# Patient Record
Sex: Female | Born: 1935
Health system: Southern US, Community
[De-identification: ages and names within clinical notes are randomized; demographics above are authoritative.]

## PROBLEM LIST (undated history)

## (undated) DIAGNOSIS — F419 Anxiety disorder, unspecified: Secondary | ICD-10-CM

## (undated) DIAGNOSIS — M810 Age-related osteoporosis without current pathological fracture: Secondary | ICD-10-CM

## (undated) DIAGNOSIS — K589 Irritable bowel syndrome without diarrhea: Secondary | ICD-10-CM

## (undated) DIAGNOSIS — J9610 Chronic respiratory failure, unspecified whether with hypoxia or hypercapnia: Secondary | ICD-10-CM

## (undated) DIAGNOSIS — I2510 Atherosclerotic heart disease of native coronary artery without angina pectoris: Secondary | ICD-10-CM

## (undated) DIAGNOSIS — I5032 Chronic diastolic (congestive) heart failure: Secondary | ICD-10-CM

## (undated) DIAGNOSIS — I219 Acute myocardial infarction, unspecified: Secondary | ICD-10-CM

## (undated) DIAGNOSIS — Z923 Personal history of irradiation: Secondary | ICD-10-CM

## (undated) DIAGNOSIS — J439 Emphysema, unspecified: Secondary | ICD-10-CM

## (undated) DIAGNOSIS — I480 Paroxysmal atrial fibrillation: Secondary | ICD-10-CM

## (undated) DIAGNOSIS — I10 Essential (primary) hypertension: Secondary | ICD-10-CM

## (undated) DIAGNOSIS — M5430 Sciatica, unspecified side: Secondary | ICD-10-CM

## (undated) DIAGNOSIS — Z8619 Personal history of other infectious and parasitic diseases: Secondary | ICD-10-CM

## (undated) DIAGNOSIS — M549 Dorsalgia, unspecified: Secondary | ICD-10-CM

## (undated) DIAGNOSIS — Z9889 Other specified postprocedural states: Secondary | ICD-10-CM

## (undated) DIAGNOSIS — M1990 Unspecified osteoarthritis, unspecified site: Secondary | ICD-10-CM

## (undated) DIAGNOSIS — Z87898 Personal history of other specified conditions: Secondary | ICD-10-CM

## (undated) DIAGNOSIS — K648 Other hemorrhoids: Secondary | ICD-10-CM

## (undated) DIAGNOSIS — I951 Orthostatic hypotension: Secondary | ICD-10-CM

## (undated) DIAGNOSIS — H3530 Unspecified macular degeneration: Secondary | ICD-10-CM

## (undated) DIAGNOSIS — Z860100 Personal history of colon polyps, unspecified: Secondary | ICD-10-CM

## (undated) DIAGNOSIS — J42 Unspecified chronic bronchitis: Secondary | ICD-10-CM

## (undated) DIAGNOSIS — G8929 Other chronic pain: Secondary | ICD-10-CM

## (undated) DIAGNOSIS — R112 Nausea with vomiting, unspecified: Secondary | ICD-10-CM

## (undated) DIAGNOSIS — I4510 Unspecified right bundle-branch block: Secondary | ICD-10-CM

## (undated) DIAGNOSIS — I671 Cerebral aneurysm, nonruptured: Secondary | ICD-10-CM

## (undated) DIAGNOSIS — K219 Gastro-esophageal reflux disease without esophagitis: Secondary | ICD-10-CM

## (undated) DIAGNOSIS — E785 Hyperlipidemia, unspecified: Secondary | ICD-10-CM

## (undated) DIAGNOSIS — E039 Hypothyroidism, unspecified: Secondary | ICD-10-CM

## (undated) DIAGNOSIS — Z9981 Dependence on supplemental oxygen: Secondary | ICD-10-CM

## (undated) DIAGNOSIS — C801 Malignant (primary) neoplasm, unspecified: Secondary | ICD-10-CM

## (undated) DIAGNOSIS — J189 Pneumonia, unspecified organism: Secondary | ICD-10-CM

## (undated) HISTORY — DX: Gastro-esophageal reflux disease without esophagitis: K21.9

## (undated) HISTORY — DX: Irritable bowel syndrome, unspecified: K58.9

## (undated) HISTORY — DX: Emphysema, unspecified: J43.9

## (undated) HISTORY — DX: Acute myocardial infarction, unspecified: I21.9

## (undated) HISTORY — PX: JOINT REPLACEMENT: SHX530

## (undated) HISTORY — PX: CATARACT EXTRACTION W/ INTRAOCULAR LENS  IMPLANT, BILATERAL: SHX1307

## (undated) HISTORY — DX: Hypothyroidism, unspecified: E03.9

## (undated) HISTORY — DX: Paroxysmal atrial fibrillation: I48.0

## (undated) HISTORY — PX: ESOPHAGOGASTRODUODENOSCOPY: SHX1529

## (undated) HISTORY — PX: CARDIAC CATHETERIZATION: SHX172

## (undated) HISTORY — PX: TONSILLECTOMY AND ADENOIDECTOMY: SUR1326

## (undated) HISTORY — DX: Anxiety disorder, unspecified: F41.9

## (undated) HISTORY — PX: OTHER SURGICAL HISTORY: SHX169

## (undated) HISTORY — PX: LARYNX SURGERY: SHX692

## (undated) HISTORY — DX: Chronic respiratory failure, unspecified whether with hypoxia or hypercapnia: J96.10

## (undated) HISTORY — DX: Atherosclerotic heart disease of native coronary artery without angina pectoris: I25.10

## (undated) HISTORY — DX: Orthostatic hypotension: I95.1

## (undated) HISTORY — DX: Other hemorrhoids: K64.8

## (undated) HISTORY — DX: Unspecified right bundle-branch block: I45.10

---

## 1971-02-06 HISTORY — PX: TUBAL LIGATION: SHX77

## 1984-02-06 HISTORY — PX: TOTAL ABDOMINAL HYSTERECTOMY: SHX209

## 1993-02-05 HISTORY — PX: APPENDECTOMY: SHX54

## 2000-07-05 ENCOUNTER — Inpatient Hospital Stay (HOSPITAL_COMMUNITY): Admission: AD | Admit: 2000-07-05 | Discharge: 2000-07-08 | Payer: Self-pay | Admitting: *Deleted

## 2000-07-08 ENCOUNTER — Encounter: Payer: Self-pay | Admitting: *Deleted

## 2000-08-19 ENCOUNTER — Ambulatory Visit (HOSPITAL_COMMUNITY): Admission: RE | Admit: 2000-08-19 | Discharge: 2000-08-19 | Payer: Self-pay | Admitting: Gastroenterology

## 2000-09-06 ENCOUNTER — Encounter: Payer: Self-pay | Admitting: Gastroenterology

## 2000-09-06 ENCOUNTER — Encounter: Admission: RE | Admit: 2000-09-06 | Discharge: 2000-09-06 | Payer: Self-pay | Admitting: Gastroenterology

## 2002-01-07 ENCOUNTER — Encounter: Admission: RE | Admit: 2002-01-07 | Discharge: 2002-01-07 | Payer: Self-pay | Admitting: Gastroenterology

## 2002-01-07 ENCOUNTER — Encounter: Payer: Self-pay | Admitting: Gastroenterology

## 2002-04-12 ENCOUNTER — Ambulatory Visit (HOSPITAL_COMMUNITY): Admission: RE | Admit: 2002-04-12 | Discharge: 2002-04-12 | Payer: Self-pay | Admitting: Family Medicine

## 2002-04-12 ENCOUNTER — Encounter: Payer: Self-pay | Admitting: Family Medicine

## 2002-05-14 ENCOUNTER — Ambulatory Visit (HOSPITAL_COMMUNITY): Admission: RE | Admit: 2002-05-14 | Discharge: 2002-05-14 | Payer: Self-pay | Admitting: *Deleted

## 2003-04-19 ENCOUNTER — Encounter: Admission: RE | Admit: 2003-04-19 | Discharge: 2003-04-19 | Payer: Self-pay | Admitting: Family Medicine

## 2003-05-01 ENCOUNTER — Emergency Department (HOSPITAL_COMMUNITY): Admission: EM | Admit: 2003-05-01 | Discharge: 2003-05-02 | Payer: Self-pay | Admitting: Emergency Medicine

## 2003-05-19 ENCOUNTER — Ambulatory Visit (HOSPITAL_COMMUNITY): Admission: RE | Admit: 2003-05-19 | Discharge: 2003-05-20 | Payer: Self-pay

## 2003-05-19 ENCOUNTER — Encounter (INDEPENDENT_AMBULATORY_CARE_PROVIDER_SITE_OTHER): Payer: Self-pay | Admitting: Specialist

## 2003-11-23 ENCOUNTER — Emergency Department (HOSPITAL_COMMUNITY): Admission: EM | Admit: 2003-11-23 | Discharge: 2003-11-23 | Payer: Self-pay | Admitting: Emergency Medicine

## 2004-02-02 ENCOUNTER — Encounter: Admission: RE | Admit: 2004-02-02 | Discharge: 2004-02-02 | Payer: Self-pay | Admitting: Specialist

## 2004-04-27 ENCOUNTER — Inpatient Hospital Stay (HOSPITAL_COMMUNITY): Admission: EM | Admit: 2004-04-27 | Discharge: 2004-05-01 | Payer: Self-pay | Admitting: Emergency Medicine

## 2004-08-04 ENCOUNTER — Emergency Department (HOSPITAL_COMMUNITY): Admission: EM | Admit: 2004-08-04 | Discharge: 2004-08-04 | Payer: Self-pay | Admitting: Emergency Medicine

## 2004-08-07 ENCOUNTER — Encounter (HOSPITAL_COMMUNITY): Admission: RE | Admit: 2004-08-07 | Discharge: 2004-09-07 | Payer: Self-pay | Admitting: Family Medicine

## 2004-09-15 ENCOUNTER — Ambulatory Visit: Payer: Self-pay | Admitting: Internal Medicine

## 2004-09-18 ENCOUNTER — Encounter: Admission: RE | Admit: 2004-09-18 | Discharge: 2004-09-18 | Payer: Self-pay | Admitting: Specialist

## 2005-05-31 ENCOUNTER — Encounter: Admission: RE | Admit: 2005-05-31 | Discharge: 2005-05-31 | Payer: Self-pay | Admitting: Specialist

## 2005-10-10 ENCOUNTER — Encounter: Admission: RE | Admit: 2005-10-10 | Discharge: 2005-10-10 | Payer: Self-pay | Admitting: Specialist

## 2005-12-10 ENCOUNTER — Emergency Department (HOSPITAL_COMMUNITY): Admission: EM | Admit: 2005-12-10 | Discharge: 2005-12-10 | Payer: Self-pay | Admitting: Emergency Medicine

## 2006-01-31 ENCOUNTER — Ambulatory Visit: Payer: Self-pay | Admitting: Internal Medicine

## 2006-02-05 HISTORY — PX: CHOLECYSTECTOMY: SHX55

## 2006-02-10 ENCOUNTER — Emergency Department (HOSPITAL_COMMUNITY): Admission: EM | Admit: 2006-02-10 | Discharge: 2006-02-10 | Payer: Self-pay | Admitting: Family Medicine

## 2006-02-28 ENCOUNTER — Ambulatory Visit: Payer: Self-pay | Admitting: Internal Medicine

## 2006-03-26 ENCOUNTER — Encounter: Admission: RE | Admit: 2006-03-26 | Discharge: 2006-03-26 | Payer: Self-pay | Admitting: Specialist

## 2006-08-01 ENCOUNTER — Encounter: Admission: RE | Admit: 2006-08-01 | Discharge: 2006-08-01 | Payer: Self-pay | Admitting: Specialist

## 2006-11-15 ENCOUNTER — Emergency Department (HOSPITAL_COMMUNITY): Admission: EM | Admit: 2006-11-15 | Discharge: 2006-11-15 | Payer: Self-pay | Admitting: Emergency Medicine

## 2006-12-04 ENCOUNTER — Encounter: Admission: RE | Admit: 2006-12-04 | Discharge: 2006-12-04 | Payer: Self-pay | Admitting: Specialist

## 2007-02-06 DIAGNOSIS — I219 Acute myocardial infarction, unspecified: Secondary | ICD-10-CM

## 2007-02-06 HISTORY — DX: Acute myocardial infarction, unspecified: I21.9

## 2007-04-22 ENCOUNTER — Encounter: Admission: RE | Admit: 2007-04-22 | Discharge: 2007-04-22 | Payer: Self-pay | Admitting: Specialist

## 2007-07-15 ENCOUNTER — Observation Stay (HOSPITAL_COMMUNITY): Admission: EM | Admit: 2007-07-15 | Discharge: 2007-07-16 | Payer: Self-pay | Admitting: Emergency Medicine

## 2007-07-17 ENCOUNTER — Encounter: Admission: RE | Admit: 2007-07-17 | Discharge: 2007-07-17 | Payer: Self-pay | Admitting: Interventional Cardiology

## 2007-09-02 ENCOUNTER — Encounter: Admission: RE | Admit: 2007-09-02 | Discharge: 2007-09-02 | Payer: Self-pay | Admitting: Specialist

## 2008-02-26 ENCOUNTER — Ambulatory Visit: Payer: Self-pay | Admitting: Internal Medicine

## 2008-03-11 ENCOUNTER — Encounter: Payer: Self-pay | Admitting: Internal Medicine

## 2008-03-11 ENCOUNTER — Ambulatory Visit: Payer: Self-pay | Admitting: Internal Medicine

## 2008-03-12 ENCOUNTER — Encounter: Payer: Self-pay | Admitting: Internal Medicine

## 2008-07-02 ENCOUNTER — Encounter: Admission: RE | Admit: 2008-07-02 | Discharge: 2008-07-02 | Payer: Self-pay | Admitting: Specialist

## 2008-08-27 ENCOUNTER — Encounter: Admission: RE | Admit: 2008-08-27 | Discharge: 2008-08-27 | Payer: Self-pay | Admitting: Orthopedic Surgery

## 2008-08-27 ENCOUNTER — Emergency Department (HOSPITAL_COMMUNITY): Admission: EM | Admit: 2008-08-27 | Discharge: 2008-08-28 | Payer: Self-pay | Admitting: Emergency Medicine

## 2008-11-01 ENCOUNTER — Inpatient Hospital Stay (HOSPITAL_COMMUNITY): Admission: RE | Admit: 2008-11-01 | Discharge: 2008-11-04 | Payer: Self-pay | Admitting: Orthopedic Surgery

## 2008-11-01 HISTORY — PX: TOTAL HIP ARTHROPLASTY: SHX124

## 2008-11-06 ENCOUNTER — Inpatient Hospital Stay (HOSPITAL_COMMUNITY): Admission: EM | Admit: 2008-11-06 | Discharge: 2008-11-07 | Payer: Self-pay | Admitting: Emergency Medicine

## 2008-11-06 ENCOUNTER — Encounter (INDEPENDENT_AMBULATORY_CARE_PROVIDER_SITE_OTHER): Payer: Self-pay | Admitting: Emergency Medicine

## 2008-11-06 ENCOUNTER — Ambulatory Visit: Payer: Self-pay | Admitting: Vascular Surgery

## 2009-03-08 ENCOUNTER — Ambulatory Visit (HOSPITAL_COMMUNITY): Admission: RE | Admit: 2009-03-08 | Discharge: 2009-03-08 | Payer: Self-pay | Admitting: Internal Medicine

## 2009-06-24 ENCOUNTER — Telehealth: Payer: Self-pay | Admitting: Internal Medicine

## 2009-06-27 ENCOUNTER — Ambulatory Visit: Payer: Self-pay | Admitting: Gastroenterology

## 2009-06-27 DIAGNOSIS — K219 Gastro-esophageal reflux disease without esophagitis: Secondary | ICD-10-CM | POA: Insufficient documentation

## 2009-06-27 DIAGNOSIS — R110 Nausea: Secondary | ICD-10-CM

## 2009-06-27 DIAGNOSIS — R1319 Other dysphagia: Secondary | ICD-10-CM

## 2009-06-27 DIAGNOSIS — Z860100 Personal history of colon polyps, unspecified: Secondary | ICD-10-CM | POA: Insufficient documentation

## 2009-06-27 DIAGNOSIS — R109 Unspecified abdominal pain: Secondary | ICD-10-CM

## 2009-06-27 LAB — CONVERTED CEMR LAB
ALT: 17 units/L (ref 0–35)
Albumin: 3.9 g/dL (ref 3.5–5.2)
BUN: 12 mg/dL (ref 6–23)
Calcium: 9.7 mg/dL (ref 8.4–10.5)
Chloride: 99 meq/L (ref 96–112)
Eosinophils Absolute: 0.1 10*3/uL (ref 0.0–0.7)
Glucose, Bld: 101 mg/dL — ABNORMAL HIGH (ref 70–99)
HCT: 44.2 % (ref 36.0–46.0)
Hemoglobin: 15.2 g/dL — ABNORMAL HIGH (ref 12.0–15.0)
Lymphocytes Relative: 35 % (ref 12.0–46.0)
Lymphs Abs: 2.8 10*3/uL (ref 0.7–4.0)
MCHC: 34.3 g/dL (ref 30.0–36.0)
Neutrophils Relative %: 54.9 % (ref 43.0–77.0)
Platelets: 343 10*3/uL (ref 150.0–400.0)
Potassium: 3.6 meq/L (ref 3.5–5.1)
RDW: 14.9 % — ABNORMAL HIGH (ref 11.5–14.6)
WBC: 8 10*3/uL (ref 4.5–10.5)

## 2009-06-29 ENCOUNTER — Inpatient Hospital Stay (HOSPITAL_COMMUNITY): Admission: EM | Admit: 2009-06-29 | Discharge: 2009-07-01 | Payer: Self-pay | Admitting: Emergency Medicine

## 2009-06-29 ENCOUNTER — Encounter: Payer: Self-pay | Admitting: Nurse Practitioner

## 2009-06-29 ENCOUNTER — Telehealth: Payer: Self-pay | Admitting: Nurse Practitioner

## 2009-06-30 ENCOUNTER — Encounter: Payer: Self-pay | Admitting: Cardiology

## 2009-07-20 ENCOUNTER — Telehealth: Payer: Self-pay | Admitting: Nurse Practitioner

## 2009-07-25 ENCOUNTER — Encounter: Admission: RE | Admit: 2009-07-25 | Discharge: 2009-07-25 | Payer: Self-pay | Admitting: Neurology

## 2009-08-30 ENCOUNTER — Ambulatory Visit: Payer: Self-pay | Admitting: Cardiothoracic Surgery

## 2009-09-19 ENCOUNTER — Encounter: Payer: Self-pay | Admitting: Cardiothoracic Surgery

## 2009-09-21 ENCOUNTER — Inpatient Hospital Stay (HOSPITAL_COMMUNITY)
Admission: RE | Admit: 2009-09-21 | Discharge: 2009-09-28 | Payer: Self-pay | Source: Home / Self Care | Admitting: Cardiothoracic Surgery

## 2009-09-21 ENCOUNTER — Ambulatory Visit: Payer: Self-pay | Admitting: Cardiothoracic Surgery

## 2009-09-21 HISTORY — PX: CORONARY ARTERY BYPASS GRAFT: SHX141

## 2009-09-22 ENCOUNTER — Ambulatory Visit: Payer: Self-pay | Admitting: Cardiology

## 2009-10-17 ENCOUNTER — Encounter: Admission: RE | Admit: 2009-10-17 | Discharge: 2009-10-17 | Payer: Self-pay | Admitting: Cardiothoracic Surgery

## 2009-10-17 ENCOUNTER — Ambulatory Visit: Payer: Self-pay | Admitting: Cardiothoracic Surgery

## 2009-10-24 ENCOUNTER — Ambulatory Visit: Payer: Self-pay | Admitting: Cardiology

## 2009-11-06 ENCOUNTER — Encounter (HOSPITAL_COMMUNITY)
Admission: RE | Admit: 2009-11-06 | Discharge: 2010-02-04 | Payer: Self-pay | Source: Home / Self Care | Attending: Cardiology | Admitting: Cardiology

## 2009-11-14 ENCOUNTER — Encounter: Payer: Self-pay | Admitting: Critical Care Medicine

## 2009-11-14 ENCOUNTER — Encounter: Admission: RE | Admit: 2009-11-14 | Discharge: 2009-11-14 | Payer: Self-pay | Admitting: Cardiology

## 2009-11-14 ENCOUNTER — Ambulatory Visit: Payer: Self-pay | Admitting: Cardiology

## 2009-12-01 ENCOUNTER — Ambulatory Visit: Payer: Self-pay | Admitting: Cardiovascular Disease

## 2009-12-07 ENCOUNTER — Ambulatory Visit: Payer: Self-pay | Admitting: Critical Care Medicine

## 2009-12-07 DIAGNOSIS — J449 Chronic obstructive pulmonary disease, unspecified: Secondary | ICD-10-CM | POA: Insufficient documentation

## 2009-12-07 DIAGNOSIS — I5032 Chronic diastolic (congestive) heart failure: Secondary | ICD-10-CM | POA: Insufficient documentation

## 2009-12-08 ENCOUNTER — Encounter: Payer: Self-pay | Admitting: Critical Care Medicine

## 2009-12-12 ENCOUNTER — Telehealth: Payer: Self-pay | Admitting: Critical Care Medicine

## 2009-12-12 ENCOUNTER — Telehealth (INDEPENDENT_AMBULATORY_CARE_PROVIDER_SITE_OTHER): Payer: Self-pay | Admitting: *Deleted

## 2009-12-13 ENCOUNTER — Encounter: Payer: Self-pay | Admitting: Critical Care Medicine

## 2009-12-16 ENCOUNTER — Telehealth: Payer: Self-pay | Admitting: Critical Care Medicine

## 2009-12-23 ENCOUNTER — Ambulatory Visit: Payer: Self-pay | Admitting: Cardiology

## 2009-12-26 ENCOUNTER — Encounter: Payer: Self-pay | Admitting: Critical Care Medicine

## 2009-12-30 ENCOUNTER — Encounter: Payer: Self-pay | Admitting: Critical Care Medicine

## 2010-01-05 ENCOUNTER — Encounter: Payer: Self-pay | Admitting: Critical Care Medicine

## 2010-01-12 ENCOUNTER — Ambulatory Visit: Payer: Self-pay | Admitting: Cardiology

## 2010-01-18 ENCOUNTER — Ambulatory Visit: Payer: Self-pay | Admitting: Critical Care Medicine

## 2010-01-23 ENCOUNTER — Telehealth (INDEPENDENT_AMBULATORY_CARE_PROVIDER_SITE_OTHER): Payer: Self-pay | Admitting: *Deleted

## 2010-02-05 ENCOUNTER — Encounter (HOSPITAL_COMMUNITY)
Admission: RE | Admit: 2010-02-05 | Discharge: 2010-03-07 | Payer: Self-pay | Source: Home / Self Care | Attending: Cardiology | Admitting: Cardiology

## 2010-02-26 ENCOUNTER — Encounter: Payer: Self-pay | Admitting: Gastroenterology

## 2010-03-08 ENCOUNTER — Encounter (HOSPITAL_COMMUNITY): Admission: RE | Admit: 2010-03-08 | Payer: Medicare Other | Source: Ambulatory Visit

## 2010-03-08 DIAGNOSIS — Z951 Presence of aortocoronary bypass graft: Secondary | ICD-10-CM | POA: Insufficient documentation

## 2010-03-08 DIAGNOSIS — F17200 Nicotine dependence, unspecified, uncomplicated: Secondary | ICD-10-CM | POA: Insufficient documentation

## 2010-03-08 DIAGNOSIS — I4891 Unspecified atrial fibrillation: Secondary | ICD-10-CM | POA: Insufficient documentation

## 2010-03-08 DIAGNOSIS — I200 Unstable angina: Secondary | ICD-10-CM | POA: Insufficient documentation

## 2010-03-08 DIAGNOSIS — Z96649 Presence of unspecified artificial hip joint: Secondary | ICD-10-CM | POA: Insufficient documentation

## 2010-03-08 DIAGNOSIS — Z7982 Long term (current) use of aspirin: Secondary | ICD-10-CM | POA: Insufficient documentation

## 2010-03-08 DIAGNOSIS — I2510 Atherosclerotic heart disease of native coronary artery without angina pectoris: Secondary | ICD-10-CM | POA: Insufficient documentation

## 2010-03-08 DIAGNOSIS — J438 Other emphysema: Secondary | ICD-10-CM | POA: Insufficient documentation

## 2010-03-08 DIAGNOSIS — Z7902 Long term (current) use of antithrombotics/antiplatelets: Secondary | ICD-10-CM | POA: Insufficient documentation

## 2010-03-08 DIAGNOSIS — K219 Gastro-esophageal reflux disease without esophagitis: Secondary | ICD-10-CM | POA: Insufficient documentation

## 2010-03-08 DIAGNOSIS — Z880 Allergy status to penicillin: Secondary | ICD-10-CM | POA: Insufficient documentation

## 2010-03-08 DIAGNOSIS — Z5189 Encounter for other specified aftercare: Secondary | ICD-10-CM | POA: Insufficient documentation

## 2010-03-08 DIAGNOSIS — I671 Cerebral aneurysm, nonruptured: Secondary | ICD-10-CM | POA: Insufficient documentation

## 2010-03-08 DIAGNOSIS — Z882 Allergy status to sulfonamides status: Secondary | ICD-10-CM | POA: Insufficient documentation

## 2010-03-08 DIAGNOSIS — I10 Essential (primary) hypertension: Secondary | ICD-10-CM | POA: Insufficient documentation

## 2010-03-09 NOTE — Progress Notes (Signed)
Summary: ONO low   Phone Note Outgoing Call   Summary of Call: call pt and tell her ONO on RA is abnormal   RA sat 77%   she needs overnite oxygen and with exertion. order sent to [facility] phone note initiated by Dr. Shan Levans on 12/12/09 at 12:00pm.  Follow-up for Phone Call        Pt returned call. Duplicate phone message was taken.  I called pt back and informed her of above per PW.  She was also informed to wear 2L o2 at night and 3L with exertion per orders sent to [facility].  Pt verbalized understanding and is aware order sent to [facility] for this. Follow-up by: Gweneth Dimitri RN,  December 12, 2009 2:43 PM

## 2010-03-09 NOTE — Progress Notes (Signed)
Summary: returning call  Phone Note Call from Patient Call back at Home Phone [phone]   Caller: Patient Call For: wright Summary of Call: Returning phone call. Initial call taken by: Darletta Moll,  December 12, 2009 1:53 PM  Follow-up for Phone Call        Pt was informed of ONO results and recs --- pls see phone note dated from 11.7.11 for additional information. Follow-up by: Gweneth Dimitri RN,  December 12, 2009 2:41 PM

## 2010-03-09 NOTE — Assessment & Plan Note (Signed)
Summary: burning in esophagus/stomach--ch.   History of Present Illness Visit Type: Initial Visit Primary GI MD: Yancey Flemings MD Primary Provider: Buren Kos, MD Requesting Provider: Buren Kos, MD Chief Complaint: Stomach burning History of Present Illness:   Patient is a 76 year old female followed by Dr. Marina Goodell for history of gastritis, colon polyps and constipation. She was last seen Feb. 2010. BMs have actually been doing better since starting Dexilant. Patient took Nexium once daily for three years for history of GERD. Saw PCP recently for several week history of generalized upper abdominal burning and nausea. At that time patient was switched from Nexium, which she took for years, to twice daily Dexilant. Her acid reflux has since improved and she thinks her nausea and upper abdominal burning have improved as well. No medication changes except for PPI.  Doesn't take NSAIDs. No black stools.  Complains of recent swallowing problems. Occasionally gets choked on meat. Recently had to extract some cole slaw. Pills seem to be getting stuck in her esophagus as well.    GI Review of Systems    Reports acid reflux, chest pain, heartburn, and  nausea.      Denies abdominal pain, belching, bloating, dysphagia with liquids, dysphagia with solids, loss of appetite, vomiting, vomiting blood, weight loss, and  weight gain.      Reports diverticulosis and  irritable bowel syndrome.     Denies anal fissure, black tarry stools, change in bowel habit, constipation, diarrhea, fecal incontinence, heme positive stool, hemorrhoids, jaundice, light color stool, liver problems, rectal bleeding, and  rectal pain. Preventive Screening-Counseling & Management  Alcohol-Tobacco     Smoking Status: current      Drug Use:  no.     Current Medications (verified): 1)  Hydrochlorothiazide 25 Mg Tabs (Hydrochlorothiazide) .[name].. 1 By Mouth Once Daily 2)  Dexilant 60 Mg Cpdr (Dexlansoprazole) .[name].. 1 By Mouth Two  Times A Day 3)  Fish Oil  Oil (Fish Oil) .[name].. 1 By Mouth Two Times A Day 4)  Calcium Citrate +  Tabs (Multiple Minerals-Vitamins) .[name].. 1 By Mouth Once Daily  Allergies (verified): 1)  ! Sulfa 2)  ! Penicillin 3)  ! Pyridium 4)  ! Vicodin 5)  ! 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  5 ciggs per day Alcohol Use - no Daily Caffeine Use  dr pepper  2 cups coffee per day Illicit Drug Use - no Smoking Status:  current Drug Use:  no  Review of Systems       The patient complains of allergy/sinus, anxiety-new, arthritis/joint pain, cough, fatigue, heart murmur, shortness of breath, sore throat, swelling of feet/legs, and urination changes/pain.  The patient denies anemia, back pain, blood in urine, breast changes/lumps, change in vision, confusion, coughing up blood, depression-new, fever, headaches-new, hearing problems, heart rhythm changes, itching, menstrual pain, muscle pains/cramps, night sweats, nosebleeds, pregnancy symptoms, skin rash, sleeping problems, swollen lymph glands, thirst - excessive , urination - excessive , urine leakage, vision changes, and voice change.    Vital Signs:  Patient profile:   76 year old female Height:  66 inches Weight:      154 pounds BMI:     24.95 BSA:     1.79 Pulse rate:   84 / minute Pulse rhythm:   regular BP sitting:   126 / 72  (left arm)  Vitals Entered By: Merri Ray CMA Duncan Dull) (Jun 27, 2009 10:36 AM)  Physical Exam  General:   Well developed, well nourished, no acute distress. Head:  Normocephalic and atraumatic. Eyes:  Conjunctiva pink, no icterus.  Mouth:  No oral lesions. Tongue moist.  Neck:  no obvious masses  Lungs:  Clear throughout to auscultation. Heart:  Regular rate and rhythm; no murmurs, rubs,  or bruits. Abdomen:  Abdomen soft, nondistended. Mild epigastric tenderness.  No obvious masses or hepatomegaly.Normal bowel sounds.  Msk:  Symmetrical with no gross deformities. Normal posture. Neurologic:  Alert and  oriented x4;  grossly normal neurologically. Skin:  Intact without significant lesions or rashes. Cervical Nodes:  No significant cervical adenopathy. Psych:  Alert and cooperative. Normal mood and affect.  Impression & Recommendations:  Problem # 1:  ABDOMINAL PAIN-EPIGASTRIC (ICD-789.06) Assessment Deteriorated Several week history of upper abdominal burning and nausea.  Improvement after switching from daily Nexium to twice daily Dexilant but out of the samples PCP gave her. Not sure why suddenly developed these symptoms few weeks ago. Doubt PUD. Symptoms do not sound typical for biliary disease and she has history of a cholecystectomy.  Will give patient prescription for Dexilant since it does seem to help. Will also give Zofran to use if needed. Obtain some basic labs today. If symptoms do not improve patient may need further workup (e.g. gastric emptying scan, EGD, etc...). It should be noted however that EGD (for epigastric pain and melena) in 2008 was negative except for mild acute gastritis.   Orders: TLB-CBC Platelet - w/Differential (85025-CBCD) TLB-CMP (Comprehensive Metabolic Pnl) (80053-COMP)  Problem # 2:  PERSONAL HX COLONIC POLYPS (ICD-V12.72) Tubular adenoma 2007  Problem # 3:  GERD (ICD-530.81) Recently switched to twice daily Dexilant. Some improvement in GERD symptoms.   Problem # 4:  NAUSEA (ICD-787.02) See #1.  Orders: TLB-CBC Platelet - w/Differential  (85025-CBCD) TLB-CMP (Comprehensive Metabolic Pnl) (80053-COMP)  Problem # 5:  DYSPHAGIA (ICD-787.29) Intermittent solid food dysphagia. May be secondary to esophagitis, dysmotility. Once GERD symptoms optimally controlled will reassess swallowing problems and proceed with EGD with dilation if indicated.  Other Orders: Ultrasound Abdomen (UAS)  Patient Instructions: 1)  Your physician has requested that you have the following labwork done today: Go to lab basement level. 2)  We sent a perscription for Zofran for nausea and Dexilant, samples given. 3)  Copy sent to: Buren Kos, MD 4)  The medication list was reviewed and reconciled.  All changed / newly prescribed medications were explained.  A complete medication list was provided to the patient / caregiver.  Prescriptions: DEXILANT 60 MG CPDR (DEXLANSOPRAZOLE) Take 1 tab twice daily, 30 min prior to breakfast and dinner  #60 x 2   Entered by:   Lowry Ram NCMA   Authorized by:   Willette Cluster NP   Signed by:   Lowry Ram NCMA on 06/27/2009   Method used:   Electronically to        [facility] [phone]* (retail)       [address]       [place], [place]  [address]       Ph: [phone]  Fax: [phone]   RxID:   [id number] ZOFRAN 4 MG TABS (ONDANSETRON HCL) Take 1 tab every 6 hours as needed for nausea  #40 x 0   Entered by:   Lowry Ram NCMA   Authorized by:   Willette Cluster NP   Signed by:   Lowry Ram NCMA on 06/27/2009   Method used:   Electronically to        [facility]. [phone]* (retail)       [address]       [place], [place]  [address]       Ph: [phone]       Fax: [phone]   RxID:   [id number]

## 2010-03-09 NOTE — Medication Information (Signed)
Summary: Orders / [facility] [place]  Orders / [facility]   Imported By: Lennie Odor 01/02/2010 12:28:34  _____________________________________________________________________  External Attachment:    Type:   Image     Comment:   External Document

## 2010-03-09 NOTE — Miscellaneous (Signed)
Summary: ONO on RA  Clinical Lists Changes  Observations: Added new observation of SLEEP STUDY: Low Oxygen Sat: 77% Oximetry overnight on          RA        =   11 min < 88%  (12/08/2009 9:01)      Sleep Study  Procedure date:  12/08/2009  Findings:      Low Oxygen Sat: 77% Oximetry overnight on          RA        =   11 min < 88%

## 2010-03-09 NOTE — Assessment & Plan Note (Signed)
Summary: Pulmonary Consultation   Visit Type:  Initial Consult Copy to:  Neva Seat Primary Provider/Referring Provider:  Buren Kos, MD  CC:  Pulmonary consult... and COPD initial evaluation.  History of Present Illness: Pulmonary Consultation       This is a 76 year old woman who presents for COPD initial evaluation.  The patient complains of history of diagnosed COPD, shortness of breath, wheezing, mucous production, and exercise induced symptoms, but denies chest tightness, chest pain worse with breathing and coughing, cough, nocturnal awakening, and congestion.  Prior evaluation and testing has included Cxr and Complete PFT.  The dyspnea is described as at rest, with ADL's, with walking within room, and during the day.  Associated disease(s) include(s) coronary artery disease, indigestion, and hand/feet swelling.  Oxygen evaluation is described as not on supplemental O2.  Prior effective treatment has included short acting beta agonists.    this pt has been dyspneic  since 09/2009.  The pt then had a  cabg and now is worse.  There is no real cough. The pt has been Dx with copd since 2010.  Only rx up to now has been as needed SABA.  Pt denies any significant sore throat, nasal congestion or excess secretions, fever, chills, sweats, unintended weight loss, pleurtic or exertional chest pain, orthopnea PND, or leg swelling Pt denies any increase in rescue therapy over baseline, denies waking up needing it or having any early am or nocturnal exacerbations of coughing/wheezing/or dyspnea. Pt also denies any obvious fluctuation in symptoms wiht weather or environmental change or other alleviating or aggravating factors   Preventive Screening-Counseling & Management  Alcohol-Tobacco     Year Started: 1961     Year Quit: 2011     Pack years: 20  Current Medications (verified): 1)  Fish Oil  Oil (Fish Oil) .[name].. 1 By Mouth Two Times A Day 2)  Calcium Citrate +  Tabs (Multiple  Minerals-Vitamins) .[name].. 1 By Mouth Once Daily 3)  Levothyroxine Sodium 25 Mcg Tabs (Levothyroxine Sodium) .[name].. 1 By Mouth Daily 4)  Furosemide 40 Mg Tabs (Furosemide) .[name].. 1 By Mouth Daily 5)  Klor-Con M20 20 Meq Cr-Tabs (Potassium Chloride Crys Cr) .[name].. 1 By Mouth Daily 6)  Aspirin 81 Mg Tabs (Aspirin) .[name].. 1 By Mouth Daily 7)  Nexium 40 Mg Cpdr (Esomeprazole Magnesium) .[name].. 1 By Mouth Daily 8)  Metoprolol Tartrate 25 Mg Tabs (Metoprolol Tartrate) .[name].. 1 By Mouth Two Times A Day 9)  Dulcolax Stool Softener 100 Mg Caps (Docusate Sodium) .... As Needed  Allergies (verified): 1)  ! Sulfa 2)  ! Penicillin 3)  ! Pyridium 4)  ! Vicodin 5)  ! 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 Family History of Breast Cancer: sister  brain tumor Family History of Colon Polyps:brother No FH of Colon Cancer: Family History MI/Heart Attack---father  Social History: Occupation: retired Engineer, civil (consulting) Alcohol Use - no Daily Caffeine Use  dr pepper  2 cups coffee per day Illicit Drug Use - no Pack years:  20  Review of Systems       The patient complains of shortness of breath with activity, shortness of breath at rest, productive cough, difficulty  swallowing, nasal congestion/difficulty breathing through nose, and hand/feet swelling.  The patient denies non-productive cough, coughing up blood, chest pain, irregular heartbeats, acid heartburn, indigestion, loss of appetite, weight change, abdominal pain, sore throat, tooth/dental problems, headaches, sneezing, itching, ear ache, anxiety, depression, joint  stiffness or pain, rash, change in color of mucus, and fever.        See HPI for Pulmonary, Cardiac, General, and ENT review of systems.  Vital Signs:  Patient profile:   76 year old female Height:      66 inches (167.64 cm) Weight:      153 pounds (69.55 kg) BMI:     24.78 O2 Sat:      98 % on Room air Temp:     97.6 degrees F (36.44 degrees C) oral Pulse rate:   89 / minute BP sitting:   104 / 68  (left arm) Cuff size:   regular  Vitals Entered By: Michel Bickers CMA (December 07, 2009 11:10 AM)  O2 Sat at Rest %:  98 O2 Flow:  Room air  Serial Vital Signs/Assessments:  Comments: 12:21 PM Ambulatory Pulse Oximetry  Resting; HR__83___    02 Sat_97% on room air____  Lap1 (185 feet)   HR__101___   02 Sat__90% on room air at 1/2 lap; patient had to sit and rest and c/o sob and dizziness; pt's sats were at 87% after 1 full lap and before oxygen could be started her sats were back to 96%.___ Lap2 (185 feet)   HR_____   02 Sat_____    Lap3 (185 feet)   HR_____   02 Sat_____  ___Test Completed without Difficulty ___Test Stopped due to:  By: Michel Bickers CMA   CC: Pulmonary consult..., COPD initial evaluation Comments Medications reviewed with patient Michel Bickers CMA  December 07, 2009 11:10 AM   Physical Exam  Additional Exam:  Gen: Pleasant, thin , WF in no distress,  normal affect ENT: No lesions,  mouth clear,  oropharynx clear, no postnasal drip Neck: No JVD, no TMG, no carotid bruits Lungs: No use of accessory muscles, no dullness to percussion,distant BS, poor airflow Cardiovascular: RRR, heart sounds normal, no murmur or gallops, no peripheral edema Abdomen: soft and NT, no HSM,  BS normal Musculoskeletal: No deformities, no cyanosis or clubbing Neuro: alert, non focal Skin: Warm, no lesions or rashes    Impression & Recommendations:  Problem # 1:  COPD (ICD-496) Assessment Deteriorated COPD with primary emphysematous component and low DLCO but well  preserved FeV1, pt with exertional desaturation and likely nocturnal desaturation plan Start Spiriva daily check ONO on RA as needed SABA may yet need oxygen at night and with exertion  Medications Added to Medication List This Visit: 1)  Levothyroxine Sodium 25 Mcg Tabs (Levothyroxine sodium) .[name].. 1 by mouth daily 2)  Furosemide 40 Mg Tabs (Furosemide) .[name].. 1 by mouth daily 3)  Klor-con M20 20 Meq Cr-tabs (Potassium chloride crys cr) .[name].. 1 by mouth daily 4)  Aspirin 81 Mg Tabs (Aspirin) .[name].. 1 by mouth daily 5)  Nexium 40 Mg Cpdr (Esomeprazole magnesium) .[name].. 1 by mouth daily 6)  Metoprolol Tartrate 25 Mg Tabs (Metoprolol tartrate) .[name].. 1 by mouth two times a day 7)  Dulcolax Stool Softener 100 Mg Caps (Docusate sodium) .... As needed 8)  Proair Hfa 108 (90 Base) Mcg/act Aers (Albuterol sulfate) .[name].. 1-2 puffs every 4-6 hours as needed 9)  Spiriva Handihaler 18 Mcg Caps (Tiotropium bromide monohydrate) .... Two puffs in handihaler  daily  Complete Medication List: 1)  Fish Oil Oil (Fish oil) .[name].. 1 by mouth two times a day 2)  Calcium Citrate + Tabs (Multiple minerals-vitamins) .[name].. 1 by mouth once daily 3)  Levothyroxine Sodium 25 Mcg Tabs (Levothyroxine sodium) .[name].. 1 by mouth daily 4)  Furosemide 40 Mg Tabs (Furosemide) .[name].. 1 by mouth daily 5)  Klor-con M20 20 Meq Cr-tabs (Potassium chloride crys cr) .[name].. 1 by mouth daily 6)  Aspirin 81 Mg Tabs (Aspirin) .[name].. 1 by mouth daily 7)  Nexium 40 Mg Cpdr (Esomeprazole magnesium) .[name].. 1 by mouth daily 8)  Metoprolol Tartrate 25 Mg Tabs (Metoprolol tartrate) .[name].. 1 by mouth two times a day 9)  Dulcolax Stool Softener 100 Mg Caps (Docusate sodium) .... As needed 10)  Proair Hfa 108 (90 Base) Mcg/act Aers (Albuterol sulfate) .[name].. 1-2 puffs every 4-6 hours as needed 11)  Spiriva Handihaler 18 Mcg Caps (Tiotropium bromide monohydrate) .... Two puffs in handihaler daily  Other Orders: Pulse Oximetry, Ambulatory ([address]) New Patient Level V  ([address]) DME Referral (DME)  Patient Instructions: 1)  Spiriva two puffs , one capsule daily 2)  Proair as needed 3)  An overnight sleep oximetry will be obtained. 4)  Return 6 weeks for recheck Prescriptions: PROAIR HFA 108 (90 BASE) MCG/ACT  AERS (ALBUTEROL SULFATE) 1-2 puffs every 4-6 hours as needed  #1 x 6   Entered and Authorized by:   Storm Frisk MD   Signed by:   Storm Frisk MD on 12/07/2009   Method used:   Electronically to        [facility]. [phone]* (retail)       [address]       [place], [place]  [address]       Ph: [phone]       Fax: [phone]   RxID:   [id number] SPIRIVA HANDIHALER 18 MCG  CAPS (TIOTROPIUM BROMIDE MONOHYDRATE) Two puffs in handihaler daily  #30 x 6   Entered and Authorized by:   Storm Frisk MD   Signed by:   Storm Frisk MD on 12/07/2009   Method used:   Electronically to        [facility]. [phone]* (retail)       [address]       [place], [place]  [address]       Ph: [phone]       Fax: [phone]   RxID:   [id number]   Appended Document: Pulmonary Consultation fax Eric Form

## 2010-03-09 NOTE — Progress Notes (Signed)
Summary: Triage  Phone Note Call from Patient Call back at Home Phone [phone]   Caller: Patient Call For: Dr. Marina Goodell Reason for Call: Talk to Nurse Summary of Call: pt. has an appt on 08-10-09 and requesting a sooner appt. She is taking Dexilant. She is having burning in esophagus and stomach Initial call taken by: Karna Christmas,  Jun 24, 2009 9:20 AM  Follow-up for Phone Call        Appt. changed to Monday morning with NP Follow-up by: Teryl Lucy RN,  Jun 24, 2009 10:25 AM

## 2010-03-09 NOTE — Progress Notes (Signed)
Summary: ? sprivia instructions  Phone Note From Pharmacy   Caller: [facility] [address] [phone] Call For: wright  Summary of Call: [facility] questioning sprivia directions.  Rx said 2 puffs a day, [facility] saying instructions never come like that.  Should it be 1 or 2 puffs a day? Initial call taken by: Lehman Prom,  January 23, 2010 1:07 PM  Follow-up for Phone Call        called and spoke with pharmacist and clarified directions of spiriva.  Aundra Millet Reynolds LPN  January 23, 2010 1:14 PM     New/Updated Medications: SPIRIVA HANDIHALER 18 MCG  CAPS (TIOTROPIUM BROMIDE MONOHYDRATE) Inhale contents of 1 capsule once a day

## 2010-03-09 NOTE — Miscellaneous (Signed)
Summary: [facility]   [facility]   Imported By: Lennie Odor 12/20/2009 12:03:12  _____________________________________________________________________  External Attachment:    Type:   Image     Comment:   External Document

## 2010-03-09 NOTE — Miscellaneous (Signed)
Summary: Dexilant 60 mg  Clinical Lists Changes  Medications: Changed medication from DEXILANT 60 MG CPDR (DEXLANSOPRAZOLE) Take 1 tab twice daily, 30 min prior to breakfast and dinner to DEXILANT 60 MG CPDR (DEXLANSOPRAZOLE) Take 1 tab once daily, 30 min prior to breakfast - Signed Rx of DEXILANT 60 MG CPDR (DEXLANSOPRAZOLE) Take 1 tab once daily, 30 min prior to breakfast;  #30 x 2;  Signed;  Entered by: Lowry Ram NCMA;  Authorized by: Willette Cluster NP;  Method used: Electronically to [facility]. [phone]*, [address], [place], [place], [place]  [address], Ph: [phone], Fax: [phone]    Prescriptions: DEXILANT 60 MG CPDR (DEXLANSOPRAZOLE) Take 1 tab once daily, 30 min prior to breakfast  #30 x 2   Entered by:   Lowry Ram NCMA   Authorized by:   Willette Cluster NP   Signed by:   Lowry Ram NCMA on 06/29/2009   Method used:   Electronically to        [facility] [phone]* (retail)       [address]       [place], [place]  [address]       Ph: [phone]       Fax: [phone]   RxID:   [id number]

## 2010-03-09 NOTE — Miscellaneous (Signed)
Summary: PFTs  Clinical Lists Changes  Observations: Added new observation of PFT COMMENTS: Minimal peripheral airflow obstruction, moderate reduction in DLCO consistent with emphysema, mild restriction in lung volumes, no response to bronchodilators (12/08/2009 9:55) Added new observation of DLCO/VA%EXP: 80 % (12/08/2009 9:55) Added new observation of DLCO/VA: 4.05 % (12/08/2009 9:55) Added new observation of DLCO % EXPEC: 50 % (12/08/2009 9:55) Added new observation of DLCO: 13.74 % (12/08/2009 9:55) Added new observation of RV % EXPECT: 82 % (12/08/2009 9:55) Added new observation of RV: 1.91 L (12/08/2009 9:55) Added new observation of TLC % EXPECT: 77 % (12/08/2009 9:55) Added new observation of TLC: 4.14 L (12/08/2009 9:55) Added new observation of FEF2575%EXPS: 68 % (12/08/2009 9:55) Added new observation of PSTFEF25/75P: 2.35  (12/08/2009 9:55) Added new observation of PSTFEF25/75%: 1.60 L/min (12/08/2009 9:55) Added new observation of FEV1FVCPRDPS: 76 % (12/08/2009 9:55) Added new observation of PSTFEV1/FVC: 79 % (12/08/2009 9:55) Added new observation of POSTFEV1%PRD: 83 % (12/08/2009 9:55) Added new observation of FEV1PRDPST: 1.77 L (12/08/2009 9:55) Added new observation of POST FEV1: 1.78 L/min (12/08/2009 9:55) Added new observation of POST FVC%EXP: 74 % (12/08/2009 9:55) Added new observation of FVCPRDPST: 3.01 L/min (12/08/2009 9:55) Added new observation of POST FVC: 2.25 L (12/08/2009 9:55) Added new observation of FEF % EXPEC: 64 % (12/08/2009 9:55) Added new observation of FEF25-75%PRE: 2.35 L/min (12/08/2009 9:55) Added new observation of FEF 25-75%: 1.51 L/min (12/08/2009 9:55) Added new observation of FEV1/FVC PRE: 76 % (12/08/2009 9:55) Added new observation of FEV1/FVC: 77 % (12/08/2009 9:55) Added new observation of FEV1 % EXP: 82 % (12/08/2009 9:55) Added new observation of FEV1 PREDICT: 2.14 L (12/08/2009 9:55) Added new observation of FEV1: 1.77 L  (12/08/2009 9:55) Added new observation of FVC % EXPECT: 76 % (12/08/2009 9:55) Added new observation of FVC PREDICT: 3.01 L (12/08/2009 9:55) Added new observation of FVC: 2.30 L (12/08/2009 9:55) Added new observation of PFT DATE: 03/08/2009  (12/08/2009 9:55)      Pulmonary Function Test Date: 03/08/2009 Height (in.): 66 Gender: Female  Pre-Spirometry FVC    Value: 2.30 L/min   Pred: 3.01 L/min     % Pred: 76 % FEV1    Value: 1.77 L     Pred: 2.14 L     % Pred: 82 % FEV1/FVC  Value: 77 %     Pred: 76 %    FEF 25-75  Value: 1.51 L/min   Pred: 2.35 L/min     % Pred: 64 %  Post-Spirometry FVC    Value: 2.25 L/min   Pred: 3.01 L/min     % Pred: 74 % FEV1    Value: 1.78 L     Pred: 1.77 L     % Pred: 83 % FEV1/FVC  Value: 79 %     Pred: 76 %    FEF 25-75  Value: 1.60 L/min   Pred: 2.35 L/min     % Pred: 68 %  Lung Volumes TLC    Value: 4.14 L   % Pred: 77 % RV    Value: 1.91 L   % Pred: 82 % DLCO    Value: 13.74 %   % Pred: 50 % DLCO/VA  Value: 4.05 %   % Pred: 80 %  Comments: Minimal peripheral airflow obstruction, moderate reduction in DLCO consistent with emphysema, mild restriction in lung volumes, no response to bronchodilators

## 2010-03-09 NOTE — Miscellaneous (Signed)
Summary: Echocardiogram  Clinical Lists Changes  Observations: Added new observation of ECHOINTERP: LVEF 55-60% RV systolic function normal Systolic RV pressure normal (06/30/2009 10:00)      Echocardiogram  Procedure date:  06/30/2009  Findings:      LVEF 55-60% RV systolic function normal Systolic RV pressure normal Peak PAP 

## 2010-03-09 NOTE — Progress Notes (Signed)
Summary: Cx ultrasound and update  Phone Note Outgoing Call Call back at [facility] Phone [phone]   Call placed by: Harlow Mares CMA Duncan Dull),  July 20, 2009 3:41 PM Call placed to: Patient Summary of Call: called pt and she states that she spoke with Gunnar Fusi and was advised since she was feeling better there was no need to have the ultrasound. There was no phone note of the conversation so I will forward this message to the [place]. Patient also wanted Gunnar Fusi to know that the day after she last spoke to [place] her had to go to the hospital with chest pain and she had a heart attack and is doing fine now.  Initial call taken by: Harlow Mares CMA Duncan Dull),  July 20, 2009 3:45 PM  Follow-up for Phone Call        Follow up call to patient. She is doing well from GERD standpoint on Dexilant. She has occasional angina relieved by Nitroglycerin. Patient will call for any recurrent GERD symptoms. Dexilant was approved by insurance, hopefully they will continue to pay. Follow-up by: Willette Cluster NP,  July 28, 2009 12:20 PM

## 2010-03-09 NOTE — Letter (Signed)
Summary: [facility]   Imported By: Sherian Rein 12/12/2009 13:36:11  _____________________________________________________________________  External Attachment:    Type:   Image     Comment:   External Document

## 2010-03-09 NOTE — Assessment & Plan Note (Signed)
Summary: Pulmonary OV   Copy to:  Neva Seat Primary Oza Oberle/Referring Vira Chaplin:  Buren Kos, MD  CC:  6 wk followup.  Pt states that her breathing has improved some since last seen.  She stopped the spiriva due to hoarsness.  No new complaints today.[name]  History of Present Illness: Pulmonary Consultation       This is a 76 year old woman who presents for COPD initial evaluation.  The patient complains of history of diagnosed COPD, shortness of breath, wheezing, mucous production, and exercise induced symptoms, but denies chest tightness, chest pain worse with breathing and coughing, cough, nocturnal awakening, and congestion.  Prior evaluation and testing has included Cxr and Complete PFT.  The dyspnea is described as at rest, with ADL's, with walking within room, and during the day.  Associated disease(s) include(s) coronary artery disease, indigestion, and hand/feet swelling.  Oxygen evaluation is described as not on supplemental O2.  Prior effective treatment has included short acting beta agonists.    this pt has been dyspneic  since 09/2009.  The pt then had a  cabg and now is worse.  There is no real cough. The pt has been Dx with copd since 2010.  Only rx up to now has been as needed SABA.  Pt denies any significant sore throat, nasal congestion or excess secretions, fever, chills, sweats, unintended weight loss, pleurtic or exertional chest pain, orthopnea PND, or leg swelling Pt denies any increase in rescue therapy over baseline, denies waking up needing it or having any early am or nocturnal exacerbations of coughing/wheezing/or dyspnea. Pt also denies any obvious fluctuation in symptoms wiht weather or environmental change or other alleviating or aggravating factors   January 18, 2010 1:52 PM The pt is now  on spiriva daily and oxygen therapy. The pt lives in a townhouse, now can walk without the oxygen. Pt likes the spiriva as far as lungs are concerned. Pt got hoarse on  spiriva but is taking too fast a breath on the inhaler    Preventive Screening-Counseling & Management  Alcohol-Tobacco     Smoking Status: current     Year Started: 1961     Year Quit: 2011     Pack years: 20  Current Medications (verified): 1)  Fish Oil 1000 Mg Caps (Omega-3 Fatty Acids) .[name].. 1 Three Times A Day 2)  Calcium Citrate +  Tabs (Multiple Minerals-Vitamins) .[name].. 1 By Mouth Once Daily 3)  Levothyroxine Sodium 25 Mcg Tabs (Levothyroxine Sodium) .[name].. 1 By Mouth Daily 4)  Furosemide 40 Mg Tabs (Furosemide) .[name].. 1 Two Times A Day 5)  Klor-Con M20 20 Meq Cr-Tabs (Potassium Chloride Crys Cr) .[name].. 1 By Mouth Daily 6)  Aspirin 81 Mg Tabs (Aspirin) .[name].. 1 By Mouth Daily 7)  Nexium 40 Mg Cpdr (Esomeprazole Magnesium) .[name].. 1 By Mouth Daily 8)  Metoprolol Tartrate 25 Mg Tabs (Metoprolol Tartrate) .[name].. 1 By Mouth Two Times A Day 9)  Dulcolax Stool Softener 100 Mg Caps (Docusate Sodium) .... As Needed 10)  Proair Hfa 108 (90 Base) Mcg/act  Aers (Albuterol Sulfate) .[name].. 1-2 Puffs Every 4-6 Hours As Needed 11)  Spiriva Handihaler 18 Mcg  Caps (Tiotropium Bromide Monohydrate) .... Two Puffs in Handihaler Daily 12)  Oxygen .... 2 Liters At Bedtime and 3 Liters With Exertion During The Day As Needed  Allergies (verified): 1)  ! Sulfa 2)  ! Penicillin 3)  ! Pyridium 4)  ! Morphine 5)  ! Prednisone 6)  ! * Pneumovax  Past History:  Past medical, surgical, family and social histories (including risk factors) reviewed, and no changes noted (except as noted below).  Past Medical History: Reviewed history from 12/07/2009 and no changes required. Current Problems:  CHF (ICD-428.0) DYSPHAGIA (ICD-787.29) GERD (ICD-530.81) PERSONAL HX COLONIC POLYPS (ICD-V12.72) Hx of atrial fibrilliation  Past Surgical History: Reviewed history from 12/07/2009 and no changes required. Appendectomy 1955 Cholecystectomy 2008 Hysterectomy  1986 Hip replacement (right) 11/01/2008  nodulant tumor  larynx  1963 Heart bypass  09/21/2009  Family History: Reviewed history from 12/07/2009 and no changes required. Family History of Breast Cancer: sister  brain tumor Family History of Colon Polyps:brother No FH of Colon Cancer: Family History MI/Heart Attack---father  Social History: Reviewed history from 12/07/2009 and no changes required. Occupation: retired Engineer, civil (consulting) Alcohol Use - no Daily Caffeine Use  dr pepper  2 cups coffee per day Illicit Drug Use - no  Review of Systems       The patient complains of shortness of breath with activity.  The patient denies shortness of breath at rest, productive cough, non-productive cough, coughing up blood, chest pain, irregular heartbeats, acid heartburn, indigestion, loss of appetite, weight change, abdominal pain, difficulty swallowing, sore throat, tooth/dental problems, headaches, nasal congestion/difficulty breathing through nose, sneezing, itching, ear ache, anxiety, depression, hand/feet swelling, joint stiffness or pain, rash, change in color of mucus, and fever.    Vital Signs:  Patient profile:   76 year old female Weight:      156 pounds O2 Sat:      91 % on Room air Temp:     97.4 degrees F oral Pulse rate:   89 / minute BP sitting:   120 / 82  (left arm)  Vitals Entered By: Vernie Murders (January 18, 2010 1:31 PM)  O2 Flow:  Room air  Physical Exam  Additional Exam:  Gen: Pleasant, thin , WF in no distress,  normal affect ENT: No lesions,  mouth clear,  oropharynx clear, no postnasal drip Neck: No JVD, no TMG, no carotid bruits Lungs: No use of accessory muscles, no dullness to percussion,distant BS, poor airflow Cardiovascular: RRR, heart sounds normal, no murmur or gallops, no peripheral edema Abdomen: soft and NT, no HSM,  BS normal Musculoskeletal: No deformities, no cyanosis or clubbing Neuro: alert, non focal Skin: Warm, no lesions or rashes    Impression & Recommendations:  Problem # 1:  COPD  (ICD-496) Assessment Unchanged  COPD with primary emphysematous component and low DLCO but well preserved FeV1, pt with exertional desaturation and likely nocturnal desaturation plan cont oxygen and spiriva slow down intake on spiriva   Medications Added to Medication List This Visit: 1)  Fish Oil 1000 Mg Caps (Omega-3 fatty acids) .[name].. 1 three times a day 2)  Furosemide 40 Mg Tabs (Furosemide) .[name].. 1 two times a day 3)  Oxygen  .... 2 liters at bedtime and 3 liters with exertion during the day as needed  Complete Medication List: 1)  Fish Oil 1000 Mg Caps (Omega-3 fatty acids) .[name].. 1 three times a day 2)  Calcium Citrate + Tabs (Multiple minerals-vitamins) .[name].. 1 by mouth once daily 3)  Levothyroxine Sodium 25 Mcg Tabs (Levothyroxine sodium) .[name].. 1 by mouth daily 4)  Furosemide 40 Mg Tabs (Furosemide) .[name].. 1 two times a day 5)  Klor-con M20 20 Meq Cr-tabs (Potassium chloride crys cr) .[name].. 1 by mouth daily 6)  Aspirin 81 Mg Tabs (Aspirin) .[name].. 1 by mouth daily 7)  Nexium 40 Mg  Cpdr (Esomeprazole magnesium) .[name].. 1 by mouth daily 8)  Metoprolol Tartrate 25 Mg Tabs (Metoprolol tartrate) .[name].. 1 by mouth two times a day 9)  Dulcolax Stool Softener 100 Mg Caps (Docusate sodium) .... As needed 10)  Proair Hfa 108 (90 Base) Mcg/act Aers (Albuterol sulfate) .[name].. 1-2 puffs every 4-6 hours as needed 11)  Spiriva Handihaler 18 Mcg Caps (Tiotropium bromide monohydrate) .... Two puffs in handihaler daily 12)  Oxygen  .... 2 liters at bedtime and 3 liters with exertion during the day as needed  Other Orders: Est. Patient Level III ([address])  Patient Instructions: 1)  Use spiriva daily, remember to use the device with a slow deep breath 2)  Stay on oxygen as you are currently using 3)  Return 3 months  Appended Document: Pulmonary OV doug shaw fax

## 2010-03-09 NOTE — Progress Notes (Signed)
Summary: O2 questions  Phone Note Call from Patient Call back at [facility] Phone [phone]   Caller: Patient Call For: Renee [name] Summary of Call: Has questions about her O2. Initial call taken by: Darletta Moll,  December 16, 2009 4:09 PM  Follow-up for Phone Call        called and spoke with pt and she just wanted to review the correct way to use the oxygen---i explained to her per PW orders for DME---2 liters at night and 3 liters with exertion.  pt voiced her understanding of this [address] [place]  December 16, 2009 4:19 PM

## 2010-03-09 NOTE — Progress Notes (Signed)
Summary: Dexilant  Phone Note Outgoing Call   Call placed by: Joselyn Glassman,  Jun 29, 2009 10:27 AM Call placed to: Insurer Summary of Call: I spoke to [employer].  Yesterday 06-28-09 I tried to do PA for twice daily dosage of Dexilant 60 Mg.  They approved it but for 30 MG.  I  called today and wanted to redo the PA for once daily, 60 MG.  The rep I spoke to said I really didn't have to do a PA on it.  Her plan would cover 60 MG  for 90 capsules in a 150 day period of time.  I will let pt know she can get the Dexilant for now.   Initial call taken by: Joselyn Glassman,  Jun 29, 2009 10:29 AM

## 2010-03-10 ENCOUNTER — Encounter (HOSPITAL_COMMUNITY): Payer: Medicare Other

## 2010-03-10 NOTE — Letter (Signed)
Summary: CMN for Oxygen / [facility]  CMN for Oxygen / [facility]   Imported By: Lennie Odor 01/05/2010 15:32:57  _____________________________________________________________________  External Attachment:    Type:   Image     Comment:   External Document

## 2010-03-10 NOTE — Letter (Signed)
Summary: [facility]N for Oxygen / [facility]  CMN for Oxygen / [facility]   Imported By: Lennie Odor 01/11/2010 10:42:32  _____________________________________________________________________  External Attachment:    Type:   Image     Comment:   External Document

## 2010-03-13 ENCOUNTER — Encounter (HOSPITAL_COMMUNITY): Payer: Medicare Other

## 2010-03-13 ENCOUNTER — Encounter: Payer: Self-pay | Admitting: Critical Care Medicine

## 2010-03-13 ENCOUNTER — Ambulatory Visit (INDEPENDENT_AMBULATORY_CARE_PROVIDER_SITE_OTHER): Payer: Medicare Other | Admitting: Critical Care Medicine

## 2010-03-13 DIAGNOSIS — J018 Other acute sinusitis: Secondary | ICD-10-CM

## 2010-03-13 DIAGNOSIS — J449 Chronic obstructive pulmonary disease, unspecified: Secondary | ICD-10-CM

## 2010-03-15 ENCOUNTER — Encounter (HOSPITAL_COMMUNITY): Payer: Medicare Other

## 2010-03-17 ENCOUNTER — Encounter (HOSPITAL_COMMUNITY): Payer: Medicare Other

## 2010-03-20 ENCOUNTER — Encounter (HOSPITAL_COMMUNITY): Payer: Medicare Other

## 2010-03-22 ENCOUNTER — Encounter (HOSPITAL_COMMUNITY): Payer: Medicare Other

## 2010-03-23 NOTE — Assessment & Plan Note (Signed)
Summary: Pulmonary OV   Visit Type:  Follow-up Copy to:  Neva Seat Primary Provider/Referring Provider:  Buren Kos, MD  CC:  COPD.  pt c/o incr SOB due to anxiety...pt hit the left side of her head on 03/06/2010 and c/o headaches.  History of Present Illness: Pulmonary OV       This is a 76 year old woman who presents for COPD    this pt has been dyspneic  since 09/2009.  The pt then had a  cabg and now is worse.  There is no real cough. The pt has been Dx with copd since 2010.  Only rx up to now has been as needed SABA.  Pt denies any significant sore throat, nasal congestion or excess secretions, fever, chills, sweats, unintended weight loss, pleurtic or exertional chest pain, orthopnea PND, or leg swelling Pt denies any increase in rescue therapy over baseline, denies waking up needing it or having any early am or nocturnal exacerbations of coughing/wheezing/or dyspnea. Pt also denies any obvious fluctuation in symptoms wiht weather or environmental change or other alleviating or aggravating factors   January 18, 2010 1:52 PM The pt is now  on spiriva daily and oxygen therapy. The pt lives in a townhouse, now can walk without the oxygen. Pt likes the spiriva as far as lungs are concerned. Pt got hoarse on spiriva but is taking too fast a breath on the inhaler  March 13, 2010 11:48 AM Pt is improved.  Pt notes some soreness in the throat.  There is no cough. THere is no excess mucus.  The spiriva causes hoarseness. Pt denies any significant sore throat, nasal congestion or excess secretions, fever, chills, sweats, unintended weight loss, pleurtic or exertional chest pain, orthopnea PND, or leg swelling Pt denies any increase in rescue therapy over baseline, denies waking up needing it or having any early am or nocturnal exacerbations of coughing/wheezing/or dyspnea.   Preventive Screening-Counseling & Management  Alcohol-Tobacco     Smoking Status: quit     Packs/Day:  0.75     Year Started: 1960     Year Quit: August 2011  Current Medications (verified): 1)  Fish Oil 1000 Mg Caps (Omega-3 Fatty Acids) .[name].. 1 Three Times A Day 2)  Calcium Citrate +  Tabs (Multiple Minerals-Vitamins) .[name].. 1 By Mouth Once Daily 3)  Levothyroxine Sodium 25 Mcg Tabs (Levothyroxine Sodium) .[name].. 1 By Mouth Daily 4)  Furosemide 40 Mg Tabs (Furosemide) .[name].. 1 Two Times A Day 5)  Klor-Con M20 20 Meq Cr-Tabs (Potassium Chloride Crys Cr) .[name].. 1 By Mouth Daily 6)  Aspirin 81 Mg Tabs (Aspirin) .[name].. 1 By Mouth Daily 7)  Nexium 40 Mg Cpdr (Esomeprazole Magnesium) .[name].. 1 By Mouth Daily 8)  Metoprolol Tartrate 25 Mg Tabs (Metoprolol Tartrate) .[name].. 1 By Mouth Two Times A Day 9)  Dulcolax Stool Softener 100 Mg Caps (Docusate Sodium) .... As Needed 10)  Proair Hfa 108 (90 Base) Mcg/act  Aers (Albuterol Sulfate) .[name].. 1-2 Puffs Every 4-6 Hours As Needed 11)  Spiriva Handihaler 18 Mcg  Caps (Tiotropium Bromide Monohydrate) .... Inhale Contents of 1 Capsule Once A Day 12)  Oxygen .... 2 Liters At Bedtime and 3 Liters With Exertion During The Day As Needed  Allergies (verified): 1)  ! Sulfa 2)  ! Penicillin 3)  ! Pyridium 4)  ! Morphine 5)  ! Prednisone 6)  ! * Pneumovax  Past History:  Past medical, surgical, family and social histories (including risk factors)  reviewed, and no changes noted (except as noted below).  Past Medical History: Reviewed history from 12/07/2009 and no changes required. Current Problems:  CHF (ICD-428.0) DYSPHAGIA (ICD-787.29) GERD (ICD-530.81) PERSONAL HX COLONIC POLYPS (ICD-V12.72) Hx of atrial fibrilliation  Past Surgical History: Reviewed history from 12/07/2009 and no changes required. Appendectomy 1955 Cholecystectomy 2008 Hysterectomy  1986 Hip replacement (right) 11/01/2008  nodulant tumor larynx  1963 Heart bypass  09/21/2009  Clinical Reports Reviewed:  PFT's:  12/08/2009: DLCO %Predicted:  50 FEF 25/75 %Predicted:  64 FEV1  %Predicted:  82 FVC %Predicted:  76 Post Spirometry FEF 25/75 %Predicted:  68 Post Spirometry FEV1 %Predicted:  83 Post Spirometry FVC %Predicted:  74 RV %Predicted:  82 TLC %Predicted:  77   Family History: Reviewed history from 12/07/2009 and no changes required. Family History of Breast Cancer: sister  brain tumor Family History of Colon Polyps:brother No FH of Colon Cancer: Family History MI/Heart Attack---father  Social History: Reviewed history from 12/07/2009 and no changes required. Occupation: retired Engineer, civil (consulting) Alcohol Use - no Daily Caffeine Use  dr pepper  2 cups coffee per day Illicit Drug Use - no Smoking Status:  quit Packs/Day:  0.75  Review of Systems       The patient complains of shortness of breath with activity.  The patient denies shortness of breath at rest, productive cough, non-productive cough, coughing up blood, chest pain, irregular heartbeats, acid heartburn, indigestion, loss of appetite, weight change, abdominal pain, difficulty swallowing, sore throat, tooth/dental problems, headaches, nasal congestion/difficulty breathing through nose, sneezing, itching, ear ache, anxiety, depression, hand/feet swelling, joint stiffness or pain, rash, change in color of mucus, and fever.    Vital Signs:  Patient profile:   76 year old female Height:      66 inches (167.64 cm) Weight:      157.13 pounds (71.42 kg) BMI:     25.45 O2 Sat:      98 % on Room air Temp:     97.9 degrees F (36.61 degrees C) oral Pulse rate:   68 / minute BP sitting:   106 / 64  (left arm) Cuff size:   regular  Vitals Entered By: Michel Bickers CMA (March 13, 2010 11:24 AM)  O2 Sat at Rest %:  98 O2 Flow:  Room air CC: COPD.  pt c/o incr SOB due to anxiety...pt hit the left side of her head on 03/06/2010 and c/o headaches Comments Medications reviewed with patient Michel Bickers CMA  March 13, 2010 11:33 AM   Physical Exam  Additional Exam:  Gen: Pleasant, thin , WF in no  distress,  normal affect ENT: No lesions,  mouth clear,  oropharynx clear, no postnasal drip Neck: No JVD, no TMG, no carotid bruits Lungs: No use of accessory muscles, no dullness to percussion,distant BS, poor airflow Cardiovascular: RRR, heart sounds normal, no murmur or gallops, no peripheral edema Abdomen: soft and NT, no HSM,  BS normal Musculoskeletal: No deformities, no cyanosis or clubbing Neuro: alert, non focal Skin: Warm, no lesions or rashes    Impression & Recommendations:  Problem # 1:  COPD (ICD-496) Assessment Unchanged COPD with primary emphysematous component and low DLCO but well preserved FeV1, pt with exertional desaturation and likely nocturnal desaturation plan cont oxygen and spiriva  Medications Added to Medication List This Visit: 1)  Azithromycin 250 Mg Tabs (Azithromycin) .... Take two by mouth times one dose then one daily until gone 2)  Nasonex 50 Mcg/act Susp (Mometasone furoate) .Marland KitchenMarland KitchenMarland Kitchen  Two puffs each nostril daily take until sample gone  Complete Medication List: 1)  Fish Oil 1000 Mg Caps (Omega-3 fatty acids) .[name].. 1 three times a day 2)  Calcium Citrate + Tabs (Multiple minerals-vitamins) .[name].. 1 by mouth once daily 3)  Levothyroxine Sodium 25 Mcg Tabs (Levothyroxine sodium) .[name].. 1 by mouth daily 4)  Furosemide 40 Mg Tabs (Furosemide) .[name].. 1 two times a day 5)  Klor-con M20 20 Meq Cr-tabs (Potassium chloride crys cr) .[name].. 1 by mouth daily 6)  Aspirin 81 Mg Tabs (Aspirin) .[name].. 1 by mouth daily 7)  Nexium 40 Mg Cpdr (Esomeprazole magnesium) .[name].. 1 by mouth daily 8)  Metoprolol Tartrate 25 Mg Tabs (Metoprolol tartrate) .[name].. 1 by mouth two times a day 9)  Dulcolax Stool Softener 100 Mg Caps (Docusate sodium) .... As needed 10)  Proair Hfa 108 (90 Base) Mcg/act Aers (Albuterol sulfate) .[name].. 1-2 puffs every 4-6 hours as needed 11)  Spiriva Handihaler 18 Mcg Caps (Tiotropium bromide monohydrate) .... Inhale contents of 1 capsule once a day 12)  Oxygen  ....  2 liters at bedtime and 3 liters with exertion during the day as needed 13)  Azithromycin 250 Mg Tabs (Azithromycin) .... Take two by mouth times one dose then one daily until gone 14)  Nasonex 50 Mcg/act Susp (Mometasone furoate) .... Two puffs each nostril daily take until sample gone  Other Orders: Est. Patient Level IV (29562) Surgical Referral (Surgery) Rehabilitation Referral (Rehab)  Patient Instructions: 1)  I will ask Dr Tyrone Sage to see you for your sternal wound pain 2)  Pulmonary rehab will be ordered 3)  zpak will be sent to the pharmacy 4)  Nasonex two sprays each nostril daily until samples gone 5)  No change in medications 6)  Return in     4     months Prescriptions: AZITHROMYCIN 250 MG TABS (AZITHROMYCIN) take two by mouth times one dose then one daily until gone  #7 x 0   Entered and Authorized by:   Storm Frisk MD   Signed by:   Storm Frisk MD on 03/13/2010   Method used:   Electronically to        [facility]. [phone]* (retail)       [address]       [place], [place]  [address]       Ph: [phone]       Fax: [phone]   RxID:   [id number]    Immunization History:  Influenza Immunization History:    Influenza:  historical (10/06/2009)  Pneumovax Immunization History:    Pneumovax:  historical (02/06/2007)   Appended Document: Pulmonary OV PT with mild sinus inflammation>>>rx zpak. Refer back to pulm rehab Pt wiht soreness over ant chest at site of cabg sternotomy>>>refer back to Dr Tyrone Sage   fax Dr Tyrone Sage and Eric Form

## 2010-03-24 ENCOUNTER — Encounter (HOSPITAL_COMMUNITY): Payer: Medicare Other

## 2010-03-27 ENCOUNTER — Encounter (HOSPITAL_COMMUNITY): Payer: Medicare Other

## 2010-03-29 ENCOUNTER — Other Ambulatory Visit: Payer: Self-pay | Admitting: Cardiothoracic Surgery

## 2010-03-29 ENCOUNTER — Encounter (HOSPITAL_COMMUNITY): Payer: Medicare Other

## 2010-03-29 DIAGNOSIS — I2510 Atherosclerotic heart disease of native coronary artery without angina pectoris: Secondary | ICD-10-CM

## 2010-03-30 ENCOUNTER — Ambulatory Visit (INDEPENDENT_AMBULATORY_CARE_PROVIDER_SITE_OTHER): Payer: Medicare Other | Admitting: Cardiothoracic Surgery

## 2010-03-30 ENCOUNTER — Ambulatory Visit
Admission: RE | Admit: 2010-03-30 | Discharge: 2010-03-30 | Disposition: A | Discharge status: Home / Self Care | Payer: Medicare Other | Source: Ambulatory Visit | Attending: Cardiothoracic Surgery | Admitting: Cardiothoracic Surgery

## 2010-03-30 DIAGNOSIS — I2510 Atherosclerotic heart disease of native coronary artery without angina pectoris: Secondary | ICD-10-CM

## 2010-03-30 DIAGNOSIS — R071 Chest pain on breathing: Secondary | ICD-10-CM

## 2010-03-31 ENCOUNTER — Encounter: Payer: Self-pay | Admitting: Critical Care Medicine

## 2010-03-31 ENCOUNTER — Encounter (HOSPITAL_COMMUNITY): Payer: Medicare Other

## 2010-03-31 NOTE — Assessment & Plan Note (Addendum)
OFFICE VISIT  Renee [name], Renee [name] DOB:  08-01-1934                                        March 30, 2010 CHART #:  [id number]  The patient underwent coronary artery bypass grafting off-pump x2 on September 21, 2009.  She has known significant underlying pulmonary disease and was initially treated medically, but because of refractory symptoms underwent coronary artery bypass grafting which she tolerated reasonably well, which was done on September 21, 2009.  Since that time, she has had continued deterioration in her pulmonary function and is followed by Dr. Delford Field.  She is now on oxygen at night and p.r.n.  She comes to the office today because of some complaint of increasing sensitivity of the lower neck just above the sternal notch.  PHYSICAL EXAMINATION:  On exam, her blood pressure is 125/82, pulse is 88, respiratory rate is 16, O2 sats 97%.  On exam, her breath sounds are distant.  She has no active wheezing.  Examination of the sternum reveals the bone to be intact without movement.  She has a very small keloid at the very upper portion of her sternal incision with some wound contracture with area of tent skin moving up the midline of her neck from the incision, but was not previously incision.  She has hyperesthesias in this area.  Follow up chest x-ray done today shows the sternal wires all to be intact and without movement.  IMPRESSION:  The patient with small upper sternal keloid with some contracture of the skin extending up into the neck with increasing discomfort.  At this point, I have discussed with her, a referral to Plastic Surgery to evaluate her situation and potentially re-excision of the scar with other possible maneuvers to prevent further keloid formation.  At this point, the patient's sternum itself is stable and is not the cause of her hyperesthesias.  Sheliah Plane, MD Electronically Signed  EG/MEDQ  D:  03/30/2010  T:   03/30/2010  Job:  [id number]  cc:   Kari Baars, MD Peter M. [place], MD Yaakov Guthrie Shon Hough, M.D.

## 2010-04-03 ENCOUNTER — Encounter (HOSPITAL_COMMUNITY): Payer: Medicare Other

## 2010-04-05 ENCOUNTER — Encounter (HOSPITAL_COMMUNITY): Payer: Medicare Other

## 2010-04-06 ENCOUNTER — Encounter (HOSPITAL_COMMUNITY): Payer: Self-pay

## 2010-04-07 ENCOUNTER — Encounter (HOSPITAL_COMMUNITY): Payer: Self-pay

## 2010-04-10 ENCOUNTER — Encounter (HOSPITAL_COMMUNITY): Payer: Self-pay

## 2010-04-11 ENCOUNTER — Encounter (HOSPITAL_COMMUNITY): Payer: Self-pay

## 2010-04-12 ENCOUNTER — Encounter (HOSPITAL_COMMUNITY): Payer: Self-pay

## 2010-04-13 ENCOUNTER — Encounter (HOSPITAL_COMMUNITY): Payer: Self-pay

## 2010-04-14 ENCOUNTER — Encounter (HOSPITAL_COMMUNITY): Payer: Self-pay

## 2010-04-17 ENCOUNTER — Encounter (HOSPITAL_COMMUNITY): Payer: Self-pay

## 2010-04-18 ENCOUNTER — Encounter (HOSPITAL_COMMUNITY): Payer: Self-pay | Attending: Critical Care Medicine

## 2010-04-18 NOTE — Miscellaneous (Signed)
Summary: Order/Pulmonary/[facility]  Order/Pulmonary/[facility]   Imported By: Lester [place] 04/14/2010 10:45:09  _____________________________________________________________________  External Attachment:    Type:   Image     Comment:   External Document

## 2010-04-19 ENCOUNTER — Encounter (HOSPITAL_COMMUNITY): Payer: Self-pay

## 2010-04-20 ENCOUNTER — Encounter (HOSPITAL_COMMUNITY): Payer: Self-pay

## 2010-04-20 LAB — CBC
HCT: 31.7 % — ABNORMAL LOW (ref 36.0–46.0)
HCT: 33.4 % — ABNORMAL LOW (ref 36.0–46.0)
HCT: 34.3 % — ABNORMAL LOW (ref 36.0–46.0)
HCT: 34.8 % — ABNORMAL LOW (ref 36.0–46.0)
HCT: 35.2 % — ABNORMAL LOW (ref 36.0–46.0)
HCT: 35.4 % — ABNORMAL LOW (ref 36.0–46.0)
HCT: 35.8 % — ABNORMAL LOW (ref 36.0–46.0)
HCT: 44.2 % (ref 36.0–46.0)
Hemoglobin: 10.1 g/dL — ABNORMAL LOW (ref 12.0–15.0)
Hemoglobin: 10.9 g/dL — ABNORMAL LOW (ref 12.0–15.0)
Hemoglobin: 11.1 g/dL — ABNORMAL LOW (ref 12.0–15.0)
Hemoglobin: 11.3 g/dL — ABNORMAL LOW (ref 12.0–15.0)
Hemoglobin: 11.4 g/dL — ABNORMAL LOW (ref 12.0–15.0)
Hemoglobin: 11.6 g/dL — ABNORMAL LOW (ref 12.0–15.0)
Hemoglobin: 11.6 g/dL — ABNORMAL LOW (ref 12.0–15.0)
Hemoglobin: 14.9 g/dL (ref 12.0–15.0)
MCH: 28.9 pg (ref 26.0–34.0)
MCH: 29.3 pg (ref 26.0–34.0)
MCH: 29.3 pg (ref 26.0–34.0)
MCH: 29.4 pg (ref 26.0–34.0)
MCH: 29.4 pg (ref 26.0–34.0)
MCH: 29.7 pg (ref 26.0–34.0)
MCH: 30.2 pg (ref 26.0–34.0)
MCH: 30.8 pg (ref 26.0–34.0)
MCHC: 31.8 g/dL (ref 30.0–36.0)
MCHC: 31.9 g/dL (ref 30.0–36.0)
MCHC: 32.4 g/dL (ref 30.0–36.0)
MCHC: 32.4 g/dL (ref 30.0–36.0)
MCHC: 32.5 g/dL (ref 30.0–36.0)
MCHC: 32.8 g/dL (ref 30.0–36.0)
MCHC: 33.2 g/dL (ref 30.0–36.0)
MCHC: 33.7 g/dL (ref 30.0–36.0)
MCV: 89 fL (ref 78.0–100.0)
MCV: 90.4 fL (ref 78.0–100.0)
MCV: 90.5 fL (ref 78.0–100.0)
MCV: 90.5 fL (ref 78.0–100.0)
MCV: 90.8 fL (ref 78.0–100.0)
MCV: 91.3 fL (ref 78.0–100.0)
MCV: 92.4 fL (ref 78.0–100.0)
MCV: 92.5 fL (ref 78.0–100.0)
Platelets: 178 10*3/uL (ref 150–400)
Platelets: 193 10*3/uL (ref 150–400)
Platelets: 195 10*3/uL (ref 150–400)
Platelets: 217 10*3/uL (ref 150–400)
Platelets: 232 10*3/uL (ref 150–400)
Platelets: 235 10*3/uL (ref 150–400)
Platelets: 239 10*3/uL (ref 150–400)
Platelets: 298 10*3/uL (ref 150–400)
RBC: 3.43 MIL/uL — ABNORMAL LOW (ref 3.87–5.11)
RBC: 3.68 MIL/uL — ABNORMAL LOW (ref 3.87–5.11)
RBC: 3.71 MIL/uL — ABNORMAL LOW (ref 3.87–5.11)
RBC: 3.89 MIL/uL (ref 3.87–5.11)
RBC: 3.91 MIL/uL (ref 3.87–5.11)
RBC: 3.91 MIL/uL (ref 3.87–5.11)
RBC: 3.96 MIL/uL (ref 3.87–5.11)
RBC: 4.84 MIL/uL (ref 3.87–5.11)
RDW: 14 % (ref 11.5–15.5)
RDW: 14.1 % (ref 11.5–15.5)
RDW: 14.2 % (ref 11.5–15.5)
RDW: 14.3 % (ref 11.5–15.5)
RDW: 14.6 % (ref 11.5–15.5)
RDW: 14.7 % (ref 11.5–15.5)
RDW: 15 % (ref 11.5–15.5)
RDW: 15.1 % (ref 11.5–15.5)
WBC: 10.7 10*3/uL — ABNORMAL HIGH (ref 4.0–10.5)
WBC: 10.8 10*3/uL — ABNORMAL HIGH (ref 4.0–10.5)
WBC: 11.6 10*3/uL — ABNORMAL HIGH (ref 4.0–10.5)
WBC: 6.6 10*3/uL (ref 4.0–10.5)
WBC: 7.9 10*3/uL (ref 4.0–10.5)
WBC: 8.6 10*3/uL (ref 4.0–10.5)
WBC: 9.5 10*3/uL (ref 4.0–10.5)
WBC: 9.6 10*3/uL (ref 4.0–10.5)

## 2010-04-20 LAB — GLUCOSE, CAPILLARY
Glucose-Capillary: 100 mg/dL — ABNORMAL HIGH (ref 70–99)
Glucose-Capillary: 102 mg/dL — ABNORMAL HIGH (ref 70–99)
Glucose-Capillary: 104 mg/dL — ABNORMAL HIGH (ref 70–99)
Glucose-Capillary: 104 mg/dL — ABNORMAL HIGH (ref 70–99)
Glucose-Capillary: 110 mg/dL — ABNORMAL HIGH (ref 70–99)
Glucose-Capillary: 113 mg/dL — ABNORMAL HIGH (ref 70–99)
Glucose-Capillary: 114 mg/dL — ABNORMAL HIGH (ref 70–99)
Glucose-Capillary: 114 mg/dL — ABNORMAL HIGH (ref 70–99)
Glucose-Capillary: 114 mg/dL — ABNORMAL HIGH (ref 70–99)
Glucose-Capillary: 115 mg/dL — ABNORMAL HIGH (ref 70–99)
Glucose-Capillary: 117 mg/dL — ABNORMAL HIGH (ref 70–99)
Glucose-Capillary: 118 mg/dL — ABNORMAL HIGH (ref 70–99)
Glucose-Capillary: 122 mg/dL — ABNORMAL HIGH (ref 70–99)
Glucose-Capillary: 124 mg/dL — ABNORMAL HIGH (ref 70–99)
Glucose-Capillary: 124 mg/dL — ABNORMAL HIGH (ref 70–99)
Glucose-Capillary: 133 mg/dL — ABNORMAL HIGH (ref 70–99)
Glucose-Capillary: 133 mg/dL — ABNORMAL HIGH (ref 70–99)
Glucose-Capillary: 136 mg/dL — ABNORMAL HIGH (ref 70–99)
Glucose-Capillary: 138 mg/dL — ABNORMAL HIGH (ref 70–99)
Glucose-Capillary: 140 mg/dL — ABNORMAL HIGH (ref 70–99)
Glucose-Capillary: 141 mg/dL — ABNORMAL HIGH (ref 70–99)
Glucose-Capillary: 143 mg/dL — ABNORMAL HIGH (ref 70–99)
Glucose-Capillary: 73 mg/dL (ref 70–99)
Glucose-Capillary: 82 mg/dL (ref 70–99)
Glucose-Capillary: 82 mg/dL (ref 70–99)
Glucose-Capillary: 82 mg/dL (ref 70–99)
Glucose-Capillary: 82 mg/dL (ref 70–99)
Glucose-Capillary: 82 mg/dL (ref 70–99)
Glucose-Capillary: 82 mg/dL (ref 70–99)
Glucose-Capillary: 82 mg/dL (ref 70–99)
Glucose-Capillary: 82 mg/dL (ref 70–99)
Glucose-Capillary: 82 mg/dL (ref 70–99)
Glucose-Capillary: 82 mg/dL (ref 70–99)
Glucose-Capillary: 82 mg/dL (ref 70–99)
Glucose-Capillary: 82 mg/dL (ref 70–99)
Glucose-Capillary: 82 mg/dL (ref 70–99)
Glucose-Capillary: 82 mg/dL (ref 70–99)
Glucose-Capillary: 86 mg/dL (ref 70–99)
Glucose-Capillary: 95 mg/dL (ref 70–99)
Glucose-Capillary: 95 mg/dL (ref 70–99)
Glucose-Capillary: 97 mg/dL (ref 70–99)
Glucose-Capillary: 99 mg/dL (ref 70–99)

## 2010-04-20 LAB — POCT I-STAT 3, ART BLOOD GAS (G3+)
Acid-Base Excess: 2 mmol/L (ref 0.0–2.0)
Acid-base deficit: 2 mmol/L (ref 0.0–2.0)
Acid-base deficit: 6 mmol/L — ABNORMAL HIGH (ref 0.0–2.0)
Bicarbonate: 19.5 mEq/L — ABNORMAL LOW (ref 20.0–24.0)
Bicarbonate: 22.8 mEq/L (ref 20.0–24.0)
Bicarbonate: 24.9 mEq/L — ABNORMAL HIGH (ref 20.0–24.0)
Bicarbonate: 27.9 mEq/L — ABNORMAL HIGH (ref 20.0–24.0)
O2 Saturation: 100 %
O2 Saturation: 95 %
O2 Saturation: 96 %
O2 Saturation: 98 %
Patient temperature: 32.7
Patient temperature: 34.2
Patient temperature: 99.1
TCO2: 21 mmol/L (ref 0–100)
TCO2: 24 mmol/L (ref 0–100)
TCO2: 26 mmol/L (ref 0–100)
TCO2: 29 mmol/L (ref 0–100)
pCO2 arterial: 31.8 mmHg — ABNORMAL LOW (ref 35.0–45.0)
pCO2 arterial: 34.3 mmHg — ABNORMAL LOW (ref 35.0–45.0)
pCO2 arterial: 42.6 mmHg (ref 35.0–45.0)
pCO2 arterial: 50.4 mmHg — ABNORMAL HIGH (ref 35.0–45.0)
pH, Arterial: 7.349 — ABNORMAL LOW (ref 7.350–7.400)
pH, Arterial: 7.351 (ref 7.350–7.400)
pH, Arterial: 7.377 (ref 7.350–7.400)
pH, Arterial: 7.445 — ABNORMAL HIGH (ref 7.350–7.400)
pO2, Arterial: 417 mmHg — ABNORMAL HIGH (ref 80.0–100.0)
pO2, Arterial: 65 mmHg — ABNORMAL LOW (ref 80.0–100.0)
pO2, Arterial: 81 mmHg (ref 80.0–100.0)
pO2, Arterial: 92 mmHg (ref 80.0–100.0)

## 2010-04-20 LAB — BASIC METABOLIC PANEL
BUN: 7 mg/dL (ref 6–23)
BUN: 7 mg/dL (ref 6–23)
BUN: 8 mg/dL (ref 6–23)
BUN: 8 mg/dL (ref 6–23)
BUN: 8 mg/dL (ref 6–23)
BUN: 9 mg/dL (ref 6–23)
CO2: 25 mEq/L (ref 19–32)
CO2: 25 mEq/L (ref 19–32)
CO2: 25 mEq/L (ref 19–32)
CO2: 28 mEq/L (ref 19–32)
CO2: 29 mEq/L (ref 19–32)
CO2: 30 mEq/L (ref 19–32)
Calcium: 8.3 mg/dL — ABNORMAL LOW (ref 8.4–10.5)
Calcium: 8.4 mg/dL (ref 8.4–10.5)
Calcium: 8.4 mg/dL (ref 8.4–10.5)
Calcium: 8.5 mg/dL (ref 8.4–10.5)
Calcium: 8.5 mg/dL (ref 8.4–10.5)
Chloride: 102 mEq/L (ref 96–112)
Chloride: 103 mEq/L (ref 96–112)
Chloride: 103 mEq/L (ref 96–112)
Chloride: 104 mEq/L (ref 96–112)
Chloride: 107 mEq/L (ref 96–112)
Chloride: 109 mEq/L (ref 96–112)
Creatinine, Ser: 0.74 mg/dL (ref 0.4–1.2)
Creatinine, Ser: 0.76 mg/dL (ref 0.4–1.2)
Creatinine, Ser: 0.76 mg/dL (ref 0.4–1.2)
Creatinine, Ser: 0.92 mg/dL (ref 0.4–1.2)
Creatinine, Ser: 0.92 mg/dL (ref 0.4–1.2)
GFR calc Af Amer: >60 mL/min (ref >60)
GFR calc Af Amer: >60 mL/min (ref >60)
GFR calc Af Amer: >60 mL/min (ref >60)
GFR calc Af Amer: >60 mL/min (ref >60)
GFR calc Af Amer: >60 mL/min (ref >60)
GFR calc non Af Amer: >60 mL/min (ref >60)
GFR calc non Af Amer: >60 mL/min (ref >60)
GFR calc non Af Amer: >60 mL/min (ref >60)
GFR calc non Af Amer: >60 mL/min (ref >60)
GFR calc non Af Amer: >60 mL/min (ref >60)
Glucose, Bld: 100 mg/dL — ABNORMAL HIGH (ref 70–99)
Glucose, Bld: 109 mg/dL — ABNORMAL HIGH (ref 70–99)
Glucose, Bld: 121 mg/dL — ABNORMAL HIGH (ref 70–99)
Glucose, Bld: 129 mg/dL — ABNORMAL HIGH (ref 70–99)
Glucose, Bld: 130 mg/dL — ABNORMAL HIGH (ref 70–99)
Glucose, Bld: 139 mg/dL — ABNORMAL HIGH (ref 70–99)
Potassium: 3.4 mEq/L — ABNORMAL LOW (ref 3.5–5.1)
Potassium: 3.7 mEq/L (ref 3.5–5.1)
Potassium: 3.7 mEq/L (ref 3.5–5.1)
Potassium: 3.9 mEq/L (ref 3.5–5.1)
Potassium: 4 mEq/L (ref 3.5–5.1)
Potassium: 4.5 mEq/L (ref 3.5–5.1)
Sodium: 134 mEq/L — ABNORMAL LOW (ref 135–145)
Sodium: 138 mEq/L (ref 135–145)
Sodium: 138 mEq/L (ref 135–145)
Sodium: 138 mEq/L (ref 135–145)
Sodium: 139 mEq/L (ref 135–145)
Sodium: 139 mEq/L (ref 135–145)

## 2010-04-20 LAB — POCT I-STAT 4, (NA,K, GLUC, HGB,HCT)
Glucose, Bld: 111 mg/dL — ABNORMAL HIGH (ref 70–99)
Glucose, Bld: 85 mg/dL (ref 70–99)
Glucose, Bld: 91 mg/dL (ref 70–99)
Glucose, Bld: 98 mg/dL (ref 70–99)
Glucose, Bld: 99 mg/dL (ref 70–99)
HCT: 31 % — ABNORMAL LOW (ref 36.0–46.0)
HCT: 33 % — ABNORMAL LOW (ref 36.0–46.0)
HCT: 33 % — ABNORMAL LOW (ref 36.0–46.0)
HCT: 34 % — ABNORMAL LOW (ref 36.0–46.0)
HCT: 35 % — ABNORMAL LOW (ref 36.0–46.0)
Hemoglobin: 10.5 g/dL — ABNORMAL LOW (ref 12.0–15.0)
Hemoglobin: 11.2 g/dL — ABNORMAL LOW (ref 12.0–15.0)
Hemoglobin: 11.2 g/dL — ABNORMAL LOW (ref 12.0–15.0)
Hemoglobin: 11.6 g/dL — ABNORMAL LOW (ref 12.0–15.0)
Hemoglobin: 11.9 g/dL — ABNORMAL LOW (ref 12.0–15.0)
Potassium: 2.9 mEq/L — ABNORMAL LOW (ref 3.5–5.1)
Potassium: 3.1 mEq/L — ABNORMAL LOW (ref 3.5–5.1)
Potassium: 3.2 mEq/L — ABNORMAL LOW (ref 3.5–5.1)
Potassium: 3.2 mEq/L — ABNORMAL LOW (ref 3.5–5.1)
Potassium: 3.4 mEq/L — ABNORMAL LOW (ref 3.5–5.1)
Sodium: 141 mEq/L (ref 135–145)
Sodium: 142 mEq/L (ref 135–145)
Sodium: 142 mEq/L (ref 135–145)
Sodium: 144 mEq/L (ref 135–145)
Sodium: 146 mEq/L — ABNORMAL HIGH (ref 135–145)

## 2010-04-20 LAB — POCT I-STAT, CHEM 8
BUN: 7 mg/dL (ref 6–23)
BUN: 8 mg/dL (ref 6–23)
Calcium, Ion: 1.15 mmol/L (ref 1.12–1.32)
Calcium, Ion: 1.29 mmol/L (ref 1.12–1.32)
Chloride: 105 mEq/L (ref 96–112)
Chloride: 109 mEq/L (ref 96–112)
Creatinine, Ser: 0.6 mg/dL (ref 0.4–1.2)
Creatinine, Ser: 0.8 mg/dL (ref 0.4–1.2)
Glucose, Bld: 106 mg/dL — ABNORMAL HIGH (ref 70–99)
Glucose, Bld: 148 mg/dL — ABNORMAL HIGH (ref 70–99)
HCT: 35 % — ABNORMAL LOW (ref 36.0–46.0)
HCT: 36 % (ref 36.0–46.0)
Hemoglobin: 11.9 g/dL — ABNORMAL LOW (ref 12.0–15.0)
Hemoglobin: 12.2 g/dL (ref 12.0–15.0)
Potassium: 3.9 mEq/L (ref 3.5–5.1)
Potassium: 4.3 mEq/L (ref 3.5–5.1)
Sodium: 136 mEq/L (ref 135–145)
Sodium: 141 mEq/L (ref 135–145)
TCO2: 21 mmol/L (ref 0–100)
TCO2: 23 mmol/L (ref 0–100)

## 2010-04-20 LAB — HEMOGLOBIN A1C
Hgb A1c MFr Bld: 5.7 % — ABNORMAL HIGH (ref <5.7)
Mean Plasma Glucose: 117 mg/dL — ABNORMAL HIGH (ref <117)

## 2010-04-20 LAB — COMPREHENSIVE METABOLIC PANEL
ALT: 18 U/L (ref 0–35)
AST: 27 U/L (ref 0–37)
Albumin: 3.7 g/dL (ref 3.5–5.2)
Alkaline Phosphatase: 151 U/L — ABNORMAL HIGH (ref 39–117)
BUN: 11 mg/dL (ref 6–23)
CO2: 29 mEq/L (ref 19–32)
Calcium: 9.8 mg/dL (ref 8.4–10.5)
Chloride: 101 mEq/L (ref 96–112)
Creatinine, Ser: 0.89 mg/dL (ref 0.4–1.2)
GFR calc Af Amer: >60 mL/min (ref >60)
GFR calc non Af Amer: >60 mL/min (ref >60)
Glucose, Bld: 102 mg/dL — ABNORMAL HIGH (ref 70–99)
Potassium: 4.2 mEq/L (ref 3.5–5.1)
Sodium: 140 mEq/L (ref 135–145)
Total Bilirubin: 0.8 mg/dL (ref 0.3–1.2)
Total Protein: 6.9 g/dL (ref 6.0–8.3)

## 2010-04-20 LAB — APTT
aPTT: 34 seconds (ref 24–37)
aPTT: 34 seconds (ref 24–37)

## 2010-04-20 LAB — BLOOD GAS, ARTERIAL
Acid-Base Excess: 2.6 mmol/L — ABNORMAL HIGH (ref 0.0–2.0)
Bicarbonate: 26.7 mEq/L — ABNORMAL HIGH (ref 20.0–24.0)
Drawn by: 206361
FIO2: 0.21 %
O2 Saturation: 97.8 %
Patient temperature: 98.6
TCO2: 27.9 mmol/L (ref 0–100)
pCO2 arterial: 41.3 mmHg (ref 35.0–45.0)
pH, Arterial: 7.425 — ABNORMAL HIGH (ref 7.350–7.400)
pO2, Arterial: 90.4 mmHg (ref 80.0–100.0)

## 2010-04-20 LAB — URINALYSIS, ROUTINE W REFLEX MICROSCOPIC
Bilirubin Urine: NEGATIVE
Glucose, UA: NEGATIVE mg/dL
Hgb urine dipstick: NEGATIVE
Ketones, ur: NEGATIVE mg/dL
Nitrite: NEGATIVE
Protein, ur: NEGATIVE mg/dL
Specific Gravity, Urine: 1.015 (ref 1.005–1.030)
Urobilinogen, UA: 1 mg/dL (ref 0.0–1.0)
pH: 6.5 (ref 5.0–8.0)

## 2010-04-20 LAB — CREATININE, SERUM
Creatinine, Ser: 0.66 mg/dL (ref 0.4–1.2)
Creatinine, Ser: 0.75 mg/dL (ref 0.4–1.2)
GFR calc Af Amer: >60 mL/min (ref >60)
GFR calc Af Amer: >60 mL/min (ref >60)
GFR calc non Af Amer: >60 mL/min (ref >60)
GFR calc non Af Amer: >60 mL/min (ref >60)

## 2010-04-20 LAB — TSH: TSH: 0.756 u[IU]/mL (ref 0.350–4.500)

## 2010-04-20 LAB — PROTIME-INR
INR: 0.93 (ref 0.00–1.49)
INR: 1.22 (ref 0.00–1.49)
Prothrombin Time: 12.7 seconds (ref 11.6–15.2)
Prothrombin Time: 15.6 seconds — ABNORMAL HIGH (ref 11.6–15.2)

## 2010-04-20 LAB — HEPATIC FUNCTION PANEL
ALT: 15 U/L (ref 0–35)
AST: 23 U/L (ref 0–37)
Albumin: 3.2 g/dL — ABNORMAL LOW (ref 3.5–5.2)
Alkaline Phosphatase: 127 U/L — ABNORMAL HIGH (ref 39–117)
Bilirubin, Direct: 0.1 mg/dL (ref 0.0–0.3)
Indirect Bilirubin: 0.7 mg/dL (ref 0.3–0.9)
Total Bilirubin: 0.8 mg/dL (ref 0.3–1.2)
Total Protein: 6.5 g/dL (ref 6.0–8.3)

## 2010-04-20 LAB — ABO/RH: ABO/RH(D): A POS

## 2010-04-20 LAB — MAGNESIUM
Magnesium: 1.9 mg/dL (ref 1.5–2.5)
Magnesium: 2.1 mg/dL (ref 1.5–2.5)
Magnesium: 2.7 mg/dL — ABNORMAL HIGH (ref 1.5–2.5)

## 2010-04-20 LAB — TYPE AND SCREEN
ABO/RH(D): A POS
Antibody Screen: NEGATIVE

## 2010-04-20 LAB — POCT I-STAT GLUCOSE
Glucose, Bld: 114 mg/dL — ABNORMAL HIGH (ref 70–99)
Operator id: 125961

## 2010-04-20 LAB — SURGICAL PCR SCREEN
MRSA, PCR: NEGATIVE
Staphylococcus aureus: POSITIVE — AB

## 2010-04-21 ENCOUNTER — Encounter (HOSPITAL_COMMUNITY): Payer: Self-pay

## 2010-04-24 ENCOUNTER — Encounter (HOSPITAL_COMMUNITY): Payer: Self-pay

## 2010-04-24 LAB — BASIC METABOLIC PANEL
BUN: 8 mg/dL (ref 6–23)
CO2: 28 mEq/L (ref 19–32)
Chloride: 105 mEq/L (ref 96–112)
Creatinine, Ser: 0.83 mg/dL (ref 0.4–1.2)
GFR calc Af Amer: >60 mL/min (ref >60)
GFR calc Af Amer: >60 mL/min (ref >60)
GFR calc non Af Amer: >60 mL/min (ref >60)
Glucose, Bld: 105 mg/dL — ABNORMAL HIGH (ref 70–99)
Potassium: 3.8 mEq/L (ref 3.5–5.1)
Sodium: 136 mEq/L (ref 135–145)

## 2010-04-24 LAB — HEPARIN LEVEL (UNFRACTIONATED): Heparin Unfractionated: <0.1 IU/mL — ABNORMAL LOW (ref 0.30–0.70)

## 2010-04-24 LAB — TSH: TSH: 2.669 u[IU]/mL (ref 0.350–4.500)

## 2010-04-24 LAB — D-DIMER, QUANTITATIVE: D-Dimer, Quant: 0.29 ug/mL-FEU (ref 0.00–0.48)

## 2010-04-24 LAB — CBC
HCT: 34.7 % — ABNORMAL LOW (ref 36.0–46.0)
Hemoglobin: 11.9 g/dL — ABNORMAL LOW (ref 12.0–15.0)
Hemoglobin: 13.6 g/dL (ref 12.0–15.0)
MCHC: 34.2 g/dL (ref 30.0–36.0)
MCV: 89.5 fL (ref 78.0–100.0)
Platelets: 230 10*3/uL (ref 150–400)
RBC: 4.45 MIL/uL (ref 3.87–5.11)
RDW: 14.7 % (ref 11.5–15.5)
WBC: 6.3 10*3/uL (ref 4.0–10.5)

## 2010-04-24 LAB — DIFFERENTIAL
Basophils Relative: 1 % (ref 0–1)
Eosinophils Absolute: 0.1 10*3/uL (ref 0.0–0.7)
Monocytes Absolute: 0.7 10*3/uL (ref 0.1–1.0)
Monocytes Relative: 11 % (ref 3–12)
Neutrophils Relative %: 49 % (ref 43–77)

## 2010-04-24 LAB — TROPONIN I: Troponin I: 0.15 ng/mL — ABNORMAL HIGH (ref 0.00–0.06)

## 2010-04-24 LAB — CK TOTAL AND CKMB (NOT AT ARMC)
CK, MB: 1.6 ng/mL (ref 0.3–4.0)
Total CK: 43 U/L (ref 7–177)

## 2010-04-24 LAB — APTT: aPTT: 31 seconds (ref 24–37)

## 2010-04-25 ENCOUNTER — Encounter (HOSPITAL_COMMUNITY): Payer: Self-pay

## 2010-04-26 ENCOUNTER — Encounter (HOSPITAL_COMMUNITY): Payer: Self-pay

## 2010-04-27 ENCOUNTER — Encounter (HOSPITAL_COMMUNITY): Payer: Self-pay

## 2010-04-28 ENCOUNTER — Encounter (HOSPITAL_COMMUNITY): Payer: Self-pay

## 2010-05-01 ENCOUNTER — Encounter (HOSPITAL_COMMUNITY): Payer: Self-pay

## 2010-05-02 ENCOUNTER — Encounter (HOSPITAL_COMMUNITY): Payer: Self-pay

## 2010-05-03 ENCOUNTER — Encounter (HOSPITAL_COMMUNITY): Payer: Self-pay

## 2010-05-04 ENCOUNTER — Encounter (HOSPITAL_COMMUNITY): Payer: Self-pay

## 2010-05-05 ENCOUNTER — Encounter (HOSPITAL_COMMUNITY): Payer: Self-pay

## 2010-05-08 ENCOUNTER — Encounter (HOSPITAL_COMMUNITY): Payer: Self-pay

## 2010-05-09 ENCOUNTER — Encounter (HOSPITAL_COMMUNITY): Payer: Self-pay | Attending: Critical Care Medicine

## 2010-05-09 DIAGNOSIS — J4489 Other specified chronic obstructive pulmonary disease: Secondary | ICD-10-CM | POA: Insufficient documentation

## 2010-05-09 DIAGNOSIS — I671 Cerebral aneurysm, nonruptured: Secondary | ICD-10-CM | POA: Insufficient documentation

## 2010-05-09 DIAGNOSIS — Z951 Presence of aortocoronary bypass graft: Secondary | ICD-10-CM | POA: Insufficient documentation

## 2010-05-09 DIAGNOSIS — I10 Essential (primary) hypertension: Secondary | ICD-10-CM | POA: Insufficient documentation

## 2010-05-09 DIAGNOSIS — K219 Gastro-esophageal reflux disease without esophagitis: Secondary | ICD-10-CM | POA: Insufficient documentation

## 2010-05-09 DIAGNOSIS — I200 Unstable angina: Secondary | ICD-10-CM | POA: Insufficient documentation

## 2010-05-09 DIAGNOSIS — Z882 Allergy status to sulfonamides status: Secondary | ICD-10-CM | POA: Insufficient documentation

## 2010-05-09 DIAGNOSIS — I4891 Unspecified atrial fibrillation: Secondary | ICD-10-CM | POA: Insufficient documentation

## 2010-05-09 DIAGNOSIS — Z5189 Encounter for other specified aftercare: Secondary | ICD-10-CM | POA: Insufficient documentation

## 2010-05-09 DIAGNOSIS — R0602 Shortness of breath: Secondary | ICD-10-CM | POA: Insufficient documentation

## 2010-05-09 DIAGNOSIS — Z96649 Presence of unspecified artificial hip joint: Secondary | ICD-10-CM | POA: Insufficient documentation

## 2010-05-09 DIAGNOSIS — Z87891 Personal history of nicotine dependence: Secondary | ICD-10-CM | POA: Insufficient documentation

## 2010-05-09 DIAGNOSIS — I509 Heart failure, unspecified: Secondary | ICD-10-CM | POA: Insufficient documentation

## 2010-05-09 DIAGNOSIS — I2510 Atherosclerotic heart disease of native coronary artery without angina pectoris: Secondary | ICD-10-CM | POA: Insufficient documentation

## 2010-05-09 DIAGNOSIS — Z7982 Long term (current) use of aspirin: Secondary | ICD-10-CM | POA: Insufficient documentation

## 2010-05-09 DIAGNOSIS — Z880 Allergy status to penicillin: Secondary | ICD-10-CM | POA: Insufficient documentation

## 2010-05-09 DIAGNOSIS — Z7902 Long term (current) use of antithrombotics/antiplatelets: Secondary | ICD-10-CM | POA: Insufficient documentation

## 2010-05-09 DIAGNOSIS — J449 Chronic obstructive pulmonary disease, unspecified: Secondary | ICD-10-CM | POA: Insufficient documentation

## 2010-05-10 ENCOUNTER — Encounter (HOSPITAL_COMMUNITY): Payer: Self-pay

## 2010-05-11 ENCOUNTER — Encounter (HOSPITAL_COMMUNITY): Payer: Self-pay

## 2010-05-11 LAB — CBC
Hemoglobin: 10.9 g/dL — ABNORMAL LOW (ref 12.0–15.0)
MCHC: 34 g/dL (ref 30.0–36.0)
RBC: 3.31 MIL/uL — ABNORMAL LOW (ref 3.87–5.11)

## 2010-05-11 LAB — POCT CARDIAC MARKERS
CKMB, poc: 1.2 ng/mL (ref 1.0–8.0)
CKMB, poc: <1 ng/mL — ABNORMAL LOW (ref 1.0–8.0)
Myoglobin, poc: 187 ng/mL (ref 12–200)
Troponin i, poc: <0.05 ng/mL (ref 0.00–0.09)

## 2010-05-11 LAB — DIFFERENTIAL
Basophils Relative: 1 % (ref 0–1)
Monocytes Absolute: 0.7 10*3/uL (ref 0.1–1.0)
Monocytes Relative: 14 % — ABNORMAL HIGH (ref 3–12)
Neutro Abs: 2.2 10*3/uL (ref 1.7–7.7)

## 2010-05-11 LAB — APTT: aPTT: 32 seconds (ref 24–37)

## 2010-05-11 LAB — URINALYSIS, ROUTINE W REFLEX MICROSCOPIC
Bilirubin Urine: NEGATIVE
Hgb urine dipstick: NEGATIVE
Protein, ur: NEGATIVE mg/dL
Urobilinogen, UA: 0.2 mg/dL (ref 0.0–1.0)

## 2010-05-11 LAB — URINE CULTURE

## 2010-05-11 LAB — GLUCOSE, CAPILLARY: Glucose-Capillary: 97 mg/dL (ref 70–99)

## 2010-05-11 LAB — POCT I-STAT, CHEM 8
BUN: 7 mg/dL (ref 6–23)
Calcium, Ion: 1.07 mmol/L — ABNORMAL LOW (ref 1.12–1.32)
Chloride: 101 mEq/L (ref 96–112)
Potassium: 3.6 mEq/L (ref 3.5–5.1)
Sodium: 138 mEq/L (ref 135–145)

## 2010-05-12 ENCOUNTER — Encounter (HOSPITAL_COMMUNITY): Payer: Self-pay

## 2010-05-12 LAB — URINALYSIS, ROUTINE W REFLEX MICROSCOPIC
Bilirubin Urine: NEGATIVE
Nitrite: NEGATIVE
Specific Gravity, Urine: 1.015 (ref 1.005–1.030)
pH: 7 (ref 5.0–8.0)

## 2010-05-12 LAB — CBC
HCT: 42.4 % (ref 36.0–46.0)
Hemoglobin: 14.4 g/dL (ref 12.0–15.0)
Hemoglobin: 9.9 g/dL — ABNORMAL LOW (ref 12.0–15.0)
MCHC: 34.1 g/dL (ref 30.0–36.0)
MCHC: 34.3 g/dL (ref 30.0–36.0)
MCV: 96.4 fL (ref 78.0–100.0)
Platelets: 178 10*3/uL (ref 150–400)
RBC: 3.01 MIL/uL — ABNORMAL LOW (ref 3.87–5.11)
RBC: 3.22 MIL/uL — ABNORMAL LOW (ref 3.87–5.11)
RBC: 4.42 MIL/uL (ref 3.87–5.11)
WBC: 6.4 10*3/uL (ref 4.0–10.5)
WBC: 8 10*3/uL (ref 4.0–10.5)
WBC: 8 10*3/uL (ref 4.0–10.5)

## 2010-05-12 LAB — BASIC METABOLIC PANEL
BUN: 6 mg/dL (ref 6–23)
CO2: 30 mEq/L (ref 19–32)
Calcium: 8.2 mg/dL — ABNORMAL LOW (ref 8.4–10.5)
Calcium: 8.3 mg/dL — ABNORMAL LOW (ref 8.4–10.5)
Calcium: 9.5 mg/dL (ref 8.4–10.5)
Creatinine, Ser: 0.74 mg/dL (ref 0.4–1.2)
Creatinine, Ser: 0.88 mg/dL (ref 0.4–1.2)
GFR calc Af Amer: >60 mL/min (ref >60)
GFR calc Af Amer: >60 mL/min (ref >60)
GFR calc Af Amer: >60 mL/min (ref >60)
GFR calc non Af Amer: >60 mL/min (ref >60)
Glucose, Bld: 111 mg/dL — ABNORMAL HIGH (ref 70–99)
Potassium: 3.7 mEq/L (ref 3.5–5.1)
Sodium: 136 mEq/L (ref 135–145)
Sodium: 137 mEq/L (ref 135–145)

## 2010-05-12 LAB — DIFFERENTIAL
Eosinophils Relative: 1 % (ref 0–5)
Lymphocytes Relative: 41 % (ref 12–46)
Lymphs Abs: 3.3 10*3/uL (ref 0.7–4.0)
Monocytes Absolute: 0.5 10*3/uL (ref 0.1–1.0)
Monocytes Relative: 7 % (ref 3–12)
Neutro Abs: 4 10*3/uL (ref 1.7–7.7)

## 2010-05-12 LAB — TYPE AND SCREEN
ABO/RH(D): A POS
Antibody Screen: NEGATIVE

## 2010-05-12 LAB — ABO/RH: ABO/RH(D): A POS

## 2010-05-12 LAB — APTT: aPTT: 30 seconds (ref 24–37)

## 2010-05-15 ENCOUNTER — Encounter (HOSPITAL_COMMUNITY): Payer: Self-pay

## 2010-05-16 ENCOUNTER — Encounter (HOSPITAL_COMMUNITY): Payer: Self-pay

## 2010-05-17 ENCOUNTER — Encounter (HOSPITAL_COMMUNITY): Payer: Self-pay

## 2010-05-18 ENCOUNTER — Encounter (HOSPITAL_COMMUNITY): Payer: Self-pay

## 2010-05-19 ENCOUNTER — Encounter (HOSPITAL_COMMUNITY): Payer: Self-pay

## 2010-05-22 ENCOUNTER — Encounter (HOSPITAL_COMMUNITY): Payer: Self-pay

## 2010-05-23 ENCOUNTER — Encounter (HOSPITAL_COMMUNITY): Payer: Self-pay

## 2010-05-24 ENCOUNTER — Encounter (HOSPITAL_COMMUNITY): Payer: Self-pay

## 2010-05-25 ENCOUNTER — Encounter (HOSPITAL_COMMUNITY): Payer: Self-pay

## 2010-05-25 ENCOUNTER — Encounter: Payer: Self-pay | Admitting: Cardiology

## 2010-05-25 DIAGNOSIS — R5383 Other fatigue: Secondary | ICD-10-CM | POA: Insufficient documentation

## 2010-05-25 DIAGNOSIS — R238 Other skin changes: Secondary | ICD-10-CM | POA: Insufficient documentation

## 2010-05-25 DIAGNOSIS — R309 Painful micturition, unspecified: Secondary | ICD-10-CM | POA: Insufficient documentation

## 2010-05-25 DIAGNOSIS — I4510 Unspecified right bundle-branch block: Secondary | ICD-10-CM | POA: Insufficient documentation

## 2010-05-25 DIAGNOSIS — I2510 Atherosclerotic heart disease of native coronary artery without angina pectoris: Secondary | ICD-10-CM | POA: Insufficient documentation

## 2010-05-25 DIAGNOSIS — J439 Emphysema, unspecified: Secondary | ICD-10-CM | POA: Insufficient documentation

## 2010-05-25 DIAGNOSIS — F419 Anxiety disorder, unspecified: Secondary | ICD-10-CM | POA: Insufficient documentation

## 2010-05-25 DIAGNOSIS — M2550 Pain in unspecified joint: Secondary | ICD-10-CM | POA: Insufficient documentation

## 2010-05-25 DIAGNOSIS — R0602 Shortness of breath: Secondary | ICD-10-CM | POA: Insufficient documentation

## 2010-05-25 DIAGNOSIS — K589 Irritable bowel syndrome without diarrhea: Secondary | ICD-10-CM | POA: Insufficient documentation

## 2010-05-25 DIAGNOSIS — I4891 Unspecified atrial fibrillation: Secondary | ICD-10-CM | POA: Insufficient documentation

## 2010-05-26 ENCOUNTER — Encounter (HOSPITAL_COMMUNITY): Payer: Self-pay

## 2010-05-29 ENCOUNTER — Ambulatory Visit (INDEPENDENT_AMBULATORY_CARE_PROVIDER_SITE_OTHER): Payer: Medicare Other | Admitting: Cardiology

## 2010-05-29 ENCOUNTER — Encounter (HOSPITAL_COMMUNITY): Payer: Self-pay

## 2010-05-29 ENCOUNTER — Encounter: Payer: Self-pay | Admitting: Cardiology

## 2010-05-29 VITALS — BP 110/78 | HR 80 | Wt 158.0 lb

## 2010-05-29 DIAGNOSIS — B373 Candidiasis of vulva and vagina: Secondary | ICD-10-CM

## 2010-05-29 DIAGNOSIS — J449 Chronic obstructive pulmonary disease, unspecified: Secondary | ICD-10-CM

## 2010-05-29 DIAGNOSIS — J4489 Other specified chronic obstructive pulmonary disease: Secondary | ICD-10-CM

## 2010-05-29 DIAGNOSIS — B3731 Acute candidiasis of vulva and vagina: Secondary | ICD-10-CM

## 2010-05-29 DIAGNOSIS — I2510 Atherosclerotic heart disease of native coronary artery without angina pectoris: Secondary | ICD-10-CM

## 2010-05-29 DIAGNOSIS — I4891 Unspecified atrial fibrillation: Secondary | ICD-10-CM

## 2010-05-29 MED ORDER — FLUCONAZOLE 100 MG PO TABS: 100.0000 mg | ORAL_TABLET | Freq: Every day | ORAL | Status: AC

## 2010-05-29 NOTE — Assessment & Plan Note (Signed)
She has still some atypical chest symptoms which sound more musculoskeletal. She is having no significant angina at this time. We'll continue with her current therapy.

## 2010-05-29 NOTE — Progress Notes (Signed)
Renee [name] Date of Birth: Oct 30, 1934   History of Present Illness: Renee [name] is seen for followup today. She has multiple complaints. She has not had any significant anginal symptoms. She was seen by Dr. Tyrone Sage concerning her incision and was referred to Dr. Shon Hough for consideration. An injection was performed but she has noticed no improvement. She still complains of some mild chest discomfort at rest along her sternum that radiates through to her back. She continues to have some shortness of breath but feels that this is doing better. Infrequently she has some sensation of fluttering in her chest this occurs less than once a month.  Current Outpatient Prescriptions on File Prior to Visit  Medication Sig Dispense Refill  . aspirin 81 MG tablet Take 81 mg by mouth daily.        . Bisacodyl (DULCOLAX PO) Take by mouth as needed.        . calcium citrate-vitamin D (CITRACAL+D) 315-200 MG-UNIT per tablet Take 1 tablet by mouth daily.        . diazepam (VALIUM) 5 MG tablet Take 5 mg by mouth every 6 (six) hours as needed.        [name] esomeprazole (NEXIUM) 40 MG capsule Take 40 mg by mouth daily before breakfast.        . furosemide (LASIX) 40 MG tablet Take 40 mg by mouth 2 (two) times daily.       [name] levothyroxine (SYNTHROID, LEVOTHROID) 50 MCG tablet Take 50 mcg by mouth daily.        . metoprolol tartrate (LOPRESSOR) 25 MG tablet Take 1 tablet by mouth Twice daily.      . Omega-3 Fatty Acids (FISH OIL) 1000 MG CAPS Take by mouth 3 (three) times daily.        . potassium chloride SA (K-DUR,KLOR-CON) 20 MEQ tablet Take 20 mEq by mouth daily.        . Tiotropium Bromide Monohydrate (SPIRIVA HANDIHALER IN) Inhale into the lungs daily.        [name] DISCONTD: metoprolol succinate (TOPROL-XL) 25 MG 24 hr tablet Take 25 mg by mouth 2 (two) times daily.          Allergies  Allergen Reactions  . Hydrocodone-Acetaminophen     REACTION: rash  . Morphine     REACTION: double vision  . Penicillins       REACTION: swelling/rash  . Phenazopyridine Hcl     REACTION: face red  . Pneumococcal Vaccine     REACTION: rash and cough  . Prednisone     REACTION: rash  . Sulfonamide Derivatives     REACTION: rash    Past Medical History  Diagnosis Date  . GERD (gastroesophageal reflux disease)   . IBS (irritable bowel syndrome)   . Bundle branch block, right   . Coronary artery disease   . Emphysema/COPD   . Fatigue   . SOB (shortness of breath)   . Bruises easily   . Joint pain   . Weakness   . Anxiety   . Pain on voiding   . AF (atrial fibrillation)   . Fracture of toe of left foot   . HTN (hypertension)     Past Surgical History  Procedure Date  . Coronary artery bypass graft   . Total hip arthroplasty   . Appendectomy   . Cholecystectomy     LARYNX  . Tonsillectomy and adenoidectomy   . Total abdominal hysterectomy     History  Smoking  status  . Former Smoker  . Quit date: 02/06/1979  Smokeless tobacco  . Not on file    History  Alcohol Use No    Family History  Problem Relation Age of Onset  . Heart disease Mother   . Transient ischemic attack Mother   . Heart attack Father   . Hypertension Father   . Diabetes Father     Review of Systems: The review of systems is positive for recent sinus infection with congestion. She has been treated with doxycycline and a nasal spray. There is a thickened cord along the upper sternal incision that extends up into her neck and this creates a pulling sensation and discomfort for her.  All other systems were reviewed and are negative.  Physical Exam: BP 110/78  Pulse 80  Wt 158 lb (71.668 kg)  LABORATORY DATA: She is an elderly white female in no acute distress. Her HEENT exam is unremarkable. She has no JVD or bruits. Her sternal wound has healed nicely with some scar tissue formation. There is a band of tissue from the upper portion of her sternal incision into her neck that is firm. Her lungs are clear. Cardiac  exam reveals a regular rate and rhythm without gallop, murmur, or click. Abdomen is soft and nontender. She has no edema. Her pedal pulses are one plus. She is alert and oriented x3 and cranial nerves II through XII are intact.  Assessment / Plan:

## 2010-05-29 NOTE — Patient Instructions (Signed)
Continue current medications.  We will see you for follow up office visit in 6 months.  Continue Rehab.

## 2010-05-29 NOTE — Assessment & Plan Note (Signed)
Continue therapy as directed by Dr. Delford Field.

## 2010-05-29 NOTE — Assessment & Plan Note (Signed)
No clear evidence of recurrence. She has very infrequent symptoms of palpitations. We'll continue to monitor closely.

## 2010-05-30 ENCOUNTER — Encounter (HOSPITAL_COMMUNITY): Payer: Self-pay

## 2010-06-01 ENCOUNTER — Encounter (HOSPITAL_COMMUNITY): Payer: Self-pay

## 2010-06-06 ENCOUNTER — Encounter (HOSPITAL_COMMUNITY): Payer: Self-pay | Attending: Critical Care Medicine

## 2010-06-06 DIAGNOSIS — R0602 Shortness of breath: Secondary | ICD-10-CM | POA: Insufficient documentation

## 2010-06-06 DIAGNOSIS — I2510 Atherosclerotic heart disease of native coronary artery without angina pectoris: Secondary | ICD-10-CM | POA: Insufficient documentation

## 2010-06-06 DIAGNOSIS — Z951 Presence of aortocoronary bypass graft: Secondary | ICD-10-CM | POA: Insufficient documentation

## 2010-06-06 DIAGNOSIS — Z87891 Personal history of nicotine dependence: Secondary | ICD-10-CM | POA: Insufficient documentation

## 2010-06-06 DIAGNOSIS — I509 Heart failure, unspecified: Secondary | ICD-10-CM | POA: Insufficient documentation

## 2010-06-06 DIAGNOSIS — J449 Chronic obstructive pulmonary disease, unspecified: Secondary | ICD-10-CM | POA: Insufficient documentation

## 2010-06-06 DIAGNOSIS — Z5189 Encounter for other specified aftercare: Secondary | ICD-10-CM | POA: Insufficient documentation

## 2010-06-06 DIAGNOSIS — Z7982 Long term (current) use of aspirin: Secondary | ICD-10-CM | POA: Insufficient documentation

## 2010-06-06 DIAGNOSIS — J4489 Other specified chronic obstructive pulmonary disease: Secondary | ICD-10-CM | POA: Insufficient documentation

## 2010-06-06 DIAGNOSIS — Z880 Allergy status to penicillin: Secondary | ICD-10-CM | POA: Insufficient documentation

## 2010-06-06 DIAGNOSIS — I4891 Unspecified atrial fibrillation: Secondary | ICD-10-CM | POA: Insufficient documentation

## 2010-06-06 DIAGNOSIS — I200 Unstable angina: Secondary | ICD-10-CM | POA: Insufficient documentation

## 2010-06-06 DIAGNOSIS — K219 Gastro-esophageal reflux disease without esophagitis: Secondary | ICD-10-CM | POA: Insufficient documentation

## 2010-06-06 DIAGNOSIS — Z882 Allergy status to sulfonamides status: Secondary | ICD-10-CM | POA: Insufficient documentation

## 2010-06-06 DIAGNOSIS — I671 Cerebral aneurysm, nonruptured: Secondary | ICD-10-CM | POA: Insufficient documentation

## 2010-06-06 DIAGNOSIS — Z96649 Presence of unspecified artificial hip joint: Secondary | ICD-10-CM | POA: Insufficient documentation

## 2010-06-06 DIAGNOSIS — Z7902 Long term (current) use of antithrombotics/antiplatelets: Secondary | ICD-10-CM | POA: Insufficient documentation

## 2010-06-06 DIAGNOSIS — I10 Essential (primary) hypertension: Secondary | ICD-10-CM | POA: Insufficient documentation

## 2010-06-08 ENCOUNTER — Encounter (HOSPITAL_COMMUNITY): Payer: Self-pay

## 2010-06-13 ENCOUNTER — Encounter (HOSPITAL_COMMUNITY): Payer: Self-pay

## 2010-06-15 ENCOUNTER — Encounter (HOSPITAL_COMMUNITY): Payer: Self-pay

## 2010-06-20 ENCOUNTER — Encounter (HOSPITAL_COMMUNITY): Payer: Self-pay

## 2010-06-20 NOTE — H&P (Signed)
Renee [name], Renee [name]              ACCOUNT NO.:  [id number]   MEDICAL RECORD NO.:  [id number]          PATIENT TYPE:  OBV   LOCATION:  3302                         FACILITY:  [facility]   PHYSICIAN:  Corky Crafts, MDDATE OF BIRTH:  25-Apr-1934   DATE OF ADMISSION:  07/15/2007  DATE OF DISCHARGE:                              HISTORY & PHYSICAL   CHIEF COMPLAINT:  Chest pain.   HISTORY OF PRESENT ILLNESS:  Renee [name] is a 76 year old female with  known history of coronary artery disease.  Years ago, she had abnormal  chest Cardiolite and has subsequent catheterization with normal  coronaries in 2006.   Today, she complained of sharp stabbing chest pain around 10 a.m.  The  pain was quick, but the dull pain is lasting throughout the day.  She  went to see her primary care physician, Dr. Martha Clan, presented to  the emergency room for further evaluation.  She denies any palpitations,  PND, orthopnea, dizziness, or syncope.  She did not recently have a long  car trip or airplane ride.  She denies any leg pain.  She has had jaw  pain for a week, which she states she has TMJ.  Sometimes, her chest  pain is worse with deep breathing.   ALLERGIES:  ASPIRIN, CRESTOR, FOSAMAX, LISINOPRIL, NSAIDS, PENICILLIN,  PREDNISONE for edema, and SULFA.   MEDICATIONS:  1. Hydrochlorothiazide 25 mg a day.  2. Nexium 40 mg a day.  3. Valium 5 mg p.o. daily p.r.n.  4. Calcium daily.  5. Vitamin D daily.  6. Citracal.  7. Fish oil.  8. Premarin, vaginal cream.   FAMILY HISTORY:  Mother died at age 80 with myocardial infarction.  Father died at 77 with myocardial infarction.   SOCIAL HISTORY:  Smokes a half a pack a day.  No alcohol or illicit drug  use.   PAST MEDICAL HISTORY:  1. GERD.  2. Hypertension.  3. Hyperlipidemia.  4. TMJ.  5. Status post cholecystectomy.  6. Status post total hysterectomy.   PHYSICAL EXAMINATION:  VITAL SIGNS:  Temperature 97, blood pressure  121/68, pulse  70, and respirations 20.  HEENT:  Grossly normal.  No carotid or subclavian bruits.  No JVD or  thyromegaly.  Sclerae clear.  Conjunctivae normal.  Nares without  drainage.  CHEST:  Clear to auscultation bilaterally.  No wheezes or rhonchi.  HEART:  Regular rate and rhythm.  No gross murmur, no rub.  ABDOMEN:  Soft, nontender, and nondistended.  No masses.  No bruits.  EXTREMITIES:  No peripheral edema.  No calf tenderness.  NEURO:  Alert and oriented x3 with normal mood and affect.  SKIN:  Warm and dry.   EKG shows normal sinus rhythm with a right bundle branch block, rate at  67.  Chest x-ray, some segmental changes of the left infrahilar region,  linear scarring at the left base.  The last D-dimer 0.33, BUN 15,  creatinine 1.0, potassium 3.1, repleted.  Hemoglobin 13.6, hematocrit of  40.  Point-of-care markers negative x1.   ASSESSMENT AND PLAN:  1. Atypical chest pain.  2.  Normal coronaries in 2006.  3. Hypertension.  4. Hyperlipidemia.  5. Hypokalemia, repleted.   The patient was admitted the overnight for observation.  We will follow  up cardiac isoenzymes.  If the enzymes are negative tomorrow, we will  plan for a adenosine Cardiolite in the office around 12 noon.  The  patient was seen and examined by Dr. Corky Crafts.      Guy Franco, P.A.      Corky Crafts, MD  Electronically Signed    LB/MEDQ  D:  07/16/2007  T:  07/17/2007  Job:  [id number]   cc:   Martha Clan, M.D.

## 2010-06-20 NOTE — Assessment & Plan Note (Signed)
OFFICE VISIT   CHARISSE, WENDELL  DOB:  05/10/1934                                        October 17, 2009  CHART #:  [id number]   HISTORY:  The patient is a 76 year old female status post coronary  artery bypass grafting x2, off-pump on September 21, 2009, for coronary  occlusive disease, which was medically refractory angina.  She is seen  on today's date in routine followup.   Note:  The patient, following her procedure did spend time in a skilled  nursing facility and in fact is being discharged from that facility on  today's date.   She has not been seen in cardiology followup as of yet.  Her overall  status is showing gradual and steady improvement.  She has had physical  and occupational therapy at the facility.  She was on oxygen at times at  the facility, but currently is off.  They did not make any arrangements  for home oxygen.  She is ambulating with a walker, but steadily  improving and requiring it less and less.  She does have a recently  diagnosed urinary tract infection, but denies fevers, chills,  diaphoresis, or other constitutional symptoms.  She will complete this  course at home.   DIAGNOSTIC TESTS:  Chest x-ray was obtained on today's date.  It reveals  some minor bibasilar atelectasis.  There are changes of COPD.  There are  no other acute findings.   PHYSICAL EXAMINATION:  Vital Signs:  Blood pressure 147/81, pulse 66,  respirations 18, and oxygen saturation is 98% on room air.  General  Appearance:  Well-developed adult female, in no acute distress.  Pulmonary:  Clear breath sounds bilaterally.  Cardiac:  Regular rate and  rhythm.  Normal S1 and S2.  Extremities:  No edema.  Incisions are well  healed without evidence of infection.   ASSESSMENT:  The patient is making continued improvements and recovering  well.  She does not feel personally that she is ready to drive at this  time, but I did instruct her as to our typical  protocol and starting  driving when she feels ready.  She also wishes to continue to use the  walker, which is fine.  This primarily at this point is for her comfort  level, but there may be some improved safety issues as well as she has  also had previous hip surgery.  I have encouraged her to begin the  cardiac rehabilitation program.  She will follow up with Dr. [place]  early next week.  She will continue her ongoing medical management by  him as well as her primary physician.  We will see her again on a p.r.n.  basis for any surgically-related issues or at request.   Rowe Clack, P.A.-C.   Sherryll Burger  D:  10/17/2009  T:  10/18/2009  Job:  [id number]   cc:   Peter M. [place], M.D.  Kari Baars, M.D.

## 2010-06-20 NOTE — Discharge Summary (Signed)
[name], [name]              ACCOUNT NO.:  [id number]   MEDICAL RECORD NO.:  [id number]          PATIENT TYPE:  OBV   LOCATION:  3302                         FACILITY:  [facility]   PHYSICIAN:  Guy Franco, P.A.       DATE OF BIRTH:  Oct 03, 1934   DATE OF ADMISSION:  07/15/2007  DATE OF DISCHARGE:  07/16/2007                               DISCHARGE SUMMARY   DISCHARGE DIAGNOSES:  1. Atypical chest pain.  2. Normal coronary arteries, 2006.  3. Hypertension.  4. Hyperlipidemia.  5. Hypokalemia repleted.   HOSPITAL COURSE:  Renee [name] is a 76 year old female who was admitted  yesterday with what she describes as a sharp stabbing chest pain around  10 a.m. with a bad dull pain over the next 6 hours' period.  She was  kept in the hospital overnight.  Cardiac enzymes were negative.  EKG is  nonacute, right bundle-branch block.   However, she had no chest pain and plans are for her to have a Adenosine  Cardiolite in the office today at noon.   She is to continue her medications which included hydrochlorothiazide 25  mg a day, Nexium 40 mg a day, calcium daily, Valium 5 mg daily as  needed, Premarin vaginal cream as directed, fish oil daily, and vitamin  D daily.   She is to be on a low-sodium, heart-healthy diet.   Increase activity slowly and can go to the office today at noon for  stress test and she is to not take anything before the test.      Guy Franco, P.A.     LB/MEDQ  D:  07/16/2007  T:  07/17/2007  Job:  [id number]   cc:   Chrissie Noa D. Clelia Croft, M.D.

## 2010-06-22 ENCOUNTER — Encounter (HOSPITAL_COMMUNITY): Admission: RE | Admit: 2010-06-22 | Payer: Self-pay | Source: Ambulatory Visit

## 2010-06-23 NOTE — Procedures (Signed)
[place]  Patient:    Renee [name], Renee [name]                       MRN: [id number] Proc. Date: 08/19/00 Attending:  Verlin Grills, M.D. CC:         Meade Maw, M.D.   Procedure Report  DATE OF BIRTH:  1934/05/21  REFERRING PHYSICIAN:  Meade Maw, M.D.  PROCEDURE PERFORMED:  Colonoscopy.  ENDOSCOPIST:  Verlin Grills, M.D.  INDICATIONS FOR PROCEDURE:  The patient is a 76 year old female with unexplained chest pain.  She actually felt worse while on proton pump inhibitor medication.  She is now off antireflux medication and feels better. She is referred to rule out peptic ulcer disease.  PREMEDICATION:  Demerol 50 mg, Versed 10 mg.  INSTRUMENT USED:  Olympus gastroscope.  DESCRIPTION OF PROCEDURE:  After obtaining informed consent, [name] was placed in the left lateral decubitus position.  I administered intravenous Demerol and intravenous Versed to achieve conscious sedation for the procedure.  The patients blood pressure, oxygen saturation and cardiac rhythm were monitored throughout the procedure and documented in the medical record. The Olympus gastroscope was passed through the posterior hypopharynx into the proximal esophagus without difficulty.  The hypopharynx, larynx and vocal cords appeared normal.  Esophagogastroduodenoscopy:  The proximal, mid and lower segments of the esophagus appeared normal.  Endoscopically, there is no evidence for the presence of Barretts esophagus, esophageal mucosal scarring, erosive esophagitis or esophageal ulceration.  Gastroscopy:  Retroflex view of the gastric cardia and fundus was normal.  The gastric body appeared normal.  There are scattered mucosal hemorrhages in the distal gastric antrum.  A biopsy was taken from the distal gastric antrum for CLO test to rule out Helicobacter pylori antral gastritis.  The pylorus appeared normal.  Duodenoscopy:  The duodenal bulb  and descending duodenum appeared normal.  ASSESSMENT:  Except for the presence of scattered mucosal hemorrhages in the distal gastric antrum, esophagogastroduodenoscopy was completely normal. There was no endoscopic evidence for the presence of peptic ulcer disease, Barretts esophagus, erosive esophagitis or esophageal mucosal scarring. DD:  08/19/00 TD:  08/19/00 Job: [id number]

## 2010-06-23 NOTE — Cardiovascular Report (Signed)
NAMEANGELYNN, LEMUS              ACCOUNT NO.:  [id number]   MEDICAL RECORD NO.:  [id number]          PATIENT TYPE:  INP   LOCATION:  3737                         FACILITY:  [facility]   PHYSICIAN:  Meade Maw, M.D.    DATE OF BIRTH:  [birth year]   DATE OF PROCEDURE:  DATE OF DISCHARGE:                              CARDIAC CATHETERIZATION   INDICATIONS FOR PROCEDURE:  Chest pain, abnormal stress Cardiolite.   PROCEDURE:  After obtaining written informed consent, the patient was  brought to the cardiac catheterization in the postabsorptive state.  Preoperative sedation was achieved using Versed 2 mg IV and Fentanyl 15 mcg  IV.  The right groin was prepped and draped in the usual sterile fashion.  Local anesthesia was achieved using 1% Xylocaine.  A 6 French hemostasis  sheath was placed into the right femoral artery using the modified Seldinger  technique.  Selective coronary angiography was performed using a JL4 and a  nontorque right.  Multiple views were obtained.  All catheter exchanges were  made over a guidewire.  The hemostasis sheath was placed following each  catheter exchange.  There was no immediate complication.  The patient was  transferred to the holding area.  The hemostasis sheath was removed.  Hemostasis was achieved using a FemoStop device.   FINDINGS:  Aortic pressure was 112/59.  LV pressure 126/9.  EDP 14.  There  was no gradient noted on pullback.  Single plane ventriculogram revealed  normal wall motion, ejection fraction 65 to 70%.   Coronary angiography:  The left main coronary artery bifurcates into the  left anterior descending and circumflex vessel.  There is no disease noted  in the left main coronary artery.   Left anterior descending:  The left anterior descending gives rise to a  trivial D1, a moderate bifurcating D2, goes on to end as an apical branch.  There is no disease noted in the left anterior descending or its branches.   Circumflex vessel:   The circumflex vessel is a moderate size vessel giving  rise to a large OM I, a large OM II and then ending as a trivial AV groove  vessel.  There is no disease noted in the circumflex or its branches.   Right coronary artery:  Right coronary artery is a large dominant artery  giving rise to trivial RV marginals, PDA and a PL branch.  There is luminal  irregularities of up to 20 to 30% in the right coronary artery.   FINAL IMPRESSION:  1.  Normal single plane ventriculogram, ejection fraction 65 to 70%.  2.  Normal coronary angiography.  3.  Noncardiac chest pain.      HP/MEDQ  D:  05/01/2004  T:  05/01/2004  Job:  [id number]   cc:   Carren Rang, M.D.   Sigmund Hazel, M.D.  [name]  [address] [place], [place] [address]  Fax: [phone]

## 2010-06-23 NOTE — Consult Note (Signed)
[name], [name]              ACCOUNT NO.:  [id number]   MEDICAL RECORD NO.:  [id number]          PATIENT TYPE:  EMS   LOCATION:  MAJO                         FACILITY:  [facility]   PHYSICIAN:  Marlan Palau, M.D.  DATE OF BIRTH:  [birth year]   DATE OF CONSULTATION:  11/23/2003  DATE OF DISCHARGE:                                   CONSULTATION   IDENTIFYING INFORMATION:  The patient seen in the emergency room.   HISTORY OF PRESENT ILLNESS:  Renee [name] is a 76 year old right-handed  white female born October 02, 1935 with a history of headaches.  The patient  claims that her usual headache frequency is about once a month or so  commonly having what she calls sinus-type headaches.  This patient, however,  went in for cataract surgery on the 22nd of August 2005 for the left eye and  claims that since that time she has had persistent daily headaches.  The  patient had the right cataract surgery done on the 12th of September 2005.  The patient notes that the headaches are predominately left frontal temporal  in nature.  The patient notes that the headaches are not pounding but more  persistent.  The patient has a lot of scalp tenderness and can touch the  head on either side and has pain radiating to the occipital region.  The  patient does have some neck stiffness, neck pains, had electric shock  sensations that go up into her head.  The patient denies any jaw  claudication problems.  The patient has had elevated sed rates in the 36  range and was set up for a temporal artery biopsy which was done on the left  side and was unremarkable.  The patient has also had an MRI of the brain  that was unremarkable but an MRI angiogram questioned a left A1 segment  aneurysm.  The patient has had referral to [facility] but it was thought  that surgery was not indicated.  The patient was given a trial on Decadron  but seemed to get some improvement with her headache.  The patient now off  of  the steroids but her headaches have gotten worse today.  The patient  denies any significant nausea or vomiting with the headache, has not had any  numbness or weakness on the face, arms, or legs with the exception that she  does have some foot numbness that predates the headache on the left side.  The patient denies any blackout episodes, syncope, seizures.  The patient  has had some halo effect in the eyes following cataract surgery but denies  any other visual field changes.  The patient comes to the emergency room  tonight because she has noted severe headache today.  Neurology is called  for further evaluation.  The patient has been set up to be seen by Dr.  Vickey Huger but has not yet been evaluated.   PAST MEDICAL HISTORY:  1.  History of severe headaches as above.  2.  Bilateral cataract surgery.  3.  Anxiety, depression.  4.  Irritable bowel syndrome.  5.  Status post gallbladder resection.  6.  Degenerative arthritis.  7.  Osteoporosis.  8.  Gastroesophageal reflux disease.  9.  History of urinary tract infections in the past.  10. Mitral valve prolapse.  11. Tonsillectomy.  12. Appendectomy.  13. Hysterectomy.  14. Laryngeal nodule resection.   The patient is on Protonix 40 mg a day and hydrochlorothiazide 25 mg a day.  Is on Percocet if needed for pain.   The patient has ALLERGIES to PENICILLIN, SULFA DRUGS, ASPIRIN, PREDNISONE,  PYRIDIUM.   The patient smokes 1/2 pack of cigarettes a day, does not drink alcohol.   SOCIAL HISTORY:  The patient is married, lives in the [place] area, has  one child who is alive and well.  The patient is followed by Dr. Gweneth Dimitri.   FAMILY MEDICAL HISTORY:  Notable that mother died with MI, had a history of  migraines as well.  Father died from an MI.  The patient's brother that is  alive has leukoplakia and one sister that died with cancer.   REVIEW OF SYSTEMS:  Is notable for some occasional chills, headache as  above.   Does note some sinus drainage.  Does note some neck stiffness.  Denies shortness of breath, chest pain, abdominal pain.  Denies any problems  controlling bowels or bladder.  Has not had any blackouts or seizures.   PHYSICAL EXAMINATION:  VITAL SIGNS:  Blood pressure is 136/92, heart rate  98, respiratory rate 18, temperature afebrile.  GENERAL:  This patient is a thin white female who is alert and cooperative  at the time of examination.  HEENT:  Head is atraumatic.  Eyes:  Pupils are equal, round, react to light.  Disks are flat bilaterally.  NECK:  Supple, no carotid bruits noted.  RESPIRATORY:  Clear.  CARDIOVASCULAR:  Reveals a regular rate and rhythm.  No obvious murmurs or  rubs noted.  EXTREMITIES:  Without significant edema.  NEUROLOGICAL:  Cranial nerves as above.  Facial symmetry is present.  The  patient has good sensation to face to pinprick and soft touch bilaterally.  Has good strength to facial muscles and muscles of the head turning shoulder  shrug bilaterally.  Speech is well enunciated, not aphasic.  Extraocular  movements again are full.  Visual fields are full.  Motor testing reveals  5/5 strength in all fours.  Good symmetric motor tone is noted throughout.  Sensory testing is intact to pinprick, soft touch, vibratory sensation  throughout.  The patient has good finger-nose-finger, toe-to-finger  bilaterally.  Deep tendon reflexes depressed but symmetric throughout.  Toes  are neutral bilaterally.  No pronator drift is seen.  No crepitus is noted  in the temporomandibular joints.  Again range of movement in the neck is  restricted by about 10 to 15 degrees of full lateral rotation bilaterally.  The patient does report tenderness with light touch on the head on either  side.   IMPRESSION:  1.  History of chronic daily headaches.  2.  Recent cataract surgery.  The patient has had a fairly good neurological workup at this point.  No  evidence of intracranial  pathology is noted.  The patient has some question  of a very small 2 mm A1 segment aneurysm but this is at the technical limits  for the MRI angiogram to pick up.  Certainly aneurysm is likely not the  cause of this current symptom complex.  We will initiate treatments for the  headaches at  this point presumably some form of a migraine variant.  No  evidence of sinus problems have been noted.  The patient has been treated  with antibiotics previously.   PLAN:  1.  Give a trial on Tegretol taking 100 mg tablets at 1/2 tablet twice a day      for 5 days then go to 1 full tablet twice a day.  2.  Continue to use Percocet if needed for pain.  3.  Consider additional of a nonsteroidal anti-inflammatory medication if      she can tolerate this.  The patient will follow up with the [facility] over the next several weeks.       CKW/MEDQ  D:  11/23/2003  T:  11/23/2003  Job:  [id number]   cc:   Pam Drown, M.D.  [address]  [place]  [place] [address]  Fax: [phone]

## 2010-06-23 NOTE — Assessment & Plan Note (Signed)
Renee [name]                         GASTROENTEROLOGY OFFICE NOTE   NAME:Renee [name], Renee [name]                     MRN:          [id number]  DATE:01/31/2006                            DOB:          [birth year]    REASON FOR CONSULTATION:  Abdominal discomfort and dark stools.   HISTORY OF PRESENT ILLNESS:  This is a 76 year old white female, retired  Engineer, civil (consulting), who is referred through the courtesy of Dr. Clelia Croft regarding  abdominal discomfort and dark stools.  The patient recently established  with Dr. Clelia Croft as a new patient.   She does have established GI history with Dr. Danise Edge.  She saw  Dr. Laural Benes on a number of occasions several years ago. Apparently she  has been diagnosed with irritable bowel and reflux.   Her current history is that of dark stools occurring on December 7.  Subsequently some problems with mid abdominal burning discomfort.  She  obtained and took her husband's Nexium, which she states has made her  feel better.  She denies a history of ulcer disease.  She denies using  aspirin or nonsteroidal anti-inflammatory drugs.  She denies using Pepto  Bismol.  She was not seen by a physician when she had dark stools, but  rather contacted Dr. Alver Fisher office, who secured this appointment.  The  patient tends to have chronic nausea.  Occasionally she describes some  difficulty swallowing liquids, though not solids.  I do have an upper  endoscopy report dictated by Dr. Laural Benes August 19, 2000.  This was being  performed for unexplained chest pain, which was actually worsened on  proton pump inhibitor therapy.  Examination revealed scattered mucosal  hemorrhages in the gastric antrum.  Otherwise normal.  Testing for  Helicobacter pylori was performed.  The results unknown.  She also  states she had a colonoscopy with Dr. Laural Benes, though his office denies  having a record of such.  She thinks this would have been about 5 years  ago.  She did  undergo an upper GI series January 07, 2002, for abdominal  pain.  This was said to show a nonspecific esophageal motility disorder.  At this time she states that her stools have cleared.  With the dark  stools she denies having had shortness of breath, dizziness, chest pain  or other problems.   PAST MEDICAL HISTORY:  Remarkable for:  1. Hypertension.  2. Hyperlipidemia.  3. Cerebral aneurysm.  4. B12 deficiency.  5. Irritable bowel with a tendency towards constipation.  6. Question reflux disease/esophageal motility disorder.   PAST SURGICAL HISTORY:  1. Status post cholecystectomy 2 years ago.  2. Status post hysterectomy.  3. Status post tubal ligation.  4. Status post appendectomy.  5. Status post tonsillectomy.   ALLERGIES:  1. PENICILLIN.  2. ASPIRIN.  3. SULFA.  4. PYRIDIUM.  5. PREDNISONE.   CURRENT MEDICATIONS:  1. Nexium, unspecified dosage.  2. Hydrochlorothiazide, unspecified dosage.  3. B12 injection 1000 mcg monthly.   FAMILY HISTORY:  Brother with colon polyps.  No gastrointestinal  malignancy.  Sister with breast cancer.   SOCIAL HISTORY:  The  patient is married with one daughter.  She lives  with her spouse.  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  She smokes one pack of cigarettes per day.  She denies alcohol use.   REVIEW OF SYSTEMS:  Per diagnostic evaluation form.   PHYSICAL EXAMINATION:  Well-appearing female in no acute distress.  Blood pressure 124/76, heart rate is 75 and regular, weight is 156.8  pounds.  She is 5 feet 6 inches in height.  HEENT:  Sclerae are anicteric, conjunctivae are pink, oral mucosa is  intact, there is no adenopathy.  LUNGS:  Clear.  HEART:  Regular.  ABDOMEN:  Soft without tenderness, mass or hernia.  Good bowel sounds  heard.  EXTREMITIES:  Are without edema.  RECTAL:  Exam reveals external hemorrhoids.  Stool soft, brown and   Hemoccult-negative.  No internal mass or tenderness.   IMPRESSION:  1. Recent problems with epigastric burning discomfort and dark stools.      Question ulcer with melena, now resolved on Nexium.  2. History of irritable bowel with tendency towards constipation.  3. Question of reflux disease/esophageal motility disorder.  4. Multiple general medical problems.   RECOMMENDATIONS:  1. Continue empiric proton pump inhibitor therapy once daily for the      time being.  2. Obtain CBC today to rule out anemia.  3. Schedule upper endoscopy.  4. Consider colonoscopy if endoscopy unrevealing or the patient is      anemic without obvious cause.     Renee [name]. Marina Goodell, MD  Electronically Signed    JNP/MedQ  DD: 02/03/2006  DT: 02/03/2006  Job #: [id number]   cc:   Kari Baars, M.D.

## 2010-06-23 NOTE — H&P (Signed)
[place]. [facility]  Patient:    Renee, [name]                     MRN: [id number] Adm. Date:  [id number] Attending:  Meade Maw A Dictator:   Anselm Lis, N.P. CC:         Dr. Thyra Breed  Dr. Charolett Bumpers III   History and Physical  PRIMARY CARE [place]:  Dr. Al Decant. [place]/Dr. Marny Lowenstein.  GASTROINTESTINAL PHYSICIAN:  Dr. Charolett Bumpers III.  DATE OF BIRTH:  1935/01/23  ADMISSION DIAGNOSES (as dictated by Dr. Hillary Bow): 1. Symptoms of unstable angina pectoris in this 76 year old female with risk    factors for coronary artery disease including age, borderline hypertension,    positive family history of coronary artery disease, and long tobacco    history.  This morning she developed upper abdominal burning resolving    after eating but subsequent lower substernal/epigastric pressure which    radiated across her left chest into bilateral neck and bilateral lower    gums, as well as to posterior shoulder blade.  She had slight nausea but    denied shortness of breath nor diaphoresis.  Discomfort waxed and waned but    never completely went away.  She had one episode of brief, sharp, left    anterior chest sharp shooting pain which "took her breath away" radiating    to her back.  Last episode of this was earlier this month, did not last as    long, and with recurrence precipitated evaluation by Dr. Fraser Din.  She was    scheduled to have had a follow-up stress Cardiolite which has yet to take    place.  She has a history of prior adenosine Cardiolite two years earlier    which was negative for ischemia with good ejection fraction 64% and no wall    motion abnormalities [facility] in [place]).  Her first set of    heart enzymes are negative.  EKG reveals right bundle-branch block which is    her baseline with flipped T waves/"strain" pattern which is unchanged from    prior EKG a few years earlier.   She currently continues episodic chest    discomfort but is currently pain-free. 2. History of anxiety disorder/situational depression associated with death of    her mother and sister.  She has been under psychiatric care in the past but    had gastrointestinal intolerance to antidepressants.  She currently takes    Valium p.r.n. no more than a few times per week. 3. History of irritable bowel syndrome followed by Dr. Danise Edge in the    past; prior colonoscopy was okay. 4. History of arthritis/osteoporosis; predominantly discomfort affects her    feet.  She has neck popping and snapping sounds but no pain. 5. Diagnosed clinically with gastroesophageal reflux disease; has never had an    endoscopy.  She does have symptoms of nausea and emesis about one to two    times per week post prandial not purely associated with type of food eaten. 6. Borderline hypertension; taking hydrochlorothiazide, small dose. 7. Long history of tobacco abuse. 8. History of urinary tract infections followed by Dr. Isabel Caprice about two months    ago; status post course of antibiotics for three weeks which finally seemed    to resolve all her UTI symptoms. 9. History of mitral valve prolapse by remote echocardiogram; bacterial  endocarditis prophylaxis in place.  PLAN: 1. Will transfer to [facility], direct admit to 3741, telemetry. 2. Rule out MI protocol with serial cardiac enzymes and daily EKG. 3. Medications:  Aspirin, IV heparin, IV nitrates, Protonix, continue p.r.n.    Valium. 4. Cardiolite versus catheterization. 5. Would recommend outpatient GI evaluation with EGD.  PREVIOUS SURGICAL HISTORY:  Appendectomy, tonsillectomy, abdominal hysterectomy with BSO.  She has had past resection of benign laryngeal nodule about four years earlier.  ALLERGIES:  ASPIRIN causing GI upset; PENICILLIN; SULFA; PREDNISONE causing GI upset, rash, and pruritus.  MEDICATIONS: 1. Valium 2.5 to 5 mg p.o. p.r.n.  which she takes a few times per week. 2. Hydrochlorothiazide 12.5  p.o. q.d. (one-half of a 25 mg tablet). 3. Climara hormone patch every seven days but she has not taken this for a    week because she ran out. 4. Prilosec 20 mg p.o. q.d. but none for greater than three weeks.  SOCIAL HISTORY AND HABITS:  Tobacco:  One pack per day for 40 years.  ETOH: Negative.  Retired Estate agent.  She has been married for 17 years.  Has one daughter from prior marriage - Alfredia Client.  She has three stepchildren.  FAMILY HISTORY:  Father died age 9 of myocardial infarction.  He had his first heart attack at age 30.  He had cardiac enlargement.  Mother died age 78 of old age.  End-of-life issues including CHF and renal failure.  Patient has one brother who has leukoplakia of the mouth; has not progressed to cancer. She has a sister deceased age 61 of metastatic cancer.  REVIEW OF SYSTEMS:  As in HPI/previous medical history.  Otherwise, episodic lightheadedness with fast position changes or stooping over.  No problems with hearing.  Wears eyeglasses.  Has full upper dentures and lower crowns. Negative dysphagia to food or fluid.  She does have episodic (a few times a week) post prandial nausea and emesis which has been tagged as GERD.  She has not taken her Prilosec in the past few weeks as she did not feel an appreciable change in her symptoms with this medication.  Has problems with episodic diarrhea/constipation.  No melena nor bright red blood PR. Colonoscopy in the past which was okay.  She had a negative gallbladder ultrasound two to three weeks earlier.  Denies current symptoms of UTI though has had frequent episodes in the past.  Negative pedal edema, unless rare dependent in nature.  Negative orthopnea nor PND.  Normally no problems with energy, but all day today has felt excessively fatigued with lower extremity fatigue and left foot numbness.  She has chronic numbness last two fingers  of her left hand of uncertain etiology.  Osteoarthritis as mentioned above.   PHYSICAL EXAMINATION (as performed by Dr. Hillary Bow):  VITAL SIGNS:  Blood pressure 166/89, heart rate 78 and regular, respiratory rate 14, temperature 97.1.  GENERAL:  She is a slender 76 year old female accompanied by her husband and daughter.  She appears nervous.  HEENT:  Brisk bilateral carotid upstroke without bruit.  NECK:  No significant JVD nor thyromegaly.  CARDIAC:  Regular rate and rhythm with mid systolic click, suggestive of mitral valve prolapse.  CHEST:  Bibasilar crackles, negative CPA tenderness.  ABDOMEN:  Soft, nondistended, normal active bowel sounds.  Negative abdominal aorta, renal, nor femoral bruit.  Nontender to applied pressure.  No masses, no organomegaly appreciated.  EXTREMITIES:  Reveal +2/4 bilateral radial, femoral, dorsalis pedis, and posterior tibial  pulses.  Negative pedal edema.  NEUROLOGIC:  Cranial nerves 2-12 grossly intact.  Affect somewhat flat/nervous-appearing.  She does not offer much information but answers questions coherently but not always completely.  Does not augment information offered.  GENITAL/RECTAL:  Deferred.  LABORATORY TESTS AND DATA:  Sodium 142, K 3.9, chloride 108, CO2 28, BUN 9, creatinine 0.8, glucose 99.  LFTs within normal range.  UA was negative. Hemoglobin 13.9, hematocrit 41.5, WBC 5.4, platelets 272.  Troponin I is less than 0.01, CK 57, MB fraction 0.3.  Chest x-ray revealed mild bibasilar atelectasis, left greater than right.  EKG revealed NSR with incomplete right bundle-branch block, T wave inversion lead III.  No ischemic ST segment changes.  No significant change from EKG Feb 20, 1998 though resolution of T wave inversion/strain pattern aVF.  Persantine Cardiolite from February 2002 at [facility] in [place] was negative for ischemia; EF 64%.  Negative wall motion abnormality. DD:  07/05/00 TD:   07/05/00 Job: [id number]

## 2010-06-23 NOTE — H&P (Signed)
NAMEEMPRESS, NEWMANN              ACCOUNT NO.:  [id number]   MEDICAL RECORD NO.:  [id number]          PATIENT TYPE:  INP   LOCATION:  6529                         FACILITY:  [facility]   PHYSICIAN:  Peter M. Swaziland, M.D.  DATE OF BIRTH:  09-17-1934   DATE OF ADMISSION:  04/27/2004  DATE OF DISCHARGE:                                HISTORY & PHYSICAL   HISTORY OF PRESENT ILLNESS:  Renee [name] is a pleasant 76 year old white  female who presents to the emergency room today for evaluation of chest  pain. She was apparently at the neurology office today when she complained  of headache. She then developed midsubsternal chest pain radiating into her  left chest and back and associated with left jaw pain. She also had some arm  discomfort. The pain lasted greater than 30 minutes and was relieved when  she arrived in the emergency room with sublingual nitroglycerin x 2. Now,  the pain has recurred but at a lower level. She had no associated shortness  of breath, nausea, vomiting or diaphoresis. Of note, the patient had  extensive cardiac workup in 2004 after she had experienced a syncopal  episode. This included a Holter monitor, echocardiogram, and a stress  Cardiolite study all of which were apparently unremarkable.   PAST MEDICAL HISTORY:  1.  Migraine headaches.  2.  Small cerebral aneurysm.  3.  Hypertension.  4.  Gastroesophageal reflux disease.   PAST SURGICAL HISTORY:  1.  Previous appendectomy.  2.  T&A.  3.  Complete hysterectomy.  4.  Prior cholecystectomy.   ALLERGIES:  She is allergic to PENICILLIN, SULFA, and PERIDIUM. She states  ASPIRIN makes her stomach burn.   CURRENT MEDICATIONS:  Dyazide and Protonix daily.   SOCIAL HISTORY:  The patient smokes one pack per day. Has been a smoker for  50 years. 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 She denies alcohol use.   FAMILY HISTORY:  Mother died at age 57 with congestive heart failure. Father  died at age 67 with diabetes  and myocardial infarction. One brother is alive  and well. One sister died with cancer.   REVIEW OF SYMPTOMS:  She has had problems with headaches in the past. They  have been described as migraines. She has no aura. She has not been on  regular medication for headache. She states her blood pressure has always  been borderline. She was started on Dyazide several years ago for mild edema  in her ankles. Other review of systems are negative.   PHYSICAL EXAMINATION:  GENERAL:  The patient is a thin, white female in no  distress.  VITAL SIGNS:  Blood pressure 156/64, pulse 75 and regular. She is afebrile.  Saturations are 99% on room air.  HEENT:  Normocephalic and atraumatic. Pupils are equal, round, and reactive  to light and accommodation. Sclerae are clear. She wears glasses. Oropharynx  is clear.  NECK:  Supple without JVD, adenopathy, thyromegaly, or bruits.  LUNGS:  Clear to auscultation and percussion.  CARDIOVASCULAR:  Regular rate and rhythm without murmur, rub, or gallop.  ABDOMEN:  Soft and nontender. There are no masses or bruits.  EXTREMITIES:  Without edema. Pulses are 2+ and symmetric.  NEUROLOGICAL:  Intact.   LABORATORY DATA:  Chest x-ray shows slight left lower lobe atelectasis.  Otherwise clear. ECG shows possible ectopic atrial rhythm and right bundle  branch block which is unchanged from 2005.   Point of care cardiac enzymes are negative x 2. Coagulases are normal. White  count is 5700, hemoglobin 15.3, hematocrit 43.9, platelets 220,000. Sodium  136, potassium 3.3, chloride 104, CO2 27, BUN 11, creatinine 0.8, glucose of  94. LFTs are normal.   IMPRESSION:  1.  Prolonged chest pain and jaw pain consistent with unstable angina:  No      acute echocardiogram changes and initial cardiac enzymes are negative.      Possibly also related to acid reflux disease.  2.  Hypertension.  3.  Gastroesophageal reflux disease.  4.  History of small cerebral aneurysm.  5.   Status post cholecystectomy.   PLAN:  We will admit to telemetry. We will rule out myocardial infarction.  We will continue IV nitroglycerin and heparin pending serial enzymes. We  will replete her potassium. Further plans per Dr. Marlene Lard in the  morning.      PMJ/MEDQ  D:  04/27/2004  T:  04/28/2004  Job:  [id number]   cc:   Marlene Lard, M.D.   Melvyn Novas, M.D.  [address]  [address]  [place]  [place] [address]  Fax: [phone]

## 2010-06-23 NOTE — Discharge Summary (Signed)
NAMEELYSSA, Renee [name]              ACCOUNT NO.:  [id number]   MEDICAL RECORD NO.:  [id number]          PATIENT TYPE:  INP   LOCATION:  3737                         FACILITY:  [facility]   PHYSICIAN:  Meade Maw, M.D.    DATE OF BIRTH:  Apr 23, 1934   DATE OF ADMISSION:  04/27/2004  DATE OF DISCHARGE:  05/01/2004                                 DISCHARGE SUMMARY   ADMISSION DIAGNOSIS:  1.  Chest pain.  2.  Hypertension.  3.  Gastroesophageal reflux disease.   DISCHARGE DIAGNOSIS:  1.  Chest pain, negative for myocardial infarction.  2.  Abnormal Cardiolite study.  3.  Normal coronary arteries by cardiac catheterization.  4.  Hypertension controlled.  5.  Gastroesophageal reflux disease, continue proton pump inhibitor therapy.   PROCEDURE:  1.  Adenosine Cardiolite study April 28, 2004.  2.  Cardiac catheterization May 01, 2004.   COMPLICATIONS:  None.   DISCHARGE STATUS:  Stable, improved.   ADMISSION HISTORY:  Please see complete H&P for details, but in short, this  is a 76 year old female with a history of hypertension and GERD who was  admitted with chest pain on April 28, 2004.  The pain radiated to the left  axilla and left jaw that she states she felt was different from her usual  reflux symptoms.  She went to the emergency room  for further evaluation.  She was given two sublingual nitroglycerin with complete relief.  She was  started on IV nitroglycerin.   PHYSICAL EXAMINATION ON ADMISSION:  Please see complete H&P for details, but  in short, her vital signs were stable, she was afebrile.   EKG showed sinus rhythm with right bundle branch block and nonspecific  inferior ST-T wave changes.  Cardiac markers were negative x 2 sets.  Two  sets of cardiac enzymes were negative.  CBC and chemistry were normal.   Adenosine Cardiolite study was scheduled to rule out ischemic cause.  This  was done on April 28, 2004.  After review by Dr. Fraser Din, there was some  evidence  of inferoapical ischemia.  Plans for cardiac catheterization were  made.  The patient was kept on IV heparin.  The patient had intermittent  episode of milder chest discomfort over the next couple of days.  No  ischemic changes on EKG were noted.  Vital signs remained stable.  She was  given morphine intermittently for recurrent chest pain.  Finally, on May 01, 2004, she was taken to the cardiac cath lab by Dr. Fraser Din.  The results  showed normal coronary arteries and normal LV systolic function.  She was  discharged to home without further incident.   DISCHARGE MEDICATIONS:  Protonix 40 mg daily, Dyazide 1 tablet daily.   DISCHARGE INSTRUCTIONS:  She is instructed not to undergo any strenuous  physical activities for the next few days.  She is not to lift anything more  than 5 pounds.  No bending, stooping, or straining.  She is  not to drive  the day of discharge or the following day.  She may resume her normal  activities on  Thursday, May 04, 2004.  She is to maintain a low salt diet.  She may shower the day after discharge and gently was her right groin cath  site, pat dry but she was instructed not to rub the area dry.  She was  encouraged to stop smoking.  She has an appointment to see Dr. Fraser Din back  for follow up on Wednesday, May 17, 2004, at 10 a.m.      HB/MEDQ  D:  06/30/2004  T:  06/30/2004  Job:  [id number]

## 2010-06-23 NOTE — Op Note (Signed)
Renee [name], Renee [name]                        ACCOUNT NO.:  [id number]   MEDICAL RECORD NO.:  [id number]                   PATIENT TYPE:  OIB   LOCATION:  5735                                 FACILITY:  [facility]   PHYSICIAN:  Lorre Munroe., M.D.            DATE OF BIRTH:  Feb 09, 1934   DATE OF PROCEDURE:  05/19/2003  DATE OF DISCHARGE:                                 OPERATIVE REPORT   PREOPERATIVE DIAGNOSIS:  Biliary dyskinesia.   POSTOPERATIVE DIAGNOSIS:  Biliary dyskinesia.   OPERATION:  Laparoscopic cholecystectomy.   SURGEON:  Zigmund Daniel, M.D.   ASSISTANT:  Ollen Gross. Carolynne Edouard, M.D.   ANESTHESIA:  General.   PROCEDURE:  After the patient was monitored and anesthetized and had routine  preparation and draping of the abdomen, I infused local anesthetic at four  sites for insertion of ports.  I made a short transverse incision just below  the umbilicus and dissected down to the midline fascia and opened it  longitudinally  and then opened the peritoneum bluntly.  After inserting a  finger in to assure there was no adherent viscera in the region, I inserted  a Hasson cannula and inflated the abdomen with CO2.  The laparoscopy  demonstrated adhesions of the omentum to the under surface of the  gallbladder and also that the duodenum was densely adherent to the under  surface of the gallbladder.  There were some chronic adhesions of fat to the  anterior abdominal wall in the lower abdomen.  After placement of three  additional ports under direct vision and placement of the patient head up  feet down and tilted to the left, I dissected the adhesions away from the  gallbladder.  I very carefully dissected the duodenum off utilizing the  scissors and felt that it was uninjured.  I then grasped the infundibulum of  the gallbladder and pulled it laterally and dissected out the cystic duct.  I clipped the cystic duct as it emerged from the infundibulum of the  gallbladder.  I found  it to be a very tiny duct.  Because of the patient's  rather severe episodes of pain, I wanted to do a cholangiogram and made a  small cut in the cystic duct with a clip.  I could clearly see the lumen but  it was so tiny that I could not cannulate it with a cholangiogram catheter.  The bile which came out was clear and free of sludge.  I then abandoned the  attempt at cholangiogram and clipped the distal cystic duct with two clips  and divided it.  I found two branches of the cystic artery and clipped and  divided them and then dissected the gallbladder from the liver using the  cautery with the hook instrument.  I gained hemostasis with the same  instrument.  I accidentally made one small hole in the gallbladder and a  little bit of bile spilled  out and I suctioned that away and subsequently  copiously irrigated.  After detaching the gallbladder from the liver and  getting excellent hemostasis in the gallbladder fossa, I placed the  gallbladder in a plastic pouch and removed it through the umbilical incision  and tied the purse-string suture.  I irrigated the right upper quadrant a  little more and removed the irrigant until it came out clear.  The sponge,  needle and instrument  counts were correct.  I removed the lateral ports under direct vision and  then allowed the CO2 to escape and removed the epigastric port.  I closed  all skin incisions with intracuticular 4-0 Vicryl and Steri-Strips.  The  patient tolerated the procedure well.                                               Lorre Munroe., M.D.    WB/MEDQ  D:  05/19/2003  T:  05/19/2003  Job:  [id number]   cc:   Sigmund Hazel, M.D.  [address]  [address] [place], [place] [address]  Fax: [phone]

## 2010-06-23 NOTE — Discharge Summary (Signed)
[place] [facility]  Patient:    Renee [name], Renee [name]                     MRN: [id number] Adm. Date:  [id number] Disc. Date: [id number] Attending:  Meade Maw A Dictator:   Anselm Lis, N.P. CC:         Al Decant. Janey Greaser, M.D.  Jethro Bastos, M.D.  Charolett Bumpers III, M.D.   Discharge Summary  DISCHARGE DIAGNOSES:  1. Chest pain with typical/atypical features.     a. Ruled out for myocardial infarction by negative serial cardiac enzymes.        Electrocardiograms were nonischemic.     b. Adenosine Cardiolite study on July 08, 2000, negative for ischemia,        ejection fraction 70%.  Questionable discomfort, muscular/skeletal        versus gastrointestinal in etiology.  2. History of anxiety disorder/situational depression, by mouth Valium with     intolerance to antidepressants.  3. History of irritable bowel syndrome followed by Dr. Danise Edge, prior     colonoscopy okay.  4. History of arthritis/osteoporosis, primarily effecting feet.  5. History of gastroesophageal reflux disease with nausea and emesis     episodically one to two times per week postprandial.  6. Borderline hypertension, Toprol added to medical regimen.  7. History of tobacco abuse; not totally committed to smoking cessation.  8. History of urinary tract infections followed by Dr. Isabel Caprice.  9. History of mitral valve prolapse by remote echocardiogram. 10. Bacterial endocarditis, prophylaxis in place. 11. Right bundle branch block.  DISPOSITION:  The patient is discharged home in stable condition.  DISCHARGE MEDICATIONS: 1. (New) Toprol XL 100 mg p.o. q.d. 2. Hydrochlorothiazide 25 mg 1/2 p.o. q.d. 3. Climara hormone patch apply to chest once weekly. 4. Prilosec 20 mg p.o. q.d. 5. Valium 2.5 to 5 p.r.n. anxiety.  DIET:  Low fat, low cholesterol diet with no added salt.  FOLLOWUP:  Our clinic will call her to schedule follow-up appointment to be seen by Dr.  Fraser Din in four to six weeks.  HISTORY OF PRESENT ILLNESS:  Please see dictated H&P.  This is a 76 year old female who has factors for CAD including age, borderline hypertension, positive family history for CAD and long history of tobacco use who developed upper abdominal burning resolving after eating, but subsequent lower substernal/epigastric pressure radiating across left chest and into bilateral neck and bilateral lower gums as well as posterior shoulder blade.  She had associated nausea.  Her discomfort waxed and waned.  She also had a single episode of sharp shooting discomfort in left anterior chest which "took her breath away."  She also has a history of prior Adenosine Cardiolite two years early which was negative for ischemia including EF of 64%.  Her EKG revealed right bundle branch block which is her baseline with flipped T wave "strain" pattern which was unchanged from prior EKG two years earlier.  HOSPITAL COURSE:  Throughout the hospital course, she continued with some episodic anterior chest discomfort.  She was treated during course of admission with IV heparin and nitrates.  She did rule out for MI by negative serial cardiac enzymes.  On July 08, 2000, she underwent Adenosine Cardiolite which was negative for evidence of myocardial ischemia with good ejection fraction of 70%.  She is discharged to home.  Plans are to follow up with Dr. Fraser Din in about one month.  LABORATORY DATA AND X-RAY  FINDINGS:  Serial cardiac enzymes were negative x 2 sets.  Sodium 142, potassium 3.9, chloride 108, CO2 28, BUN 9, creatinine 0.8, glucose 99.  LFTs within normal range.  UA was negative.  Hemoglobin 13.9 with hematocrit of 41.5, WBC 5.4, platelets 271.  Troponin I of less than 0.01 with CK of 57 and MB fraction of 0.03.  Chest x-ray revealed mild bibasilar atelectasis, left greater than right.  EKG revealed NSR with incomplete right bundle branch block, T wave inversion in lead III.   No ischemic ST segment changes.  No significant change from EKG on February 21, 2000, although resolution of T wave inversion/strain pattern aVF.  Persantine Cardiolite from February 2000, at [facility] in [place] was negative for ischemia with EF of 64%.  Negative wall motion abnormality.  Total time of preparing discharge was greater than 30 minutes including dictation of this discharge summary, writing out and reviewing discharge instructions with patient. DD:  07/08/00 TD:  07/09/00 Job: [id number]

## 2010-06-26 ENCOUNTER — Other Ambulatory Visit: Payer: Self-pay | Admitting: Cardiology

## 2010-06-27 ENCOUNTER — Encounter (HOSPITAL_COMMUNITY): Payer: Self-pay

## 2010-06-27 NOTE — Telephone Encounter (Signed)
Came by office wanting Dr. Swaziland to give her a "long acting" diuretic. States she feels swollen in face and feet at night. Per Dr. Swaziland there is not a long acting diuretic and needs to stay on current med. Received refill from [facility] will send that in.

## 2010-06-29 ENCOUNTER — Encounter (HOSPITAL_COMMUNITY): Payer: Self-pay

## 2010-06-30 ENCOUNTER — Encounter: Payer: Self-pay | Admitting: *Deleted

## 2010-06-30 ENCOUNTER — Ambulatory Visit (INDEPENDENT_AMBULATORY_CARE_PROVIDER_SITE_OTHER): Payer: Medicare Other | Admitting: Adult Health

## 2010-06-30 DIAGNOSIS — J449 Chronic obstructive pulmonary disease, unspecified: Secondary | ICD-10-CM

## 2010-06-30 NOTE — Patient Instructions (Signed)
Finish Cipro Fluids and rest  Delsym or Hydromet As needed  Cough Please contact office for sooner follow up if symptoms do not improve or worsen or seek emergency care  follow up Dr. Delford Field  As planned and As needed

## 2010-06-30 NOTE — Progress Notes (Signed)
  Subjective:    Patient ID: Renee [name], female    DOB: July 15, 1934, 76 y.o.   MRN: [id number]  HPI 76 yo female with known hx of COPD   January 18, 2010 1:52 PM  The pt is now on spiriva daily and oxygen therapy.  The pt lives in a townhouse, now can walk without the oxygen.  Pt likes the spiriva as far as lungs are concerned. Pt got hoarse on spiriva but is taking too fast a breath on the inhaler   March 13, 2010 11:48 AM  Pt is improved. Pt notes some soreness in the throat. There is no cough. THere is no excess mucus. The spiriva causes hoarseness.    06/30/10 Acute OV  Pt presents for an acute office visit. Complains of chest congestion, prod cough with green mucus, hoarseness, increased SOB, wheezing x1week. Was seen by PCP and  given cipro 250mg  [phone] (has 4 days left) and hydrmet . SHe is feeling some better but cugh is not completely gone. Mucus is clearing. No chest pain or hemoptysis. Cough syrup is helping some.   Review of Systems Constitutional:   No  weight loss, night sweats,  Fevers, chills, fatigue, or  lassitude.  HEENT:   No headaches,  Difficulty swallowing,  Tooth/dental problems, or  Sore throat,                No sneezing, itching, ear ache, nasal congestion, post nasal drip,   CV:  No chest pain,  Orthopnea, PND, swelling in lower extremities, anasarca, dizziness, palpitations, syncope.   GI  No heartburn, indigestion, abdominal pain, nausea, vomiting, diarrhea, change in bowel habits, loss of appetite, bloody stools.   Resp:    No coughing up of blood.    No chest wall deformity  Skin: no rash or lesions.  GU: no dysuria, change in color of urine, no urgency or frequency.  No flank pain, no hematuria   MS:  No joint pain or swelling.  No decreased range of motion.   Psych:  No change in mood or affect. No depression or anxiety.            Objective:   Physical Exam GEN: A/Ox3; pleasant , NAD, elderly  HEENT:  [place]/AT,  EACs-clear, TMs-wnl,  NOSE-clear, THROAT-clear, no lesions, no postnasal drip or exudate noted.   NECK:  Supple w/ fair ROM; no JVD; normal carotid impulses w/o bruits; no thyromegaly or nodules palpated; no lymphadenopathy.  RESP  Coarse BS w/ no wheezing no accessory muscle use, no dullness to percussion  CARD:  RRR, no m/r/g  , no peripheral edema, pulses intact, no cyanosis or clubbing.  GI:   Soft & nt; nml bowel sounds; no organomegaly or masses detected.  Musco: Warm bil, no deformities or joint swelling noted.   Neuro: alert, no focal deficits noted.    Skin: Warm, no lesions or rashes         Assessment & Plan:

## 2010-06-30 NOTE — Assessment & Plan Note (Addendum)
Slow to resolve flare   Plan  Finish Cipro Fluids and rest  Delsym or Hydromet As needed  Cough Please contact office for sooner follow up if symptoms do not improve or worsen or seek emergency care  follow up Dr. Delford Field  As planned and As needed

## 2010-07-02 ENCOUNTER — Other Ambulatory Visit: Payer: Self-pay | Admitting: Cardiology

## 2010-07-04 ENCOUNTER — Encounter (HOSPITAL_COMMUNITY): Payer: Self-pay

## 2010-07-04 ENCOUNTER — Telehealth: Payer: Self-pay | Admitting: Cardiology

## 2010-07-04 NOTE — Telephone Encounter (Signed)
Denied refill. Needs to go to Dr. Buren Kos

## 2010-07-04 NOTE — Telephone Encounter (Signed)
PT SAID HER REQUEST FOR HER THYROID MED TO BE FILLED WAS DENIED BY OUR OFFICE AND SHE DOES NOT UNDERSTAND WHY. DR Swaziland WROTE THE ORIGINAL SCRIPT. PT IS DOWN TO ONE PILL AND NEEDS TO KNOW WHAT TO DO. USES [facility] AT [address] IN [place].

## 2010-07-04 NOTE — Telephone Encounter (Signed)
Called wanting to know why we didn't refill her thyroid meds. Advised sent to PCP.

## 2010-07-06 ENCOUNTER — Encounter (HOSPITAL_COMMUNITY): Payer: Self-pay

## 2010-07-11 ENCOUNTER — Encounter (HOSPITAL_COMMUNITY): Payer: Medicare Other

## 2010-07-13 ENCOUNTER — Encounter (HOSPITAL_COMMUNITY): Payer: Medicare Other

## 2010-07-18 ENCOUNTER — Encounter (HOSPITAL_COMMUNITY): Payer: Medicare Other

## 2010-07-20 ENCOUNTER — Encounter (HOSPITAL_COMMUNITY): Payer: Medicare Other

## 2010-07-25 ENCOUNTER — Encounter (HOSPITAL_COMMUNITY): Payer: Medicare Other

## 2010-07-27 ENCOUNTER — Encounter (HOSPITAL_COMMUNITY): Payer: Medicare Other

## 2010-08-01 ENCOUNTER — Encounter (HOSPITAL_COMMUNITY): Payer: Medicare Other

## 2010-08-03 ENCOUNTER — Encounter (HOSPITAL_COMMUNITY): Payer: Medicare Other

## 2010-08-08 ENCOUNTER — Encounter (HOSPITAL_COMMUNITY): Payer: Medicare Other

## 2010-08-10 ENCOUNTER — Encounter (HOSPITAL_COMMUNITY): Payer: Medicare Other

## 2010-08-14 ENCOUNTER — Encounter: Payer: Self-pay | Admitting: Critical Care Medicine

## 2010-08-14 ENCOUNTER — Ambulatory Visit (INDEPENDENT_AMBULATORY_CARE_PROVIDER_SITE_OTHER): Payer: Medicare Other | Admitting: Critical Care Medicine

## 2010-08-14 VITALS — BP 116/80 | HR 81 | Temp 98.6°F | Ht 66.0 in | Wt 160.4 lb

## 2010-08-14 DIAGNOSIS — J449 Chronic obstructive pulmonary disease, unspecified: Secondary | ICD-10-CM

## 2010-08-14 MED ORDER — TIOTROPIUM BROMIDE MONOHYDRATE 18 MCG IN CAPS: 18.0000 ug | ORAL_CAPSULE | Freq: Every day | RESPIRATORY_TRACT | Status: DC

## 2010-08-14 NOTE — Assessment & Plan Note (Addendum)
Stable copd Plan No change in inhaled or maintenance medications. Recheck ONO on RA Return in   4 months

## 2010-08-14 NOTE — Progress Notes (Signed)
Subjective:    Patient ID: Renee [name], female    DOB: 01/26/1935, 76 y.o.   MRN: [id number]  HPI  76 yo female with known hx of COPD   January 18, 2010 1:52 PM  The pt is now on spiriva daily and oxygen therapy.  The pt lives in a townhouse, now can walk without the oxygen.  Pt likes the spiriva as far as lungs are concerned. Pt got hoarse on spiriva but is taking too fast a breath on the inhaler   March 13, 2010 11:48 AM  Pt is improved. Pt notes some soreness in the throat. There is no cough. THere is no excess mucus. The spiriva causes hoarseness.    06/30/10 Acute OV  Pt presents for an acute office visit. Complains of chest congestion, prod cough with green mucus, hoarseness, increased SOB, wheezing x1week. Was seen by PCP and  given cipro 250mg  x14days (has 4 days left) and hydrmet . SHe is feeling some better but cugh is not completely gone. Mucus is clearing. No chest pain or hemoptysis. Cough syrup is helping some.   08/14/2010 Was seen end of may due to sinusitis.  Still with thick yellow mucus.  Dyspnea is better.  Uses oxygen at night,  Prn during the day. No real chest pain.    Past Medical History  Diagnosis Date  . GERD (gastroesophageal reflux disease)   . IBS (irritable bowel syndrome)   . Bundle branch block, right   . Coronary artery disease   . Emphysema/COPD   . Fatigue   . SOB (shortness of breath)   . Bruises easily   . Joint pain   . Weakness   . Anxiety   . Pain on voiding   . AF (atrial fibrillation)   . Fracture of toe of left foot   . HTN (hypertension)   . CHF (congestive heart failure)   . Dysphagia   . Colon polyps      Family History  Problem Relation Age of Onset  . Heart disease Mother   . Transient ischemic attack Mother   . Heart attack Father   . Hypertension Father   . Diabetes Father   . Breast cancer Sister   . Other Sister     brain tumor  . Colon polyps Brother   . Colon cancer Neg Hx      History   Social  History  . Marital Status: Married    Spouse Name: N/A    Number of Children: 1  . Years of Education: N/A   Occupational History  . nurse     retired   Social History Main Topics  . Smoking status: Former Smoker -- 1.0 packs/day for 50 years    Types: Cigarettes    Quit date: 09/21/2009  . Smokeless tobacco: Never Used  . Alcohol Use: No  . Drug Use: No  . Sexually Active: Not on file   Other Topics Concern  . Not on file   Social History Narrative   Daily caffeine use: Dr Reino Kent and 2 cups coffee per day     Allergies  Allergen Reactions  . Morphine     REACTION: double vision  . Penicillins     REACTION: swelling/rash  . Phenazopyridine Hcl     *piridium* REACTION: face red  . Pneumococcal Vaccine     REACTION: rash and cough  . Prednisone     REACTION: rash  . Sulfonamide Derivatives     REACTION:  rash  . Tylenol (Acetaminophen)     Tylenol #3 -- rash and swelling     Outpatient Prescriptions Prior to Visit  Medication Sig Dispense Refill  . ALPRAZolam (XANAX) 0.5 MG tablet Take 1 tablet by mouth as needed.      [name] aspirin 81 MG tablet Take 81 mg by mouth daily.        . Bisacodyl (DULCOLAX PO) Take by mouth as needed.        . diazepam (VALIUM) 5 MG tablet Take 5 mg by mouth every 6 (six) hours as needed.        [name] esomeprazole (NEXIUM) 40 MG capsule Take 40 mg by mouth daily before breakfast.        . furosemide (LASIX) 40 MG tablet TAKE 1 TABLET BY MOUTH TWICE DAILY  60 tablet  5  . metoprolol tartrate (LOPRESSOR) 25 MG tablet Take 1 tablet by mouth Twice daily.      . Omega-3 Fatty Acids (FISH OIL) 1000 MG CAPS Take by mouth 3 (three) times daily.        . potassium chloride SA (K-DUR,KLOR-CON) 20 MEQ tablet Take 20 mEq by mouth daily.        [name] UMECTA NAIL FILM 40 & 0.2 % KIT as directed.      . Tiotropium Bromide Monohydrate (SPIRIVA HANDIHALER IN) Inhale into the lungs daily.        [name] levothyroxine (SYNTHROID, LEVOTHROID) 50 MCG tablet Take 50 mcg by  mouth daily.        . calcium citrate-vitamin D (CITRACAL+D) 315-200 MG-UNIT per tablet Take 1 tablet by mouth daily.        . ciprofloxacin (CIPRO) 250 MG tablet as directed.      [name] HYDROMET 5-1.5 MG/5ML syrup as directed.         Review of Systems  Constitutional:   No  weight loss, night sweats,  Fevers, chills, fatigue, or  lassitude.  HEENT:   No headaches,  Difficulty swallowing,  Tooth/dental problems, or  Sore throat,                No sneezing, itching, ear ache, nasal congestion, post nasal drip,   CV:  No chest pain,  Orthopnea, PND, swelling in lower extremities, anasarca, dizziness, palpitations, syncope.   GI  No heartburn, indigestion, abdominal pain, nausea, vomiting, diarrhea, change in bowel habits, loss of appetite, bloody stools.   Resp:    No coughing up of blood.    No chest wall deformity  Skin: no rash or lesions.  GU: no dysuria, change in color of urine, no urgency or frequency.  No flank pain, no hematuria   MS:  No joint pain or swelling.  No decreased range of motion.   Psych:  No change in mood or affect. No depression or anxiety.            Objective:   Physical Exam  GEN: A/Ox3; pleasant , NAD, elderly  HEENT:  [place]/AT,  EACs-clear, TMs-wnl, NOSE-clear, THROAT-clear, no lesions, no postnasal drip or exudate noted.   NECK:  Supple w/ fair ROM; no JVD; normal carotid impulses w/o bruits; no thyromegaly or nodules palpated; no lymphadenopathy.  RESP  Coarse BS w/ no wheezing no accessory muscle use, no dullness to percussion  CARD:  RRR, no m/r/g  , no peripheral edema, pulses intact, no cyanosis or clubbing.  GI:   Soft & nt; nml bowel sounds; no organomegaly or masses detected.  Musco: Warm  bil, no deformities or joint swelling noted.   Neuro: alert, no focal deficits noted.    Skin: Warm, no lesions or rashes         Assessment & Plan:   COPD Stable copd Plan No change in inhaled or maintenance medications. Recheck ONO on  RA Return in   4 months    Updated Medication List Outpatient Encounter Prescriptions as of 08/14/2010  Medication Sig Dispense Refill  . ALPRAZolam (XANAX) 0.5 MG tablet Take 1 tablet by mouth as needed.      [name] aspirin 81 MG tablet Take 81 mg by mouth daily.        . Bisacodyl (DULCOLAX PO) Take by mouth as needed.        . diazepam (VALIUM) 5 MG tablet Take 5 mg by mouth every 6 (six) hours as needed.        [name] esomeprazole (NEXIUM) 40 MG capsule Take 40 mg by mouth daily before breakfast.        . furosemide (LASIX) 40 MG tablet TAKE 1 TABLET BY MOUTH TWICE DAILY  60 tablet  5  . metoprolol tartrate (LOPRESSOR) 25 MG tablet Take 1 tablet by mouth Twice daily.      . Omega-3 Fatty Acids (FISH OIL) 1000 MG CAPS Take by mouth 3 (three) times daily.        . potassium chloride SA (K-DUR,KLOR-CON) 20 MEQ tablet Take 20 mEq by mouth daily.        [name] tiotropium (SPIRIVA HANDIHALER) 18 MCG inhalation capsule Place 1 capsule (18 mcg total) into inhaler and inhale daily.  30 capsule  6  . UMECTA NAIL FILM 40 & 0.2 % KIT as directed.      [name] DISCONTD: Tiotropium Bromide Monohydrate (SPIRIVA HANDIHALER IN) Inhale into the lungs daily.        [name] levothyroxine (SYNTHROID, LEVOTHROID) 50 MCG tablet Take 50 mcg by mouth daily.        [name] DISCONTD: calcium citrate-vitamin D (CITRACAL+D) 315-200 MG-UNIT per tablet Take 1 tablet by mouth daily.        [name] DISCONTD: ciprofloxacin (CIPRO) 250 MG tablet as directed.      [name] DISCONTD: HYDROMET 5-1.5 MG/5ML syrup as directed.

## 2010-08-14 NOTE — Patient Instructions (Addendum)
No change in medications. Refills on Spiriva sent A sleep oximetry will be scheduled per [facility] Supply Return in          4 months

## 2010-08-15 ENCOUNTER — Encounter (HOSPITAL_COMMUNITY): Payer: Medicare Other

## 2010-08-17 ENCOUNTER — Encounter (HOSPITAL_COMMUNITY): Payer: Medicare Other

## 2010-08-22 ENCOUNTER — Encounter (HOSPITAL_COMMUNITY): Payer: Medicare Other

## 2010-08-24 ENCOUNTER — Encounter (HOSPITAL_COMMUNITY): Payer: Medicare Other

## 2010-08-29 ENCOUNTER — Encounter (HOSPITAL_COMMUNITY): Payer: Medicare Other

## 2010-08-30 ENCOUNTER — Telehealth: Payer: Self-pay | Admitting: Critical Care Medicine

## 2010-08-30 NOTE — Telephone Encounter (Signed)
ONO on RA normal,  pls let the pt know result,  No oxygen needed

## 2010-08-31 ENCOUNTER — Encounter (HOSPITAL_COMMUNITY): Payer: Medicare Other

## 2010-08-31 NOTE — Telephone Encounter (Signed)
Called, spoke with pt.  She is aware ONO on RA normal - no oxygen needed per PW.  She verbalized understanding of this.

## 2010-09-05 ENCOUNTER — Encounter (HOSPITAL_COMMUNITY): Payer: Medicare Other

## 2010-09-05 ENCOUNTER — Encounter: Payer: Self-pay | Admitting: Critical Care Medicine

## 2010-09-07 ENCOUNTER — Encounter (HOSPITAL_COMMUNITY): Payer: Medicare Other

## 2010-09-11 ENCOUNTER — Telehealth: Payer: Self-pay | Admitting: Critical Care Medicine

## 2010-09-11 NOTE — Telephone Encounter (Signed)
Pt aware she should check with the airlines for rules on o2 for carry on and then contact us once she knows what the requirements are for that airline, pt states she is going to get a packet from them at the end of the week and she will contact us again once she gets the info from the airlines, she thinks they may have a form for dr Waynetta Sandy to fill out but she is not sure--again pt will call back and leave new msg once she gets her packet from the airlines

## 2010-09-12 ENCOUNTER — Encounter (HOSPITAL_COMMUNITY): Payer: Medicare Other

## 2010-09-12 ENCOUNTER — Telehealth: Payer: Self-pay | Admitting: Critical Care Medicine

## 2010-09-12 DIAGNOSIS — J449 Chronic obstructive pulmonary disease, unspecified: Secondary | ICD-10-CM

## 2010-09-12 NOTE — Telephone Encounter (Signed)
This is completed and in my RN lookat pile

## 2010-09-12 NOTE — Telephone Encounter (Signed)
Forms given to Dr. Delford Field. Please advise. Thanks  Carver Fila, CMA

## 2010-09-13 ENCOUNTER — Telehealth: Payer: Self-pay | Admitting: Cardiology

## 2010-09-13 NOTE — Telephone Encounter (Signed)
Pt going on airplane trip, pt wants to know if the alarms will go off with the wires on her chest, pt wants to know if she needs clearance papers, please call pt back

## 2010-09-13 NOTE — Telephone Encounter (Signed)
Chart in box 

## 2010-09-13 NOTE — Telephone Encounter (Signed)
Called wanting to know if wires in her chest would cause alarms at airport. She has had a CABG and sternum is wired but will not set off alarms at airport. Reassured her.

## 2010-09-13 NOTE — Telephone Encounter (Signed)
Called, spoke with pt.  She is aware forms were completed by PW. She is requesting to pick these up -- forms placed at front for pick up -- pt aware.   Pt states she also needs an order sent to [facility] stating "pt is requesting a portable concentrator."  Dr. Delford Field, are you ok with this?  I do no mind placing it.

## 2010-09-13 NOTE — Telephone Encounter (Signed)
Patient calling back about an order for a portable concentrator.  [phone]

## 2010-09-13 NOTE — Telephone Encounter (Signed)
Yes , place the order for a portable concentrator

## 2010-09-14 ENCOUNTER — Telehealth: Payer: Self-pay | Admitting: Critical Care Medicine

## 2010-09-14 ENCOUNTER — Encounter (HOSPITAL_COMMUNITY): Payer: Medicare Other

## 2010-09-14 NOTE — Telephone Encounter (Signed)
Order was sent yesterday at 4:16pm to [facility]. [id number] on home number. ATCx1 cell number unable to leave a message. [name], CMA

## 2010-09-14 NOTE — Telephone Encounter (Signed)
Spoke with pt and notified that order was faxed and she verbalized understanding and stated nothing further needed.

## 2010-09-19 ENCOUNTER — Encounter (HOSPITAL_COMMUNITY): Payer: Medicare Other

## 2010-10-28 ENCOUNTER — Other Ambulatory Visit: Payer: Self-pay | Admitting: Cardiovascular Disease

## 2010-10-30 ENCOUNTER — Other Ambulatory Visit: Payer: Self-pay | Admitting: *Deleted

## 2010-10-30 ENCOUNTER — Other Ambulatory Visit: Payer: Self-pay | Admitting: Cardiology

## 2010-10-30 MED ORDER — METOPROLOL TARTRATE 25 MG PO TABS: 25.0000 mg | ORAL_TABLET | Freq: Two times a day (BID) | ORAL | Status: DC

## 2010-10-30 NOTE — Telephone Encounter (Signed)
Called requesting refill on Metoprolol. Was sent in earlier today.

## 2010-10-30 NOTE — Telephone Encounter (Signed)
Fax Received. Refill Completed. Penni Penado Chowoe (M.A)  

## 2010-10-30 NOTE — Telephone Encounter (Signed)
Pt wants refill motoprolol [facility] [address]

## 2010-11-02 LAB — POCT I-STAT, CHEM 8
BUN: 15
Calcium, Ion: 1.2
Creatinine, Ser: 1
Glucose, Bld: 92
TCO2: 30

## 2010-11-02 LAB — D-DIMER, QUANTITATIVE: D-Dimer, Quant: 0.33

## 2010-11-02 LAB — LIPID PANEL
HDL: 45
Triglycerides: 116

## 2010-11-02 LAB — CARDIAC PANEL(CRET KIN+CKTOT+MB+TROPI): CK, MB: 1

## 2010-11-02 LAB — CK TOTAL AND CKMB (NOT AT ARMC)
CK, MB: 0.9
Relative Index: INVALID
Total CK: 45

## 2010-11-02 LAB — TROPONIN I: Troponin I: 0.01

## 2010-11-02 LAB — POCT CARDIAC MARKERS: Operator id: 146091

## 2010-11-22 ENCOUNTER — Telehealth: Payer: Self-pay | Admitting: Critical Care Medicine

## 2010-11-22 NOTE — Telephone Encounter (Signed)
Pt states her sxs have improved slightly, her O2 levels are normal but she feels her breathing is more labored and she is using her oxygen more. She says her weakness is no worse and she will keep her appt to see TP on Mon., 10/22 but will seek emergency help if needed before then. She declined to schedule an earlier appt this week.

## 2010-11-27 ENCOUNTER — Encounter: Payer: Self-pay | Admitting: Adult Health

## 2010-11-27 ENCOUNTER — Ambulatory Visit (INDEPENDENT_AMBULATORY_CARE_PROVIDER_SITE_OTHER): Payer: Medicare Other | Admitting: Adult Health

## 2010-11-27 DIAGNOSIS — R079 Chest pain, unspecified: Secondary | ICD-10-CM

## 2010-11-27 DIAGNOSIS — I2510 Atherosclerotic heart disease of native coronary artery without angina pectoris: Secondary | ICD-10-CM

## 2010-11-27 MED ORDER — LEVOFLOXACIN 500 MG PO TABS: 500.0000 mg | ORAL_TABLET | Freq: Every day | ORAL | Status: DC

## 2010-11-27 NOTE — Assessment & Plan Note (Addendum)
Atypical chest pain ? eitology  EKG showed NSR with RBBB , no comparison EKG available  Will get her into see cards tomorrow, pt aware of Ov.  Advised if chest pain returns needs to go to ER/911

## 2010-11-27 NOTE — Patient Instructions (Signed)
Levaquin 500mg  daily for 10 days  Mucinex DM Twice daily  As needed  Cough/congestion  We are referring you to Dr. Swaziland to evaluate your chest pain  IF you develop chest pain again , please call 911 or go to ER.  follow up Dr. Delford Field  In 2-3 weeks and As needed   Please contact office for sooner follow up if symptoms do not improve or worsen or seek emergency care

## 2010-11-27 NOTE — Progress Notes (Signed)
Subjective:    Patient ID: Renee [name], female    DOB: 01/27/1935, 76 y.o.   MRN: [id number]  HPI  76 yo female with known hx of COPD   January 18, 2010 1:52 PM  The pt is now on spiriva daily and oxygen therapy.  The pt lives in a townhouse, now can walk without the oxygen.  Pt likes the spiriva as far as lungs are concerned. Pt got hoarse on spiriva but is taking too fast a breath on the inhaler   March 13, 2010 11:48 AM  Pt is improved. Pt notes some soreness in the throat. There is no cough. THere is no excess mucus. The spiriva causes hoarseness.    06/30/10 Acute OV  Pt presents for an acute office visit. Complains of chest congestion, prod cough with green mucus, hoarseness, increased SOB, wheezing x1week. Was seen by PCP and  given cipro 250mg  x14days (has 4 days left) and hydrmet . SHe is feeling some better but cugh is not completely gone. Mucus is clearing. No chest pain or hemoptysis. Cough syrup is helping some.   08/14/2010 Was seen end of may due to sinusitis.  Still with thick yellow mucus.  Dyspnea is better.  Uses oxygen at night,  Prn during the day. No real chest pain.    11/27/2010 Acute OV  Pt complains of 2 weeks of sinus congestion, drainage, cough and congestion with brown mucus.  Also had a stomach virus with nausea and diarrhea 1 week ago, that has now resolved.  Woke up with mid chest pain  2 weeks ago in sleep that went away without treatment. Did not seek medical attention. Since then has had left sided rib pain on/off. No exertional chest pain or dyspnea.  Undergoing steroid injection in back by Dr. Ethelene Hal.  No otc used. Has had more DOE requiring her to wear her O2.    Past Medical History  Diagnosis Date  . GERD (gastroesophageal reflux disease)   . IBS (irritable bowel syndrome)   . Bundle branch block, right   . Coronary artery disease   . Emphysema/COPD   . Fatigue   . SOB (shortness of breath)   . Bruises easily   . Joint pain   .  Weakness   . Anxiety   . Pain on voiding   . AF (atrial fibrillation)   . Fracture of toe of left foot   . HTN (hypertension)   . CHF (congestive heart failure)   . Dysphagia   . Colon polyps      Family History  Problem Relation Age of Onset  . Heart disease Mother   . Transient ischemic attack Mother   . Heart attack Father   . Hypertension Father   . Diabetes Father   . Breast cancer Sister   . Other Sister     brain tumor  . Colon polyps Brother   . Colon cancer Neg Hx      History   Social History  . Marital Status: Married    Spouse Name: N/A    Number of Children: 1  . Years of Education: N/A   Occupational History  . nurse     retired   Social History Main Topics  . Smoking status: Former Smoker -- 1.0 packs/day for 50 years    Types: Cigarettes    Quit date: 09/21/2009  . Smokeless tobacco: Never Used  . Alcohol Use: No  . Drug Use: No  . Sexually Active:  Not on file   Other Topics Concern  . Not on file   Social History Narrative   Daily caffeine use: Dr Reino Kent and 2 cups coffee per day     Allergies  Allergen Reactions  . Morphine     REACTION: double vision  . Penicillins     REACTION: swelling/rash  . Phenazopyridine Hcl     *piridium* REACTION: face red  . Pneumococcal Vaccine     REACTION: rash and cough  . Prednisone     REACTION: rash  . Sulfonamide Derivatives     REACTION: rash  . Tylenol (Acetaminophen)     Tylenol #3 -- rash and swelling     Outpatient Prescriptions Prior to Visit  Medication Sig Dispense Refill  . ALPRAZolam (XANAX) 0.5 MG tablet Take 1 tablet by mouth as needed.      [name] aspirin 81 MG tablet Take 81 mg by mouth daily.        . Bisacodyl (DULCOLAX PO) Take by mouth as needed.        [name] esomeprazole (NEXIUM) 40 MG capsule Take 40 mg by mouth daily before breakfast.        . furosemide (LASIX) 40 MG tablet TAKE 1 TABLET BY MOUTH TWICE DAILY  60 tablet  5  . levothyroxine (SYNTHROID, LEVOTHROID) 50 MCG  tablet Take 50 mcg by mouth daily.        . metoprolol tartrate (LOPRESSOR) 25 MG tablet Take 1 tablet (25 mg total) by mouth 2 (two) times daily.  60 tablet  5  . potassium chloride SA (K-DUR,KLOR-CON) 20 MEQ tablet Take 20 mEq by mouth daily.        [name] tiotropium (SPIRIVA HANDIHALER) 18 MCG inhalation capsule Place 1 capsule (18 mcg total) into inhaler and inhale daily.  30 capsule  6  . diazepam (VALIUM) 5 MG tablet Take 5 mg by mouth every 6 (six) hours as needed.        . Omega-3 Fatty Acids (FISH OIL) 1000 MG CAPS Take by mouth 3 (three) times daily.        [name] UMECTA NAIL FILM 40 & 0.2 % KIT as directed.         Review of Systems  Constitutional:   No  weight loss, night sweats,  Fevers, chills, ++ fatigue, or  lassitude.  HEENT:   No headaches,  Difficulty swallowing,  Tooth/dental problems, or  Sore throat,                No sneezing, itching, ear ache,  ++nasal congestion, post nasal drip,   CV:   Orthopnea, PND, swelling in lower extremities, anasarca, dizziness, palpitations, syncope.   GI  No heartburn, indigestion, abdominal pain, nausea, vomiting, diarrhea, change in bowel habits, loss of appetite, bloody stools.   Resp:    No coughing up of blood.    No chest wall deformity  Skin: no rash or lesions.  GU: no dysuria, change in color of urine, no urgency or frequency.  No flank pain, no hematuria   MS:  No joint pain or swelling.  No decreased range of motion.   Psych:  No change in mood or affect. No depression or anxiety.            Objective:   Physical Exam  GEN: A/Ox3; pleasant , NAD, elderly  HEENT:  /AT,  EACs-clear, TMs-wnl, NOSE-clear drainage, max tenderness THROAT-clear, no lesions, no postnasal drip or exudate noted.   NECK:  Supple w/  fair ROM; no JVD; normal carotid impulses w/o bruits; no thyromegaly or nodules palpated; no lymphadenopathy.  RESP  Coarse BS w/ no wheezing no accessory muscle use, no dullness to percussion  CARD:  RRR, no  m/r/g  , no peripheral edema, pulses intact, no cyanosis or clubbing.  GI:   Soft & nt; nml bowel sounds; no organomegaly or masses detected.  Musco: Warm bil, no deformities or joint swelling noted.   Neuro: alert, no focal deficits noted.    Skin: Warm, no lesions or rashes         Assessment & Plan:  COPD Exacerbation with associated Sinusitis   Plan;  Levaquin 500mg  daily for 10 days  Mucinex DM Twice daily  As needed  Cough/congestion  We are referring you to Dr. Swaziland to evaluate your chest pain  IF you develop chest pain again , please call 911 or go to ER.  follow up Dr. Delford Field  In 2-3 weeks and As needed   Please contact office for sooner follow up if symptoms do not improve or worsen or seek emergency care      Coronary artery disease Atypical chest pain ? eitology  EKG showed  Will get her into see cards this week Advised if chest pain returns needs to go to ER/911        Updated Medication List Outpatient Encounter Prescriptions as of 11/27/2010  Medication Sig Dispense Refill  . ALPRAZolam (XANAX) 0.5 MG tablet Take 1 tablet by mouth as needed.      [name] aspirin 81 MG tablet Take 81 mg by mouth daily.        . Bisacodyl (DULCOLAX PO) Take by mouth as needed.        [name] esomeprazole (NEXIUM) 40 MG capsule Take 40 mg by mouth daily before breakfast.        . furosemide (LASIX) 40 MG tablet TAKE 1 TABLET BY MOUTH TWICE DAILY  60 tablet  5  . levothyroxine (SYNTHROID, LEVOTHROID) 50 MCG tablet Take 50 mcg by mouth daily.        . metoprolol tartrate (LOPRESSOR) 25 MG tablet Take 1 tablet (25 mg total) by mouth 2 (two) times daily.  60 tablet  5  . potassium chloride SA (K-DUR,KLOR-CON) 20 MEQ tablet Take 20 mEq by mouth daily.        [name] tiotropium (SPIRIVA HANDIHALER) 18 MCG inhalation capsule Place 1 capsule (18 mcg total) into inhaler and inhale daily.  30 capsule  6  . traMADol (ULTRAM) 50 MG tablet Take 1 tablet by mouth Every 4 hours as needed.      .  diazepam (VALIUM) 5 MG tablet Take 5 mg by mouth every 6 (six) hours as needed.        . Omega-3 Fatty Acids (FISH OIL) 1000 MG CAPS Take by mouth 3 (three) times daily.        [name] UMECTA NAIL FILM 40 & 0.2 % KIT as directed.

## 2010-11-27 NOTE — Assessment & Plan Note (Signed)
Exacerbation with associated Sinusitis   Plan;  Levaquin 500mg  daily for 10 days  Mucinex DM Twice daily  As needed  Cough/congestion  We are referring you to Dr. Swaziland to evaluate your chest pain  IF you develop chest pain again , please call 911 or go to ER.  follow up Dr. Delford Field  In 2-3 weeks and As needed   Please contact office for sooner follow up if symptoms do not improve or worsen or seek emergency care

## 2010-11-28 ENCOUNTER — Ambulatory Visit (INDEPENDENT_AMBULATORY_CARE_PROVIDER_SITE_OTHER): Payer: Medicare Other | Admitting: Nurse Practitioner

## 2010-11-28 ENCOUNTER — Ambulatory Visit
Admission: RE | Admit: 2010-11-28 | Discharge: 2010-11-28 | Disposition: A | Discharge status: Home / Self Care | Payer: Medicare Other | Source: Ambulatory Visit | Attending: Nurse Practitioner | Admitting: Nurse Practitioner

## 2010-11-28 ENCOUNTER — Encounter: Payer: Self-pay | Admitting: Nurse Practitioner

## 2010-11-28 ENCOUNTER — Telehealth: Payer: Self-pay | Admitting: Cardiology

## 2010-11-28 DIAGNOSIS — I2510 Atherosclerotic heart disease of native coronary artery without angina pectoris: Secondary | ICD-10-CM

## 2010-11-28 DIAGNOSIS — R079 Chest pain, unspecified: Secondary | ICD-10-CM

## 2010-11-28 DIAGNOSIS — R0989 Other specified symptoms and signs involving the circulatory and respiratory systems: Secondary | ICD-10-CM

## 2010-11-28 DIAGNOSIS — R0609 Other forms of dyspnea: Secondary | ICD-10-CM

## 2010-11-28 DIAGNOSIS — R0600 Dyspnea, unspecified: Secondary | ICD-10-CM

## 2010-11-28 DIAGNOSIS — R0602 Shortness of breath: Secondary | ICD-10-CM

## 2010-11-28 LAB — BASIC METABOLIC PANEL
BUN: 16 mg/dL (ref 6–23)
CO2: 29 mEq/L (ref 19–32)
Calcium: 9.2 mg/dL (ref 8.4–10.5)
Chloride: 97 mEq/L (ref 96–112)
Creatinine, Ser: 1.2 mg/dL (ref 0.4–1.2)
GFR: 48.52 mL/min — ABNORMAL LOW (ref >60.00)
Glucose, Bld: 90 mg/dL (ref 70–99)
Potassium: 3.2 mEq/L — ABNORMAL LOW (ref 3.5–5.1)
Sodium: 138 mEq/L (ref 135–145)

## 2010-11-28 LAB — BRAIN NATRIURETIC PEPTIDE: Pro B Natriuretic peptide (BNP): 81 pg/mL (ref 0.0–100.0)

## 2010-11-28 NOTE — Progress Notes (Signed)
Renee [name] Date of Birth: 1935/01/09 Medical Record #[id number]  History of Present Illness: Renee [name] is seen today for a work in visit. She is seen for Dr. Swaziland. She says she is just not doing well. She has gotten more short of breath. She is 14 months out from her CABG and continues to have chest pain. She is back on oxygen but says she "just can't breath anymore". No real cough. Has had some recent sinusitis/bronchitis and is on antibiotics. She was at the pulmonologist yesterday and complained of recurrent chest pain. She says she has been having spells of chest pain that have radiated to the jaw. She has had some component of incisional pain ever since her bypass. She has actually had injections to the sternal wound to no avail. She is frustrated and wants to know what is wrong.   Current Outpatient Prescriptions on File Prior to Visit  Medication Sig Dispense Refill  . ALPRAZolam (XANAX) 0.5 MG tablet Take 1 tablet by mouth as needed.      . Bisacodyl (DULCOLAX PO) Take by mouth as needed.        [name] esomeprazole (NEXIUM) 40 MG capsule Take 40 mg by mouth daily before breakfast.        . furosemide (LASIX) 40 MG tablet TAKE 1 TABLET BY MOUTH TWICE DAILY  60 tablet  5  . levofloxacin (LEVAQUIN) 500 MG tablet Take 1 tablet (500 mg total) by mouth daily.  10 tablet  0  . levothyroxine (SYNTHROID, LEVOTHROID) 50 MCG tablet Take 50 mcg by mouth daily.        . metoprolol tartrate (LOPRESSOR) 25 MG tablet Take 1 tablet (25 mg total) by mouth 2 (two) times daily.  60 tablet  5  . Omega-3 Fatty Acids (FISH OIL) 1000 MG CAPS Take by mouth 3 (three) times daily.        . OXYGEN-HELIUM IN Inhale 3 L into the lungs as needed.        . potassium chloride SA (K-DUR,KLOR-CON) 20 MEQ tablet Take 20 mEq by mouth daily.        [name] tiotropium (SPIRIVA HANDIHALER) 18 MCG inhalation capsule Place 1 capsule (18 mcg total) into inhaler and inhale daily.  30 capsule  6  . traMADol (ULTRAM) 50 MG tablet Take 1  tablet by mouth Every 4 hours as needed.      [name] UMECTA NAIL FILM 40 & 0.2 % KIT as directed.        Allergies  Allergen Reactions  . Morphine     REACTION: double vision  . Penicillins     REACTION: swelling/rash  . Phenazopyridine Hcl     *piridium* REACTION: face red  . Pneumococcal Vaccine     REACTION: rash and cough  . Prednisone     REACTION: rash  . Sulfonamide Derivatives     REACTION: rash  . Tylenol (Acetaminophen)     Tylenol #3 -- rash and swelling    Past Medical History  Diagnosis Date  . GERD (gastroesophageal reflux disease)   . IBS (irritable bowel syndrome)   . Bundle branch block, right   . Coronary artery disease     prior CABG  . Emphysema/COPD   . Fatigue   . SOB (shortness of breath)   . Bruises easily   . Joint pain   . Weakness   . Anxiety   . Pain on voiding   . AF (atrial fibrillation)   . Fracture of  toe of left foot   . HTN (hypertension)   . CHF (congestive heart failure)   . Dysphagia   . Colon polyps     Past Surgical History  Procedure Date  . Coronary artery bypass graft   . Total hip arthroplasty 11/01/2008    right  . Appendectomy 1995  . Cholecystectomy 2008    LARYNX  . Tonsillectomy and adenoidectomy   . Total abdominal hysterectomy 1986    History  Smoking status  . Former Smoker -- 1.0 packs/day for 50 years  . Types: Cigarettes  . Quit date: 09/21/2009  Smokeless tobacco  . Never Used    History  Alcohol Use No    Family History  Problem Relation Age of Onset  . Heart disease Mother   . Transient ischemic attack Mother   . Heart attack Father   . Hypertension Father   . Diabetes Father   . Breast cancer Sister   . Other Sister     brain tumor  . Colon polyps Brother   . Colon cancer Neg Hx     Review of Systems: The review of systems is positive for shortness of breath, recurrent chest pain, fatigue, weakness and back pain. Now back on oxygen. No swelling reported. Says she has been losing  weight. Has had recent GI bug that is now resolved.  All other systems were reviewed and are negative.  Physical Exam: BP 102/60  Pulse 84  Ht 5\' 6"  (1.676 m)  Wt 154 lb (69.854 kg)  BMI 24.86 kg/m2 Patient is a little anxious but in no acute distress. Oxygen is in place. Skin is warm and dry. Color is normal.  HEENT is unremarkable. Normocephalic/atraumatic. PERRL. Sclera are nonicteric. Neck is supple. No masses. No JVD. Lungs are clear. Cardiac exam shows a regular rate and rhythm. Chest wall remains very sensitive to touch.  Abdomen is soft. Extremities are without edema. Gait and ROM are intact. No gross neurologic deficits noted.  LABORATORY DATA: PENDING  Assessment / Plan:

## 2010-11-28 NOTE — Assessment & Plan Note (Signed)
She is complaining of recurrent chest pain. Seems somewhat atypical but did have a spell that radiated to her arm/neck over one week ago. She is 14 months out from her CABG. EKG was done yesterday and showed her RBBB with no acute changes. We will send her for a myoview. Labs will be checked today. She has an appointment to see Dr. Swaziland early in November. Patient is agreeable to this plan and will call if any problems develop in the interim.

## 2010-11-28 NOTE — Assessment & Plan Note (Signed)
She has had a normal EF documented. We will check a BNP level today. She is to continue with her oxygen. I suspect this is more pulmonary related.

## 2010-11-28 NOTE — Telephone Encounter (Signed)
Pt returning call to our office. Please call back.  

## 2010-11-28 NOTE — Patient Instructions (Addendum)
We are going to check some labs today and send you for a CXR at [facility]. Go to the [facility] 1st Floor  We are going to arrange for a stress test in the next one week.  Wear your oxygen.  Keep your appointment with Dr. Swaziland on November 6th.

## 2010-11-29 ENCOUNTER — Telehealth: Payer: Self-pay | Admitting: *Deleted

## 2010-11-29 MED ORDER — POTASSIUM CHLORIDE CRYS ER 20 MEQ PO TBCR: 20.0000 meq | EXTENDED_RELEASE_TABLET | Freq: Two times a day (BID) | ORAL | Status: DC

## 2010-11-29 NOTE — Telephone Encounter (Signed)
Message copied by Lorayne Bender on Wed Nov 29, 2010  2:25 PM ------      Message from: Rosalio Macadamia      Created: Tue Nov 28, 2010 11:55 AM       Please report and send copy to pulmonary. Will see what the labs and myoview show.

## 2010-11-29 NOTE — Telephone Encounter (Signed)
Notified of chest xray results. Will send to Dr. Delford Field and Dr. Clelia Croft

## 2010-11-29 NOTE — Telephone Encounter (Signed)
Message copied by Antony Odea on Wed Nov 29, 2010  1:43 PM ------      Message from: Renee [name]      Created: Tue Nov 28, 2010  3:50 PM       Please report. BNP is normal. Shortness of breath is more pulmonary. Potassium is low. Needs to increase Potassium to BID. Recheck BMET at return visit.

## 2010-11-29 NOTE — Telephone Encounter (Signed)
Pt told to increase k+ to twice a day, keep stress test app/ fu app. Pt verbalized understanding. Alfonso Ramus RN

## 2010-12-06 ENCOUNTER — Encounter: Payer: Self-pay | Admitting: *Deleted

## 2010-12-06 ENCOUNTER — Other Ambulatory Visit: Payer: Self-pay | Admitting: *Deleted

## 2010-12-06 MED ORDER — POTASSIUM CHLORIDE CRYS ER 20 MEQ PO TBCR: 20.0000 meq | EXTENDED_RELEASE_TABLET | Freq: Every day | ORAL | Status: DC

## 2010-12-07 ENCOUNTER — Ambulatory Visit (HOSPITAL_COMMUNITY): Payer: Medicare Other | Attending: Nurse Practitioner | Admitting: Radiology

## 2010-12-07 DIAGNOSIS — R0609 Other forms of dyspnea: Secondary | ICD-10-CM | POA: Insufficient documentation

## 2010-12-07 DIAGNOSIS — I25810 Atherosclerosis of coronary artery bypass graft(s) without angina pectoris: Secondary | ICD-10-CM

## 2010-12-07 DIAGNOSIS — R0600 Dyspnea, unspecified: Secondary | ICD-10-CM

## 2010-12-07 DIAGNOSIS — I4510 Unspecified right bundle-branch block: Secondary | ICD-10-CM

## 2010-12-07 DIAGNOSIS — R079 Chest pain, unspecified: Secondary | ICD-10-CM | POA: Insufficient documentation

## 2010-12-07 DIAGNOSIS — R0989 Other specified symptoms and signs involving the circulatory and respiratory systems: Secondary | ICD-10-CM | POA: Insufficient documentation

## 2010-12-07 MED ORDER — TECHNETIUM TC 99M TETROFOSMIN IV KIT
11.0000 | PACK | Freq: Once | INTRAVENOUS | Status: AC | PRN
  Administered 2010-12-07: 11 via INTRAVENOUS

## 2010-12-07 MED ORDER — AMINOPHYLLINE 25 MG/ML IV SOLN
75.0000 mg | Freq: Once | INTRAVENOUS | Status: AC
  Administered 2010-12-07: 75 mg via INTRAVENOUS

## 2010-12-07 MED ORDER — REGADENOSON 0.4 MG/5ML IV SOLN
0.4000 mg | Freq: Once | INTRAVENOUS | Status: AC
  Administered 2010-12-07: 0.4 mg via INTRAVENOUS

## 2010-12-07 MED ORDER — TECHNETIUM TC 99M TETROFOSMIN IV KIT
33.0000 | PACK | Freq: Once | INTRAVENOUS | Status: AC | PRN
  Administered 2010-12-07: 33 via INTRAVENOUS

## 2010-12-07 NOTE — Progress Notes (Signed)
[facility] SITE 3 NUCLEAR MED [address] [place] [address] [phone]  Cardiology Nuclear Med Study  Renee [name] is a 76 y.o. female [id number] 1934-09-17   Nuclear Med Background Indication for Stress Test:  Evaluation for Ischemia and Graft Patency History:  COPD, Emphysema and '06 OZH:[id number]-IONGEX ischemia, EF=73%; '11 Echo:EF=55-60%;  7/11 MI>Cath>8/11 CABG; H/O CHF Cardiac Risk Factors: Family History - CAD, History of Smoking, Hypertension, Lipids and RBBB  Symptoms:  Chest Pain (last date of chest discomfort was at check in this am, 5/10; and some now, 3-4/10), DOE, Fatigue and SOB   Nuclear Pre-Procedure Caffeine/Decaff Intake:  None NPO After: 8:30pm   Lungs:  Clear.  O2 SAT 96% on RA. IV 0.9% NS with Angio Cath:  20g  IV Site: R Antecubital  IV Started by:  Stanton Kidney, EMT-P  Chest Size (in):  36 Cup Size: C  Height: 5\' 6"  (1.676 m)  Weight:  153 lb (69.4 kg)  BMI:  Body mass index is 24.69 kg/(m^2). Tech Comments:  Metoprolol held this am, all medications held per patient.    Nuclear Med Study 1 or 2 day study: 1 day  Stress Test Type:  Lexiscan  Reading MD: Olga Millers, MD  Order Authorizing Provider:  Peter Swaziland, MD  Resting Radionuclide: Technetium 52m Tetrofosmin  Resting Radionuclide Dose: 11.0 mCi   Stress Radionuclide:  Technetium 6m Tetrofosmin  Stress Radionuclide Dose: 33.0 mCi           Stress Protocol Rest HR: 78 Stress HR: 117  Rest BP: 114/82 Stress BP: 128/70  Exercise Time (min): n/a METS: n/a   Predicted Max HR: 146 bpm % Max HR: 80.14 bpm Rate Pressure Product: 52841   Dose of Adenosine (mg):  n/a Dose of Lexiscan: 0.4 mg  Dose of Atropine (mg): n/a Dose of Dobutamine: n/a mcg/kg/min (at max HR)  Stress Test Technologist: Smiley Houseman, CMA-N  Nuclear Technologist:  Domenic Polite, CNMT     Rest Procedure:  Myocardial perfusion imaging was performed at rest 45 minutes following the intravenous  administration of Technetium 59m Tetrofosmin.  Rest ECG: RBBB  Stress Procedure:  The patient received IV Lexiscan 0.4 mg over 15-seconds.  Technetium 7m Tetrofosmin injected at 30-seconds.  There were nonspecific T-wave changes with Lexiscan.  She c/o chest and neck tightness that would not resolve.  She was given Aminophylline 75 mg with immediate relief.  Quantitative spect images were obtained after a 45 minute delay.  Stress ECG: Significant ST abnormalities consistent with ischemia.  QPS Raw Data Images:  There is interference from nuclear activity from structures below the diaphragm. This does not affect the ability to read the study. Stress Images:  Normal homogeneous uptake in all areas of the myocardium. Rest Images:  Normal homogeneous uptake in all areas of the myocardium. Subtraction (SDS):  No evidence of ischemia. Transient Ischemic Dilatation (Normal <1.22):  1.09 Lung/Heart Ratio (Normal <0.45):  0.37  Quantitative Gated Spect Images QGS EDV:  51 ml QGS ESV:  17 ml QGS cine images:  NL LV Function; NL Wall Motion QGS EF: 67%  Impression Exercise Capacity:  Lexiscan with no exercise. BP Response:  Normal blood pressure response. Clinical Symptoms:  There is chest pain. ECG Impression:  No significant ST segment change suggestive of ischemia. Comparison with Prior Nuclear Study: No images to compare  Overall Impression:  Normal stress nuclear study with no ischemia or infarction.   Olga Millers

## 2010-12-08 ENCOUNTER — Telehealth: Payer: Self-pay | Admitting: *Deleted

## 2010-12-08 NOTE — Telephone Encounter (Signed)
Notified of stress test. Will send to Dr. Clelia Croft

## 2010-12-11 ENCOUNTER — Telehealth: Payer: Self-pay | Admitting: *Deleted

## 2010-12-11 NOTE — Telephone Encounter (Signed)
Notified of stress test results. Will send copy to Dr. Clelia Croft. States she can app for Tues because she felt like nothing else needed to be done.

## 2010-12-12 ENCOUNTER — Ambulatory Visit: Payer: Medicare Other | Admitting: Cardiology

## 2010-12-13 ENCOUNTER — Ambulatory Visit: Payer: Medicare Other | Admitting: Internal Medicine

## 2010-12-24 ENCOUNTER — Other Ambulatory Visit: Payer: Self-pay

## 2010-12-24 ENCOUNTER — Emergency Department (HOSPITAL_COMMUNITY): Payer: Medicare Other

## 2010-12-24 ENCOUNTER — Emergency Department (HOSPITAL_COMMUNITY)
Admission: EM | Admit: 2010-12-24 | Discharge: 2010-12-24 | Disposition: A | Discharge status: Home / Self Care | Payer: Medicare Other | Attending: Emergency Medicine | Admitting: Emergency Medicine

## 2010-12-24 DIAGNOSIS — E7800 Pure hypercholesterolemia, unspecified: Secondary | ICD-10-CM | POA: Insufficient documentation

## 2010-12-24 DIAGNOSIS — R079 Chest pain, unspecified: Secondary | ICD-10-CM | POA: Insufficient documentation

## 2010-12-24 DIAGNOSIS — E876 Hypokalemia: Secondary | ICD-10-CM | POA: Insufficient documentation

## 2010-12-24 DIAGNOSIS — Z79899 Other long term (current) drug therapy: Secondary | ICD-10-CM | POA: Insufficient documentation

## 2010-12-24 DIAGNOSIS — R0602 Shortness of breath: Secondary | ICD-10-CM | POA: Insufficient documentation

## 2010-12-24 DIAGNOSIS — I10 Essential (primary) hypertension: Secondary | ICD-10-CM | POA: Insufficient documentation

## 2010-12-24 HISTORY — DX: Essential (primary) hypertension: I10

## 2010-12-24 LAB — POCT I-STAT, CHEM 8
BUN: 17 mg/dL (ref 6–23)
Calcium, Ion: 1.1 mmol/L — ABNORMAL LOW (ref 1.12–1.32)
HCT: 38 % (ref 36.0–46.0)
TCO2: 29 mmol/L (ref 0–100)

## 2010-12-24 LAB — POCT I-STAT TROPONIN I: Troponin i, poc: 0 ng/mL (ref 0.00–0.08)

## 2010-12-24 LAB — PRO B NATRIURETIC PEPTIDE: Pro B Natriuretic peptide (BNP): 5 pg/mL (ref 0–125)

## 2010-12-24 MED ORDER — POTASSIUM CHLORIDE CRYS ER 20 MEQ PO TBCR
40.0000 meq | EXTENDED_RELEASE_TABLET | Freq: Once | ORAL | Status: AC
  Administered 2010-12-24: 40 meq via ORAL

## 2010-12-24 NOTE — ED Notes (Signed)
D/c instructions reviewed w/ pt and family - pt and family deny any further questions or concerns at present.\ 

## 2010-12-24 NOTE — ED Provider Notes (Signed)
History     CSN: [id number] Arrival date & time: 12/24/2010  2:46 PM   First MD Initiated Contact with Patient 12/24/10 1554      Chief Complaint  Patient presents with  . Chest Pain    pt in from home with acute onset of chest pain states onset 1 hour ago denies n/v some sob pain radiates to the neck and teeth     (Consider location/radiation/quality/duration/timing/severity/associated sxs/prior treatment) Patient is a 76 y.o. female presenting with chest pain. The history is provided by the patient and the spouse.  Chest Pain The chest pain began 1 - 2 hours ago. Duration of episode(s) is 1 hour. Chest pain occurs constantly. The chest pain is resolved. Associated with: No known provocation. The severity of the pain is moderate. The quality of the pain is described as aching. The pain radiates to the left jaw, left neck, right neck and right jaw. Exacerbated by: Nothing. Pertinent negatives for primary symptoms include no fever, no fatigue, no syncope, no cough, no wheezing, no palpitations and no nausea. Primary symptoms comment: She has had progressive shortness of breath for one month. She has resumed using her oxygen By nasal cannula.  Pertinent negatives for associated symptoms include no claudication, no diaphoresis, no lower extremity edema, no numbness, no paroxysmal nocturnal dyspnea and no weakness. She tried nitroglycerin (The nitroglycerin improved the chest pain and 3-4 minutes and the jaw pain and one hour) for the symptoms.  Her past medical history is significant for MI.  Pertinent negatives for past medical history include no aortic aneurysm, no mitral valve prolapse and no pacemaker.  Procedure history is positive for cardiac catheterization and stress thallium (Stress test done one week ago, was negative; it was done for a similar type of chest pain).    patient had a similar chest pain 3 weeks ago, while sleeping. She subsequently saw her pulmonologist, who referred her  to her cardiologist, who ordered a stress test. Stress test was negative one week ago. The patient has chronic chest wall pain after her CABG one year ago. She has had injections in the area at the upper incision to help decrease the discomfort. The injections have not helped. She does not take nitroglycerin regularly. The last time she used it was one year ago.  Past Medical History  Diagnosis Date  . Hypertension   . Hypercholesteremia     Past Surgical History  Procedure Date  . Cardiac surgery     No family history on file.  History  Substance Use Topics  . Smoking status: Former Games developer  . Smokeless tobacco: Not on file  . Alcohol Use: No    OB History    Grav Para Term Preterm Abortions TAB SAB Ect Mult Living                  Review of Systems  Constitutional: Negative for fever, diaphoresis and fatigue.  Respiratory: Negative for cough and wheezing.   Cardiovascular: Positive for chest pain. Negative for palpitations, claudication and syncope.  Gastrointestinal: Negative for nausea.  Neurological: Negative for weakness and numbness.  All other systems reviewed and are negative.    Allergies  Pneumococcal vaccines; Prednisone; Penicillins; and Sulfa antibiotics  Home Medications   Current Outpatient Rx  Name Route Sig Dispense Refill  . ALPRAZOLAM 0.5 MG PO TABS Oral Take 0.5 mg by mouth daily as needed. Anxiety     . ASPIRIN EC 81 MG PO TBEC Oral Take 81 mg  by mouth daily.      [name] CALCIUM CITRATE-VITAMIN D 315-200 MG-UNIT PO TABS Oral Take 1 tablet by mouth 2 (two) times daily.      [name] ESOMEPRAZOLE MAGNESIUM 40 MG PO CPDR Oral Take 40 mg by mouth daily before breakfast.      . OMEGA-3 FATTY ACIDS 1000 MG PO CAPS Oral Take 1 g by mouth 3 (three) times daily.      . FUROSEMIDE 40 MG PO TABS Oral Take 40 mg by mouth 2 (two) times daily.      [name] LEVOTHYROXINE SODIUM 50 MCG PO TABS Oral Take 50 mcg by mouth daily.      [name] METOPROLOL TARTRATE 25 MG PO TABS Oral Take  25 mg by mouth 2 (two) times daily.      [name] NITROGLYCERIN 0.6 MG SL SUBL Sublingual Place 0.6 mg under the tongue every 5 (five) minutes as needed. Chest Pain     . SYSTANE OP Ophthalmic Apply 1 drop to eye daily.      [name] POTASSIUM CHLORIDE CRYS CR 20 MEQ PO TBCR Oral Take 20 mEq by mouth 2 (two) times daily.      [name] TIOTROPIUM BROMIDE MONOHYDRATE 18 MCG IN CAPS Inhalation Place 18 mcg into inhaler and inhale daily.      . TRAMADOL HCL 50 MG PO TABS Oral Take 50 mg by mouth every 6 (six) hours as needed. Pain     . VITAMIN D (CHOLECALCIFEROL) PO Oral Take 1 tablet by mouth daily.        BP 145/94  Pulse 86  Temp(Src) 97.6 F (36.4 C) (Oral)  Resp 18  SpO2 99%  Physical Exam  Nursing note and vitals reviewed. Constitutional: She is oriented to person, place, and time. She appears well-developed and well-nourished.  HENT:  Head: Normocephalic.  Right Ear: External ear normal.  Left Ear: External ear normal.  Eyes: Conjunctivae and EOM are normal. Pupils are equal, round, and reactive to light.  Neck: Normal range of motion. Neck supple. No tracheal deviation present. No thyromegaly present.  Cardiovascular: Normal rate and regular rhythm.   Pulmonary/Chest: Effort normal and breath sounds normal.       There is mild, diffuse chest wall tenderness  Abdominal: Soft. Bowel sounds are normal.  Musculoskeletal: Normal range of motion.  Neurological: She is alert and oriented to person, place, and time. She exhibits normal muscle tone. Coordination normal.  Skin: Skin is warm and dry.  Psychiatric: She has a normal mood and affect. Her behavior is normal. Judgment and thought content normal.    ED Course  Procedures (including critical care time)  Date: 12/24/2010  Rate:   Rhythm: normal sinus rhythm  QRS Axis: normal  Intervals: normal  ST/T Wave abnormalities: normal  Conduction Disutrbances:right bundle branch block  Narrative Interpretation: LVH, left atrial enlargement  Old E.  There's been no recurrence of chest painKG Reviewed: none available  Labs Reviewed  POCT I-STAT, CHEM 8 - Abnormal; Notable for the following:    Potassium 3.2 (*)    Calcium, Ion 1.10 (*)    All other components within normal limits  PRO B NATRIURETIC PEPTIDE  POCT I-STAT TROPONIN I  POCT CARDIAC MARKERS  I-STAT, CHEM 8  I-STAT TROPONIN I   Chest Portable 1 View  12/24/2010  *RADIOLOGY REPORT*  Clinical Data: Chest pain, history of heart surgery  PORTABLE CHEST - 1 VIEW  Comparison: None.  Findings: Lungs are clear. No pleural effusion or pneumothorax.  Cardiomediastinal silhouette is within normal limits. Postsurgical changes related to prior CABG.  IMPRESSION: No evidence of acute cardiopulmonary disease.  Original Report Authenticated By: Charline Bills, M.D.   8:22 PMthere has been no recurrence of chest pain while in the emergency department . She was treated with potassium   1. Chest pain   2. Hypokalemia    I discussed the case with the on-call cardiologist, for Dr. Swaziland. We both feel that the patient can followup with him in the office this week for further investigation.    MDM   nonspecific chest pain, unlikely to be cardiac since she had a very recent cardiac stress test that was normal. She is incidental hypokalemia can be treated as usual with increasing the amount of potassium in her diet.        Flint Melter, MD 12/24/10 [phone]

## 2010-12-25 ENCOUNTER — Encounter: Payer: Self-pay | Admitting: Nurse Practitioner

## 2010-12-26 ENCOUNTER — Encounter: Payer: Self-pay | Admitting: Nurse Practitioner

## 2010-12-26 ENCOUNTER — Ambulatory Visit (INDEPENDENT_AMBULATORY_CARE_PROVIDER_SITE_OTHER): Payer: Medicare Other | Admitting: Nurse Practitioner

## 2010-12-26 VITALS — BP 104/70 | HR 68 | Ht 66.0 in | Wt 154.0 lb

## 2010-12-26 DIAGNOSIS — E876 Hypokalemia: Secondary | ICD-10-CM

## 2010-12-26 DIAGNOSIS — I2510 Atherosclerotic heart disease of native coronary artery without angina pectoris: Secondary | ICD-10-CM

## 2010-12-26 DIAGNOSIS — R0602 Shortness of breath: Secondary | ICD-10-CM

## 2010-12-26 LAB — BASIC METABOLIC PANEL
BUN: 14 mg/dL (ref 6–23)
CO2: 29 mEq/L (ref 19–32)
Calcium: 9.4 mg/dL (ref 8.4–10.5)
Chloride: 100 mEq/L (ref 96–112)
Creatinine, Ser: 1.1 mg/dL (ref 0.4–1.2)
GFR: 52.68 mL/min — ABNORMAL LOW (ref >60.00)
Glucose, Bld: 86 mg/dL (ref 70–99)
Potassium: 4 mEq/L (ref 3.5–5.1)
Sodium: 140 mEq/L (ref 135–145)

## 2010-12-26 MED ORDER — NITROGLYCERIN 0.4 MG SL SUBL: 0.4000 mg | SUBLINGUAL_TABLET | SUBLINGUAL | Status: DC | PRN

## 2010-12-26 MED ORDER — NITROGLYCERIN 0.6 MG SL SUBL: 0.6000 mg | SUBLINGUAL_TABLET | SUBLINGUAL | Status: DC | PRN

## 2010-12-26 MED ORDER — ISOSORBIDE MONONITRATE ER 30 MG PO TB24: 30.0000 mg | ORAL_TABLET | Freq: Every day | ORAL | Status: DC

## 2010-12-26 NOTE — Assessment & Plan Note (Signed)
She has had now 2 spells of chest pain. She says this is very similar to her prior chest pain syndrome prior to her CABG. I have added Imdur 30 mg daily. We have refilled her NTG today as well. We will see her back in one month. Patient is agreeable to this plan and will call if any problems develop in the interim.

## 2010-12-26 NOTE — Assessment & Plan Note (Signed)
This is chronic. She does use oxygen prn. She has been able to go out of her home and do multiple activities without needing the oxygen. She is to see pulmonary later this month.

## 2010-12-26 NOTE — Telephone Encounter (Signed)
This encounter was created in error - please disregard.

## 2010-12-26 NOTE — Progress Notes (Signed)
Renee [name] Date of Birth: Jan 08, 1935 Medical Record #[id number]  History of Present Illness: Renee [name] is seen back today for a post ER visit. She is seen for Dr. [place]. She was in the ER just a few days ago with chest pain. Potassium was noted to be low. She has just had a negative Myoview prior to that ER visit. She notes that she had had her breakfast, had been to [place] and was at the drug store when she had the onset of substernal chest pain. She had radiation to her neck and jaw. She got short of breath. She got scared. She went home. She took NTG x 1 with some relief. She proceeded on the ER. Evaluation there was unremarkable except for a low potassium.   Since then, she has not had recurrence.   Current Outpatient Prescriptions on File Prior to Visit  Medication Sig Dispense Refill  . ALPRAZolam (XANAX) 0.5 MG tablet Take 0.5 mg by mouth daily as needed. Anxiety       . aspirin EC 81 MG tablet Take 81 mg by mouth daily.        . calcium citrate-vitamin D (CITRACAL+D) 315-200 MG-UNIT per tablet Take 1 tablet by mouth 2 (two) times daily.        [name] esomeprazole (NEXIUM) 40 MG capsule Take 40 mg by mouth daily before breakfast.        . fish oil-omega-3 fatty acids 1000 MG capsule Take 1 g by mouth daily.       . furosemide (LASIX) 40 MG tablet TAKE 1 TABLET BY MOUTH TWICE DAILY  60 tablet  5  . levothyroxine (SYNTHROID, LEVOTHROID) 50 MCG tablet Take 50 mcg by mouth daily.        . metoprolol tartrate (LOPRESSOR) 25 MG tablet Take 1 tablet (25 mg total) by mouth 2 (two) times daily.  60 tablet  5  . OXYGEN-HELIUM IN Inhale 3 L into the lungs as needed.        Bertram Gala Glycol-Propyl Glycol (SYSTANE OP) Apply 1 drop to eye daily.        . potassium chloride SA (K-DUR,KLOR-CON) 20 MEQ tablet Take 20 mEq by mouth 2 (two) times daily.        [name] tiotropium (SPIRIVA HANDIHALER) 18 MCG inhalation capsule Place 1 capsule (18 mcg total) into inhaler and inhale daily.  30 capsule  6  . traMADol  (ULTRAM) 50 MG tablet Take 50 mg by mouth every 6 (six) hours as needed. Pain       . UMECTA NAIL FILM 40 & 0.2 % KIT as directed.      [name] VITAMIN D, CHOLECALCIFEROL, PO Take 1 tablet by mouth daily.        [name] levofloxacin (LEVAQUIN) 500 MG tablet Take 1 tablet (500 mg total) by mouth daily.  10 tablet  0  . DISCONTD: ALPRAZolam (XANAX) 0.5 MG tablet Take 1 tablet by mouth as needed.      [name] DISCONTD: esomeprazole (NEXIUM) 40 MG capsule Take 40 mg by mouth daily before breakfast.        . DISCONTD: furosemide (LASIX) 40 MG tablet Take 40 mg by mouth 2 (two) times daily.        [name] DISCONTD: levothyroxine (SYNTHROID, LEVOTHROID) 50 MCG tablet Take 50 mcg by mouth daily.        [name] DISCONTD: metoprolol tartrate (LOPRESSOR) 25 MG tablet Take 25 mg by mouth 2 (two) times daily.        [name]  DISCONTD: Omega-3 Fatty Acids (FISH OIL) 1000 MG CAPS Take by mouth 3 (three) times daily.        [name] DISCONTD: potassium chloride SA (K-DUR,KLOR-CON) 20 MEQ tablet Take 1 tablet (20 mEq total) by mouth daily.  30 tablet  4  . DISCONTD: tiotropium (SPIRIVA) 18 MCG inhalation capsule Place 18 mcg into inhaler and inhale daily.        [name] DISCONTD: traMADol (ULTRAM) 50 MG tablet Take 1 tablet by mouth Every 4 hours as needed.        Allergies  Allergen Reactions  . Morphine     REACTION: double vision  . Penicillins     REACTION: swelling/rash  . Phenazopyridine Hcl     *piridium* REACTION: face red  . Pneumococcal Vaccine     REACTION: rash and cough  . Pneumococcal Vaccines Swelling  . Prednisone     REACTION: rash  . Prednisone Swelling  . Sulfonamide Derivatives     REACTION: rash  . Tylenol (Acetaminophen)     Tylenol #3 -- rash and swelling  . Penicillins Swelling and Rash  . Sulfa Antibiotics Rash    Past Medical History  Diagnosis Date  . GERD (gastroesophageal reflux disease)   . IBS (irritable bowel syndrome)   . Bundle branch block, right   . Coronary artery disease     prior CABG in August of  2011  . Emphysema/COPD   . Fatigue   . SOB (shortness of breath)   . Bruises easily   . Joint pain   . Weakness   . Anxiety   . Pain on voiding   . AF (atrial fibrillation)     Post op after CABG; off amiodarone  . Fracture of toe of left foot   . HTN (hypertension)   . CHF (congestive heart failure)   . Dysphagia   . Colon polyps   . Hypertension   . Hypercholesteremia     Past Surgical History  Procedure Date  . Coronary artery bypass graft   . Total hip arthroplasty 11/01/2008    right  . Appendectomy 1995  . Cholecystectomy 2008    LARYNX  . Tonsillectomy and adenoidectomy   . Total abdominal hysterectomy 1986  . Cardiac surgery     History  Smoking status  . Former Smoker -- 1.0 packs/day for 50 years  . Types: Cigarettes  . Quit date: 09/21/2009  Smokeless tobacco  . Not on file    History  Alcohol Use No    Family History  Problem Relation Age of Onset  . Heart disease Mother   . Transient ischemic attack Mother   . Heart attack Father   . Hypertension Father   . Diabetes Father   . Breast cancer Sister   . Other Sister     brain tumor  . Colon polyps Brother   . Colon cancer Neg Hx     Review of Systems: The review of systems is per the HPI.  All other systems were reviewed and are negative.  Physical Exam: BP 104/70  Pulse 68  Ht 5\' 6"  (1.676 m)  Wt 154 lb (69.854 kg)  BMI 24.86 kg/m2 Patient is very pleasant and in no acute distress. She is a little anxious today which is not unusual. Skin is warm and dry. Color is normal.  HEENT is unremarkable. Normocephalic/atraumatic. PERRL. Sclera are nonicteric. Neck is supple. No masses. No JVD. Lungs are clear. Cardiac exam shows a regular rate and rhythm. Sternum  remains a little tender to touch. Abdomen is soft. Extremities are without edema. Gait and ROM are intact. No gross neurologic deficits noted.   LABORATORY DATA:   Assessment / Plan:

## 2010-12-26 NOTE — Patient Instructions (Addendum)
We are going to add a long acting nitroglycerin to your medicines. It is Imdur 30 mg daily.  We are going to check your potassium today.  Use your NTG under your tongue for recurrent chest pain. May take one tablet every 5 minutes. If you are still having discomfort after 3 tablets in 15 minutes, call 911.  I am going to have you see Dr. Sw[place]iland in one month.

## 2010-12-26 NOTE — Assessment & Plan Note (Signed)
We will be rechecking her labs today.

## 2011-01-03 ENCOUNTER — Encounter: Payer: Self-pay | Admitting: Critical Care Medicine

## 2011-01-03 ENCOUNTER — Ambulatory Visit (INDEPENDENT_AMBULATORY_CARE_PROVIDER_SITE_OTHER): Payer: Medicare Other | Admitting: Critical Care Medicine

## 2011-01-03 DIAGNOSIS — J449 Chronic obstructive pulmonary disease, unspecified: Secondary | ICD-10-CM

## 2011-01-03 NOTE — Patient Instructions (Signed)
No change in medications. Return in        4 months Call cardiology if chest pain recurs

## 2011-01-03 NOTE — Progress Notes (Signed)
Subjective:    Patient ID: Renee [name], female    DOB: 10/08/1934, 76 y.o.   MRN: [id number]  HPI  76 y.o.   female with known hx of COPD     01/04/2011 Pt saw NP 10/12 and rx was: Levaquin 500mg  daily for 10 days  Mucinex DM Twice daily  As needed  Cough/congestion  We are referring you to Dr. Swaziland to evaluate your chest pain   Rx only 7days Levaquin,  Could not tolerate d/t nausea No more episodes of chest pain.  Not using imdur .Took two NTG twice.  This does help Lexican myoview.  Normal.  Now no cough.  Dyspnea is the same.      Past Medical History  Diagnosis Date  . GERD (gastroesophageal reflux disease)   . IBS (irritable bowel syndrome)   . Bundle branch block, right   . Coronary artery disease     prior CABG in August of 2011  . Emphysema/COPD   . Fatigue   . SOB (shortness of breath)   . Bruises easily   . Joint pain   . Weakness   . Anxiety   . Pain on voiding   . AF (atrial fibrillation)     Post op after CABG; off amiodarone  . Fracture of toe of left foot   . HTN (hypertension)   . CHF (congestive heart failure)   . Dysphagia   . Colon polyps   . Hypertension   . Hypercholesteremia   . Normal nuclear stress test October 2012     Family History  Problem Relation Age of Onset  . Heart disease Mother   . Transient ischemic attack Mother   . Heart attack Father   . Hypertension Father   . Diabetes Father   . Breast cancer Sister   . Other Sister     brain tumor  . Colon polyps Brother   . Colon cancer Neg Hx      History   Social History  . Marital Status: Married    Spouse Name: N/A    Number of Children: 1  . Years of Education: N/A   Occupational History  . nurse     retired   Social History Main Topics  . Smoking status: Former Smoker -- 1.0 packs/day for 50 years    Types: Cigarettes    Quit date: 09/21/2009  . Smokeless tobacco: Not on file  . Alcohol Use: No  . Drug Use: No  . Sexually Active:    Other  Topics Concern  . Not on file   Social History Narrative   ** Merged History Encounter ** Daily caffeine use: Dr Reino Kent and 2 cups coffee per day     Allergies  Allergen Reactions  . Levaquin Nausea Only    Unable to tolerate more than 7days  . Morphine     REACTION: double vision  . Penicillins     REACTION: swelling/rash  . Phenazopyridine Hcl     *piridium* REACTION: face red  . Pneumococcal Vaccine     REACTION: rash and cough  . Pneumococcal Vaccines Swelling  . Prednisone     REACTION: rash  . Prednisone Swelling  . Sulfonamide Derivatives     REACTION: rash  . Tylenol (Acetaminophen)     Tylenol #3 -- rash and swelling  . Penicillins Swelling and Rash  . Sulfa Antibiotics Rash     Outpatient Prescriptions Prior to Visit  Medication Sig Dispense Refill  . ALPRAZolam (  XANAX) 0.5 MG tablet Take 0.5 mg by mouth daily as needed. Anxiety       . aspirin EC 81 MG tablet Take 81 mg by mouth daily.        . calcium citrate-vitamin D (CITRACAL+D) 315-200 MG-UNIT per tablet Take 1 tablet by mouth 2 (two) times daily.        [name] esomeprazole (NEXIUM) 40 MG capsule Take 40 mg by mouth daily before breakfast.        . fish oil-omega-3 fatty acids 1000 MG capsule Take 1 g by mouth daily.       . furosemide (LASIX) 40 MG tablet TAKE 1 TABLET BY MOUTH TWICE DAILY  60 tablet  5  . levothyroxine (SYNTHROID, LEVOTHROID) 50 MCG tablet Take 50 mcg by mouth daily.        . metoprolol tartrate (LOPRESSOR) 25 MG tablet Take 1 tablet (25 mg total) by mouth 2 (two) times daily.  60 tablet  5  . nitroGLYCERIN (NITROSTAT) 0.4 MG SL tablet Place 1 tablet (0.4 mg total) under the tongue every 5 (five) minutes as needed for chest pain.  25 tablet  12  . Polyethyl Glycol-Propyl Glycol (SYSTANE OP) Apply 1 drop to eye daily.        . potassium chloride SA (K-DUR,KLOR-CON) 20 MEQ tablet Take 20 mEq by mouth 2 (two) times daily.        [name] tiotropium (SPIRIVA HANDIHALER) 18 MCG inhalation capsule Place 1  capsule (18 mcg total) into inhaler and inhale daily.  30 capsule  6  . traMADol (ULTRAM) 50 MG tablet Take 50 mg by mouth every 6 (six) hours as needed. Pain       . UMECTA NAIL FILM 40 & 0.2 % KIT as directed.      [name] VITAMIN D, CHOLECALCIFEROL, PO Take 1 tablet by mouth daily.        . isosorbide mononitrate (IMDUR) 30 MG 24 hr tablet Take 1 tablet (30 mg total) by mouth daily.  30 tablet  11  . levofloxacin (LEVAQUIN) 500 MG tablet Take 1 tablet (500 mg total) by mouth daily.  10 tablet  0  . OXYGEN-HELIUM IN Inhale 3 L into the lungs as needed.           Review of Systems  Constitutional:   No  weight loss, night sweats,  Fevers, chills, ++ fatigue, or  lassitude.  HEENT:   No headaches,  Difficulty swallowing,  Tooth/dental problems, or  Sore throat,                No sneezing, itching, ear ache,  ++nasal congestion, post nasal drip,   CV:   Orthopnea, PND, swelling in lower extremities, anasarca, dizziness, palpitations, syncope.   GI  No heartburn, indigestion, abdominal pain, nausea, vomiting, diarrhea, change in bowel habits, loss of appetite, bloody stools.   Resp:    No coughing up of blood.    No chest wall deformity  Skin: no rash or lesions.  GU: no dysuria, change in color of urine, no urgency or frequency.  No flank pain, no hematuria   MS:  No joint pain or swelling.  No decreased range of motion.   Psych:  No change in mood or affect. No depression or anxiety.            Objective:   Physical Exam  GEN: A/Ox3; pleasant , NAD, elderly  HEENT:  [place]/AT,  EACs-clear, TMs-wnl, NOSE-clear drainage, max tenderness THROAT-clear, no lesions,  no postnasal drip or exudate noted.   NECK:  Supple w/ fair ROM; no JVD; normal carotid impulses w/o bruits; no thyromegaly or nodules palpated; no lymphadenopathy.  RESP  Coarse BS w/ no wheezing no accessory muscle use, no dullness to percussion  CARD:  RRR, no m/r/g  , no peripheral edema, pulses intact, no cyanosis or  clubbing.  GI:   Soft & nt; nml bowel sounds; no organomegaly or masses detected.  Musco: Warm bil, no deformities or joint swelling noted.   Neuro: alert, no focal deficits noted.    Skin: Warm, no lesions or rashes         Assessment & Plan:  COPD Stable golds III Copd Plan No change in inhaled or maintenance medications. Return in  4 months        Updated Medication List Outpatient Encounter Prescriptions as of 01/03/2011  Medication Sig Dispense Refill  . ALPRAZolam (XANAX) 0.5 MG tablet Take 0.5 mg by mouth daily as needed. Anxiety       . aspirin EC 81 MG tablet Take 81 mg by mouth daily.        . calcium citrate-vitamin D (CITRACAL+D) 315-200 MG-UNIT per tablet Take 1 tablet by mouth 2 (two) times daily.        [name] esomeprazole (NEXIUM) 40 MG capsule Take 40 mg by mouth daily before breakfast.        . fish oil-omega-3 fatty acids 1000 MG capsule Take 1 g by mouth daily.       . furosemide (LASIX) 40 MG tablet TAKE 1 TABLET BY MOUTH TWICE DAILY  60 tablet  5  . levothyroxine (SYNTHROID, LEVOTHROID) 50 MCG tablet Take 50 mcg by mouth daily.        . metoprolol tartrate (LOPRESSOR) 25 MG tablet Take 1 tablet (25 mg total) by mouth 2 (two) times daily.  60 tablet  5  . nitroGLYCERIN (NITROSTAT) 0.4 MG SL tablet Place 1 tablet (0.4 mg total) under the tongue every 5 (five) minutes as needed for chest pain.  25 tablet  12  . Polyethyl Glycol-Propyl Glycol (SYSTANE OP) Apply 1 drop to eye daily.        . potassium chloride SA (K-DUR,KLOR-CON) 20 MEQ tablet Take 20 mEq by mouth 2 (two) times daily.        [name] tiotropium (SPIRIVA HANDIHALER) 18 MCG inhalation capsule Place 1 capsule (18 mcg total) into inhaler and inhale daily.  30 capsule  6  . traMADol (ULTRAM) 50 MG tablet Take 50 mg by mouth every 6 (six) hours as needed. Pain       . UMECTA NAIL FILM 40 & 0.2 % KIT as directed.      [name] VITAMIN D, CHOLECALCIFEROL, PO Take 1 tablet by mouth daily.        [name] DISCONTD:  isosorbide mononitrate (IMDUR) 30 MG 24 hr tablet Take 1 tablet (30 mg total) by mouth daily.  30 tablet  11  . DISCONTD: levofloxacin (LEVAQUIN) 500 MG tablet Take 1 tablet (500 mg total) by mouth daily.  10 tablet  0  . DISCONTD: OXYGEN-HELIUM IN Inhale 3 L into the lungs as needed.

## 2011-01-04 NOTE — Assessment & Plan Note (Signed)
Stable golds III Copd Plan No change in inhaled or maintenance medications. Return in  4 months

## 2011-01-25 ENCOUNTER — Encounter: Payer: Self-pay | Admitting: Cardiology

## 2011-01-25 ENCOUNTER — Ambulatory Visit (INDEPENDENT_AMBULATORY_CARE_PROVIDER_SITE_OTHER): Payer: Medicare Other | Admitting: Cardiology

## 2011-01-25 VITALS — BP 102/80 | HR 77 | Ht 66.0 in | Wt 153.0 lb

## 2011-01-25 DIAGNOSIS — I2510 Atherosclerotic heart disease of native coronary artery without angina pectoris: Secondary | ICD-10-CM

## 2011-01-25 DIAGNOSIS — R079 Chest pain, unspecified: Secondary | ICD-10-CM

## 2011-01-25 DIAGNOSIS — Z951 Presence of aortocoronary bypass graft: Secondary | ICD-10-CM

## 2011-01-25 NOTE — Patient Instructions (Signed)
Continue your current medications.  Take Ntg as needed for these attacks of pain.  I will see you again in 4 months.

## 2011-01-26 NOTE — Progress Notes (Signed)
Renee [name] Date of Birth: May 18, 1934 Medical Record #[id number]  History of Present Illness: Renee [name] is seen back today for a follow up visit. She states she had another episode of spasm in her chest radiating up into her neck and jaw. It was relieved with one sublingual nitroglycerin. She was unable to tolerate 30 mg of Imdur  because of dizziness. She has a lot of chest soreness and still complains greatly about her scar from her bypass surgery. She denies any increase shortness of breath or palpitations.    Current Outpatient Prescriptions on File Prior to Visit  Medication Sig Dispense Refill  . ALPRAZolam (XANAX) 0.5 MG tablet Take 0.5 mg by mouth daily as needed. Anxiety       . aspirin EC 81 MG tablet Take 81 mg by mouth daily.        . calcium citrate-vitamin D (CITRACAL+D) 315-200 MG-UNIT per tablet Take 1 tablet by mouth 2 (two) times daily.        [name] esomeprazole (NEXIUM) 40 MG capsule Take 40 mg by mouth daily before breakfast.        . fish oil-omega-3 fatty acids 1000 MG capsule Take 1 g by mouth daily.       . furosemide (LASIX) 40 MG tablet TAKE 1 TABLET BY MOUTH TWICE DAILY  60 tablet  5  . levothyroxine (SYNTHROID, LEVOTHROID) 50 MCG tablet Take 50 mcg by mouth daily.        . metoprolol tartrate (LOPRESSOR) 25 MG tablet Take 1 tablet (25 mg total) by mouth 2 (two) times daily.  60 tablet  5  . nitroGLYCERIN (NITROSTAT) 0.4 MG SL tablet Place 1 tablet (0.4 mg total) under the tongue every 5 (five) minutes as needed for chest pain.  25 tablet  12  . Polyethyl Glycol-Propyl Glycol (SYSTANE OP) Apply 1 drop to eye daily.        . potassium chloride SA (K-DUR,KLOR-CON) 20 MEQ tablet Take 20 mEq by mouth 2 (two) times daily.        [name] tiotropium (SPIRIVA HANDIHALER) 18 MCG inhalation capsule Place 1 capsule (18 mcg total) into inhaler and inhale daily.  30 capsule  6  . traMADol (ULTRAM) 50 MG tablet Take 50 mg by mouth every 6 (six) hours as needed. Pain       . VITAMIN D,  CHOLECALCIFEROL, PO Take 1 tablet by mouth daily.          Allergies  Allergen Reactions  . Levaquin Nausea Only    Unable to tolerate more than 7days  . Morphine     REACTION: double vision  . Penicillins     REACTION: swelling/rash  . Phenazopyridine Hcl     *piridium* REACTION: face red  . Pneumococcal Vaccine     REACTION: rash and cough  . Pneumococcal Vaccines Swelling  . Prednisone     REACTION: rash  . Prednisone Swelling  . Sulfonamide Derivatives     REACTION: rash  . Tylenol (Acetaminophen)     Tylenol #3 -- rash and swelling  . Penicillins Swelling and Rash  . Sulfa Antibiotics Rash    Past Medical History  Diagnosis Date  . GERD (gastroesophageal reflux disease)   . IBS (irritable bowel syndrome)   . Bundle branch block, right   . Coronary artery disease     prior CABG in August of 2011  . Emphysema/COPD   . Fatigue   . SOB (shortness of breath)   . Bruises easily   .  Joint pain   . Weakness   . Anxiety   . Pain on voiding   . AF (atrial fibrillation)     Post op after CABG; off amiodarone  . Fracture of toe of left foot   . HTN (hypertension)   . CHF (congestive heart failure)   . Dysphagia   . Colon polyps   . Hypertension   . Hypercholesteremia   . Normal nuclear stress test October 2012    Past Surgical History  Procedure Date  . Coronary artery bypass graft   . Total hip arthroplasty 11/01/2008    right  . Appendectomy 1995  . Cholecystectomy 2008    LARYNX  . Tonsillectomy and adenoidectomy   . Total abdominal hysterectomy 1986  . Cardiac surgery     History  Smoking status  . Former Smoker -- 1.0 packs/day for 50 years  . Types: Cigarettes  . Quit date: 09/21/2009  Smokeless tobacco  . Not on file    History  Alcohol Use No    Family History  Problem Relation Age of Onset  . Heart disease Mother   . Transient ischemic attack Mother   . Heart attack Father   . Hypertension Father   . Diabetes Father   . Breast  cancer Sister   . Other Sister     brain tumor  . Colon polyps Brother   . Colon cancer Neg Hx     Review of Systems: The review of systems is per the HPI.  All other systems were reviewed and are negative.  Physical Exam: BP 102/80  Pulse 77  Ht 5\' 6"  (1.676 m)  Wt 153 lb (69.4 kg)  BMI 24.69 kg/m2 Patient is very pleasant and in no acute distress. She is a little anxious today which is not unusual. Skin is warm and dry. Color is normal.  HEENT is unremarkable. Normocephalic/atraumatic. PERRL. Sclera are nonicteric. Neck is supple. No masses. No JVD. Lungs are clear. Cardiac exam shows a regular rate and rhythm. Sternum remains a little tender to touch. Abdomen is soft. Extremities are without edema. Gait and ROM are intact. No gross neurologic deficits noted.   LABORATORY DATA:  ECG today demonstrates normal sinus rhythm with a right bundle branch block. There is voltage criteria for LVH. No acute changes are noted. Assessment / Plan:

## 2011-01-26 NOTE — Assessment & Plan Note (Addendum)
Patient continues to have atypical chest pain symptoms. These occur in waves or spasms. She does get relief with nitroglycerin. There is no clear ischemic etiology. She is unable to tolerate long-acting nitrates because of dizziness. I recommended she continue to use the sublingual nitroglycerin as needed. I will followup again in 4 months.

## 2011-02-27 ENCOUNTER — Encounter: Payer: Self-pay | Admitting: Adult Health

## 2011-02-27 ENCOUNTER — Ambulatory Visit (INDEPENDENT_AMBULATORY_CARE_PROVIDER_SITE_OTHER): Payer: Medicare Other | Admitting: Adult Health

## 2011-02-27 VITALS — BP 122/82 | HR 78 | Temp 96.6°F | Ht 66.0 in | Wt 154.6 lb

## 2011-02-27 DIAGNOSIS — J449 Chronic obstructive pulmonary disease, unspecified: Secondary | ICD-10-CM

## 2011-02-27 MED ORDER — BUDESONIDE-FORMOTEROL FUMARATE 160-4.5 MCG/ACT IN AERO: 2.0000 | INHALATION_SPRAY | Freq: Two times a day (BID) | RESPIRATORY_TRACT | Status: DC

## 2011-02-27 NOTE — Progress Notes (Signed)
Subjective:    Patient ID: Renee [name], female    DOB: [birth year], 77 y.o.   MRN: [id number]  HPI  77 y.o.   female with known hx of COPD -former smoker -Gold 3   01/03/11  Pt saw NP 10/12 and rx was: Levaquin 500mg  daily for 10 days  Mucinex DM Twice daily  As needed  Cough/congestion  referring to  Dr. [place] to evaluate your chest pain   Rx only 7days Levaquin,  Could not tolerate d/t nausea No more episodes of chest pain.  Not using imdur .Took two NTG twice.  This does help Lexican myoview.  Normal.  Now no cough.  Dyspnea is the same. No changes   02/27/2011 Acute OV  Patient states sob has gotten worse in the past 4 weeks. Also, a little dry cough, and wheezing some.-but comes and goes.  Denies chest pain and chest tightness.  NO discolored mucus. No fever . Takes Spiriva daily . Wears out easilly and has to rest.  No edema.  CXR 12/2010 -NAD     Past Medical History  Diagnosis Date  . GERD (gastroesophageal reflux disease)   . IBS (irritable bowel syndrome)   . Bundle branch block, right   . Coronary artery disease     prior CABG in August of 2011  . Emphysema/COPD   . Fatigue   . SOB (shortness of breath)   . Bruises easily   . Joint pain   . Weakness   . Anxiety   . Pain on voiding   . AF (atrial fibrillation)     Post op after CABG; off amiodarone  . Fracture of toe of left foot   . HTN (hypertension)   . CHF (congestive heart failure)   . Dysphagia   . Colon polyps   . Hypertension   . Hypercholesteremia   . Normal nuclear stress test October 2012     Family History  Problem Relation Age of Onset  . Heart disease Mother   . Transient ischemic attack Mother   . Heart attack Father   . Hypertension Father   . Diabetes Father   . Breast cancer Sister   . Other Sister     brain tumor  . Colon polyps Brother   . Colon cancer Neg Hx      History   Social History  . Marital Status: Married    Spouse Name: N/A    Number of Children: 1    . Years of Education: N/A   Occupational History  . nurse     retired   Social History Main Topics  . Smoking status: Former Smoker -- 1.0 packs/day for 50 years    Types: Cigarettes    Quit date: 09/21/2009  . Smokeless tobacco: Not on file  . Alcohol Use: No  . Drug Use: No  . Sexually Active:    Other Topics Concern  . Not on file   Social History Narrative   ** Merged History Encounter ** Daily caffeine use: Dr Reino Kent and 2 cups coffee per day     Allergies  Allergen Reactions  . Levaquin Nausea Only    Unable to tolerate more than 7days  . Morphine     REACTION: double vision  . Penicillins     REACTION: swelling/rash  . Phenazopyridine Hcl     *piridium* REACTION: face red  . Pneumococcal Vaccine     REACTION: rash and cough  . Pneumococcal Vaccines Swelling  .  Prednisone     REACTION: rash  . Prednisone Swelling  . Sulfonamide Derivatives     REACTION: rash  . Tylenol (Acetaminophen)     Tylenol #3 -- rash and swelling  . Penicillins Swelling and Rash  . Sulfa Antibiotics Rash     Outpatient Prescriptions Prior to Visit  Medication Sig Dispense Refill  . ALPRAZolam (XANAX) 0.5 MG tablet Take 0.5 mg by mouth daily as needed. Anxiety       . aspirin EC 81 MG tablet Take 81 mg by mouth daily.        . calcium citrate-vitamin D (CITRACAL+D) 315-200 MG-UNIT per tablet Take 1 tablet by mouth 2 (two) times daily.        [name] esomeprazole (NEXIUM) 40 MG capsule Take 40 mg by mouth daily before breakfast.        . fish oil-omega-3 fatty acids 1000 MG capsule Take 1 g by mouth daily.       . furosemide (LASIX) 40 MG tablet TAKE 1 TABLET BY MOUTH TWICE DAILY  60 tablet  5  . levothyroxine (SYNTHROID, LEVOTHROID) 50 MCG tablet Take 50 mcg by mouth daily.        . metoprolol tartrate (LOPRESSOR) 25 MG tablet Take 1 tablet (25 mg total) by mouth 2 (two) times daily.  60 tablet  5  . nitroGLYCERIN (NITROSTAT) 0.4 MG SL tablet Place 1 tablet (0.4 mg total) under the  tongue every 5 (five) minutes as needed for chest pain.  25 tablet  12  . Polyethyl Glycol-Propyl Glycol (SYSTANE OP) Apply 1 drop to eye daily.        . potassium chloride SA (K-DUR,KLOR-CON) 20 MEQ tablet Take 20 mEq by mouth 2 (two) times daily.        [name] tiotropium (SPIRIVA HANDIHALER) 18 MCG inhalation capsule Place 1 capsule (18 mcg total) into inhaler and inhale daily.  30 capsule  6  . traMADol (ULTRAM) 50 MG tablet Take 50 mg by mouth every 6 (six) hours as needed. Pain       . VITAMIN D, CHOLECALCIFEROL, PO Take 1 tablet by mouth daily.           Review of Systems  Constitutional:   No  weight loss, night sweats,  Fevers, chills, ++ fatigue, or  lassitude.  HEENT:   No headaches,  Difficulty swallowing,  Tooth/dental problems, or  Sore throat,                No sneezing, itching, ear ache, nasal congestion, post nasal drip,   CV:   Orthopnea, PND, swelling in lower extremities, anasarca, dizziness, palpitations, syncope.   GI  No heartburn, indigestion, abdominal pain, nausea, vomiting, diarrhea, change in bowel habits, loss of appetite, bloody stools.   Resp:    No coughing up of blood.    No chest wall deformity  Skin: no rash or lesions.  GU: no dysuria, change in color of urine, no urgency or frequency.  No flank pain, no hematuria   MS:  No joint pain or swelling.  No decreased range of motion.   Psych:  No change in mood or affect. No depression or anxiety.            Objective:   Physical Exam  GEN: A/Ox3; pleasant , NAD, elderly  HEENT:  [place]/AT,  EACs-clear, TMs-wnl, NOSE-clear drainage THROAT-clear, no lesions, no postnasal drip or exudate noted.   NECK:  Supple w/ fair ROM; no JVD; normal carotid impulses  w/o bruits; no thyromegaly or nodules palpated; no lymphadenopathy.  RESP  Coarse BS w/ no wheezing no accessory muscle use, no dullness to percussion  CARD:  RRR, no m/r/g  , no peripheral edema, pulses intact, no cyanosis or clubbing.  GI:   Soft  & nt; nml bowel sounds; no organomegaly or masses detected.  Musco: Warm bil, no deformities or joint swelling noted.   Neuro: alert, no focal deficits noted.    Skin: Warm, no lesions or rashes      CXR 12/2010  NAD -chronic changes     Assessment & Plan:  No problem-specific assessment & plan notes found for this encounter.      Updated Medication List Outpatient Encounter Prescriptions as of 02/27/2011  Medication Sig Dispense Refill  . ALPRAZolam (XANAX) 0.5 MG tablet Take 0.5 mg by mouth daily as needed. Anxiety       . aspirin EC 81 MG tablet Take 81 mg by mouth daily.        . calcium citrate-vitamin D (CITRACAL+D) 315-200 MG-UNIT per tablet Take 1 tablet by mouth 2 (two) times daily.        [name] esomeprazole (NEXIUM) 40 MG capsule Take 40 mg by mouth daily before breakfast.        . fish oil-omega-3 fatty acids 1000 MG capsule Take 1 g by mouth daily.       . furosemide (LASIX) 40 MG tablet TAKE 1 TABLET BY MOUTH TWICE DAILY  60 tablet  5  . levothyroxine (SYNTHROID, LEVOTHROID) 50 MCG tablet Take 50 mcg by mouth daily.        . metoprolol tartrate (LOPRESSOR) 25 MG tablet Take 1 tablet (25 mg total) by mouth 2 (two) times daily.  60 tablet  5  . nitroGLYCERIN (NITROSTAT) 0.4 MG SL tablet Place 1 tablet (0.4 mg total) under the tongue every 5 (five) minutes as needed for chest pain.  25 tablet  12  . Polyethyl Glycol-Propyl Glycol (SYSTANE OP) Apply 1 drop to eye daily.        . potassium chloride SA (K-DUR,KLOR-CON) 20 MEQ tablet Take 20 mEq by mouth 2 (two) times daily.        [name] tiotropium (SPIRIVA HANDIHALER) 18 MCG inhalation capsule Place 1 capsule (18 mcg total) into inhaler and inhale daily.  30 capsule  6  . traMADol (ULTRAM) 50 MG tablet Take 50 mg by mouth every 6 (six) hours as needed. Pain       . VITAMIN D, CHOLECALCIFEROL, PO Take 1 tablet by mouth daily.

## 2011-02-27 NOTE — Assessment & Plan Note (Addendum)
Not as optimally compensated on Spiriva alone  Trial of Symbicort 160   Plan:  Begin Symbicort 160/4.72mcg 2 puffs Twice daily   Continue on Spiriva 1 puff daily  follow up Dr. Delford Field  In 2 months and As needed   Brush/rinse and gargle after inhaler use

## 2011-02-27 NOTE — Patient Instructions (Signed)
Begin Symbicort 160/4.26mcg 2 puffs Twice daily   Continue on Spiriva 1 puff daily  follow up Dr. Delford Field  In 2 months and As needed   Brush/rinse and gargle after inhaler use

## 2011-03-15 DIAGNOSIS — E538 Deficiency of other specified B group vitamins: Secondary | ICD-10-CM | POA: Diagnosis not present

## 2011-03-17 ENCOUNTER — Ambulatory Visit: Payer: Medicare Other

## 2011-03-17 ENCOUNTER — Ambulatory Visit (INDEPENDENT_AMBULATORY_CARE_PROVIDER_SITE_OTHER): Payer: Medicare Other | Admitting: Family Medicine

## 2011-03-17 VITALS — BP 162/79 | HR 96 | Temp 98.2°F | Resp 18

## 2011-03-17 DIAGNOSIS — M5430 Sciatica, unspecified side: Secondary | ICD-10-CM

## 2011-03-17 DIAGNOSIS — J449 Chronic obstructive pulmonary disease, unspecified: Secondary | ICD-10-CM | POA: Diagnosis not present

## 2011-03-17 DIAGNOSIS — R05 Cough: Secondary | ICD-10-CM | POA: Diagnosis not present

## 2011-03-17 DIAGNOSIS — M549 Dorsalgia, unspecified: Secondary | ICD-10-CM

## 2011-03-17 DIAGNOSIS — J441 Chronic obstructive pulmonary disease with (acute) exacerbation: Secondary | ICD-10-CM

## 2011-03-17 DIAGNOSIS — R51 Headache: Secondary | ICD-10-CM

## 2011-03-17 DIAGNOSIS — R059 Cough, unspecified: Secondary | ICD-10-CM

## 2011-03-17 LAB — POCT CBC
HCT, POC: 43.3 % (ref 37.7–47.9)
Hemoglobin: 13.4 g/dL (ref 12.2–16.2)
MCH, POC: 27.6 pg (ref 27–31.2)
MCV: 89.3 fL (ref 80–97)
MID (cbc): 0.9 (ref 0–0.9)
RBC: 4.85 M/uL (ref 4.04–5.48)
WBC: 9.8 10*3/uL (ref 4.6–10.2)

## 2011-03-17 MED ORDER — AZITHROMYCIN 250 MG PO TABS: ORAL_TABLET | ORAL | Status: AC

## 2011-03-17 MED ORDER — BENZONATATE 100 MG PO CAPS: 100.0000 mg | ORAL_CAPSULE | Freq: Three times a day (TID) | ORAL | Status: AC | PRN

## 2011-03-17 MED ORDER — TRAMADOL HCL 50 MG PO TABS: 50.0000 mg | ORAL_TABLET | Freq: Four times a day (QID) | ORAL | Status: DC | PRN

## 2011-03-17 MED ORDER — ACETAMINOPHEN 325 MG PO TABS
1000.0000 mg | ORAL_TABLET | Freq: Once | ORAL | Status: AC
  Administered 2011-03-17: 975 mg via ORAL

## 2011-03-17 NOTE — Patient Instructions (Signed)
Patient is to continue using her oxygen on an as-needed basis. She is urged to drink a lot of water because the better hydrated she states that the end of the mucus will be. She will be treated with Zithromax, and given some Tessalon for the cough. She is to speak to the pharmacist about whether the Tessalon can dry her nose, but I do not believe that is a problem.  Should she get increasingly short of breath or start running a fever or overall doing worse she should get a recheck or go to the emergency room if necessary

## 2011-03-17 NOTE — Progress Notes (Signed)
  Subjective:    Patient ID: Renee [name], female    DOB: Feb 29, 1934, 77 y.o.   MRN: [id number]  HPI T[place]ow is here today complaining of a cough that had its onset last night. She has a long history of COPD. Last summer she had heart surgery. She has had problems with respiratory tract infections and has had a lot of Z-Paks apparently. However this started a little differently, starting primarily with a cough. She has not been febrile but she has been having chills which probably represent a fever. Her nose is been running some watery stuff but not having a purulent drainage there her cough however does bring up yellow purulent phlegm.   Her other complaint was that she has sciatica. She has a history of having had a lot of that, usually is up to a taking tramadol for it. She has had a hip replacement on the right side. Usually it is the left side. The pain goes from the very low lumbar region into the right SI area right hip and some down into the right thigh. She took extra strength Tylenol for the pain, 2 of them, but did not have any tramadol.   Review of Systems cardiovascular no current acute complaints no chest pains. Respiratory as above wears home oxygen on a when necessary basis now they had put her on at night but she's not compulsively wearing it at night any longer GI unremarkable     Objective:   Physical Exam Pleasant white female who is uncomfortable with this persistent cough from the time I entered the room she has kept on coughing. TMs have scarring old right TM perforation. Her throat was clear neck supple without significant nodes though she is tender in the lower anterior neck from where the surgery incised. Chest has some mild scattered wheezes a few rhonchi more left upper area her heart was regular without murmurs abdomen soft her ankles were nonedematous straight leg raising test essentially negative. She does her if her right leg is moved around too fast but she was able  to straight leg raise to 80.    [facility] reading (PRIMARY) by  Dr. Alwyn Ren COPD but no acute infiltrates    Assessment & Plan:  COPD with exacerbation, rule out pneumonia Lumbar sciatica with right radiculopathy  History of heart surgery  History of right hip surgery  We'll check a CBC and chest x-ray and then decide on treatment we checked a pulse oximetry which came in it was 99% on her portable oxygen, but at this time is 94% on room air.  Patient complained of a headache so she was given some Tylenol for that she got short of breath while she was waiting in the room because her home O2 had run out and we gave her some oxygen by nasal cannula. Her O2 saturations were good.  Chest x-ray was normal as was a CBC. However with a purulent phlegm she has been bringing up went ahead and treated her with a Z-Pak, Tessalon for cough fluids, rest,  and a refill on her tramadol for her back pain and sciatica

## 2011-03-29 DIAGNOSIS — H11009 Unspecified pterygium of unspecified eye: Secondary | ICD-10-CM | POA: Diagnosis not present

## 2011-03-29 DIAGNOSIS — H35379 Puckering of macula, unspecified eye: Secondary | ICD-10-CM | POA: Diagnosis not present

## 2011-03-30 DIAGNOSIS — D239 Other benign neoplasm of skin, unspecified: Secondary | ICD-10-CM | POA: Diagnosis not present

## 2011-03-30 DIAGNOSIS — I872 Venous insufficiency (chronic) (peripheral): Secondary | ICD-10-CM | POA: Diagnosis not present

## 2011-03-30 DIAGNOSIS — J449 Chronic obstructive pulmonary disease, unspecified: Secondary | ICD-10-CM | POA: Diagnosis not present

## 2011-03-30 DIAGNOSIS — R05 Cough: Secondary | ICD-10-CM | POA: Diagnosis not present

## 2011-03-30 DIAGNOSIS — I10 Essential (primary) hypertension: Secondary | ICD-10-CM | POA: Diagnosis not present

## 2011-04-13 DIAGNOSIS — H35379 Puckering of macula, unspecified eye: Secondary | ICD-10-CM | POA: Diagnosis not present

## 2011-04-13 DIAGNOSIS — H538 Other visual disturbances: Secondary | ICD-10-CM | POA: Diagnosis not present

## 2011-04-13 DIAGNOSIS — H26499 Other secondary cataract, unspecified eye: Secondary | ICD-10-CM | POA: Diagnosis not present

## 2011-04-13 DIAGNOSIS — Z961 Presence of intraocular lens: Secondary | ICD-10-CM | POA: Diagnosis not present

## 2011-04-18 DIAGNOSIS — D531 Other megaloblastic anemias, not elsewhere classified: Secondary | ICD-10-CM | POA: Diagnosis not present

## 2011-04-18 DIAGNOSIS — E039 Hypothyroidism, unspecified: Secondary | ICD-10-CM | POA: Diagnosis not present

## 2011-04-18 DIAGNOSIS — E559 Vitamin D deficiency, unspecified: Secondary | ICD-10-CM | POA: Diagnosis not present

## 2011-04-18 DIAGNOSIS — M899 Disorder of bone, unspecified: Secondary | ICD-10-CM | POA: Diagnosis not present

## 2011-04-18 DIAGNOSIS — I10 Essential (primary) hypertension: Secondary | ICD-10-CM | POA: Diagnosis not present

## 2011-04-18 DIAGNOSIS — E785 Hyperlipidemia, unspecified: Secondary | ICD-10-CM | POA: Diagnosis not present

## 2011-04-18 DIAGNOSIS — M949 Disorder of cartilage, unspecified: Secondary | ICD-10-CM | POA: Diagnosis not present

## 2011-04-18 DIAGNOSIS — E538 Deficiency of other specified B group vitamins: Secondary | ICD-10-CM | POA: Diagnosis not present

## 2011-04-25 ENCOUNTER — Other Ambulatory Visit: Payer: Self-pay | Admitting: Cardiology

## 2011-04-25 DIAGNOSIS — E538 Deficiency of other specified B group vitamins: Secondary | ICD-10-CM | POA: Diagnosis not present

## 2011-04-25 DIAGNOSIS — Z23 Encounter for immunization: Secondary | ICD-10-CM | POA: Diagnosis not present

## 2011-04-25 DIAGNOSIS — E785 Hyperlipidemia, unspecified: Secondary | ICD-10-CM | POA: Diagnosis not present

## 2011-04-25 DIAGNOSIS — R748 Abnormal levels of other serum enzymes: Secondary | ICD-10-CM | POA: Diagnosis not present

## 2011-04-25 DIAGNOSIS — I10 Essential (primary) hypertension: Secondary | ICD-10-CM | POA: Diagnosis not present

## 2011-04-25 DIAGNOSIS — Z0000 Encounter for general adult medical examination without abnormal findings: Secondary | ICD-10-CM | POA: Diagnosis not present

## 2011-04-30 ENCOUNTER — Encounter: Payer: Self-pay | Admitting: Critical Care Medicine

## 2011-04-30 ENCOUNTER — Ambulatory Visit (INDEPENDENT_AMBULATORY_CARE_PROVIDER_SITE_OTHER): Payer: Medicare Other | Admitting: Critical Care Medicine

## 2011-04-30 VITALS — BP 124/88 | HR 87 | Temp 98.0°F | Ht 66.0 in | Wt 154.6 lb

## 2011-04-30 DIAGNOSIS — J449 Chronic obstructive pulmonary disease, unspecified: Secondary | ICD-10-CM

## 2011-04-30 NOTE — Assessment & Plan Note (Signed)
Stable golds C. COPD The patient did not tolerate long-acting bronchodilator Plan Maintain off oxygen Maintain Spiriva Return 4 months

## 2011-04-30 NOTE — Progress Notes (Signed)
Subjective:    Patient ID: Renee [name], female    DOB: September 15, 1934, 77 y.o.   MRN: [id number]  HPI  77 y.o.   female with known hx of COPD -former smoker -Gold 3   1/13Acute OV  Patient states sob has gotten worse in the past 4 weeks. Also, a little dry cough, and wheezing some.-but comes and goes.  Denies chest pain and chest tightness.  NO discolored mucus. No fever . Takes Spiriva daily . Wears out easilly and has to rest.  No edema.  CXR 12/2010 -NAD   04/30/2011 Saw NP 1/13 for exac . She rec symbicort but not able to tolerate d/t tachycardia Now on spiriva alone  Now dyspnea is the same, worse in cold, rainy weather. Cough is better. No chest pain. Pt has hyperthyroid.  Pt denies any significant sore throat, nasal congestion or excess secretions, fever, chills, sweats, unintended weight loss, pleurtic or exertional chest pain, orthopnea PND, or leg swelling Pt denies any increase in rescue therapy over baseline, denies waking up needing it or having any early am or nocturnal exacerbations of coughing/wheezing/or dyspnea. Pt also denies any obvious fluctuation in symptoms with  weather or environmental change or other alleviating or aggravating factors    Past Medical History  Diagnosis Date  . GERD (gastroesophageal reflux disease)   . IBS (irritable bowel syndrome)   . Bundle branch block, right   . Coronary artery disease     prior CABG in August of 2011  . Emphysema/COPD   . Fatigue   . SOB (shortness of breath)   . Bruises easily   . Joint pain   . Weakness   . Anxiety   . Pain on voiding   . AF (atrial fibrillation)     Post op after CABG; off amiodarone  . Fracture of toe of left foot   . HTN (hypertension)   . CHF (congestive heart failure)   . Dysphagia   . Colon polyps   . Hypertension   . Hypercholesteremia   . Normal nuclear stress test October 2012     Family History  Problem Relation Age of Onset  . Heart disease Mother   . Transient  ischemic attack Mother   . Heart attack Father   . Hypertension Father   . Diabetes Father   . Breast cancer Sister   . Other Sister     brain tumor  . Colon polyps Brother   . Colon cancer Neg Hx      History   Social History  . Marital Status: Married    Spouse Name: N/A    Number of Children: 1  . Years of Education: N/A   Occupational History  . nurse     retired   Social History Main Topics  . Smoking status: Former Smoker -- 1.0 packs/day for 50 years    Types: Cigarettes    Quit date: 09/21/2009  . Smokeless tobacco: Not on file  . Alcohol Use: No  . Drug Use: No  . Sexually Active:    Other Topics Concern  . Not on file   Social History Narrative   ** Merged History Encounter ** Daily caffeine use: Dr Reino Kent and 2 cups coffee per day     Allergies  Allergen Reactions  . Levaquin Nausea Only    Unable to tolerate more than 7days  . Morphine     REACTION: double vision  . Penicillins     REACTION: swelling/rash  .  Phenazopyridine Hcl     *piridium* REACTION: face red  . Pneumococcal Vaccine     REACTION: rash and cough  . Pneumococcal Vaccines Swelling  . Prednisone     REACTION: rash  . Prednisone Swelling  . Sulfonamide Derivatives     REACTION: rash  . Symbicort     Tachycardia  . Tylenol (Acetaminophen)     Tylenol #3 -- rash and swelling  . Penicillins Swelling and Rash  . Sulfa Antibiotics Rash     Outpatient Prescriptions Prior to Visit  Medication Sig Dispense Refill  . aspirin EC 81 MG tablet Take 81 mg by mouth daily.        [name] esomeprazole (NEXIUM) 40 MG capsule Take 40 mg by mouth daily before breakfast.        . fish oil-omega-3 fatty acids 1000 MG capsule Take 1 g by mouth daily.       . furosemide (LASIX) 40 MG tablet TAKE 1 TABLET BY MOUTH TWICE DAILY  60 tablet  5  . metoprolol tartrate (LOPRESSOR) 25 MG tablet TAKE 1 TABLET BY MOUTH TWICE DAILY  60 tablet  4  . nitroGLYCERIN (NITROSTAT) 0.4 MG SL tablet Place 1 tablet (0.4  mg total) under the tongue every 5 (five) minutes as needed for chest pain.  25 tablet  12  . Polyethyl Glycol-Propyl Glycol (SYSTANE OP) Apply 1 drop to eye daily.        . potassium chloride SA (K-DUR,KLOR-CON) 20 MEQ tablet Take 20 mEq by mouth 2 (two) times daily.        [name] tiotropium (SPIRIVA HANDIHALER) 18 MCG inhalation capsule Place 1 capsule (18 mcg total) into inhaler and inhale daily.  30 capsule  6  . traMADol (ULTRAM) 50 MG tablet Take 1 tablet (50 mg total) by mouth every 6 (six) hours as needed. Pain  30 tablet  0  . VITAMIN D, CHOLECALCIFEROL, PO Take 1 tablet by mouth daily.        [name] ALPRAZolam (XANAX) 0.5 MG tablet Take 0.5 mg by mouth daily as needed. Anxiety       . budesonide-formoterol (SYMBICORT) 160-4.5 MCG/ACT inhaler Inhale 2 puffs into the lungs 2 (two) times daily.  1 Inhaler  12  . calcium citrate-vitamin D (CITRACAL+D) 315-200 MG-UNIT per tablet Take 1 tablet by mouth 2 (two) times daily.        [name] levothyroxine (SYNTHROID, LEVOTHROID) 50 MCG tablet Take 50 mcg by mouth daily.           Review of Systems  Constitutional:   No  weight loss, night sweats,  Fevers, chills, ++ fatigue, or  lassitude.  HEENT:   No headaches,  Difficulty swallowing,  Tooth/dental problems, or  Sore throat,                No sneezing, itching, ear ache, nasal congestion, post nasal drip,   CV:   Orthopnea, PND, swelling in lower extremities, anasarca, dizziness, palpitations, syncope.   GI  No heartburn, indigestion, abdominal pain, nausea, vomiting, diarrhea, change in bowel habits, loss of appetite, bloody stools.   Resp:    No coughing up of blood.    No chest wall deformity  Skin: no rash or lesions.  GU: no dysuria, change in color of urine, no urgency or frequency.  No flank pain, no hematuria   MS:  No joint pain or swelling.  No decreased range of motion.   Psych:  No change in mood or affect. No  depression or anxiety.            Objective:   Physical Exam BP  124/88  Pulse 87  Temp(Src) 98 F (36.7 C) (Oral)  Ht 5\' 6"  (1.676 m)  Wt 154 lb 9.6 oz (70.126 kg)  BMI 24.95 kg/m2  SpO2 96%  GEN: A/Ox3; pleasant , NAD, elderly  HEENT:  [place]/AT,  EACs-clear, TMs-wnl, NOSE-clear drainage THROAT-clear, no lesions, no postnasal drip or exudate noted.   NECK:  Supple w/ fair ROM; no JVD; normal carotid impulses w/o bruits; no thyromegaly or nodules palpated; no lymphadenopathy.  RESP  Coarse BS w/ no wheezing no accessory muscle use, no dullness to percussion  CARD:  RRR, no m/r/g  , no peripheral edema, pulses intact, no cyanosis or clubbing.  GI:   Soft & nt; nml bowel sounds; no organomegaly or masses detected.  Musco: Warm bil, no deformities or joint swelling noted.   Neuro: alert, no focal deficits noted.    Skin: Warm, no lesions or rashes      CXR 12/2010  NAD -chronic changes     Assessment & Plan:  COPD Stable golds C. COPD The patient did not tolerate long-acting bronchodilator Plan Maintain off oxygen Maintain Spiriva Return 4 months        Updated Medication List Outpatient Encounter Prescriptions as of 04/30/2011  Medication Sig Dispense Refill  . aspirin EC 81 MG tablet Take 81 mg by mouth daily.        . diazepam (VALIUM) 5 MG tablet as needed.      [name] esomeprazole (NEXIUM) 40 MG capsule Take 40 mg by mouth daily before breakfast.        . fish oil-omega-3 fatty acids 1000 MG capsule Take 1 g by mouth daily.       . furosemide (LASIX) 40 MG tablet TAKE 1 TABLET BY MOUTH TWICE DAILY  60 tablet  5  . metoprolol tartrate (LOPRESSOR) 25 MG tablet TAKE 1 TABLET BY MOUTH TWICE DAILY  60 tablet  4  . nitroGLYCERIN (NITROSTAT) 0.4 MG SL tablet Place 1 tablet (0.4 mg total) under the tongue every 5 (five) minutes as needed for chest pain.  25 tablet  12  . Polyethyl Glycol-Propyl Glycol (SYSTANE OP) Apply 1 drop to eye daily.        . potassium chloride SA (K-DUR,KLOR-CON) 20 MEQ tablet Take 20 mEq by mouth 2 (two) times  daily.        [name] tiotropium (SPIRIVA HANDIHALER) 18 MCG inhalation capsule Place 1 capsule (18 mcg total) into inhaler and inhale daily.  30 capsule  6  . traMADol (ULTRAM) 50 MG tablet Take 1 tablet (50 mg total) by mouth every 6 (six) hours as needed. Pain  30 tablet  0  . VITAMIN D, CHOLECALCIFEROL, PO Take 1 tablet by mouth daily.        [name] DISCONTD: ALPRAZolam (XANAX) 0.5 MG tablet Take 0.5 mg by mouth daily as needed. Anxiety       . DISCONTD: budesonide-formoterol (SYMBICORT) 160-4.5 MCG/ACT inhaler Inhale 2 puffs into the lungs 2 (two) times daily.  1 Inhaler  12  . DISCONTD: calcium citrate-vitamin D (CITRACAL+D) 315-200 MG-UNIT per tablet Take 1 tablet by mouth 2 (two) times daily.        [name] DISCONTD: escitalopram (LEXAPRO) 10 MG tablet       . DISCONTD: levothyroxine (SYNTHROID, LEVOTHROID) 50 MCG tablet Take 50 mcg by mouth daily.

## 2011-04-30 NOTE — Patient Instructions (Signed)
No change in medications. Return in         4 months 

## 2011-05-01 ENCOUNTER — Telehealth: Payer: Self-pay | Admitting: Critical Care Medicine

## 2011-05-01 NOTE — Telephone Encounter (Signed)
Patient called back to let Pw know that the medication she is taking is Livalo 2mg , she takes 1/2 tablet at bedtime.  I have added this to the pt's medication list and will forward to Pw so he is aware.

## 2011-05-10 ENCOUNTER — Telehealth: Payer: Self-pay | Admitting: Critical Care Medicine

## 2011-05-10 DIAGNOSIS — J449 Chronic obstructive pulmonary disease, unspecified: Secondary | ICD-10-CM

## 2011-05-10 NOTE — Telephone Encounter (Signed)
Lmtcb x1

## 2011-05-10 NOTE — Telephone Encounter (Signed)
At last OV she told me she was off oxygen Her sats were normal at rest  Obtain:  ONO RA and have [facility] go out to house to do pulse ox check with exertion on RA or have pt come to office for same

## 2011-05-10 NOTE — Telephone Encounter (Signed)
I spoke with pt and she stated she believes her and PW had a miscommunication regarding her oxygen. She states that she wanted to let PW know that she does wear her oxygen PRN and she states she believes he thinks she does not use it at all. She just wanted to clear this up bc she states medicare will not pay for oxygen if PW does not document she uses her oxygen PRN. I advised will forward to PW so he is aware of how she uses her oxygen. Please advise, Dr. Delford Field thanks

## 2011-05-10 NOTE — Telephone Encounter (Signed)
Returning call can be reached at [phone].Raylene Everts

## 2011-05-10 NOTE — Telephone Encounter (Signed)
I spoke with pt and is aware will have ono on ra and pulse ox w/ exertion on ra. She voiced her understanding and needed nothing further

## 2011-05-14 DIAGNOSIS — S62309A Unspecified fracture of unspecified metacarpal bone, initial encounter for closed fracture: Secondary | ICD-10-CM | POA: Diagnosis not present

## 2011-05-21 DIAGNOSIS — M545 Low back pain, unspecified: Secondary | ICD-10-CM | POA: Diagnosis not present

## 2011-05-21 DIAGNOSIS — M47817 Spondylosis without myelopathy or radiculopathy, lumbosacral region: Secondary | ICD-10-CM | POA: Diagnosis not present

## 2011-05-21 DIAGNOSIS — IMO0002 Reserved for concepts with insufficient information to code with codable children: Secondary | ICD-10-CM | POA: Diagnosis not present

## 2011-05-21 DIAGNOSIS — M546 Pain in thoracic spine: Secondary | ICD-10-CM | POA: Diagnosis not present

## 2011-05-21 DIAGNOSIS — M5137 Other intervertebral disc degeneration, lumbosacral region: Secondary | ICD-10-CM | POA: Diagnosis not present

## 2011-05-22 ENCOUNTER — Encounter: Payer: Self-pay | Admitting: Critical Care Medicine

## 2011-05-22 DIAGNOSIS — S62309A Unspecified fracture of unspecified metacarpal bone, initial encounter for closed fracture: Secondary | ICD-10-CM | POA: Diagnosis not present

## 2011-05-22 DIAGNOSIS — R748 Abnormal levels of other serum enzymes: Secondary | ICD-10-CM | POA: Diagnosis not present

## 2011-05-25 ENCOUNTER — Encounter: Payer: Self-pay | Admitting: Critical Care Medicine

## 2011-05-28 ENCOUNTER — Encounter: Payer: Self-pay | Admitting: Cardiology

## 2011-05-28 ENCOUNTER — Ambulatory Visit (INDEPENDENT_AMBULATORY_CARE_PROVIDER_SITE_OTHER): Payer: Medicare Other | Admitting: Cardiology

## 2011-05-28 VITALS — BP 134/70 | HR 74 | Ht 66.0 in | Wt 153.0 lb

## 2011-05-28 DIAGNOSIS — I10 Essential (primary) hypertension: Secondary | ICD-10-CM

## 2011-05-28 DIAGNOSIS — R0789 Other chest pain: Secondary | ICD-10-CM

## 2011-05-28 DIAGNOSIS — R071 Chest pain on breathing: Secondary | ICD-10-CM

## 2011-05-28 DIAGNOSIS — R238 Other skin changes: Secondary | ICD-10-CM

## 2011-05-28 DIAGNOSIS — R233 Spontaneous ecchymoses: Secondary | ICD-10-CM

## 2011-05-28 DIAGNOSIS — I2510 Atherosclerotic heart disease of native coronary artery without angina pectoris: Secondary | ICD-10-CM | POA: Diagnosis not present

## 2011-05-28 NOTE — Assessment & Plan Note (Signed)
She is doing quite well from a cardiac standpoint now. She still has chronic chest wall pain but is more accepting of this now. We will continue with her aspirin and metoprolol.

## 2011-05-28 NOTE — Assessment & Plan Note (Signed)
She is only on aspirin 81 mg daily. She is no longer on prednisone. I agree with the use of support hose.

## 2011-05-28 NOTE — Progress Notes (Signed)
[name] Date of Birth: 12-04-1934 Medical Record #[id number]  History of Present Illness: Renee [name] is seen back today for a follow up visit. Her major complaint today is of some discolored spots in her lower extremities. These occur mostly in the areas of pressure. She was seen by dermatology and it was recommended that she wear support hose. She has had no increase in swelling or erythema. She denies any increased chest pain. She's had no shortness of breath. She reports that her last TSH was quite low and her thyroid was stopped completely.    Current Outpatient Prescriptions on File Prior to Visit  Medication Sig Dispense Refill  . aspirin EC 81 MG tablet Take 81 mg by mouth daily.        . diazepam (VALIUM) 5 MG tablet as needed.      [name] esomeprazole (NEXIUM) 40 MG capsule Take 40 mg by mouth daily before breakfast.        . fish oil-omega-3 fatty acids 1000 MG capsule Take 1 g by mouth daily.       . furosemide (LASIX) 40 MG tablet TAKE 1 TABLET BY MOUTH TWICE DAILY  60 tablet  5  . metoprolol tartrate (LOPRESSOR) 25 MG tablet TAKE 1 TABLET BY MOUTH TWICE DAILY  60 tablet  4  . nitroGLYCERIN (NITROSTAT) 0.4 MG SL tablet Place 1 tablet (0.4 mg total) under the tongue every 5 (five) minutes as needed for chest pain.  25 tablet  12  . Pitavastatin Calcium (LIVALO) 2 MG TABS Take 1 mg by mouth at bedtime.       Bertram Gala Glycol-Propyl Glycol (SYSTANE OP) Apply 1 drop to eye daily.        . potassium chloride SA (K-DUR,KLOR-CON) 20 MEQ tablet Take 20 mEq by mouth 2 (two) times daily.        [name] tiotropium (SPIRIVA HANDIHALER) 18 MCG inhalation capsule Place 1 capsule (18 mcg total) into inhaler and inhale daily.  30 capsule  6  . traMADol (ULTRAM) 50 MG tablet Take 1 tablet (50 mg total) by mouth every 6 (six) hours as needed. Pain  30 tablet  0  . VITAMIN D, CHOLECALCIFEROL, PO Take 1 tablet by mouth daily.        [name] DISCONTD: budesonide-formoterol (SYMBICORT) 160-4.5 MCG/ACT inhaler  Inhale 2 puffs into the lungs 2 (two) times daily.  1 Inhaler  12  . DISCONTD: calcium citrate-vitamin D (CITRACAL+D) 315-200 MG-UNIT per tablet Take 1 tablet by mouth 2 (two) times daily.          Allergies  Allergen Reactions  . Levaquin Nausea Only    Unable to tolerate more than 7days  . Morphine     REACTION: double vision  . Penicillins     REACTION: swelling/rash  . Phenazopyridine Hcl     *piridium* REACTION: face red  . Pneumococcal Vaccine     REACTION: rash and cough  . Pneumococcal Vaccines Swelling  . Prednisone     REACTION: rash  . Prednisone Swelling  . Sulfonamide Derivatives     REACTION: rash  . Symbicort     Tachycardia  . Tylenol (Acetaminophen)     Tylenol #3 -- rash and swelling  . Penicillins Swelling and Rash  . Sulfa Antibiotics Rash    Past Medical History  Diagnosis Date  . GERD (gastroesophageal reflux disease)   . IBS (irritable bowel syndrome)   . Bundle branch block, right   . Coronary artery disease  prior CABG in August of 2011  . Emphysema/COPD   . Fatigue   . SOB (shortness of breath)   . Bruises easily   . Joint pain   . Weakness   . Anxiety   . Pain on voiding   . AF (atrial fibrillation)     Post op after CABG; off amiodarone  . Fracture of toe of left foot   . HTN (hypertension)   . CHF (congestive heart failure)   . Dysphagia   . Colon polyps   . Hypertension   . Hypercholesteremia   . Normal nuclear stress test October 2012    Past Surgical History  Procedure Date  . Coronary artery bypass graft   . Total hip arthroplasty 11/01/2008    right  . Appendectomy 1995  . Cholecystectomy 2008    LARYNX  . Tonsillectomy and adenoidectomy   . Total abdominal hysterectomy 1986  . Cardiac surgery     History  Smoking status  . Former Smoker -- 1.0 packs/day for 50 years  . Types: Cigarettes  . Quit date: 09/21/2009  Smokeless tobacco  . Not on file    History  Alcohol Use No    Family History  Problem  Relation Age of Onset  . Heart disease Mother   . Transient ischemic attack Mother   . Heart attack Father   . Hypertension Father   . Diabetes Father   . Breast cancer Sister   . Other Sister     brain tumor  . Colon polyps Brother   . Colon cancer Neg Hx     Review of Systems: The review of systems is positive for recent fracture of her left hand when she was using a plunger..  All other systems were reviewed and are negative.  Physical Exam: BP 134/70  Pulse 74  Ht 5\' 6"  (1.676 m)  Wt 153 lb (69.4 kg)  BMI 24.69 kg/m2 Patient is very pleasant and in no acute distress.  Skin is warm and dry. Color is normal.  HEENT is unremarkable. Normocephalic/atraumatic. PERRL. Sclera are nonicteric. Neck is supple. No masses. No JVD. Lungs are clear. Cardiac exam shows a regular rate and rhythm. Sternum remains  tender to touch. Abdomen is soft. Extremities are without edema. She does have some stasis dermatitis as well as areas of bruising. Gait and ROM are intact. No gross neurologic deficits noted.   LABORATORY DATA:   Assessment / Plan:

## 2011-05-28 NOTE — Patient Instructions (Signed)
Continue your current medication.  Wear support stockings  I will see you again in 6 months.

## 2011-06-04 DIAGNOSIS — S62309A Unspecified fracture of unspecified metacarpal bone, initial encounter for closed fracture: Secondary | ICD-10-CM | POA: Diagnosis not present

## 2011-06-06 ENCOUNTER — Telehealth: Payer: Self-pay | Admitting: Cardiology

## 2011-06-06 NOTE — Telephone Encounter (Signed)
Will forward to Fernand Parkins. Nurse for Dr [place] since this is a Dr [place] patient

## 2011-06-06 NOTE — Telephone Encounter (Signed)
Patient called, stated she had a episode of fast heart beat last night 155 beats/min.Stated lasted appox 1 hour.States took a valium 2.5 mg and that made it worse.Spoke with Dr.Jordan he advised may take a extra metoprolol 25 mg if needed.Advised to call back if needed.

## 2011-06-06 NOTE — Telephone Encounter (Signed)
Pt had a bad case of tachicardia last night and wants to talk to you about it

## 2011-06-07 DIAGNOSIS — E538 Deficiency of other specified B group vitamins: Secondary | ICD-10-CM | POA: Diagnosis not present

## 2011-06-14 DIAGNOSIS — Z1231 Encounter for screening mammogram for malignant neoplasm of breast: Secondary | ICD-10-CM | POA: Diagnosis not present

## 2011-06-18 DIAGNOSIS — J0100 Acute maxillary sinusitis, unspecified: Secondary | ICD-10-CM | POA: Diagnosis not present

## 2011-06-18 DIAGNOSIS — J449 Chronic obstructive pulmonary disease, unspecified: Secondary | ICD-10-CM | POA: Diagnosis not present

## 2011-06-18 DIAGNOSIS — I872 Venous insufficiency (chronic) (peripheral): Secondary | ICD-10-CM | POA: Diagnosis not present

## 2011-06-18 DIAGNOSIS — E039 Hypothyroidism, unspecified: Secondary | ICD-10-CM | POA: Diagnosis not present

## 2011-06-22 DIAGNOSIS — M25519 Pain in unspecified shoulder: Secondary | ICD-10-CM | POA: Diagnosis not present

## 2011-06-22 DIAGNOSIS — S62309A Unspecified fracture of unspecified metacarpal bone, initial encounter for closed fracture: Secondary | ICD-10-CM | POA: Diagnosis not present

## 2011-06-22 DIAGNOSIS — M542 Cervicalgia: Secondary | ICD-10-CM | POA: Diagnosis not present

## 2011-07-13 DIAGNOSIS — H35379 Puckering of macula, unspecified eye: Secondary | ICD-10-CM | POA: Diagnosis not present

## 2011-07-20 ENCOUNTER — Encounter: Payer: Medicare Other | Admitting: Vascular Surgery

## 2011-08-03 ENCOUNTER — Encounter: Payer: Self-pay | Admitting: Surgery

## 2011-08-06 ENCOUNTER — Encounter: Payer: Self-pay | Admitting: Surgery

## 2011-08-06 ENCOUNTER — Ambulatory Visit (INDEPENDENT_AMBULATORY_CARE_PROVIDER_SITE_OTHER): Payer: Medicare Other | Admitting: Surgery

## 2011-08-06 ENCOUNTER — Ambulatory Visit (INDEPENDENT_AMBULATORY_CARE_PROVIDER_SITE_OTHER): Payer: Medicare Other | Admitting: Vascular Surgery

## 2011-08-06 VITALS — BP 118/69 | HR 62 | Resp 16 | Ht 66.0 in | Wt 156.4 lb

## 2011-08-06 DIAGNOSIS — E785 Hyperlipidemia, unspecified: Secondary | ICD-10-CM | POA: Diagnosis not present

## 2011-08-06 DIAGNOSIS — R609 Edema, unspecified: Secondary | ICD-10-CM

## 2011-08-06 DIAGNOSIS — I83893 Varicose veins of bilateral lower extremities with other complications: Secondary | ICD-10-CM

## 2011-08-06 DIAGNOSIS — E039 Hypothyroidism, unspecified: Secondary | ICD-10-CM | POA: Diagnosis not present

## 2011-08-06 DIAGNOSIS — M79609 Pain in unspecified limb: Secondary | ICD-10-CM | POA: Diagnosis not present

## 2011-08-06 NOTE — Progress Notes (Signed)
[facility]   Patient name: Renee [name] MRN: [id number] DOB: 05/22/2004 Sex: female   Referred by: Dr. Clelia Croft  Reason for referral:  Chief Complaint  Patient presents with  . New Evaluation    Varicose Veins,edema bilateral,tried/failed support stocings    HISTORY OF PRESENT ILLNESS: Patient comes in today for evaluation of bilateral leg pain and discoloration. She states that for the past 6 months she has noticed a progressive discoloration of her legs. She describes having new splotches on each of her legs particularly on the posterior side of the right leg and on the medial side of the left leg at the level of the knee. She had her right greater saphenous vein harvested for cardiac surgery 2 years ago. It was at that time that she began having problems in the right leg. She states that her swelling is worse in the right leg than the left but has problems with both. She does experience pain which he has swelling. Her swelling is worse at the end of a long day. She denies symptoms that are consistent with claudication. She does not have any muscle aching or cramping with exercise. She denies varicosities.  Patient has a history of coronary artery disease having undergone CABG 2 years ago. She is medically managed for her hypertension. She has mild COPD with a history of tobacco in the past.  Past Medical History  Diagnosis Date  . GERD (gastroesophageal reflux disease)   . IBS (irritable bowel syndrome)   . Bundle branch block, right   . Coronary artery disease     prior CABG in August of 2011  . Emphysema/COPD   . Fatigue   . SOB (shortness of breath)   . Bruises easily   . Joint pain   . Weakness   . Anxiety   . Pain on voiding   . AF (atrial fibrillation)     Post op after CABG; off amiodarone  . Fracture of toe of left foot   . HTN (hypertension)   . CHF (congestive heart failure)   . Dysphagia   . Colon polyps   . Hypertension   .  Hypercholesteremia   . Normal nuclear stress test October 2012  . Myocardial infarction     Past Surgical History  Procedure Date  . Coronary artery bypass graft   . Total hip arthroplasty 11/01/2008    right  . Appendectomy 1995  . Cholecystectomy 2008    LARYNX  . Tonsillectomy and adenoidectomy   . Total abdominal hysterectomy 1986  . Cardiac surgery   . Joint replacement     Right Hip 2010    History   Social History  . Marital Status: Married    Spouse Name: N/A    Number of Children: 1  . Years of Education: N/A   Occupational History  . nurse     retired   Social History Main Topics  . Smoking status: Former Smoker -- 1.0 packs/day for 50 years    Types: Cigarettes    Quit date: 09/21/2009  . Smokeless tobacco: Not on file  . Alcohol Use: No  . Drug Use: No  . Sexually Active:    Other Topics Concern  . Not on file   Social History Narrative   ** Merged History Encounter ** Daily caffeine use: Dr Reino Kent and 2 cups coffee per day    Family History  Problem Relation Age of Onset  . Heart disease Mother   .  Transient ischemic attack Mother   . Hypertension Mother   . Deep vein thrombosis Mother   . Heart attack Father   . Hypertension Father   . Diabetes Father   . Heart disease Father     Heart disease before age 39  . Hyperlipidemia Father   . Breast cancer Sister   . Cancer Sister   . Other Sister     brain tumor  . Colon polyps Brother   . Hypertension Brother   . Colon cancer Neg Hx     Allergies as of 08/06/2011 - Review Complete 08/06/2011  Allergen Reaction Noted  . Statins  08/06/2011  . Levofloxacin Nausea Only 01/03/2011  . Budesonide-formoterol fumarate  04/30/2011  . Morphine  02/26/2008  . Penicillins  02/26/2008  . Phenazopyridine hcl  02/26/2008  . Pneumococcal vaccine    . Pneumococcal vaccines Swelling 12/24/2010  . Prednisone    . Prednisone Swelling 12/24/2010  . Sulfonamide derivatives  02/26/2008  . Tylenol  (acetaminophen)  08/14/2010  . Penicillins Swelling and Rash 12/24/2010  . Sulfa antibiotics Rash 12/24/2010    Current Outpatient Prescriptions on File Prior to Visit  Medication Sig Dispense Refill  . aspirin EC 81 MG tablet Take 81 mg by mouth daily.        . diazepam (VALIUM) 5 MG tablet as needed.      [name] esomeprazole (NEXIUM) 40 MG capsule Take 40 mg by mouth daily before breakfast.        . fish oil-omega-3 fatty acids 1000 MG capsule Take 1 g by mouth daily.       . furosemide (LASIX) 40 MG tablet TAKE 1 TABLET BY MOUTH TWICE DAILY  60 tablet  5  . metoprolol tartrate (LOPRESSOR) 25 MG tablet TAKE 1 TABLET BY MOUTH TWICE DAILY  60 tablet  4  . nitroGLYCERIN (NITROSTAT) 0.4 MG SL tablet Place 1 tablet (0.4 mg total) under the tongue every 5 (five) minutes as needed for chest pain.  25 tablet  12  . NON FORMULARY as needed. Oxygen      . Polyethyl Glycol-Propyl Glycol (SYSTANE OP) Apply 1 drop to eye daily.        . potassium chloride SA (K-DUR,KLOR-CON) 20 MEQ tablet Take 20 mEq by mouth 2 (two) times daily.        [name] tiotropium (SPIRIVA HANDIHALER) 18 MCG inhalation capsule Place 1 capsule (18 mcg total) into inhaler and inhale daily.  30 capsule  6  . traMADol (ULTRAM) 50 MG tablet Take 1 tablet (50 mg total) by mouth every 6 (six) hours as needed. Pain  30 tablet  0  . VITAMIN D, CHOLECALCIFEROL, PO Take 1 tablet by mouth daily.        . Pitavastatin Calcium (LIVALO) 2 MG TABS Take 1 mg by mouth at bedtime.       [name] DISCONTD: budesonide-formoterol (SYMBICORT) 160-4.5 MCG/ACT inhaler Inhale 2 puffs into the lungs 2 (two) times daily.  1 Inhaler  12  . DISCONTD: calcium citrate-vitamin D (CITRACAL+D) 315-200 MG-UNIT per tablet Take 1 tablet by mouth 2 (two) times daily.           REVIEW OF SYSTEMS: Positive for pain in her feet when lying flat, swelling of legs and varicose veins. She describes unusual edema pockets. Neuro: Cerebral aneurysm which is 75 years old Pulmonary: COPD on  when necessary oxygen Cardiovascular: History of MI, status post CABG, history of CHF. All other systems negative as documented by the patient  and the encounter form  PHYSICAL EXAMINATION: General: The patient appears 77 year oldr stated age.  Vital signs are BP 118/69  Pulse 62  Resp 16  Ht 5\' 6"  (1.676 m)  Wt 156 lb 6.4 oz (70.943 kg)  BMI 25.24 kg/m2  SpO2 98% HEENT:  No gross abnormalities Pulmonary: Respirations are non-labored Musculoskeletal: There are no major deformities.   Neurologic: No focal weakness or paresthesias are detected, Skin: There are no ulcer or rashes noted. Psychiatric: The patient has normal affect. Cardiovascular:  Palpable dorsalis pedis pulse bilaterally. 1+ edema bilaterally. Prominent spider veins bilaterally no prominent varicosities  Diagnostic Studies: Venous duplex was performed today which shows no significant deep system reflux. The left greater saphenous vein is without reflux. There is reflux in the left short saphenous vein    Assessment:  Bilateral leg swelling, right greater than left Plan: I believe the patient's swelling is multifactorial in etiology. With her right leg being the more symptomatic of the 2, she does not show any evidence of venous insufficiency. She only has venous insufficiency in the left short saphenous vein. Based on the findings ultrasound as well as with clinical examination which shows trace edema, telangiectasias in spider veins without significant varicosities, I would recommend conservative therapy at this time. The patient has tried knee-high stockings in the past but had issues with her circulation and swelling in the knee. For that reason, I would recommend thigh-high compression stockings. After giving her a prescription to take with her to fill at her convenience. She will followup on a when necessary basis     V. Charlena Cross, M.D. [facility] Office: [phone] Pager:   [phone]

## 2011-08-13 NOTE — Procedures (Unsigned)
LOWER EXTREMITY VENOUS REFLUX EXAM  INDICATION:  Lower extremity varicose veins, pain, edema.  EXAM:  Using color-flow imaging and pulse Doppler spectral analysis, the bilateral common femoral, femoral, popliteal, posterior tibial, greater and small saphenous veins were evaluated.  There is no evidence suggesting deep venous insufficiency in the bilateral lower extremities.  The bilateral saphenofemoral junctions are competent with Reflux of >556milliseconds. The right GSV has been harvested.  The left GSV is competent.  The bilateral proximal small saphenous veins demonstrate incompetency. The right small saphenous vein diameters range from 0.41 cm to 0.80 cm. The left small saphenous vein diameters range from 0.33 cm to 0.36 cm.  GSV Diameter (used if found to be incompetent only)                                           Right    Left Proximal Greater Saphenous Vein           cm       cm Proximal-to-mid-thigh                     cm       cm Mid thigh                                 cm       cm Mid-distal thigh                          cm       cm Distal thigh                              cm       cm Knee                                      cm       cm  IMPRESSION: 1. The right greater saphenous vein is absent due to previous surgical     intervention. 2. The left greater saphenous vein is competent. 3. The bilateral great saphenous veins were visualized, are not     tortuous. 4. The deep venous system of the bilateral lower extremities is     competent. 5. The bilateral small saphenous veins are not competent with Reflux     of >524milliseconds.        ___________________________________________ V. Charlena Cross, MD  SH/MEDQ  D:  08/06/2011  T:  08/06/2011  Job:  [id number]

## 2011-08-15 ENCOUNTER — Encounter: Payer: Self-pay | Admitting: Critical Care Medicine

## 2011-08-15 ENCOUNTER — Other Ambulatory Visit: Payer: Self-pay

## 2011-08-15 ENCOUNTER — Other Ambulatory Visit: Payer: Self-pay | Admitting: *Deleted

## 2011-08-15 ENCOUNTER — Ambulatory Visit (INDEPENDENT_AMBULATORY_CARE_PROVIDER_SITE_OTHER): Payer: Medicare Other | Admitting: Critical Care Medicine

## 2011-08-15 VITALS — BP 138/76 | HR 64 | Temp 97.9°F | Ht 66.0 in | Wt 158.4 lb

## 2011-08-15 DIAGNOSIS — E039 Hypothyroidism, unspecified: Secondary | ICD-10-CM | POA: Insufficient documentation

## 2011-08-15 DIAGNOSIS — J449 Chronic obstructive pulmonary disease, unspecified: Secondary | ICD-10-CM

## 2011-08-15 MED ORDER — ACLIDINIUM BROMIDE 400 MCG/ACT IN AEPB: 1.0000 | INHALATION_SPRAY | Freq: Two times a day (BID) | RESPIRATORY_TRACT | Status: DC

## 2011-08-15 NOTE — Patient Instructions (Addendum)
We will try to get oxygen account switched to [facility] We will start Tudorza one puff twice daily, use sample first to make sure you can tolerate then fill with coupon Stop spiriva  Return 4 months

## 2011-08-15 NOTE — Progress Notes (Signed)
Subjective:    Patient ID: Renee [name], female    DOB: May 19, 1934, 77 y.o.   MRN: [id number]  HPI  77 y.o.   female with known hx of COPD -former smoker -Gold 3    08/15/2011 Problems with the heat.  Pt with cough and sneeze, cough is dry.  Dyspneic with exertion. No real chest pain.   Pt denies any significant sore throat, nasal congestion or excess secretions, fever, chills, sweats, unintended weight loss, pleurtic or exertional chest pain, orthopnea PND, or leg swelling Pt denies any increase in rescue therapy over baseline, denies waking up needing it or having any early am or nocturnal exacerbations of coughing/wheezing/or dyspnea. Pt also denies any obvious fluctuation in symptoms with  weather or environmental change or other alleviating or aggravating factors     Past Medical History  Diagnosis Date  . GERD (gastroesophageal reflux disease)   . IBS (irritable bowel syndrome)   . Bundle branch block, right   . Coronary artery disease     prior CABG in August of 2011  . Emphysema/COPD   . Fatigue   . SOB (shortness of breath)   . Bruises easily   . Joint pain   . Weakness   . Anxiety   . Pain on voiding   . AF (atrial fibrillation)     Post op after CABG; off amiodarone  . Fracture of toe of left foot   . HTN (hypertension)   . CHF (congestive heart failure)   . Dysphagia   . Colon polyps   . Hypertension   . Hypercholesteremia   . Normal nuclear stress test October 2012  . Myocardial infarction   . Hypothyroidism      Family History  Problem Relation Age of Onset  . Heart disease Mother   . Transient ischemic attack Mother   . Hypertension Mother   . Deep vein thrombosis Mother   . Heart attack Father   . Hypertension Father   . Diabetes Father   . Heart disease Father     Heart disease before age 35  . Hyperlipidemia Father   . Breast cancer Sister   . Cancer Sister   . Other Sister     brain tumor  . Colon polyps Brother   . Hypertension  Brother   . Colon cancer Neg Hx      History   Social History  . Marital Status: Married    Spouse Name: N/A    Number of Children: 1  . Years of Education: N/A   Occupational History  . nurse     retired   Social History Main Topics  . Smoking status: Former Smoker -- 1.0 packs/day for 50 years    Types: Cigarettes    Quit date: 09/21/2009  . Smokeless tobacco: Never Used  . Alcohol Use: No  . Drug Use: No  . Sexually Active: Not on file   Other Topics Concern  . Not on file   Social History Narrative   ** Merged History Encounter ** Daily caffeine use: Dr Reino Kent and 2 cups coffee per day     Allergies  Allergen Reactions  . Statins     Legs hurt  . Levofloxacin Nausea Only    Unable to tolerate more than 7days  . Budesonide-Formoterol Fumarate     Tachycardia  . Morphine     REACTION: double vision  . Penicillins     REACTION: swelling/rash  . Phenazopyridine Hcl     *  piridium* REACTION: face red  . Pneumococcal Vaccine     REACTION: rash and cough  . Pneumococcal Vaccines Swelling  . Prednisone     REACTION: rash  . Prednisone Swelling  . Sulfonamide Derivatives     REACTION: rash  . Tylenol (Acetaminophen)     Tylenol #3 -- rash and swelling  . Penicillins Swelling and Rash  . Sulfa Antibiotics Rash     Outpatient Prescriptions Prior to Visit  Medication Sig Dispense Refill  . aspirin EC 81 MG tablet Take 81 mg by mouth daily.        . Cyanocobalamin (VITAMIN B 12) 100 MCG LOZG Inject into the skin every 30 (thirty) days.      . diazepam (VALIUM) 5 MG tablet as needed.      [name] esomeprazole (NEXIUM) 40 MG capsule Take 40 mg by mouth daily before breakfast.        . fish oil-omega-3 fatty acids 1000 MG capsule Take 1 g by mouth daily.       . furosemide (LASIX) 40 MG tablet TAKE 1 TABLET BY MOUTH TWICE DAILY  60 tablet  5  . metoprolol tartrate (LOPRESSOR) 25 MG tablet TAKE 1 TABLET BY MOUTH TWICE DAILY  60 tablet  4  . nitroGLYCERIN (NITROSTAT)  0.4 MG SL tablet Place 1 tablet (0.4 mg total) under the tongue every 5 (five) minutes as needed for chest pain.  25 tablet  12  . NON FORMULARY as needed. Oxygen      . Polyethyl Glycol-Propyl Glycol (SYSTANE OP) Apply 1 drop to eye daily.        . potassium chloride SA (K-DUR,KLOR-CON) 20 MEQ tablet Take 20 mEq by mouth 2 (two) times daily.        . traMADol (ULTRAM) 50 MG tablet Take 1 tablet (50 mg total) by mouth every 6 (six) hours as needed. Pain  30 tablet  0  . VITAMIN D, CHOLECALCIFEROL, PO Take 1 tablet by mouth daily.        [name] tiotropium (SPIRIVA HANDIHALER) 18 MCG inhalation capsule Place 1 capsule (18 mcg total) into inhaler and inhale daily.  30 capsule  6  . Pitavastatin Calcium (LIVALO) 2 MG TABS Take 1 mg by mouth at bedtime.          Review of Systems  Constitutional:   No  weight loss, night sweats,  Fevers, chills, ++ fatigue, or  lassitude.  HEENT:   No headaches,  Difficulty swallowing,  Tooth/dental problems, or  Sore throat,                No sneezing, itching, ear ache, nasal congestion, post nasal drip,   CV:   Orthopnea, PND, swelling in lower extremities, anasarca, dizziness, palpitations, syncope.   GI  No heartburn, indigestion, abdominal pain, nausea, vomiting, diarrhea, change in bowel habits, loss of appetite, bloody stools.   Resp:    No coughing up of blood.    No chest wall deformity  Skin: no rash or lesions.  GU: no dysuria, change in color of urine, no urgency or frequency.  No flank pain, no hematuria   MS:  No joint pain or swelling.  No decreased range of motion.   Psych:  No change in mood or affect. No depression or anxiety.            Objective:   Physical Exam BP 138/76  Pulse 64  Temp 97.9 F (36.6 C) (Oral)  Ht 5\' 6"  (1.676 m)  Wt 158 lb 6.4 oz (71.85 kg)  BMI 25.57 kg/m2  SpO2 97%  GEN: A/Ox3; pleasant , NAD, elderly  HEENT:  [place]/AT,  EACs-clear, TMs-wnl, NOSE-clear drainage THROAT-clear, no lesions, no postnasal drip  or exudate noted.   NECK:  Supple w/ fair ROM; no JVD; normal carotid impulses w/o bruits; no thyromegaly or nodules palpated; no lymphadenopathy.  RESP  Coarse BS w/ no wheezing no accessory muscle use, no dullness to percussion  CARD:  RRR, no m/r/g  , no peripheral edema, pulses intact, no cyanosis or clubbing.  GI:   Soft & nt; nml bowel sounds; no organomegaly or masses detected.  Musco: Warm bil, no deformities or joint swelling noted.   Neuro: alert, no focal deficits noted.    Skin: Warm, no lesions or rashes         Assessment & Plan:  COPD Gold stage C. COPD with intolerance to Spiriva and Symbicort Plan Maintain nocturnal oxygen We will try to get oxygen account switched to [facility] We will start Tudorza one puff twice daily, use sample first to make sure you can tolerate then fill with coupon Stop spiriva  Return 4 months        Updated Medication List Outpatient Encounter Prescriptions as of 08/15/2011  Medication Sig Dispense Refill  . aspirin EC 81 MG tablet Take 81 mg by mouth daily.        . ciprofloxacin (CIPRO) 500 MG tablet Take 1 tablet by mouth Twice daily.      . Cyanocobalamin (VITAMIN B 12) 100 MCG LOZG Inject into the skin every 30 (thirty) days.      . diazepam (VALIUM) 5 MG tablet as needed.      [name] esomeprazole (NEXIUM) 40 MG capsule Take 40 mg by mouth daily before breakfast.        . fish oil-omega-3 fatty acids 1000 MG capsule Take 1 g by mouth daily.       . furosemide (LASIX) 40 MG tablet TAKE 1 TABLET BY MOUTH TWICE DAILY  60 tablet  5  . levothyroxine (SYNTHROID, LEVOTHROID) 25 MCG tablet Take 1 tablet by mouth daily.      . metoprolol tartrate (LOPRESSOR) 25 MG tablet TAKE 1 TABLET BY MOUTH TWICE DAILY  60 tablet  4  . nitroGLYCERIN (NITROSTAT) 0.4 MG SL tablet Place 1 tablet (0.4 mg total) under the tongue every 5 (five) minutes as needed for chest pain.  25 tablet  12  . NON FORMULARY as needed. Oxygen      . Polyethyl Glycol-Propyl  Glycol (SYSTANE OP) Apply 1 drop to eye daily.        . potassium chloride SA (K-DUR,KLOR-CON) 20 MEQ tablet Take 20 mEq by mouth 2 (two) times daily.        . traMADol (ULTRAM) 50 MG tablet Take 1 tablet (50 mg total) by mouth every 6 (six) hours as needed. Pain  30 tablet  0  . VITAMIN D, CHOLECALCIFEROL, PO Take 1 tablet by mouth daily.        . Aclidinium Bromide (TUDORZA PRESSAIR) 400 MCG/ACT AEPB Inhale 1 puff into the lungs 2 (two) times daily.  1 each  6  . tiotropium (SPIRIVA HANDIHALER) 18 MCG inhalation capsule Place 1 capsule (18 mcg total) into inhaler and inhale daily.  30 capsule  6  . DISCONTD: Pitavastatin Calcium (LIVALO) 2 MG TABS Take 1 mg by mouth at bedtime.       [name] DISCONTD: tiotropium (SPIRIVA) 18 MCG inhalation  capsule Place 18 mcg into inhaler and inhale daily.

## 2011-08-16 ENCOUNTER — Telehealth: Payer: Self-pay | Admitting: Critical Care Medicine

## 2011-08-16 ENCOUNTER — Telehealth: Payer: Self-pay

## 2011-08-16 DIAGNOSIS — J449 Chronic obstructive pulmonary disease, unspecified: Secondary | ICD-10-CM

## 2011-08-16 MED ORDER — POTASSIUM CHLORIDE CRYS ER 20 MEQ PO TBCR: 20.0000 meq | EXTENDED_RELEASE_TABLET | Freq: Two times a day (BID) | ORAL | Status: DC

## 2011-08-16 NOTE — Telephone Encounter (Signed)
Looked in pt chart and do not see how much oxygen pt is to use with exertion. Dr. Delford Field is it okay to send order for 3L prn with exertion, thanks

## 2011-08-16 NOTE — Telephone Encounter (Signed)
Order has been sent

## 2011-08-16 NOTE — Assessment & Plan Note (Signed)
Gold stage C. COPD with intolerance to Spiriva and Symbicort Plan Maintain nocturnal oxygen We will try to get oxygen account switched to [facility] We will start Tudorza one puff twice daily, use sample first to make sure you can tolerate then fill with coupon Stop spiriva  Return 4 months

## 2011-08-16 NOTE — Telephone Encounter (Signed)
Spoke to patient was told received fax from [facility] to refill potassium.Dr.Jordan advises taking 20 meq twice daily.

## 2011-08-16 NOTE — Telephone Encounter (Signed)
Yes

## 2011-08-20 ENCOUNTER — Telehealth: Payer: Self-pay | Admitting: Critical Care Medicine

## 2011-08-20 DIAGNOSIS — J449 Chronic obstructive pulmonary disease, unspecified: Secondary | ICD-10-CM

## 2011-08-20 NOTE — Telephone Encounter (Signed)
Order was sent to have pt's o2 changed from [facility] to [facility].  Pt states [facility] has delivered o2 and would like [facility] to pick up the o2 from them.  [facility], can you advise [facility] of this?

## 2011-08-20 NOTE — Telephone Encounter (Signed)
Need an order to D/C oxygen with [facility]. Please send order to [facility] box. Thanks, Ollen Gross

## 2011-08-20 NOTE — Telephone Encounter (Signed)
Order sent to [facility] 

## 2011-09-03 ENCOUNTER — Telehealth: Payer: Self-pay | Admitting: Critical Care Medicine

## 2011-09-03 DIAGNOSIS — J449 Chronic obstructive pulmonary disease, unspecified: Secondary | ICD-10-CM

## 2011-09-03 MED ORDER — ACLIDINIUM BROMIDE 400 MCG/ACT IN AEPB: 1.0000 | INHALATION_SPRAY | Freq: Two times a day (BID) | RESPIRATORY_TRACT | Status: DC

## 2011-09-03 NOTE — Telephone Encounter (Signed)
Called and spoke with patient.  Patient informed me that she feels the Renee [name] is helping and would like Rx for it.  Patient stated that she was told at last OV that there was a Rx for the medication in her sample bag however she did not see one.  I informed her that I would call in rx to verified pharmacy for her pick.  Patient verbalized understanding and nothing further needed at this time.

## 2011-09-13 ENCOUNTER — Other Ambulatory Visit: Payer: Self-pay | Admitting: Neurology

## 2011-09-13 DIAGNOSIS — I671 Cerebral aneurysm, nonruptured: Secondary | ICD-10-CM

## 2011-09-13 DIAGNOSIS — R279 Unspecified lack of coordination: Secondary | ICD-10-CM

## 2011-09-13 DIAGNOSIS — R269 Unspecified abnormalities of gait and mobility: Secondary | ICD-10-CM

## 2011-09-13 DIAGNOSIS — I2510 Atherosclerotic heart disease of native coronary artery without angina pectoris: Secondary | ICD-10-CM

## 2011-09-24 ENCOUNTER — Other Ambulatory Visit: Payer: Self-pay | Admitting: Cardiology

## 2011-10-24 DIAGNOSIS — I10 Essential (primary) hypertension: Secondary | ICD-10-CM | POA: Diagnosis not present

## 2011-10-24 DIAGNOSIS — Z23 Encounter for immunization: Secondary | ICD-10-CM | POA: Diagnosis not present

## 2011-10-24 DIAGNOSIS — E785 Hyperlipidemia, unspecified: Secondary | ICD-10-CM | POA: Diagnosis not present

## 2011-10-24 DIAGNOSIS — R300 Dysuria: Secondary | ICD-10-CM | POA: Diagnosis not present

## 2011-10-24 DIAGNOSIS — E039 Hypothyroidism, unspecified: Secondary | ICD-10-CM | POA: Diagnosis not present

## 2011-10-24 DIAGNOSIS — J449 Chronic obstructive pulmonary disease, unspecified: Secondary | ICD-10-CM | POA: Diagnosis not present

## 2011-10-24 DIAGNOSIS — E538 Deficiency of other specified B group vitamins: Secondary | ICD-10-CM | POA: Diagnosis not present

## 2011-11-02 ENCOUNTER — Ambulatory Visit (INDEPENDENT_AMBULATORY_CARE_PROVIDER_SITE_OTHER): Payer: Medicare Other | Admitting: Critical Care Medicine

## 2011-11-02 ENCOUNTER — Encounter: Payer: Self-pay | Admitting: Critical Care Medicine

## 2011-11-02 VITALS — BP 122/80 | HR 97 | Temp 98.1°F | Ht 66.0 in | Wt 152.8 lb

## 2011-11-02 DIAGNOSIS — J449 Chronic obstructive pulmonary disease, unspecified: Secondary | ICD-10-CM | POA: Diagnosis not present

## 2011-11-02 DIAGNOSIS — J209 Acute bronchitis, unspecified: Secondary | ICD-10-CM | POA: Diagnosis not present

## 2011-11-02 DIAGNOSIS — J4489 Other specified chronic obstructive pulmonary disease: Secondary | ICD-10-CM

## 2011-11-02 MED ORDER — OMEGA-3 FATTY ACIDS 1000 MG PO CAPS: ORAL_CAPSULE | ORAL | Status: DC

## 2011-11-02 MED ORDER — MOMETASONE FUROATE 50 MCG/ACT NA SUSP: 2.0000 | Freq: Every day | NASAL | Status: DC

## 2011-11-02 MED ORDER — CEFDINIR 300 MG PO CAPS: 600.0000 mg | ORAL_CAPSULE | Freq: Every day | ORAL | Status: DC

## 2011-11-02 MED ORDER — METHYLPREDNISOLONE ACETATE 80 MG/ML IJ SUSP
120.0000 mg | Freq: Once | INTRAMUSCULAR | Status: AC
  Administered 2011-11-02: 120 mg via INTRAMUSCULAR

## 2011-11-02 MED ORDER — FLUTICASONE PROPIONATE 50 MCG/ACT NA SUSP: NASAL | Status: DC

## 2011-11-02 NOTE — Progress Notes (Signed)
Subjective:    Patient ID: Renee [name], female    DOB: May 30, 1934, 77 y.o.   MRN: [id number]  HPI  77 y.o.   female with known hx of COPD -former smoker -Gold 3    08/15/2011 Problems with the heat.  Pt with cough and sneeze, cough is dry.  Dyspneic with exertion. No real chest pain.   Pt denies any significant sore throat, nasal congestion or excess secretions, fever, chills, sweats, unintended weight loss, pleurtic or exertional chest pain, orthopnea PND, or leg swelling Pt denies any increase in rescue therapy over baseline, denies waking up needing it or having any early am or nocturnal exacerbations of coughing/wheezing/or dyspnea. Pt also denies any obvious fluctuation in symptoms with  weather or environmental change or other alleviating or aggravating factors  11/02/2011 Now worse over three days, much more dyspnea. Pt notes more pndrip and mucus. Rx Zpak per PCP.  Coughing up brown green and chest hurts.  No fever.  No wheeze. Dyspnea at rest and exert and will not sleep well and awaken to be dyspneic.     Past Medical History  Diagnosis Date  . GERD (gastroesophageal reflux disease)   . IBS (irritable bowel syndrome)   . Bundle branch block, right   . Coronary artery disease     prior CABG in August of 2011  . Emphysema/COPD   . Fatigue   . SOB (shortness of breath)   . Bruises easily   . Joint pain   . Weakness   . Anxiety   . Pain on voiding   . AF (atrial fibrillation)     Post op after CABG; off amiodarone  . Fracture of toe of left foot   . HTN (hypertension)   . CHF (congestive heart failure)   . Dysphagia   . Colon polyps   . Hypertension   . Hypercholesteremia   . Normal nuclear stress test October 2012  . Myocardial infarction   . Hypothyroidism      Family History  Problem Relation Age of Onset  . Heart disease Mother   . Transient ischemic attack Mother   . Hypertension Mother   . Deep vein thrombosis Mother   . Heart attack Father   .  Hypertension Father   . Diabetes Father   . Heart disease Father     Heart disease before age 49  . Hyperlipidemia Father   . Breast cancer Sister   . Cancer Sister   . Other Sister     brain tumor  . Colon polyps Brother   . Hypertension Brother   . Colon cancer Neg Hx      History   Social History  . Marital Status: Married    Spouse Name: N/A    Number of Children: 1  . Years of Education: N/A   Occupational History  . nurse     retired   Social History Main Topics  . Smoking status: Former Smoker -- 1.0 packs/day for 50 years    Types: Cigarettes    Quit date: 09/21/2009  . Smokeless tobacco: Never Used  . Alcohol Use: No  . Drug Use: No  . Sexually Active: Not on file   Other Topics Concern  . Not on file   Social History Narrative   ** Merged History Encounter ** Daily caffeine use: Dr Reino Kent and 2 cups coffee per day     Allergies  Allergen Reactions  . Statins     Legs hurt  .  Levofloxacin Nausea Only    Unable to tolerate more than 7days  . Budesonide-Formoterol Fumarate     Tachycardia  . Morphine     REACTION: double vision  . Penicillins     REACTION: swelling/rash  . Phenazopyridine Hcl     *piridium* REACTION: face red  . Pneumococcal Vaccine     REACTION: rash and cough  . Pneumococcal Vaccines Swelling  . Prednisone     REACTION: rash  . Prednisone Swelling  . Sulfonamide Derivatives     REACTION: rash  . Tylenol (Acetaminophen)     Tylenol #3 -- rash and swelling  . Penicillins Swelling and Rash  . Sulfa Antibiotics Rash     Outpatient Prescriptions Prior to Visit  Medication Sig Dispense Refill  . Aclidinium Bromide (TUDORZA PRESSAIR) 400 MCG/ACT AEPB Inhale 1 puff into the lungs 2 (two) times daily.  1 each  3  . aspirin EC 81 MG tablet Take 81 mg by mouth daily.        . Cyanocobalamin (VITAMIN B 12) 100 MCG LOZG Inject into the skin every 30 (thirty) days.      . diazepam (VALIUM) 5 MG tablet as needed.      [name]  esomeprazole (NEXIUM) 40 MG capsule Take 40 mg by mouth daily before breakfast.        . furosemide (LASIX) 40 MG tablet TAKE 1 TABLET BY MOUTH TWICE DAILY  60 tablet  5  . levothyroxine (SYNTHROID, LEVOTHROID) 25 MCG tablet Take 1 tablet by mouth daily.      . metoprolol tartrate (LOPRESSOR) 25 MG tablet TAKE 1 TABLET BY MOUTH TWICE DAILY  60 tablet  4  . nitroGLYCERIN (NITROSTAT) 0.4 MG SL tablet Place 1 tablet (0.4 mg total) under the tongue every 5 (five) minutes as needed for chest pain.  25 tablet  12  . NON FORMULARY as needed. Oxygen      . Polyethyl Glycol-Propyl Glycol (SYSTANE OP) Apply 1 drop to eye daily.        . potassium chloride SA (K-DUR,KLOR-CON) 20 MEQ tablet Take 1 tablet (20 mEq total) by mouth 2 (two) times daily.  60 tablet  6  . traMADol (ULTRAM) 50 MG tablet Take 1 tablet (50 mg total) by mouth every 6 (six) hours as needed. Pain  30 tablet  0  . VITAMIN D, CHOLECALCIFEROL, PO Take 1 tablet by mouth daily.        . fish oil-omega-3 fatty acids 1000 MG capsule Take 1 g by mouth daily.       . ciprofloxacin (CIPRO) 500 MG tablet Take 1 tablet by mouth Twice daily.      [name] tiotropium (SPIRIVA HANDIHALER) 18 MCG inhalation capsule Place 1 capsule (18 mcg total) into inhaler and inhale daily.  30 capsule  6   No facility-administered medications prior to visit.     Review of Systems  Constitutional:   No  weight loss, night sweats,  Fevers, chills, ++ fatigue, or  lassitude.  HEENT:   No headaches,  Difficulty swallowing,  Tooth/dental problems, or  Sore throat,                No sneezing, itching, ear ache, nasal congestion, post nasal drip,   CV:   Orthopnea, PND, swelling in lower extremities, anasarca, dizziness, palpitations, syncope.   GI  No heartburn, indigestion, abdominal pain, nausea, vomiting, diarrhea, change in bowel habits, loss of appetite, bloody stools.   Resp:    No coughing  up of blood.    No chest wall deformity  Skin: no rash or  lesions.  GU: no dysuria, change in color of urine, no urgency or frequency.  No flank pain, no hematuria   MS:  No joint pain or swelling.  No decreased range of motion.   Psych:  No change in mood or affect. No depression or anxiety.            Objective:   Physical Exam BP 122/80  Pulse 97  Temp 98.1 F (36.7 C) (Oral)  Ht 5\' 6"  (1.676 m)  Wt 152 lb 12.8 oz (69.31 kg)  BMI 24.66 kg/m2  SpO2 97%  GEN: A/Ox3; pleasant , NAD, elderly  HEENT:  [place]/AT,  EACs-clear, TMs-wnl, NOSE purulent  drainage THROAT postnasal drainage of purulent material  NECK:  Supple w/ fair ROM; no JVD; normal carotid impulses w/o bruits; no thyromegaly or nodules palpated; no lymphadenopathy.  RESP  Coarse BS w/ expired wheezing no accessory muscle use, no dullness to percussion  CARD:  RRR, no m/r/g  , no peripheral edema, pulses intact, no cyanosis or clubbing.  GI:   Soft & nt; nml bowel sounds; no organomegaly or masses detected.  Musco: Warm bil, no deformities or joint swelling noted.   Neuro: alert, no focal deficits noted.    Skin: Warm, no lesions or rashes         Assessment & Plan:  COPD Gold stage C. COPD with acute flare due to acute sinusitis Plan Stop flonase while on nasonex, then resume  Start nasonex two puff each nostril daily A depomedrol 120mg  IM injection will be given Take Cefdinir 600mg  daily for 10days Stop fish oil Advise stop valium Return 2 weeks for recheck        Updated Medication List Outpatient Encounter Prescriptions as of 11/02/2011  Medication Sig Dispense Refill  . Aclidinium Bromide (TUDORZA PRESSAIR) 400 MCG/ACT AEPB Inhale 1 puff into the lungs 2 (two) times daily.  1 each  3  . aspirin EC 81 MG tablet Take 81 mg by mouth daily.        . Cyanocobalamin (VITAMIN B 12) 100 MCG LOZG Inject into the skin every 30 (thirty) days.      . diazepam (VALIUM) 5 MG tablet as needed.      [name] esomeprazole (NEXIUM) 40 MG capsule Take 40 mg by mouth  daily before breakfast.        . fish oil-omega-3 fatty acids 1000 MG capsule HOLD      . fluticasone (FLONASE) 50 MCG/ACT nasal spray HOLD until samples gone of nasonex, then resume      . furosemide (LASIX) 40 MG tablet TAKE 1 TABLET BY MOUTH TWICE DAILY  60 tablet  5  . levothyroxine (SYNTHROID, LEVOTHROID) 25 MCG tablet Take 1 tablet by mouth daily.      . metoprolol tartrate (LOPRESSOR) 25 MG tablet TAKE 1 TABLET BY MOUTH TWICE DAILY  60 tablet  4  . nitroGLYCERIN (NITROSTAT) 0.4 MG SL tablet Place 1 tablet (0.4 mg total) under the tongue every 5 (five) minutes as needed for chest pain.  25 tablet  12  . NON FORMULARY as needed. Oxygen      . Polyethyl Glycol-Propyl Glycol (SYSTANE OP) Apply 1 drop to eye daily.        . potassium chloride SA (K-DUR,KLOR-CON) 20 MEQ tablet Take 1 tablet (20 mEq total) by mouth 2 (two) times daily.  60 tablet  6  . PROAIR  HFA 108 (90 BASE) MCG/ACT inhaler Inhale 2 puffs into the lungs Every 6 hours as needed.      . traMADol (ULTRAM) 50 MG tablet Take 1 tablet (50 mg total) by mouth every 6 (six) hours as needed. Pain  30 tablet  0  . VITAMIN D, CHOLECALCIFEROL, PO Take 1 tablet by mouth daily.        [name] DISCONTD: fish oil-omega-3 fatty acids 1000 MG capsule Take 1 g by mouth daily.       [name] DISCONTD: fluticasone (FLONASE) 50 MCG/ACT nasal spray Place 2 sprays into the nose daily.      . cefdinir (OMNICEF) 300 MG capsule Take 2 capsules (600 mg total) by mouth daily.  20 capsule  0  . mometasone (NASONEX) 50 MCG/ACT nasal spray Place 2 sprays into the nose daily.  17 g  6  . DISCONTD: ciprofloxacin (CIPRO) 500 MG tablet Take 1 tablet by mouth Twice daily.      [name] DISCONTD: tiotropium (SPIRIVA HANDIHALER) 18 MCG inhalation capsule Place 1 capsule (18 mcg total) into inhaler and inhale daily.  30 capsule  6   Facility-Administered Encounter Medications as of 11/02/2011  Medication Dose Route Frequency Provider Last Rate Last Dose  . methylPREDNISolone acetate  (DEPO-MEDROL) injection 120 mg  120 mg Intramuscular Once Storm Frisk, MD   120 mg at 11/02/11 1445

## 2011-11-02 NOTE — Patient Instructions (Addendum)
Stop flonase while on nasonex, then resume  Start nasonex two puff each nostril daily A depomedrol 120mg  IM injection will be given Take Cefdinir 600mg  daily for 10days Stop fish oil Advise stop valium Return 2 weeks for recheck

## 2011-11-02 NOTE — Assessment & Plan Note (Signed)
Gold stage C. COPD with acute flare due to acute sinusitis Plan Stop flonase while on nasonex, then resume  Start nasonex two puff each nostril daily A depomedrol 120mg  IM injection will be given Take Cefdinir 600mg  daily for 10days Stop fish oil Advise stop valium Return 2 weeks for recheck

## 2011-11-05 ENCOUNTER — Ambulatory Visit (HOSPITAL_COMMUNITY)
Admission: RE | Admit: 2011-11-05 | Discharge: 2011-11-05 | Disposition: A | Discharge status: Home / Self Care | Payer: Medicare Other | Source: Ambulatory Visit | Attending: Critical Care Medicine | Admitting: Critical Care Medicine

## 2011-11-05 DIAGNOSIS — J209 Acute bronchitis, unspecified: Secondary | ICD-10-CM | POA: Insufficient documentation

## 2011-11-05 DIAGNOSIS — J449 Chronic obstructive pulmonary disease, unspecified: Secondary | ICD-10-CM | POA: Diagnosis not present

## 2011-11-05 NOTE — Progress Notes (Signed)
Quick Note:  Notify the patient that the Xray is stable and no pneumonia No change in medications are recommended. Continue current meds as prescribed at last office visit ______ 

## 2011-11-06 NOTE — Progress Notes (Signed)
Quick Note:  Called, spoke with pt. Informed her of cxr results and recs per Dr. Wright. She verbalized understanding. ______ 

## 2011-11-15 ENCOUNTER — Ambulatory Visit: Payer: Medicare Other | Admitting: Nurse Practitioner

## 2011-11-20 ENCOUNTER — Ambulatory Visit
Admission: RE | Admit: 2011-11-20 | Discharge: 2011-11-20 | Disposition: A | Discharge status: Home / Self Care | Payer: Medicare Other | Source: Ambulatory Visit | Attending: Neurology | Admitting: Neurology

## 2011-11-20 DIAGNOSIS — I671 Cerebral aneurysm, nonruptured: Secondary | ICD-10-CM | POA: Diagnosis not present

## 2011-11-20 DIAGNOSIS — R269 Unspecified abnormalities of gait and mobility: Secondary | ICD-10-CM | POA: Diagnosis not present

## 2011-11-20 DIAGNOSIS — R279 Unspecified lack of coordination: Secondary | ICD-10-CM

## 2011-11-20 DIAGNOSIS — I2510 Atherosclerotic heart disease of native coronary artery without angina pectoris: Secondary | ICD-10-CM

## 2011-11-21 ENCOUNTER — Encounter: Payer: Self-pay | Admitting: Critical Care Medicine

## 2011-11-21 ENCOUNTER — Ambulatory Visit (INDEPENDENT_AMBULATORY_CARE_PROVIDER_SITE_OTHER): Payer: Medicare Other | Admitting: Critical Care Medicine

## 2011-11-21 VITALS — BP 130/80 | HR 71 | Temp 98.3°F | Ht 66.0 in | Wt 154.4 lb

## 2011-11-21 DIAGNOSIS — J449 Chronic obstructive pulmonary disease, unspecified: Secondary | ICD-10-CM

## 2011-11-21 DIAGNOSIS — J4489 Other specified chronic obstructive pulmonary disease: Secondary | ICD-10-CM

## 2011-11-21 MED ORDER — CEFDINIR 300 MG PO CAPS: 600.0000 mg | ORAL_CAPSULE | Freq: Every day | ORAL | Status: DC

## 2011-11-21 NOTE — Progress Notes (Signed)
Subjective:    Patient ID: Renee [name], female    DOB: [birth year], 77 y.o.   MRN: [id number]  HPI  77 y.o.   female with known hx of COPD -former smoker -Gold 3    08/15/2011 Problems with the heat.  Pt with cough and sneeze, cough is dry.  Dyspneic with exertion. No real chest pain.   Pt denies any significant sore throat, nasal congestion or excess secretions, fever, chills, sweats, unintended weight loss, pleurtic or exertional chest pain, orthopnea PND, or leg swelling Pt denies any increase in rescue therapy over baseline, denies waking up needing it or having any early am or nocturnal exacerbations of coughing/wheezing/or dyspnea. Pt also denies any obvious fluctuation in symptoms with  weather or environmental change or other alleviating or aggravating factors  11/02/2011 Now worse over three days, much more dyspnea. Pt notes more pndrip and mucus. Rx Zpak per PCP.  Coughing up brown green and chest hurts.  No fever.  No wheeze. Dyspnea at rest and exert and will not sleep well and awaken to be dyspneic.  11/21/2011 At last ov we dx sinusitis and Rx Stop flonase while on nasonex, then resume  Start nasonex two puff each nostril daily A depomedrol 120mg  IM injection will be given Take Cefdinir 600mg  daily for 10days Stop fish oil Advise stop valium Pt is better with less cough and wheezing. Still producing green mucus. No chest pain.  No fever.  No wheeze.  No qhs dyspnea. Using oxygen at night Pt denies any significant sore throat, nasal congestion or excess secretions, fever, chills, sweats, unintended weight loss, pleurtic or exertional chest pain, orthopnea PND, or leg swelling Pt denies any increase in rescue therapy over baseline, denies waking up needing it or having any early am or nocturnal exacerbations of coughing/wheezing/or dyspnea. Pt also denies any obvious fluctuation in symptoms with  weather or environmental change or other alleviating or aggravating  factors      Past Medical History  Diagnosis Date  . GERD (gastroesophageal reflux disease)   . IBS (irritable bowel syndrome)   . Bundle branch block, right   . Coronary artery disease     prior CABG in August of 2011  . Emphysema/COPD   . Fatigue   . SOB (shortness of breath)   . Bruises easily   . Joint pain   . Weakness   . Anxiety   . Pain on voiding   . AF (atrial fibrillation)     Post op after CABG; off amiodarone  . Fracture of toe of left foot   . HTN (hypertension)   . CHF (congestive heart failure)   . Dysphagia   . Colon polyps   . Hypertension   . Hypercholesteremia   . Normal nuclear stress test October 2012  . Myocardial infarction   . Hypothyroidism      Family History  Problem Relation Age of Onset  . Heart disease Mother   . Transient ischemic attack Mother   . Hypertension Mother   . Deep vein thrombosis Mother   . Heart attack Father   . Hypertension Father   . Diabetes Father   . Heart disease Father     Heart disease before age 68  . Hyperlipidemia Father   . Breast cancer Sister   . Cancer Sister   . Other Sister     brain tumor  . Colon polyps Brother   . Hypertension Brother   . Colon cancer Neg Hx  History   Social History  . Marital Status: Married    Spouse Name: N/A    Number of Children: 1  . Years of Education: N/A   Occupational History  . nurse     retired   Social History Main Topics  . Smoking status: Former Smoker -- 1.0 packs/day for 50 years    Types: Cigarettes    Quit date: 09/21/2009  . Smokeless tobacco: Never Used  . Alcohol Use: No  . Drug Use: No  . Sexually Active: Not on file   Other Topics Concern  . Not on file   Social History Narrative   ** Merged History Encounter ** Daily caffeine use: Dr Reino Kent and 2 cups coffee per day     Allergies  Allergen Reactions  . Statins     Legs hurt  . Levofloxacin Nausea Only    Unable to tolerate more than 7days  . Budesonide-Formoterol  Fumarate     Tachycardia  . Morphine     REACTION: double vision  . Penicillins     REACTION: swelling/rash  . Phenazopyridine Hcl     *piridium* REACTION: face red  . Pneumococcal Vaccine     REACTION: rash and cough  . Pneumococcal Vaccines Swelling  . Prednisone     REACTION: rash  . Prednisone Swelling  . Sulfonamide Derivatives     REACTION: rash  . Tylenol (Acetaminophen)     Tylenol #3 -- rash and swelling  . Penicillins Swelling and Rash  . Sulfa Antibiotics Rash     Outpatient Prescriptions Prior to Visit  Medication Sig Dispense Refill  . Aclidinium Bromide (TUDORZA PRESSAIR) 400 MCG/ACT AEPB Inhale 1 puff into the lungs 2 (two) times daily.  1 each  3  . aspirin EC 81 MG tablet Take 81 mg by mouth daily.        . Cyanocobalamin (VITAMIN B 12) 100 MCG LOZG Inject into the skin every 30 (thirty) days.      [name] esomeprazole (NEXIUM) 40 MG capsule Take 40 mg by mouth daily before breakfast.        . furosemide (LASIX) 40 MG tablet TAKE 1 TABLET BY MOUTH TWICE DAILY  60 tablet  5  . levothyroxine (SYNTHROID, LEVOTHROID) 25 MCG tablet Take 1 tablet by mouth daily.      . metoprolol tartrate (LOPRESSOR) 25 MG tablet TAKE 1 TABLET BY MOUTH TWICE DAILY  60 tablet  4  . nitroGLYCERIN (NITROSTAT) 0.4 MG SL tablet Place 1 tablet (0.4 mg total) under the tongue every 5 (five) minutes as needed for chest pain.  25 tablet  12  . NON FORMULARY as needed. Oxygen      . Polyethyl Glycol-Propyl Glycol (SYSTANE OP) Apply 1 drop to eye daily.        . potassium chloride SA (K-DUR,KLOR-CON) 20 MEQ tablet Take 1 tablet (20 mEq total) by mouth 2 (two) times daily.  60 tablet  6  . PROAIR HFA 108 (90 BASE) MCG/ACT inhaler Inhale 2 puffs into the lungs Every 6 hours as needed.      . traMADol (ULTRAM) 50 MG tablet Take 1 tablet (50 mg total) by mouth every 6 (six) hours as needed. Pain  30 tablet  0  . VITAMIN D, CHOLECALCIFEROL, PO Take 1 tablet by mouth daily.        . fluticasone (FLONASE) 50  MCG/ACT nasal spray HOLD until samples gone of nasonex, then resume      . cefdinir (OMNICEF)  300 MG capsule Take 2 capsules (600 mg total) by mouth daily.  20 capsule  0  . diazepam (VALIUM) 5 MG tablet as needed.      . fish oil-omega-3 fatty acids 1000 MG capsule HOLD      . mometasone (NASONEX) 50 MCG/ACT nasal spray Place 2 sprays into the nose daily.  17 g  6     Review of Systems  Constitutional:   No  weight loss, night sweats,  Fevers, chills, + fatigue, or  lassitude.  HEENT:   No headaches,  Difficulty swallowing,  Tooth/dental problems, or  Sore throat,                No sneezing, itching, ear ache, nasal congestion, post nasal drip,   CV:   Orthopnea, PND, swelling in lower extremities, anasarca, dizziness, palpitations, syncope.   GI  No heartburn, indigestion, abdominal pain, nausea, vomiting, diarrhea, change in bowel habits, loss of appetite, bloody stools.   Resp:    No coughing up of blood.    No chest wall deformity  Skin: no rash or lesions.  GU: no dysuria, change in color of urine, no urgency or frequency.  No flank pain, no hematuria   MS:  No joint pain or swelling.  No decreased range of motion.   Psych:  No change in mood or affect. No depression or anxiety.            Objective:   Physical Exam BP 130/80  Pulse 71  Temp 98.3 F (36.8 C) (Oral)  Ht 5\' 6"  (1.676 m)  Wt 154 lb 6.4 oz (70.035 kg)  BMI 24.92 kg/m2  SpO2 94%  GEN: A/Ox3; pleasant , NAD, elderly  HEENT:  [place]/AT,  EACs-clear, TMs-wnl, NOSE purulent  drainage THROAT postnasal drainage of purulent material  NECK:  Supple w/ fair ROM; no JVD; normal carotid impulses w/o bruits; no thyromegaly or nodules palpated; no lymphadenopathy.  RESP  Distant BS no accessory muscle use, no dullness to percussion  CARD:  RRR, no m/r/g  , no peripheral edema, pulses intact, no cyanosis or clubbing.  GI:   Soft & nt; nml bowel sounds; no organomegaly or masses detected.  Musco: Warm bil, no  deformities or joint swelling noted.   Neuro: alert, no focal deficits noted.    Skin: Warm, no lesions or rashes         Assessment & Plan:  COPD Gold stage C. COPD with recent exacerbation due to to sinusitis now improved Plan Extend Omnicef for 5 additional days Continue nasal hygiene Maintain inhaled medications as prescribed Return 2 months         Updated Medication List Outpatient Encounter Prescriptions as of 11/21/2011  Medication Sig Dispense Refill  . Aclidinium Bromide (TUDORZA PRESSAIR) 400 MCG/ACT AEPB Inhale 1 puff into the lungs 2 (two) times daily.  1 each  3  . ALPRAZolam (XANAX) 0.25 MG tablet Take 1 tablet by mouth as needed.      [name] aspirin EC 81 MG tablet Take 81 mg by mouth daily.        . Cyanocobalamin (VITAMIN B 12) 100 MCG LOZG Inject into the skin every 30 (thirty) days.      [name] esomeprazole (NEXIUM) 40 MG capsule Take 40 mg by mouth daily before breakfast.        . fluticasone (FLONASE) 50 MCG/ACT nasal spray Place 1 spray into the nose daily.      . furosemide (LASIX) 40 MG tablet  TAKE 1 TABLET BY MOUTH TWICE DAILY  60 tablet  5  . levothyroxine (SYNTHROID, LEVOTHROID) 25 MCG tablet Take 1 tablet by mouth daily.      . metoprolol tartrate (LOPRESSOR) 25 MG tablet TAKE 1 TABLET BY MOUTH TWICE DAILY  60 tablet  4  . nitroGLYCERIN (NITROSTAT) 0.4 MG SL tablet Place 1 tablet (0.4 mg total) under the tongue every 5 (five) minutes as needed for chest pain.  25 tablet  12  . NON FORMULARY as needed. Oxygen      . Polyethyl Glycol-Propyl Glycol (SYSTANE OP) Apply 1 drop to eye daily.        . potassium chloride SA (K-DUR,KLOR-CON) 20 MEQ tablet Take 1 tablet (20 mEq total) by mouth 2 (two) times daily.  60 tablet  6  . PROAIR HFA 108 (90 BASE) MCG/ACT inhaler Inhale 2 puffs into the lungs Every 6 hours as needed.      . traMADol (ULTRAM) 50 MG tablet Take 1 tablet (50 mg total) by mouth every 6 (six) hours as needed. Pain  30 tablet  0  . VITAMIN D,  CHOLECALCIFEROL, PO Take 1 tablet by mouth daily.        [name] DISCONTD: fluticasone (FLONASE) 50 MCG/ACT nasal spray HOLD until samples gone of nasonex, then resume      . cefdinir (OMNICEF) 300 MG capsule Take 2 capsules (600 mg total) by mouth daily.  10 capsule  0  . DISCONTD: cefdinir (OMNICEF) 300 MG capsule Take 2 capsules (600 mg total) by mouth daily.  20 capsule  0  . DISCONTD: diazepam (VALIUM) 5 MG tablet as needed.      [name] DISCONTD: fish oil-omega-3 fatty acids 1000 MG capsule HOLD      . DISCONTD: mometasone (NASONEX) 50 MCG/ACT nasal spray Place 2 sprays into the nose daily.  17 g  6

## 2011-11-21 NOTE — Assessment & Plan Note (Addendum)
Gold stage C. COPD with recent exacerbation due to to sinusitis now improved Plan Extend Omnicef for 5 additional days Continue nasal hygiene Maintain inhaled medications as prescribed Return 2 months

## 2011-11-21 NOTE — Patient Instructions (Addendum)
Extend antibiotic for 5 days further . Rx sent to pharmacy No other medication changes USe saline spray for two weeks twice daily

## 2011-11-23 ENCOUNTER — Ambulatory Visit (INDEPENDENT_AMBULATORY_CARE_PROVIDER_SITE_OTHER): Payer: Medicare Other | Admitting: Cardiology

## 2011-11-23 ENCOUNTER — Encounter: Payer: Self-pay | Admitting: Cardiology

## 2011-11-23 VITALS — BP 132/80 | HR 71 | Ht 66.0 in | Wt 150.8 lb

## 2011-11-23 DIAGNOSIS — I2510 Atherosclerotic heart disease of native coronary artery without angina pectoris: Secondary | ICD-10-CM | POA: Diagnosis not present

## 2011-11-23 DIAGNOSIS — R002 Palpitations: Secondary | ICD-10-CM

## 2011-11-23 DIAGNOSIS — J449 Chronic obstructive pulmonary disease, unspecified: Secondary | ICD-10-CM

## 2011-11-23 DIAGNOSIS — I4510 Unspecified right bundle-branch block: Secondary | ICD-10-CM | POA: Diagnosis not present

## 2011-11-23 NOTE — Patient Instructions (Signed)
Continue your current medication.  If you have more frequent or prolonged rhythm problems let me know.  I will see you again in 6 months.

## 2011-11-23 NOTE — Progress Notes (Signed)
Renee [name] Date of Birth: Nov 17, 1934 Medical Record #[id number]  History of Present Illness: Renee [name] is seen back today for a follow up visit.  She has recently been treated for COPD exacerbation related to sinusitis. She is still on antibiotic therapy. Her breathing is doing better. She has been experiencing sporadic episodes of tachycardia with heart rates up to 120 beats per minute. She states that it will go down to 40 beats per minute. Her last episode was 2 weeks ago when she was having her COPD exacerbation. With these episodes she does get some chest pain in takes nitroglycerin with relief. She denies any dizziness or syncope.   Current Outpatient Prescriptions on File Prior to Visit  Medication Sig Dispense Refill  . Aclidinium Bromide (TUDORZA PRESSAIR) 400 MCG/ACT AEPB Inhale 1 puff into the lungs 2 (two) times daily.  1 each  3  . ALPRAZolam (XANAX) 0.25 MG tablet Take 1 tablet by mouth as needed.      [name] aspirin EC 81 MG tablet Take 81 mg by mouth daily.        . cefdinir (OMNICEF) 300 MG capsule Take 2 capsules (600 mg total) by mouth daily.  10 capsule  0  . Cyanocobalamin (VITAMIN B 12) 100 MCG LOZG Inject into the skin every 30 (thirty) days.      [name] esomeprazole (NEXIUM) 40 MG capsule Take 40 mg by mouth daily before breakfast.        . fluticasone (FLONASE) 50 MCG/ACT nasal spray Place 1 spray into the nose daily.      . furosemide (LASIX) 40 MG tablet TAKE 1 TABLET BY MOUTH TWICE DAILY  60 tablet  5  . levothyroxine (SYNTHROID, LEVOTHROID) 25 MCG tablet Take 1 tablet by mouth daily.      . metoprolol tartrate (LOPRESSOR) 25 MG tablet TAKE 1 TABLET BY MOUTH TWICE DAILY  60 tablet  4  . nitroGLYCERIN (NITROSTAT) 0.4 MG SL tablet Place 1 tablet (0.4 mg total) under the tongue every 5 (five) minutes as needed for chest pain.  25 tablet  12  . NON FORMULARY as needed. Oxygen      . Polyethyl Glycol-Propyl Glycol (SYSTANE OP) Apply 1 drop to eye daily.        . potassium  chloride SA (K-DUR,KLOR-CON) 20 MEQ tablet Take 1 tablet (20 mEq total) by mouth 2 (two) times daily.  60 tablet  6  . PROAIR HFA 108 (90 BASE) MCG/ACT inhaler Inhale 2 puffs into the lungs Every 6 hours as needed.      . traMADol (ULTRAM) 50 MG tablet Take 1 tablet (50 mg total) by mouth every 6 (six) hours as needed. Pain  30 tablet  0  . VITAMIN D, CHOLECALCIFEROL, PO Take 1 tablet by mouth daily.        [name] DISCONTD: budesonide-formoterol (SYMBICORT) 160-4.5 MCG/ACT inhaler Inhale 2 puffs into the lungs 2 (two) times daily.  1 Inhaler  12  . DISCONTD: calcium citrate-vitamin D (CITRACAL+D) 315-200 MG-UNIT per tablet Take 1 tablet by mouth 2 (two) times daily.          Allergies  Allergen Reactions  . Statins     Legs hurt  . Levofloxacin Nausea Only    Unable to tolerate more than 7days  . Budesonide-Formoterol Fumarate     Tachycardia  . Morphine     REACTION: double vision  . Penicillins     REACTION: swelling/rash  . Phenazopyridine Hcl     *piridium*  REACTION: face red  . Pneumococcal Vaccine     REACTION: rash and cough  . Pneumococcal Vaccines Swelling  . Prednisone     REACTION: rash  . Prednisone Swelling  . Sulfonamide Derivatives     REACTION: rash  . Tylenol (Acetaminophen)     Tylenol #3 -- rash and swelling  . Penicillins Swelling and Rash  . Sulfa Antibiotics Rash    Past Medical History  Diagnosis Date  . GERD (gastroesophageal reflux disease)   . IBS (irritable bowel syndrome)   . Bundle branch block, right   . Coronary artery disease     prior CABG in August of 2011  . Emphysema/COPD   . Fatigue   . SOB (shortness of breath)   . Bruises easily   . Joint pain   . Weakness   . Anxiety   . Pain on voiding   . AF (atrial fibrillation)     Post op after CABG; off amiodarone  . Fracture of toe of left foot   . HTN (hypertension)   . CHF (congestive heart failure)   . Dysphagia   . Colon polyps   . Hypertension   . Hypercholesteremia   . Normal  nuclear stress test October 2012  . Myocardial infarction   . Hypothyroidism     Past Surgical History  Procedure Date  . Coronary artery bypass graft   . Total hip arthroplasty 11/01/2008    right  . Appendectomy 1995  . Cholecystectomy 2008    LARYNX  . Tonsillectomy and adenoidectomy   . Total abdominal hysterectomy 1986  . Cardiac surgery   . Joint replacement     Right Hip 2010    History  Smoking status  . Former Smoker -- 1.0 packs/day for 50 years  . Types: Cigarettes  . Quit date: 09/21/2009  Smokeless tobacco  . Never Used    History  Alcohol Use No    Family History  Problem Relation Age of Onset  . Heart disease Mother   . Transient ischemic attack Mother   . Hypertension Mother   . Deep vein thrombosis Mother   . Heart attack Father   . Hypertension Father   . Diabetes Father   . Heart disease Father     Heart disease before age 37  . Hyperlipidemia Father   . Breast cancer Sister   . Cancer Sister   . Other Sister     brain tumor  . Colon polyps Brother   . Hypertension Brother   . Colon cancer Neg Hx     Review of Systems: The review of systems is positive for recent COPD exacerbation. All other systems were reviewed and are negative.  Physical Exam: BP 132/80  Pulse 71  Ht 5\' 6"  (1.676 m)  Wt 68.402 kg (150 lb 12.8 oz)  BMI 24.34 kg/m2  SpO2 95% Patient is  pleasant and in no acute distress.  Skin is warm and dry. Color is normal.  HEENT is unremarkable. Normocephalic/atraumatic. PERRL. Sclera are nonicteric. Neck is supple. No masses. No JVD. Lungs are clear. Cardiac exam shows a regular rate and rhythm. Sternum is  tender to touch. Abdomen is soft. Extremities are without edema. Gait and ROM are intact. No gross neurologic deficits noted.   LABORATORY DATA: Recent chest x-ray showed scar in the left base. Otherwise clear. She had a recent MRI for followup of cerebral aneurysm and the results are still pending.  Assessment /  Plan: 1. Coronary disease  status post CABG in August of 2011. She does have some chest discomfort related to arrhythmia. This is sporadic. We will continue with her current medication including aspirin and metoprolol.  2. Palpitations. I suspect her recent episode was related to her COPD exacerbation. She is having symptoms less than every 2 weeks. If her symptoms should become more frequent or severe we will have her wear an event monitor. I am reluctant to increase her beta blocker since she does have some episodes of bradycardia as well.  3. Chronic diastolic heart failure. She appears to be well compensated. She is on chronic Lasix therapy.  4. COPD  5. Hypertension

## 2011-12-18 ENCOUNTER — Ambulatory Visit: Payer: Medicare Other | Admitting: Critical Care Medicine

## 2011-12-25 DIAGNOSIS — M545 Low back pain: Secondary | ICD-10-CM | POA: Diagnosis not present

## 2011-12-25 DIAGNOSIS — E538 Deficiency of other specified B group vitamins: Secondary | ICD-10-CM | POA: Diagnosis not present

## 2011-12-25 DIAGNOSIS — I10 Essential (primary) hypertension: Secondary | ICD-10-CM | POA: Diagnosis not present

## 2012-01-08 ENCOUNTER — Other Ambulatory Visit: Payer: Self-pay | Admitting: Neurology

## 2012-01-08 DIAGNOSIS — R269 Unspecified abnormalities of gait and mobility: Secondary | ICD-10-CM

## 2012-01-08 DIAGNOSIS — I671 Cerebral aneurysm, nonruptured: Secondary | ICD-10-CM

## 2012-01-08 DIAGNOSIS — I2510 Atherosclerotic heart disease of native coronary artery without angina pectoris: Secondary | ICD-10-CM

## 2012-01-21 ENCOUNTER — Ambulatory Visit
Admission: RE | Admit: 2012-01-21 | Discharge: 2012-01-21 | Disposition: A | Discharge status: Home / Self Care | Payer: Medicare Other | Source: Ambulatory Visit | Attending: Neurology | Admitting: Neurology

## 2012-01-21 DIAGNOSIS — R269 Unspecified abnormalities of gait and mobility: Secondary | ICD-10-CM | POA: Diagnosis not present

## 2012-01-21 DIAGNOSIS — I671 Cerebral aneurysm, nonruptured: Secondary | ICD-10-CM | POA: Diagnosis not present

## 2012-01-21 DIAGNOSIS — I2510 Atherosclerotic heart disease of native coronary artery without angina pectoris: Secondary | ICD-10-CM | POA: Diagnosis not present

## 2012-01-21 DIAGNOSIS — R42 Dizziness and giddiness: Secondary | ICD-10-CM | POA: Diagnosis not present

## 2012-01-21 MED ORDER — GADOBENATE DIMEGLUMINE 529 MG/ML IV SOLN
14.0000 mL | Freq: Once | INTRAVENOUS | Status: AC | PRN
  Administered 2012-01-21: 14 mL via INTRAVENOUS

## 2012-02-04 ENCOUNTER — Other Ambulatory Visit: Payer: Self-pay | Admitting: Nurse Practitioner

## 2012-02-20 ENCOUNTER — Ambulatory Visit (INDEPENDENT_AMBULATORY_CARE_PROVIDER_SITE_OTHER): Payer: Medicare Other | Admitting: Critical Care Medicine

## 2012-02-20 ENCOUNTER — Other Ambulatory Visit: Payer: Self-pay | Admitting: *Deleted

## 2012-02-20 ENCOUNTER — Encounter: Payer: Self-pay | Admitting: Critical Care Medicine

## 2012-02-20 ENCOUNTER — Other Ambulatory Visit: Payer: Self-pay

## 2012-02-20 VITALS — BP 102/64 | HR 72 | Temp 97.5°F | Ht 66.0 in | Wt 156.0 lb

## 2012-02-20 DIAGNOSIS — J4489 Other specified chronic obstructive pulmonary disease: Secondary | ICD-10-CM

## 2012-02-20 DIAGNOSIS — J449 Chronic obstructive pulmonary disease, unspecified: Secondary | ICD-10-CM

## 2012-02-20 DIAGNOSIS — J019 Acute sinusitis, unspecified: Secondary | ICD-10-CM

## 2012-02-20 MED ORDER — CEFDINIR 300 MG PO CAPS: 600.0000 mg | ORAL_CAPSULE | Freq: Every day | ORAL | Status: DC

## 2012-02-20 MED ORDER — METOPROLOL TARTRATE 25 MG PO TABS: 25.0000 mg | ORAL_TABLET | Freq: Two times a day (BID) | ORAL | Status: DC

## 2012-02-20 NOTE — Telephone Encounter (Signed)
Pt called requesting a refill of metoprolol 25mg  be sent into her pharmacy. Sent in refill for 60 tablets with 4 refills.

## 2012-02-20 NOTE — Assessment & Plan Note (Signed)
Gold stage C. COPD with mild exacerbation due to acute on chronic sinusitis Plan Cefdinir 300mg  Two daily for 7days Use flonase two puff daily for 7days then one puff daily Use saline nasal spray daily, work on Right nares No other medication changes Return 2 months

## 2012-02-20 NOTE — Patient Instructions (Addendum)
Cefdinir 300mg  Two daily for 7days Use flonase two puff daily for 7days then one puff daily Use saline nasal spray daily, work on Right nares No other medication changes Return 2 months

## 2012-02-20 NOTE — Progress Notes (Signed)
Subjective:    Patient ID: Renee [name], female    DOB: 1934-09-15, 78 y.o.   MRN: [id number]  HPI  78 y.o.   female with known hx of COPD -former smoker -Gold 3   02/20/2012 Pt got better after the last OV then worse before christmas.  Pt has periods of dyspnea.  Pt notes some sinus pressure on R side, ear is stopped up.  Pt notes pndrip, brown mucus is expectorated. Pt notes more dyspnea.  No real chest pain.  Pt notes some chest tightness. Throat will close up      Past Medical History  Diagnosis Date  . GERD (gastroesophageal reflux disease)   . IBS (irritable bowel syndrome)   . Bundle branch block, right   . Coronary artery disease     prior CABG in August of 2011  . Emphysema/COPD   . Fatigue   . SOB (shortness of breath)   . Bruises easily   . Joint pain   . Weakness   . Anxiety   . Pain on voiding   . AF (atrial fibrillation)     Post op after CABG; off amiodarone  . Fracture of toe of left foot   . HTN (hypertension)   . CHF (congestive heart failure)   . Dysphagia   . Colon polyps   . Hypertension   . Hypercholesteremia   . Normal nuclear stress test October 2012  . Myocardial infarction   . Hypothyroidism      Family History  Problem Relation Age of Onset  . Heart disease Mother   . Transient ischemic attack Mother   . Hypertension Mother   . Deep vein thrombosis Mother   . Heart attack Father   . Hypertension Father   . Diabetes Father   . Heart disease Father     Heart disease before age 39  . Hyperlipidemia Father   . Breast cancer Sister   . Cancer Sister   . Other Sister     brain tumor  . Colon polyps Brother   . Hypertension Brother   . Colon cancer Neg Hx      History   Social History  . Marital Status: Married    Spouse Name: N/A    Number of Children: 1  . Years of Education: N/A   Occupational History  . nurse     retired   Social History Main Topics  . Smoking status: Former Smoker -- 1.0 packs/day for 50 years     Types: Cigarettes    Quit date: 09/21/2009  . Smokeless tobacco: Never Used  . Alcohol Use: No  . Drug Use: No  . Sexually Active: Not on file   Other Topics Concern  . Not on file   Social History Narrative   ** Merged History Encounter ** Daily caffeine use: Dr Reino Kent and 2 cups coffee per day     Allergies  Allergen Reactions  . Statins     Legs hurt  . Levofloxacin Nausea Only    Unable to tolerate more than 7days  . Budesonide-Formoterol Fumarate     Tachycardia  . Morphine     REACTION: double vision  . Penicillins     REACTION: swelling/rash  . Phenazopyridine Hcl     *piridium* REACTION: face red  . Pneumococcal Vaccine     REACTION: rash and cough  . Pneumococcal Vaccines Swelling  . Prednisone     REACTION: rash  . Prednisone Swelling  . Sulfonamide  Derivatives     REACTION: rash  . Tylenol (Acetaminophen)     Tylenol #3 -- rash and swelling  . Penicillins Swelling and Rash  . Sulfa Antibiotics Rash     Outpatient Prescriptions Prior to Visit  Medication Sig Dispense Refill  . Aclidinium Bromide (TUDORZA PRESSAIR) 400 MCG/ACT AEPB Inhale 1 puff into the lungs 2 (two) times daily.  1 each  3  . ALPRAZolam (XANAX) 0.25 MG tablet Take 1 tablet by mouth as needed.      [name] aspirin EC 81 MG tablet Take 81 mg by mouth daily.        . Cyanocobalamin (VITAMIN B 12) 100 MCG LOZG Inject into the skin every 30 (thirty) days.      [name] esomeprazole (NEXIUM) 40 MG capsule Take 40 mg by mouth daily before breakfast.        . fluticasone (FLONASE) 50 MCG/ACT nasal spray Place 1 spray into the nose daily.      . furosemide (LASIX) 40 MG tablet TAKE 1 TABLET BY MOUTH TWICE DAILY  60 tablet  5  . levothyroxine (SYNTHROID, LEVOTHROID) 25 MCG tablet Take 1 tablet by mouth daily.      [name] NITROSTAT 0.4 MG SL tablet PLACE 1 TABLET UNDER THE TONGUE EVERY 5 MINUTES AS NEEDED FOR CHEST PAIN  25 tablet  0  . NON FORMULARY as needed. Oxygen      . Polyethyl Glycol-Propyl Glycol  (SYSTANE OP) Apply 1 drop to eye daily.        . potassium chloride SA (K-DUR,KLOR-CON) 20 MEQ tablet Take 1 tablet (20 mEq total) by mouth 2 (two) times daily.  60 tablet  6  . PROAIR HFA 108 (90 BASE) MCG/ACT inhaler Inhale 2 puffs into the lungs Every 6 hours as needed.      . traMADol (ULTRAM) 50 MG tablet Take 1 tablet (50 mg total) by mouth every 6 (six) hours as needed. Pain  30 tablet  0  . VITAMIN D, CHOLECALCIFEROL, PO Take 1 tablet by mouth daily.        . [DISCONTINUED] metoprolol tartrate (LOPRESSOR) 25 MG tablet TAKE 1 TABLET BY MOUTH TWICE DAILY  60 tablet  4  . [DISCONTINUED] cefdinir (OMNICEF) 300 MG capsule Take 2 capsules (600 mg total) by mouth daily.  10 capsule  0  Last reviewed on 02/20/2012 11:32 AM by Storm Frisk, MD   Review of Systems  Constitutional:   No  weight loss, night sweats,  Fevers, chills, + fatigue, or  lassitude.  HEENT:   No headaches,  Difficulty swallowing,  Tooth/dental problems, or  Sore throat,                No sneezing, itching, ear ache, nasal congestion, post nasal drip,   CV:   Orthopnea, PND, swelling in lower extremities, anasarca, dizziness, palpitations, syncope.   GI  No heartburn, indigestion, abdominal pain, nausea, vomiting, diarrhea, change in bowel habits, loss of appetite, bloody stools.   Resp:    No coughing up of blood.    No chest wall deformity  Skin: no rash or lesions.  GU: no dysuria, change in color of urine, no urgency or frequency.  No flank pain, no hematuria   MS:  No joint pain or swelling.  No decreased range of motion.   Psych:  No change in mood or affect. No depression or anxiety.            Objective:   Physical Exam  BP 102/64  Pulse 72  Temp 97.5 F (36.4 C) (Oral)  Ht 5\' 6"  (1.676 m)  Wt 156 lb (70.761 kg)  BMI 25.18 kg/m2  SpO2 95%  GEN: A/Ox3; pleasant , NAD, elderly  HEENT:  [place]/AT,  EACs-clear, TMs-wnl, NOSE purulent  drainage THROAT postnasal drainage of purulent  material  NECK:  Supple w/ fair ROM; no JVD; normal carotid impulses w/o bruits; no thyromegaly or nodules palpated; no lymphadenopathy.  RESP  Distant BS no accessory muscle use, no dullness to percussion  CARD:  RRR, no m/r/g  , no peripheral edema, pulses intact, no cyanosis or clubbing.  GI:   Soft & nt; nml bowel sounds; no organomegaly or masses detected.  Musco: Warm bil, no deformities or joint swelling noted.   Neuro: alert, no focal deficits noted.    Skin: Warm, no lesions or rashes         Assessment & Plan:  COPD Gold C Gold stage C. COPD with mild exacerbation due to acute on chronic sinusitis Plan Cefdinir 300mg  Two daily for 7days Use flonase two puff daily for 7days then one puff daily Use saline nasal spray daily, work on Right nares No other medication changes Return 2 months        Updated Medication List Outpatient Encounter Prescriptions as of 02/20/2012  Medication Sig Dispense Refill  . Aclidinium Bromide (TUDORZA PRESSAIR) 400 MCG/ACT AEPB Inhale 1 puff into the lungs 2 (two) times daily.  1 each  3  . ALPRAZolam (XANAX) 0.25 MG tablet Take 1 tablet by mouth as needed.      [name] aspirin EC 81 MG tablet Take 81 mg by mouth daily.        . Cyanocobalamin (VITAMIN B 12) 100 MCG LOZG Inject into the skin every 30 (thirty) days.      [name] esomeprazole (NEXIUM) 40 MG capsule Take 40 mg by mouth daily before breakfast.        . fluticasone (FLONASE) 50 MCG/ACT nasal spray Place 1 spray into the nose daily.      . furosemide (LASIX) 40 MG tablet TAKE 1 TABLET BY MOUTH TWICE DAILY  60 tablet  5  . ketorolac (ACULAR) 0.5 % ophthalmic solution Place 1 drop into the left eye daily. When remebers      . levothyroxine (SYNTHROID, LEVOTHROID) 25 MCG tablet Take 1 tablet by mouth daily.      [name] NITROSTAT 0.4 MG SL tablet PLACE 1 TABLET UNDER THE TONGUE EVERY 5 MINUTES AS NEEDED FOR CHEST PAIN  25 tablet  0  . NON FORMULARY as needed. Oxygen      . Polyethyl  Glycol-Propyl Glycol (SYSTANE OP) Apply 1 drop to eye daily.        . potassium chloride SA (K-DUR,KLOR-CON) 20 MEQ tablet Take 1 tablet (20 mEq total) by mouth 2 (two) times daily.  60 tablet  6  . PROAIR HFA 108 (90 BASE) MCG/ACT inhaler Inhale 2 puffs into the lungs Every 6 hours as needed.      . traMADol (ULTRAM) 50 MG tablet Take 1 tablet (50 mg total) by mouth every 6 (six) hours as needed. Pain  30 tablet  0  . VITAMIN D, CHOLECALCIFEROL, PO Take 1 tablet by mouth daily.        . [DISCONTINUED] metoprolol tartrate (LOPRESSOR) 25 MG tablet TAKE 1 TABLET BY MOUTH TWICE DAILY  60 tablet  4  . cefdinir (OMNICEF) 300 MG capsule Take 2 capsules (600 mg total) by  mouth daily.  14 capsule  0  . [DISCONTINUED] cefdinir (OMNICEF) 300 MG capsule Take 2 capsules (600 mg total) by mouth daily.  10 capsule  0

## 2012-02-28 DIAGNOSIS — E538 Deficiency of other specified B group vitamins: Secondary | ICD-10-CM | POA: Diagnosis not present

## 2012-02-29 ENCOUNTER — Telehealth: Payer: Self-pay | Admitting: Critical Care Medicine

## 2012-02-29 NOTE — Telephone Encounter (Signed)
Pt aware. Nothing further was needed 

## 2012-02-29 NOTE — Telephone Encounter (Signed)
i can support prn delsym

## 2012-02-29 NOTE — Telephone Encounter (Signed)
I spoke with pt. She stated she is having lots of drainage and it causing her to cough. She is coughing up brown phlem. She is wanting to know if she can take delsym. She was advised she should not take delsym bc it was not good d/t some of her "health problems" she has. She stated this has helped in the past before she was told not to take this. Please advise if okay for her to take the delsym Dr. Delford Field thanks  Allergies  Allergen Reactions  . Statins     Legs hurt  . Levofloxacin Nausea Only    Unable to tolerate more than 7days  . Budesonide-Formoterol Fumarate     Tachycardia  . Morphine     REACTION: double vision  . Penicillins     REACTION: swelling/rash  . Phenazopyridine Hcl     *piridium* REACTION: face red  . Pneumococcal Vaccine     REACTION: rash and cough  . Pneumococcal Vaccines Swelling  . Prednisone     REACTION: rash  . Prednisone Swelling  . Sulfonamide Derivatives     REACTION: rash  . Tylenol (Acetaminophen)     Tylenol #3 -- rash and swelling  . Penicillins Swelling and Rash  . Sulfa Antibiotics Rash

## 2012-04-16 ENCOUNTER — Encounter: Payer: Self-pay | Admitting: Critical Care Medicine

## 2012-04-16 ENCOUNTER — Ambulatory Visit (INDEPENDENT_AMBULATORY_CARE_PROVIDER_SITE_OTHER): Payer: Medicare Other | Admitting: Critical Care Medicine

## 2012-04-16 VITALS — BP 120/74 | HR 78 | Temp 97.9°F | Ht 66.0 in | Wt 156.0 lb

## 2012-04-16 DIAGNOSIS — I2510 Atherosclerotic heart disease of native coronary artery without angina pectoris: Secondary | ICD-10-CM

## 2012-04-16 DIAGNOSIS — J019 Acute sinusitis, unspecified: Secondary | ICD-10-CM

## 2012-04-16 DIAGNOSIS — J4489 Other specified chronic obstructive pulmonary disease: Secondary | ICD-10-CM

## 2012-04-16 DIAGNOSIS — J449 Chronic obstructive pulmonary disease, unspecified: Secondary | ICD-10-CM | POA: Diagnosis not present

## 2012-04-16 DIAGNOSIS — I25810 Atherosclerosis of coronary artery bypass graft(s) without angina pectoris: Secondary | ICD-10-CM

## 2012-04-16 MED ORDER — CEFDINIR 300 MG PO CAPS: 600.0000 mg | ORAL_CAPSULE | Freq: Every day | ORAL | Status: DC

## 2012-04-16 NOTE — Progress Notes (Signed)
Subjective:    Patient ID: Renee [name], female    DOB: 1934/06/12, 78 y.o.   MRN: [id number]  HPI  78 y.o.   female with known hx of COPD -former smoker -Gold 3   02/20/2012 Pt got better after the last OV then worse before christmas.  Pt has periods of dyspnea.  Pt notes some sinus pressure on R side, ear is stopped up.  Pt notes pndrip, brown mucus is expectorated. Pt notes more dyspnea.  No real chest pain.  Pt notes some chest tightness. Throat will close up  04/16/2012 Pt notes more dyspnea.  ? Weather.  Sinus infection 1/14>>better with above Rx then worse for another round of abx doxy per pcp. Then improved. Currently: no real cough, when do cough is productive of colored mucus, notes some pndrip out of the nose, no real chest pain.  No wheeze,  Does not feel congested.  Notes sinus pressure R side, The patient also notes chest tightness and dyspnea with exertion and even at rest while on oxygen. Her last cardiac evaluation for ischemia was in October 2012 and her bypass surgery was in 2011  Past Medical History  Diagnosis Date  . GERD (gastroesophageal reflux disease)   . IBS (irritable bowel syndrome)   . Bundle branch block, right   . Coronary artery disease     prior CABG in August of 2011  . Emphysema/COPD   . Fatigue   . Joint pain   . Anxiety   . AF (atrial fibrillation)     Post op after CABG; off amiodarone  . HTN (hypertension)   . CHF (congestive heart failure)   . Dysphagia   . Colon polyps   . Hypertension   . Hypercholesteremia   . Normal nuclear stress test October 2012  . Myocardial infarction   . Hypothyroidism      Family History  Problem Relation Age of Onset  . Heart disease Mother   . Transient ischemic attack Mother   . Hypertension Mother   . Deep vein thrombosis Mother   . Heart attack Father   . Hypertension Father   . Diabetes Father   . Heart disease Father     Heart disease before age 38  . Hyperlipidemia Father   . Breast  cancer Sister   . Cancer Sister   . Other Sister     brain tumor  . Colon polyps Brother   . Hypertension Brother   . Colon cancer Neg Hx      History   Social History  . Marital Status: Married    Spouse Name: N/A    Number of Children: 1  . Years of Education: N/A   Occupational History  . nurse     retired   Social History Main Topics  . Smoking status: Former Smoker -- 1.00 packs/day for 50 years    Types: Cigarettes    Quit date: 09/21/2009  . Smokeless tobacco: Never Used  . Alcohol Use: No  . Drug Use: No  . Sexually Active: Not on file   Other Topics Concern  . Not on file   Social History Narrative   ** Merged History Encounter **       Daily caffeine use: Dr Reino Kent and 2 cups coffee per day     Allergies  Allergen Reactions  . Statins     Legs hurt  . Levofloxacin Nausea Only    Unable to tolerate more than 7days  . Budesonide-Formoterol  Fumarate     Tachycardia  . Morphine     REACTION: double vision  . Penicillins     REACTION: swelling/rash  . Phenazopyridine Hcl     *piridium* REACTION: face red  . Pneumococcal Vaccine     REACTION: rash and cough  . Pneumococcal Vaccines Swelling    Redding of the skin  . Prednisone     REACTION: rash  . Prednisone Swelling  . Sulfonamide Derivatives     REACTION: rash  . Tylenol (Acetaminophen)     Tylenol #3 -- rash and swelling  . Penicillins Swelling and Rash  . Sulfa Antibiotics Rash     Outpatient Prescriptions Prior to Visit  Medication Sig Dispense Refill  . Aclidinium Bromide (TUDORZA PRESSAIR) 400 MCG/ACT AEPB Inhale 1 puff into the lungs 2 (two) times daily.  1 each  3  . ALPRAZolam (XANAX) 0.25 MG tablet Take 1 tablet by mouth as needed.      [name] aspirin EC 81 MG tablet Take 81 mg by mouth daily.        . Cyanocobalamin (VITAMIN B 12) 100 MCG LOZG Inject into the skin every 30 (thirty) days.      [name] esomeprazole (NEXIUM) 40 MG capsule Take 40 mg by mouth daily before breakfast.         . fluticasone (FLONASE) 50 MCG/ACT nasal spray Place 1 spray into the nose daily.      . furosemide (LASIX) 40 MG tablet TAKE 1 TABLET BY MOUTH TWICE DAILY  60 tablet  5  . ketorolac (ACULAR) 0.5 % ophthalmic solution Place 1 drop into the left eye daily. When remebers      . levothyroxine (SYNTHROID, LEVOTHROID) 25 MCG tablet Take 1 tablet by mouth daily.      . metoprolol tartrate (LOPRESSOR) 25 MG tablet Take 1 tablet (25 mg total) by mouth 2 (two) times daily.  60 tablet  4  . NITROSTAT 0.4 MG SL tablet PLACE 1 TABLET UNDER THE TONGUE EVERY 5 MINUTES AS NEEDED FOR CHEST PAIN  25 tablet  0  . NON FORMULARY as needed. Oxygen - 3L pulse with extertion, 2L continuious QHS.      Bertram Gala Glycol-Propyl Glycol (SYSTANE OP) Apply 1 drop to eye daily.        . potassium chloride SA (K-DUR,KLOR-CON) 20 MEQ tablet Take 1 tablet (20 mEq total) by mouth 2 (two) times daily.  60 tablet  6  . PROAIR HFA 108 (90 BASE) MCG/ACT inhaler Inhale 2 puffs into the lungs Every 6 hours as needed.      . traMADol (ULTRAM) 50 MG tablet Take 1 tablet (50 mg total) by mouth every 6 (six) hours as needed. Pain  30 tablet  0  . VITAMIN D, CHOLECALCIFEROL, PO Take 1 tablet by mouth daily.        . cefdinir (OMNICEF) 300 MG capsule Take 2 capsules (600 mg total) by mouth daily.  14 capsule  0   No facility-administered medications prior to visit.     Review of Systems  Constitutional:   No  weight loss, night sweats,  Fevers, chills, + fatigue, or  lassitude.  HEENT:   No headaches,  Difficulty swallowing,  Tooth/dental problems, or  Sore throat,                No sneezing, itching, ear ache,+++ nasal congestion,+++ post nasal drip,   CV:   Orthopnea, PND, swelling in lower extremities, anasarca, dizziness, palpitations, syncope.  GI  No heartburn, indigestion, abdominal pain, nausea, vomiting, diarrhea, change in bowel habits, loss of appetite, bloody stools.   Resp:    No coughing up of blood.    No chest  wall deformity  Skin: no rash or lesions.  GU: no dysuria, change in color of urine, no urgency or frequency.  No flank pain, no hematuria   MS:  No joint pain or swelling.  No decreased range of motion.   Psych:  No change in mood or affect. No depression or anxiety.            Objective:   Physical Exam BP 120/74  Pulse 78  Temp(Src) 97.9 F (36.6 C) (Oral)  Ht 5\' 6"  (1.676 m)  Wt 156 lb (70.761 kg)  BMI 25.19 kg/m2  SpO2 98%  GEN: A/Ox3; pleasant , NAD, elderly  HEENT:  [place]/AT,  EACs-clear, TMs-wnl, NOSE purulent  drainage THROAT postnasal drainage of purulent material  NECK:  Supple w/ fair ROM; no JVD; normal carotid impulses w/o bruits; no thyromegaly or nodules palpated; no lymphadenopathy.  RESP  Distant BS no accessory muscle use, no dullness to percussion  CARD:  RRR, no m/r/g  , no peripheral edema, pulses intact, no cyanosis or clubbing.  GI:   Soft & nt; nml bowel sounds; no organomegaly or masses detected.  Musco: Warm bil, no deformities or joint swelling noted.   Neuro: alert, no focal deficits noted.    Skin: Warm, no lesions or rashes         Assessment & Plan:  COPD Gold C Gold stage C. COPD with recurrent exacerbation due to ongoing sinusitis Plan Take Cefdinir 600mg  daily for 10days Obtain a Ct sinus  Referral back to Cardiology Dr Swaziland ?angina equivalent No change in inhaled meds Return 6 weeks     Coronary artery disease History of coronary artery disease with previous bypass surgery 2011 now with exertional chest tightness which may well be an angina equivalent. The last Myoview that was obtained was in October 2012. Plan Referral back to cardiology to see if some of the dyspnea and chest tightness with exertion could be related to cardiac ischemia. The patient even has these episodes at rest on oxygen making a pulmonary etiology unlikely       Updated Medication List Outpatient Encounter Prescriptions as of 04/16/2012   Medication Sig Dispense Refill  . Aclidinium Bromide (TUDORZA PRESSAIR) 400 MCG/ACT AEPB Inhale 1 puff into the lungs 2 (two) times daily.  1 each  3  . ALPRAZolam (XANAX) 0.25 MG tablet Take 1 tablet by mouth as needed.      [name] aspirin EC 81 MG tablet Take 81 mg by mouth daily.        . Cyanocobalamin (VITAMIN B 12) 100 MCG LOZG Inject into the skin every 30 (thirty) days.      [name] esomeprazole (NEXIUM) 40 MG capsule Take 40 mg by mouth daily before breakfast.        . fluticasone (FLONASE) 50 MCG/ACT nasal spray Place 1 spray into the nose daily.      . furosemide (LASIX) 40 MG tablet TAKE 1 TABLET BY MOUTH TWICE DAILY  60 tablet  5  . ketorolac (ACULAR) 0.5 % ophthalmic solution Place 1 drop into the left eye daily. When remebers      . levothyroxine (SYNTHROID, LEVOTHROID) 25 MCG tablet Take 1 tablet by mouth daily.      . metoprolol tartrate (LOPRESSOR) 25 MG tablet Take 1 tablet (  25 mg total) by mouth 2 (two) times daily.  60 tablet  4  . NITROSTAT 0.4 MG SL tablet PLACE 1 TABLET UNDER THE TONGUE EVERY 5 MINUTES AS NEEDED FOR CHEST PAIN  25 tablet  0  . NON FORMULARY as needed. Oxygen - 3L pulse with extertion, 2L continuious QHS.      Bertram Gala Glycol-Propyl Glycol (SYSTANE OP) Apply 1 drop to eye daily.        . potassium chloride SA (K-DUR,KLOR-CON) 20 MEQ tablet Take 1 tablet (20 mEq total) by mouth 2 (two) times daily.  60 tablet  6  . PROAIR HFA 108 (90 BASE) MCG/ACT inhaler Inhale 2 puffs into the lungs Every 6 hours as needed.      . traMADol (ULTRAM) 50 MG tablet Take 1 tablet (50 mg total) by mouth every 6 (six) hours as needed. Pain  30 tablet  0  . VITAMIN D, CHOLECALCIFEROL, PO Take 1 tablet by mouth daily.        . cefdinir (OMNICEF) 300 MG capsule Take 2 capsules (600 mg total) by mouth daily.  20 capsule  0  . [DISCONTINUED] cefdinir (OMNICEF) 300 MG capsule Take 2 capsules (600 mg total) by mouth daily.  14 capsule  0   No facility-administered encounter medications on  file as of 04/16/2012.

## 2012-04-16 NOTE — Patient Instructions (Addendum)
Take Cefdinir 600mg  daily for 10days Obtain a Ct sinus  Referral back to Cardiology Dr Swaziland ?angina equivalent No change in inhaled meds Return 6 weeks

## 2012-04-17 ENCOUNTER — Encounter: Payer: Self-pay | Admitting: Critical Care Medicine

## 2012-04-17 NOTE — Assessment & Plan Note (Signed)
History of coronary artery disease with previous bypass surgery 2011 now with exertional chest tightness which may well be an angina equivalent. The last Myoview that was obtained was in October 2012. Plan Referral back to cardiology to see if some of the dyspnea and chest tightness with exertion could be related to cardiac ischemia. The patient even has these episodes at rest on oxygen making a pulmonary etiology unlikely

## 2012-04-17 NOTE — Assessment & Plan Note (Addendum)
Gold stage C. COPD with recurrent exacerbation due to ongoing sinusitis Plan Take Cefdinir 600mg  daily for 10days Obtain a Ct sinus  Referral back to Cardiology Dr Swaziland ?angina equivalent No change in inhaled meds Return 6 weeks

## 2012-04-22 DIAGNOSIS — E538 Deficiency of other specified B group vitamins: Secondary | ICD-10-CM | POA: Diagnosis not present

## 2012-04-23 ENCOUNTER — Encounter: Payer: Self-pay | Admitting: Nurse Practitioner

## 2012-04-23 ENCOUNTER — Ambulatory Visit (INDEPENDENT_AMBULATORY_CARE_PROVIDER_SITE_OTHER)
Admission: RE | Admit: 2012-04-23 | Discharge: 2012-04-23 | Disposition: A | Discharge status: Home / Self Care | Payer: Medicare Other | Source: Ambulatory Visit | Attending: Critical Care Medicine | Admitting: Critical Care Medicine

## 2012-04-23 ENCOUNTER — Ambulatory Visit (INDEPENDENT_AMBULATORY_CARE_PROVIDER_SITE_OTHER): Payer: Medicare Other | Admitting: Nurse Practitioner

## 2012-04-23 VITALS — BP 128/82 | HR 82 | Ht 66.0 in | Wt 155.8 lb

## 2012-04-23 DIAGNOSIS — R0600 Dyspnea, unspecified: Secondary | ICD-10-CM

## 2012-04-23 DIAGNOSIS — R0989 Other specified symptoms and signs involving the circulatory and respiratory systems: Secondary | ICD-10-CM | POA: Diagnosis not present

## 2012-04-23 DIAGNOSIS — J019 Acute sinusitis, unspecified: Secondary | ICD-10-CM

## 2012-04-23 DIAGNOSIS — I25810 Atherosclerosis of coronary artery bypass graft(s) without angina pectoris: Secondary | ICD-10-CM | POA: Diagnosis not present

## 2012-04-23 DIAGNOSIS — R0609 Other forms of dyspnea: Secondary | ICD-10-CM

## 2012-04-23 DIAGNOSIS — J3489 Other specified disorders of nose and nasal sinuses: Secondary | ICD-10-CM | POA: Diagnosis not present

## 2012-04-23 NOTE — Patient Instructions (Signed)
We need to arrange for a repeat stress test and ultrasound of your heart  Try to stay active as possible  Stay on your same medicines for now  See Dr. Swaziland in 4 months  Call the [facility] office at [phone] if you have any questions, problems or concerns.

## 2012-04-23 NOTE — Progress Notes (Signed)
Renee [name]: 06-18-1934 Medical Record #[id number]  History of Present Illness: Renee [name] is seen back today for an early check today.  She is seen for Dr. Swaziland. She has known CAD with past CABG in August of 2011, negative stress test in 2012, did have post op atrial fib - on amiodarone for a short time. Other issues include chronic diastolic heart failure, COPD, sinusitis, GERD, RBBB, anxiety, HTN, HLD and hypothyroidism.   She comes in today. She is here alone. She is doing ok. Has had issues with sinusitis. For CT today per Dr. Delford Field for this. Saw him earlier this month. She told him she was continuing to have chest tightness and dyspnea - concerning for her anginal equivalent. Seems more limited by her breathing to me. She says she just doesn't know how she is doing. She does not like to use her oxygen - says she does not want to get dependent on it. Does not like what her CABG scar looks like - saw dermatology but did not like what they said. Not very active. Does not like the cold winter weather. Overall, just not very happy with life. No swelling. No NTG use. No extra Metoprolol recently. Seeing Dr. Clelia Croft next week for her physical and labs.   Current Outpatient Prescriptions on File Prior to Visit  Medication Sig Dispense Refill  . Aclidinium Bromide (TUDORZA PRESSAIR) 400 MCG/ACT AEPB Inhale 1 puff into the lungs 2 (two) times daily.  1 each  3  . ALPRAZolam (XANAX) 0.25 MG tablet Take 1 tablet by mouth as needed.      [name] aspirin EC 81 MG tablet Take 81 mg by mouth daily.        . cefdinir (OMNICEF) 300 MG capsule Take 2 capsules (600 mg total) by mouth daily.  20 capsule  0  . Cyanocobalamin (VITAMIN B 12) 100 MCG LOZG Inject into the skin every 30 (thirty) days.      [name] esomeprazole (NEXIUM) 40 MG capsule Take 40 mg by mouth daily before breakfast.        . fluticasone (FLONASE) 50 MCG/ACT nasal spray Place 1 spray into the nose daily.      . furosemide (LASIX) 40 MG  tablet TAKE 1 TABLET BY MOUTH TWICE DAILY  60 tablet  5  . ketorolac (ACULAR) 0.5 % ophthalmic solution Place 1 drop into the left eye daily. When remebers      . levothyroxine (SYNTHROID, LEVOTHROID) 25 MCG tablet Take 1 tablet by mouth daily.      . metoprolol tartrate (LOPRESSOR) 25 MG tablet Take 1 tablet (25 mg total) by mouth 2 (two) times daily.  60 tablet  4  . NITROSTAT 0.4 MG SL tablet PLACE 1 TABLET UNDER THE TONGUE EVERY 5 MINUTES AS NEEDED FOR CHEST PAIN  25 tablet  0  . NON FORMULARY as needed. Oxygen - 3L pulse with extertion, 2L continuious QHS.      Renee [name] Glycol-Propyl Glycol (SYSTANE OP) Apply 1 drop to eye daily.        . potassium chloride SA (K-DUR,KLOR-CON) 20 MEQ tablet Take 1 tablet (20 mEq total) by mouth 2 (two) times daily.  60 tablet  6  . PROAIR HFA 108 (90 BASE) MCG/ACT inhaler Inhale 2 puffs into the lungs Every 6 hours as needed.      . traMADol (ULTRAM) 50 MG tablet Take 1 tablet (50 mg total) by mouth every 6 (six) hours as needed. Pain  30 tablet  0  . VITAMIN D, CHOLECALCIFEROL, PO Take 1 tablet by mouth daily.        . [DISCONTINUED] budesonide-formoterol (SYMBICORT) 160-4.5 MCG/ACT inhaler Inhale 2 puffs into the lungs 2 (two) times daily.  1 Inhaler  12  . [DISCONTINUED] calcium citrate-vitamin D (CITRACAL+D) 315-200 MG-UNIT per tablet Take 1 tablet by mouth 2 (two) times daily.         No current facility-administered medications on file prior to visit.    Allergies  Allergen Reactions  . Statins     Legs hurt  . Levofloxacin Nausea Only    Unable to tolerate more than 7days  . Budesonide-Formoterol Fumarate     Tachycardia  . Morphine     REACTION: double vision  . Penicillins     REACTION: swelling/rash  . Phenazopyridine Hcl     *piridium* REACTION: face red  . Pneumococcal Vaccine     REACTION: rash and cough  . Pneumococcal Vaccines Swelling    Redding of the skin  . Prednisone     REACTION: rash  . Prednisone Swelling  .  Sulfonamide Derivatives     REACTION: rash  . Tylenol (Acetaminophen)     Tylenol #3 -- rash and swelling  . Penicillins Swelling and Rash  . Sulfa Antibiotics Rash    Past Medical History  Diagnosis Date  . GERD (gastroesophageal reflux disease)   . IBS (irritable bowel syndrome)   . Bundle branch block, right   . Coronary artery disease     prior CABG in August of 2011  . Emphysema/COPD   . Fatigue   . Joint pain   . Anxiety   . AF (atrial fibrillation)     Post op after CABG; off amiodarone  . HTN (hypertension)   . CHF (congestive heart failure)   . Dysphagia   . Colon polyps   . Hypertension   . Hypercholesteremia   . Normal nuclear stress test October 2012  . Myocardial infarction   . Hypothyroidism     Past Surgical History  Procedure Laterality Date  . Coronary artery bypass graft    . Total hip arthroplasty  11/01/2008    right  . Appendectomy  1995  . Cholecystectomy  2008    LARYNX  . Tonsillectomy and adenoidectomy    . Total abdominal hysterectomy  1986  . Cardiac surgery    . Joint replacement      Right Hip 2010    History  Smoking status  . Former Smoker -- 1.00 packs/day for 50 years  . Types: Cigarettes  . Quit date: 09/21/2009  Smokeless tobacco  . Never Used    History  Alcohol Use No    Family History  Problem Relation Age of Onset  . Heart disease Mother   . Transient ischemic attack Mother   . Hypertension Mother   . Deep vein thrombosis Mother   . Heart attack Father   . Hypertension Father   . Diabetes Father   . Heart disease Father     Heart disease before age 29  . Hyperlipidemia Father   . Breast cancer Sister   . Cancer Sister   . Other Sister     brain tumor  . Colon polyps Brother   . Hypertension Brother   . Colon cancer Neg Hx     Review of Systems: The review of systems is per the HPI.  All other systems were reviewed and are negative.  Physical Exam:  BP 128/82  Pulse 82  Ht 5\' 6"  (1.676 m)  Wt  155 lb 12.8 oz (70.67 kg)  BMI 25.16 kg/m2  SpO2 98% Patient is alert and in no acute distress. Seems depressed to me. Skin is warm and dry. Color is normal.  HEENT is unremarkable. Normocephalic/atraumatic. PERRL. Sclera are nonicteric. Neck is supple. No masses. No JVD. Lungs are clear. Cardiac exam shows a regular rate and rhythm. Sternum looks ok. Does have a keloid.  Abdomen is soft. Extremities are without edema. Gait and ROM are intact. No gross neurologic deficits noted.   LABORATORY DATA: N/A  Lab Results  Component Value Date   WBC 9.8 03/17/2011   HGB 13.4 03/17/2011   HCT 43.3 03/17/2011   PLT 217 09/25/2009   GLUCOSE 86 12/26/2010   CHOL  Value: 207        ATP III CLASSIFICATION:  <200     mg/dL   Desirable  161-096  mg/dL   Borderline High  >=045    mg/dL   High* 05/14/8117   TRIG 116 07/16/2007   HDL 45 07/16/2007   LDLCALC  Value: 139        Total Cholesterol/HDL:CHD Risk Coronary Heart Disease Risk Table                     Men   Women  1/2 Average Risk   3.4   3.3* 07/16/2007   ALT 15 09/21/2009   AST 23 09/21/2009   NA 140 12/26/2010   K 4.0 12/26/2010   CL 100 12/26/2010   CREATININE 1.1 12/26/2010   BUN 14 12/26/2010   CO2 29 12/26/2010   TSH 0.756 09/22/2009   INR 1.22 09/21/2009   HGBA1C  Value: 5.7 (NOTE)                                                                       According to the ADA Clinical Practice Recommendations for 2011, when HbA1c is used as a screening test:   >=6.5%   Diagnostic of Diabetes Mellitus           (if abnormal result  is confirmed)  5.7-6.4%   Increased risk of developing Diabetes Mellitus  References:Diagnosis and Classification of Diabetes Mellitus,Diabetes Care,2011,34(Suppl 1):S62-S69 and Standards of Medical Care in         Diabetes - 2011,Diabetes Care,2011,34  (Suppl 1):S11-S61.* 09/19/2009    Assessment / Plan: 1. CAD - with past CABG - has continued to endorse chest tightness with some jaw involvement - will update her stress test.    2. Dyspnea - this is chronic - does have diastolic heart failure - will update her echo  3. Sinusitis - for CT today  Seeing Dr. Swaziland back next month. Will proceed with cardiac evaluation to rule out that her symptoms are her angina equivalent. No change in her medicines for now. She sees her PCP next week for her physical and labs. She does seem depressed to me but hopefully with the warmer weather and spring coming she will improve.   Patient is agreeable to this plan and will call if any problems develop in the interim.   Rosalio Macadamia, RN, ANP-C [facility] [address]  [address] [place], [place]  [address]

## 2012-04-23 NOTE — Progress Notes (Signed)
Quick Note:  Notify the patient that the CT Sinus is NEGATIVE< No chronic sinus disease No change in medications as before ______

## 2012-04-24 ENCOUNTER — Other Ambulatory Visit: Payer: Self-pay | Admitting: Critical Care Medicine

## 2012-04-24 DIAGNOSIS — E785 Hyperlipidemia, unspecified: Secondary | ICD-10-CM | POA: Diagnosis not present

## 2012-04-24 DIAGNOSIS — R82998 Other abnormal findings in urine: Secondary | ICD-10-CM | POA: Diagnosis not present

## 2012-04-24 DIAGNOSIS — M899 Disorder of bone, unspecified: Secondary | ICD-10-CM | POA: Diagnosis not present

## 2012-04-24 DIAGNOSIS — E538 Deficiency of other specified B group vitamins: Secondary | ICD-10-CM | POA: Diagnosis not present

## 2012-04-24 DIAGNOSIS — E559 Vitamin D deficiency, unspecified: Secondary | ICD-10-CM | POA: Diagnosis not present

## 2012-04-24 DIAGNOSIS — E039 Hypothyroidism, unspecified: Secondary | ICD-10-CM | POA: Diagnosis not present

## 2012-04-24 MED ORDER — ACLIDINIUM BROMIDE 400 MCG/ACT IN AEPB: 1.0000 | INHALATION_SPRAY | Freq: Two times a day (BID) | RESPIRATORY_TRACT | Status: DC

## 2012-04-25 ENCOUNTER — Telehealth: Payer: Self-pay | Admitting: Critical Care Medicine

## 2012-04-25 DIAGNOSIS — J449 Chronic obstructive pulmonary disease, unspecified: Secondary | ICD-10-CM

## 2012-04-25 NOTE — Telephone Encounter (Signed)
i am ok with all the requests

## 2012-04-25 NOTE — Telephone Encounter (Signed)
PW please advise. thanks

## 2012-04-25 NOTE — Telephone Encounter (Signed)
Called and spoke with patient and advised her that I had placed a call to [facility] to check on status of order. Pam is checking with her call center to see if there is any documentation needed or if they need anything else from [place] to process this order. Patient is aware that we are checking into this and it may be Monday before we hear anything. Rhonda J Cobb

## 2012-04-25 NOTE — Telephone Encounter (Signed)
Called patient and she states that she has never heard anything from [facility] about switching back to them for her o2. Called [facility] and spoke with Derrill Memo states that she will call her "call center" to inquire as to the status of the order. Rhonda J Cobb

## 2012-04-25 NOTE — Telephone Encounter (Signed)
Pam with [facility] returned my call and stated that they had everything they needed and would be setting the patient up on Monday 04/28/12.   Pt is requesting: 1) an order for a portable concentrator to be faxed to [facility]. If ok, please send referral to [facility]  2) D/C order to [facility] to D/C oxygen with this company once [facility] delivers their equipment and tanks on Monday. Rhonda J Cobb

## 2012-04-28 DIAGNOSIS — E039 Hypothyroidism, unspecified: Secondary | ICD-10-CM | POA: Diagnosis not present

## 2012-04-28 DIAGNOSIS — E538 Deficiency of other specified B group vitamins: Secondary | ICD-10-CM | POA: Diagnosis not present

## 2012-04-28 DIAGNOSIS — Z1331 Encounter for screening for depression: Secondary | ICD-10-CM | POA: Diagnosis not present

## 2012-04-28 DIAGNOSIS — Z0000 Encounter for general adult medical examination without abnormal findings: Secondary | ICD-10-CM | POA: Diagnosis not present

## 2012-04-28 DIAGNOSIS — I10 Essential (primary) hypertension: Secondary | ICD-10-CM | POA: Diagnosis not present

## 2012-04-28 DIAGNOSIS — E785 Hyperlipidemia, unspecified: Secondary | ICD-10-CM | POA: Diagnosis not present

## 2012-04-28 DIAGNOSIS — I2510 Atherosclerotic heart disease of native coronary artery without angina pectoris: Secondary | ICD-10-CM | POA: Diagnosis not present

## 2012-04-28 DIAGNOSIS — I252 Old myocardial infarction: Secondary | ICD-10-CM | POA: Diagnosis not present

## 2012-04-28 DIAGNOSIS — J449 Chronic obstructive pulmonary disease, unspecified: Secondary | ICD-10-CM | POA: Diagnosis not present

## 2012-04-28 DIAGNOSIS — Z951 Presence of aortocoronary bypass graft: Secondary | ICD-10-CM | POA: Diagnosis not present

## 2012-04-28 NOTE — Telephone Encounter (Signed)
LMOAM (home number) for pt to call me at [phone] when [facility] gets their oxygen in pt's home. Rhonda J Cobb

## 2012-04-28 NOTE — Telephone Encounter (Signed)
Spoke with patient She is aware that PW ok with orders being placed. Patient requesting orders not be placed until she calls our office back (once [facility] sends out her new equipment) Once patient returns call please place/fax orders

## 2012-04-29 ENCOUNTER — Encounter (HOSPITAL_COMMUNITY): Payer: Medicare Other

## 2012-04-29 NOTE — Telephone Encounter (Signed)
Pt states that [facility] is requesting order for portable concentrator in order to start process on obtaining this for the patient. Please send referral for [facility] to arrange for portable concentrator and liter flow, please. Renee [name] We will hold off on the D/C order to [facility] until all o2 from [facility] is in place in pt's home. [name]

## 2012-04-29 NOTE — Telephone Encounter (Signed)
Order placed as requested Will route back to [facility] as requested by Bjorn Loser

## 2012-05-01 ENCOUNTER — Other Ambulatory Visit (HOSPITAL_COMMUNITY): Payer: Medicare Other

## 2012-05-02 NOTE — Telephone Encounter (Signed)
Spoke with patient today and she contacted [facility] and they are still waiting on approval for the portable concentrator. Pt stated that if [facility] hasn't obtained approval for the portable concentrator the first part of the week that she will have [facility] deliver the oxygen and will have D/C order faxed to [facility]. Will check back with patient the first part of the week. Rhonda J Cobb

## 2012-05-06 NOTE — Telephone Encounter (Signed)
ATC pt to see if she has heard from [facility] regarding approval for portable concentrator.  No answer and unable to leave message .  Will try again later.

## 2012-05-08 ENCOUNTER — Ambulatory Visit (HOSPITAL_COMMUNITY): Payer: Medicare Other | Attending: Cardiology | Admitting: Radiology

## 2012-05-08 ENCOUNTER — Telehealth: Payer: Self-pay | Admitting: Critical Care Medicine

## 2012-05-08 VITALS — BP 128/76 | HR 67 | Ht 66.0 in | Wt 153.0 lb

## 2012-05-08 DIAGNOSIS — I2510 Atherosclerotic heart disease of native coronary artery without angina pectoris: Secondary | ICD-10-CM

## 2012-05-08 DIAGNOSIS — R002 Palpitations: Secondary | ICD-10-CM | POA: Insufficient documentation

## 2012-05-08 DIAGNOSIS — I25810 Atherosclerosis of coronary artery bypass graft(s) without angina pectoris: Secondary | ICD-10-CM

## 2012-05-08 DIAGNOSIS — R0602 Shortness of breath: Secondary | ICD-10-CM | POA: Insufficient documentation

## 2012-05-08 DIAGNOSIS — R5383 Other fatigue: Secondary | ICD-10-CM | POA: Insufficient documentation

## 2012-05-08 DIAGNOSIS — R079 Chest pain, unspecified: Secondary | ICD-10-CM | POA: Diagnosis not present

## 2012-05-08 DIAGNOSIS — R5381 Other malaise: Secondary | ICD-10-CM | POA: Diagnosis not present

## 2012-05-08 DIAGNOSIS — R55 Syncope and collapse: Secondary | ICD-10-CM | POA: Diagnosis not present

## 2012-05-08 DIAGNOSIS — I4510 Unspecified right bundle-branch block: Secondary | ICD-10-CM

## 2012-05-08 DIAGNOSIS — R0600 Dyspnea, unspecified: Secondary | ICD-10-CM

## 2012-05-08 MED ORDER — REGADENOSON 0.4 MG/5ML IV SOLN
0.4000 mg | Freq: Once | INTRAVENOUS | Status: AC
  Administered 2012-05-08: 0.4 mg via INTRAVENOUS

## 2012-05-08 MED ORDER — TECHNETIUM TC 99M SESTAMIBI GENERIC - CARDIOLITE
10.0000 | Freq: Once | INTRAVENOUS | Status: AC | PRN
  Administered 2012-05-08: 10 via INTRAVENOUS

## 2012-05-08 MED ORDER — TECHNETIUM TC 99M SESTAMIBI GENERIC - CARDIOLITE
30.0000 | Freq: Once | INTRAVENOUS | Status: AC | PRN
  Administered 2012-05-08: 30 via INTRAVENOUS

## 2012-05-08 NOTE — Telephone Encounter (Signed)
[facility] states this has been taken care of Tobe Sos

## 2012-05-08 NOTE — Telephone Encounter (Signed)
I spoke with pt. This has been taking care of. See phone note 04/25/12. Nothing further was needed

## 2012-05-08 NOTE — Telephone Encounter (Signed)
LMTCB

## 2012-05-08 NOTE — Telephone Encounter (Signed)
Spoke with Pam @ [facility] who spoke with pt yesterday. Balance account had to be cleared per billing center. Account was paid yesterday, awaiting approval from billing center and per Pam patient is aware. Kandice Hams

## 2012-05-08 NOTE — Progress Notes (Signed)
[facility] SITE 3 NUCLEAR MED [address] [place], [place] [address] [phone]    Cardiology Nuclear Med Study  Renee [name] is a 78 y.o. female     MRN : [id number]     DOB: 1934/03/30  Procedure Date: 05/08/2012  Nuclear Med Background Indication for Stress Test:  Evaluation for Ischemia and Graft Patency History:  '11 MI; '11 CABG, EF=60%; '11 EF=60%; '12 MPS:no ischemia, EF=67%; h/o CHF Cardiac Risk Factors: Family History - CAD, History of Smoking, Hypertension, Lipids and RBBB  Symptoms:  Chest Tightness with and without Exertion (last episode of chest discomfort was about a week ago), Fatigue, Near Syncope, Palpitations, Rapid HR and SOB   Nuclear Pre-Procedure Caffeine/Decaff Intake:  None NPO After: 9:30pm   Lungs:  Coarse, but clear O2 Sat: 97% on room air. IV 0.9% NS with Angio Cath:  20g  IV Site: R Antecubital  IV Started by:  Stanton Kidney, EMT-P  Chest Size (in):  34 Cup Size: B  Height: 5\' 6"  (1.676 m)  Weight:  153 lb (69.4 kg)  BMI:  Body mass index is 24.71 kg/(m^2). Tech Comments:  med's were taken as directed, per patient    Nuclear Med Study 1 or 2 day study: 1 day  Stress Test Type:  Lexiscan  Reading MD: Marca Ancona, MD  Order Authorizing Provider:  Peter Swaziland, MD  Resting Radionuclide: Technetium 58m Sestamibi  Resting Radionuclide Dose: 11.0 mCi   Stress Radionuclide:  Technetium 80m Sestamibi  Stress Radionuclide Dose: 33.0 mCi           Stress Protocol Rest HR: 67 Stress HR: 93  Rest BP: 128/76 Stress BP: 133/80  Exercise Time (min): n/a METS: n/a   Predicted Max HR: 145 bpm % Max HR: 64.14 bpm Rate Pressure Product: 21308   Dose of Adenosine (mg):  n/a Dose of Lexiscan: 0.4 mg  Dose of Atropine (mg): n/a Dose of Dobutamine: n/a mcg/kg/min (at max HR)  Stress Test Technologist: Smiley Houseman, CMA-N  Nuclear Technologist:  Domenic Polite, CNMT     Rest Procedure:  Myocardial perfusion imaging was performed at rest  45 minutes following the intravenous administration of Technetium 16m Sestamibi.  Rest ECG: NSR-RBBB  Stress Procedure:  The patient received IV Lexiscan 0.4 mg over 15-seconds.  The patient c/o throat tightness with Lexiscan. Technetium 37m Sestamibi injected at 30-seconds.  Quantitative spect images were obtained after a 45 minute delay.  Stress ECG: No significant change from baseline ECG  QPS Raw Data Images:  Normal; no motion artifact; normal heart/lung ratio. Stress Images:  Normal homogeneous uptake in all areas of the myocardium. Rest Images:  Normal homogeneous uptake in all areas of the myocardium. Subtraction (SDS):  There is no evidence of scar or ischemia. Transient Ischemic Dilatation (Normal <1.22):  1.02 Lung/Heart Ratio (Normal <0.45):  0.26  Quantitative Gated Spect Images QGS EDV:  45 ml QGS ESV:  10 ml  Impression Exercise Capacity:  Lexiscan with no exercise. BP Response:  Normal blood pressure response. Clinical Symptoms:  Short of breath, headache.  ECG Impression:  No significant ST segment change suggestive of ischemia. Baseline RBBB.  Comparison with Prior Nuclear Study: No significant change from previous study  Overall Impression:  Normal stress nuclear study.  LV Ejection Fraction: 78%.  LV Wall Motion:  NL LV Function; NL Wall Motion  Marca Ancona 05/08/2012

## 2012-05-14 ENCOUNTER — Encounter: Payer: Self-pay | Admitting: Cardiology

## 2012-05-14 ENCOUNTER — Ambulatory Visit: Payer: Medicare Other | Admitting: Cardiology

## 2012-05-14 ENCOUNTER — Telehealth: Payer: Self-pay | Admitting: Critical Care Medicine

## 2012-05-14 ENCOUNTER — Ambulatory Visit (HOSPITAL_COMMUNITY): Payer: Medicare Other | Attending: Nurse Practitioner | Admitting: Radiology

## 2012-05-14 ENCOUNTER — Ambulatory Visit (INDEPENDENT_AMBULATORY_CARE_PROVIDER_SITE_OTHER): Payer: Medicare Other | Admitting: Cardiology

## 2012-05-14 VITALS — BP 134/72 | HR 87 | Ht 66.0 in | Wt 153.8 lb

## 2012-05-14 DIAGNOSIS — I2510 Atherosclerotic heart disease of native coronary artery without angina pectoris: Secondary | ICD-10-CM | POA: Diagnosis not present

## 2012-05-14 DIAGNOSIS — I4510 Unspecified right bundle-branch block: Secondary | ICD-10-CM

## 2012-05-14 DIAGNOSIS — R0600 Dyspnea, unspecified: Secondary | ICD-10-CM

## 2012-05-14 DIAGNOSIS — R079 Chest pain, unspecified: Secondary | ICD-10-CM | POA: Insufficient documentation

## 2012-05-14 DIAGNOSIS — I25810 Atherosclerosis of coronary artery bypass graft(s) without angina pectoris: Secondary | ICD-10-CM

## 2012-05-14 DIAGNOSIS — J449 Chronic obstructive pulmonary disease, unspecified: Secondary | ICD-10-CM

## 2012-05-14 DIAGNOSIS — I509 Heart failure, unspecified: Secondary | ICD-10-CM

## 2012-05-14 DIAGNOSIS — R0609 Other forms of dyspnea: Secondary | ICD-10-CM | POA: Diagnosis not present

## 2012-05-14 DIAGNOSIS — R0602 Shortness of breath: Secondary | ICD-10-CM | POA: Insufficient documentation

## 2012-05-14 NOTE — Telephone Encounter (Signed)
I spoke with pt and she stated we need to send order to [employer] to pick her O2 tanks. She stated [facility] is going to be bringing out her tanks tomorrow through them since she is changing companies. I have sent order to d[employer]c O2 through [employer]. Nothing further was needed

## 2012-05-14 NOTE — Progress Notes (Signed)
Renee [name] Date of Birth: [birth year] Medical Record #[id number]  History of Present Illness: Renee [name] is seen back today for followup. She has known CAD with past CABG in August of 2011. Other issues include chronic diastolic heart failure, COPD, sinusitis, GERD, RBBB, anxiety, HTN, HLD and hypothyroidism.  She was seen recently for evaluation of worsening dyspnea. There was concern that this may be an anginal equivalent symptom. She states that some days her breathing is bad and other days better. She is wearing her oxygen at night and when necessary during the day. She thinks that the pollen has made her breathing worse. She has chronic pain in her incisional site from her bypass surgery. She is fairly inactive and states she spent all winter inside. She has no edema.  Current Outpatient Prescriptions on File Prior to Visit  Medication Sig Dispense Refill  . Aclidinium Bromide (TUDORZA PRESSAIR) 400 MCG/ACT AEPB Inhale 1 puff into the lungs 2 (two) times daily.  1 each  3  . ALPRAZolam (XANAX) 0.25 MG tablet Take 1 tablet by mouth as needed.      [name] aspirin EC 81 MG tablet Take 81 mg by mouth daily.        . Cyanocobalamin (VITAMIN B 12) 100 MCG LOZG Inject into the skin every 30 (thirty) days.      [name] esomeprazole (NEXIUM) 40 MG capsule Take 40 mg by mouth daily before breakfast.        . fluticasone (FLONASE) 50 MCG/ACT nasal spray Place 1 spray into the nose daily.      . furosemide (LASIX) 40 MG tablet TAKE 1 TABLET BY MOUTH TWICE DAILY  60 tablet  5  . ketorolac (ACULAR) 0.5 % ophthalmic solution Place 1 drop into the left eye daily. When remebers      . levothyroxine (SYNTHROID, LEVOTHROID) 25 MCG tablet Take 1 tablet by mouth daily.      . metoprolol tartrate (LOPRESSOR) 25 MG tablet Take 1 tablet (25 mg total) by mouth 2 (two) times daily.  60 tablet  4  . NITROSTAT 0.4 MG SL tablet PLACE 1 TABLET UNDER THE TONGUE EVERY 5 MINUTES AS NEEDED FOR CHEST PAIN  25 tablet  0  . NON  FORMULARY as needed. Oxygen - 3L pulse with extertion, 2L continuious QHS.      Bertram Gala Glycol-Propyl Glycol (SYSTANE OP) Apply 1 drop to eye daily.        . potassium chloride SA (K-DUR,KLOR-CON) 20 MEQ tablet Take 1 tablet (20 mEq total) by mouth 2 (two) times daily.  60 tablet  6  . PROAIR HFA 108 (90 BASE) MCG/ACT inhaler Inhale 2 puffs into the lungs Every 6 hours as needed.      . traMADol (ULTRAM) 50 MG tablet Take 1 tablet (50 mg total) by mouth every 6 (six) hours as needed. Pain  30 tablet  0  . VITAMIN D, CHOLECALCIFEROL, PO Take 1 tablet by mouth daily.        . [DISCONTINUED] budesonide-formoterol (SYMBICORT) 160-4.5 MCG/ACT inhaler Inhale 2 puffs into the lungs 2 (two) times daily.  1 Inhaler  12  . [DISCONTINUED] calcium citrate-vitamin D (CITRACAL+D) 315-200 MG-UNIT per tablet Take 1 tablet by mouth 2 (two) times daily.         No current facility-administered medications on file prior to visit.    Allergies  Allergen Reactions  . Statins     Legs hurt  . Levofloxacin Nausea Only    Unable to  tolerate more than 7days  . Budesonide-Formoterol Fumarate     Tachycardia  . Morphine     REACTION: double vision  . Penicillins     REACTION: swelling/rash  . Phenazopyridine Hcl     *piridium* REACTION: face red  . Pneumococcal Vaccine     REACTION: rash and cough  . Pneumococcal Vaccines Swelling    Redding of the skin  . Prednisone     REACTION: rash  . Prednisone Swelling  . Sulfonamide Derivatives     REACTION: rash  . Tylenol (Acetaminophen)     Tylenol #3 -- rash and swelling  . Penicillins Swelling and Rash  . Sulfa Antibiotics Rash    Past Medical History  Diagnosis Date  . GERD (gastroesophageal reflux disease)   . IBS (irritable bowel syndrome)   . Bundle branch block, right   . Coronary artery disease     prior CABG in August of 2011  . Emphysema/COPD   . Fatigue   . Joint pain   . Anxiety   . AF (atrial fibrillation)     Post op after CABG;  off amiodarone  . HTN (hypertension)   . CHF (congestive heart failure)   . Dysphagia   . Colon polyps   . Hypertension   . Hypercholesteremia   . Normal nuclear stress test October 2012  . Myocardial infarction   . Hypothyroidism     Past Surgical History  Procedure Laterality Date  . Coronary artery bypass graft    . Total hip arthroplasty  11/01/2008    right  . Appendectomy  1995  . Cholecystectomy  2008    LARYNX  . Tonsillectomy and adenoidectomy    . Total abdominal hysterectomy  1986  . Cardiac surgery    . Joint replacement      Right Hip 2010    History  Smoking status  . Former Smoker -- 1.00 packs/day for 50 years  . Types: Cigarettes  . Quit date: 09/21/2009  Smokeless tobacco  . Never Used    History  Alcohol Use No    Family History  Problem Relation Age of Onset  . Heart disease Mother   . Transient ischemic attack Mother   . Hypertension Mother   . Deep vein thrombosis Mother   . Heart attack Father   . Hypertension Father   . Diabetes Father   . Heart disease Father     Heart disease before age 25  . Hyperlipidemia Father   . Breast cancer Sister   . Cancer Sister   . Other Sister     brain tumor  . Colon polyps Brother   . Hypertension Brother   . Colon cancer Neg Hx     Review of Systems: The review of systems is per the HPI.  All other systems were reviewed and are negative.  Physical Exam: BP 134/72  Pulse 87  Ht 5\' 6"  (1.676 m)  Wt 153 lb 12.8 oz (69.763 kg)  BMI 24.84 kg/m2  SpO2 98% Patient is alert and in no acute distress.  Skin is warm and dry. Color is normal.  HEENT is unremarkable. Normocephalic/atraumatic. PERRL. Sclera are nonicteric. Neck is supple. No masses. No JVD. Lungs are clear without wheezes or rales. Cardiac exam shows a regular rate and rhythm. Sternum is tender to palpation. There is keloid formation.  Abdomen is soft. Extremities are without edema. Gait and ROM are intact. No gross neurologic deficits  noted.   LABORATORY DATA: N/A  Lab Results  Component Value Date   WBC 9.8 03/17/2011   HGB 13.4 03/17/2011   HCT 43.3 03/17/2011   PLT 217 09/25/2009   GLUCOSE 86 12/26/2010   CHOL  Value: 207        ATP III CLASSIFICATION:  <200     mg/dL   Desirable  161-096  mg/dL   Borderline High  >=045    mg/dL   High* 05/14/8117   TRIG 116 07/16/2007   HDL 45 07/16/2007   LDLCALC  Value: 139        Total Cholesterol/HDL:CHD Risk Coronary Heart Disease Risk Table                     Men   Women  1/2 Average Risk   3.4   3.3* 07/16/2007   ALT 15 09/21/2009   AST 23 09/21/2009   NA 140 12/26/2010   K 4.0 12/26/2010   CL 100 12/26/2010   CREATININE 1.1 12/26/2010   BUN 14 12/26/2010   CO2 29 12/26/2010   TSH 0.756 09/22/2009   INR 1.22 09/21/2009   HGBA1C  Value: 5.7 (NOTE)                                                                       According to the ADA Clinical Practice Recommendations for 2011, when HbA1c is used as a screening test:   >=6.5%   Diagnostic of Diabetes Mellitus           (if abnormal result  is confirmed)  5.7-6.4%   Increased risk of developing Diabetes Mellitus  References:Diagnosis and Classification of Diabetes Mellitus,Diabetes Care,2011,34(Suppl 1):S62-S69 and Standards of Medical Care in         Diabetes - 2011,Diabetes Care,2011,34  (Suppl 1):S11-S61.* 09/19/2009   Stress Myoview study is normal. No ischemia or scar. Ejection fraction 78%.  Echo:Study Conclusions  - Left ventricle: The cavity size was normal. There was mild focal basal hypertrophy of the septum. Systolic function was normal. The estimated ejection fraction was in the range of 55% to 60%. Regional wall motion abnormalities cannot be excluded. Doppler parameters are consistent with abnormal left ventricular relaxation (grade 1 diastolic dysfunction). - Aortic valve: Trivial regurgitation. - Mitral valve: Mild regurgitation. - Pulmonary arteries: PA peak pressure: 32mm Hg (S). - Pericardium, extracardiac:  A trivial pericardial effusion was identified.    Assessment / Plan: 1. CAD - with past CABG -  no ischemia noted on Myoview study.   2. Dyspnea - echocardiogram and Myoview study were unremarkable. I suspect her shortness of breath his more related to her COPD. Probably exacerbated by recent allergens.   3. Palpitations. Currently well-controlled.  4. Hypertension.  5. COPD. Follow up with Dr. Delford Field.

## 2012-05-14 NOTE — Telephone Encounter (Signed)
Duplicate message. 

## 2012-05-14 NOTE — Patient Instructions (Signed)
Continue your current therapy  We will call with your Echo results.  I will see you in 6 months.

## 2012-05-14 NOTE — Progress Notes (Signed)
Echocardiogram performed.  

## 2012-05-29 DIAGNOSIS — H43399 Other vitreous opacities, unspecified eye: Secondary | ICD-10-CM | POA: Diagnosis not present

## 2012-05-29 DIAGNOSIS — H1045 Other chronic allergic conjunctivitis: Secondary | ICD-10-CM | POA: Diagnosis not present

## 2012-05-29 DIAGNOSIS — H35379 Puckering of macula, unspecified eye: Secondary | ICD-10-CM | POA: Diagnosis not present

## 2012-05-29 DIAGNOSIS — H04129 Dry eye syndrome of unspecified lacrimal gland: Secondary | ICD-10-CM | POA: Diagnosis not present

## 2012-06-02 ENCOUNTER — Encounter: Payer: Self-pay | Admitting: Critical Care Medicine

## 2012-06-02 ENCOUNTER — Ambulatory Visit (HOSPITAL_BASED_OUTPATIENT_CLINIC_OR_DEPARTMENT_OTHER)
Admission: RE | Admit: 2012-06-02 | Discharge: 2012-06-02 | Disposition: A | Discharge status: Home / Self Care | Payer: Medicare Other | Source: Ambulatory Visit | Attending: Critical Care Medicine | Admitting: Critical Care Medicine

## 2012-06-02 ENCOUNTER — Ambulatory Visit (INDEPENDENT_AMBULATORY_CARE_PROVIDER_SITE_OTHER): Payer: Medicare Other | Admitting: Critical Care Medicine

## 2012-06-02 VITALS — BP 116/72 | HR 88 | Temp 97.5°F | Ht 66.0 in | Wt 154.0 lb

## 2012-06-02 DIAGNOSIS — J441 Chronic obstructive pulmonary disease with (acute) exacerbation: Secondary | ICD-10-CM | POA: Diagnosis not present

## 2012-06-02 DIAGNOSIS — J309 Allergic rhinitis, unspecified: Secondary | ICD-10-CM | POA: Diagnosis not present

## 2012-06-02 DIAGNOSIS — I729 Aneurysm of unspecified site: Secondary | ICD-10-CM | POA: Diagnosis not present

## 2012-06-02 DIAGNOSIS — J449 Chronic obstructive pulmonary disease, unspecified: Secondary | ICD-10-CM

## 2012-06-02 DIAGNOSIS — I4891 Unspecified atrial fibrillation: Secondary | ICD-10-CM | POA: Diagnosis not present

## 2012-06-02 DIAGNOSIS — Z951 Presence of aortocoronary bypass graft: Secondary | ICD-10-CM | POA: Diagnosis not present

## 2012-06-02 DIAGNOSIS — I10 Essential (primary) hypertension: Secondary | ICD-10-CM | POA: Diagnosis not present

## 2012-06-02 DIAGNOSIS — I509 Heart failure, unspecified: Secondary | ICD-10-CM | POA: Insufficient documentation

## 2012-06-02 DIAGNOSIS — R0602 Shortness of breath: Secondary | ICD-10-CM | POA: Diagnosis not present

## 2012-06-02 DIAGNOSIS — I2510 Atherosclerotic heart disease of native coronary artery without angina pectoris: Secondary | ICD-10-CM | POA: Diagnosis not present

## 2012-06-02 DIAGNOSIS — J4489 Other specified chronic obstructive pulmonary disease: Secondary | ICD-10-CM

## 2012-06-02 MED ORDER — FAMOTIDINE 40 MG PO TABS: 40.0000 mg | ORAL_TABLET | Freq: Every evening | ORAL | Status: DC

## 2012-06-02 MED ORDER — MOMETASONE FURO-FORMOTEROL FUM 200-5 MCG/ACT IN AERO: 2.0000 | INHALATION_SPRAY | Freq: Two times a day (BID) | RESPIRATORY_TRACT | Status: DC

## 2012-06-02 MED ORDER — METHYLPREDNISOLONE ACETATE 80 MG/ML IJ SUSP
120.0000 mg | Freq: Once | INTRAMUSCULAR | Status: AC
  Administered 2012-06-02: 120 mg via INTRAMUSCULAR

## 2012-06-02 MED ORDER — AZELASTINE-FLUTICASONE 137-50 MCG/ACT NA SUSP: 1.0000 | Freq: Two times a day (BID) | NASAL | Status: DC

## 2012-06-02 NOTE — Progress Notes (Signed)
Subjective:    Patient ID: Renee [name], female    DOB: [birth year], 78 y.o.   MRN: [id number]  HPI  78 y.o.   female with known hx of COPD -former smoker -Darla Lesches    06/02/2012 At last ov we rec: Gold stage C. COPD with recurrent exacerbation due to ongoing sinusitis Plan Take Cefdinir 600mg  daily for 10days Obtain a Ct sinus >>>neg for sinusitis Referral back to Cardiology Dr Swaziland ?angina equivalent Note myoview NEG for ischemia  Now: more cough, mucus is clear, occ dark mucus. More dyspneic.  Stayed in bed.  Not having the chest pain No fever.  Abx last time did help.   Pt notes more sneezing, notes some pndrip and throat dry scratchy Hx of pollen allergies    Past Medical History  Diagnosis Date  . GERD (gastroesophageal reflux disease)   . IBS (irritable bowel syndrome)   . Bundle branch block, right   . Coronary artery disease     prior CABG in August of 2011  . Emphysema/COPD   . Fatigue   . Joint pain   . Anxiety   . AF (atrial fibrillation)     Post op after CABG; off amiodarone  . HTN (hypertension)   . CHF (congestive heart failure)   . Dysphagia   . Colon polyps   . Hypertension   . Hypercholesteremia   . Normal nuclear stress test October 2012  . Myocardial infarction   . Hypothyroidism      Family History  Problem Relation Age of Onset  . Heart disease Mother   . Transient ischemic attack Mother   . Hypertension Mother   . Deep vein thrombosis Mother   . Heart attack Father   . Hypertension Father   . Diabetes Father   . Heart disease Father     Heart disease before age 2  . Hyperlipidemia Father   . Breast cancer Sister   . Cancer Sister   . Other Sister     brain tumor  . Colon polyps Brother   . Hypertension Brother   . Colon cancer Neg Hx      History   Social History  . Marital Status: Married    Spouse Name: N/A    Number of Children: 1  . Years of Education: N/A   Occupational History  . nurse     retired    Social History Main Topics  . Smoking status: Former Smoker -- 1.00 packs/day for 50 years    Types: Cigarettes    Quit date: 09/21/2009  . Smokeless tobacco: Never Used  . Alcohol Use: No  . Drug Use: No  . Sexually Active: Not on file   Other Topics Concern  . Not on file   Social History Narrative   ** Merged History Encounter **       Daily caffeine use: Dr Reino Kent and 2 cups coffee per day     Allergies  Allergen Reactions  . Statins     Legs hurt  . Levofloxacin Nausea Only    Unable to tolerate more than 7days  . Budesonide-Formoterol Fumarate     Tachycardia  . Morphine     REACTION: double vision  . Penicillins     REACTION: swelling/rash  . Phenazopyridine Hcl     *piridium* REACTION: face red  . Pneumococcal Vaccine     REACTION: rash and cough  . Pneumococcal Vaccines Swelling    Redding of the skin  .  Prednisone     REACTION: rash  . Prednisone Swelling  . Sulfonamide Derivatives     REACTION: rash  . Tylenol (Acetaminophen)     Tylenol #3 -- rash and swelling  . Penicillins Swelling and Rash  . Sulfa Antibiotics Rash     Outpatient Prescriptions Prior to Visit  Medication Sig Dispense Refill  . ALPRAZolam (XANAX) 0.25 MG tablet Take 1 tablet by mouth as needed.      [name] aspirin EC 81 MG tablet Take 81 mg by mouth daily.        . Cyanocobalamin (VITAMIN B 12) 100 MCG LOZG Inject into the skin every 30 (thirty) days.      [name] esomeprazole (NEXIUM) 40 MG capsule Take 40 mg by mouth daily before breakfast.        . furosemide (LASIX) 40 MG tablet TAKE 1 TABLET BY MOUTH TWICE DAILY  60 tablet  5  . levothyroxine (SYNTHROID, LEVOTHROID) 25 MCG tablet Take 1 tablet by mouth daily.      . metoprolol tartrate (LOPRESSOR) 25 MG tablet Take 1 tablet (25 mg total) by mouth 2 (two) times daily.  60 tablet  4  . NITROSTAT 0.4 MG SL tablet PLACE 1 TABLET UNDER THE TONGUE EVERY 5 MINUTES AS NEEDED FOR CHEST PAIN  25 tablet  0  . NON FORMULARY as needed. Oxygen -  3L pulse with extertion, 2L continuious QHS.      Bertram Gala Glycol-Propyl Glycol (SYSTANE OP) Apply 1 drop to eye daily.        . potassium chloride SA (K-DUR,KLOR-CON) 20 MEQ tablet Take 1 tablet (20 mEq total) by mouth 2 (two) times daily.  60 tablet  6  . PROAIR HFA 108 (90 BASE) MCG/ACT inhaler Inhale 2 puffs into the lungs Every 6 hours as needed.      . traMADol (ULTRAM) 50 MG tablet Take 1 tablet (50 mg total) by mouth every 6 (six) hours as needed. Pain  30 tablet  0  . VITAMIN D, CHOLECALCIFEROL, PO Take 1 tablet by mouth daily.        . Aclidinium Bromide (TUDORZA PRESSAIR) 400 MCG/ACT AEPB Inhale 1 puff into the lungs 2 (two) times daily.  1 each  3  . fluticasone (FLONASE) 50 MCG/ACT nasal spray Place 1 spray into the nose daily.      [name] ketorolac (ACULAR) 0.5 % ophthalmic solution Place 1 drop into the left eye daily. When remebers       No facility-administered medications prior to visit.     Review of Systems  Constitutional:   No  weight loss, night sweats,  Fevers, chills, + fatigue, or  lassitude.  HEENT:   No headaches,  Difficulty swallowing,  Tooth/dental problems, or  Sore throat,                No sneezing, itching, ear ache,+++ nasal congestion,+++ post nasal drip,   CV:   Orthopnea, PND, swelling in lower extremities, anasarca, dizziness, palpitations, syncope.   GI  No heartburn, indigestion, abdominal pain, nausea, vomiting, diarrhea, change in bowel habits, loss of appetite, bloody stools.   Resp:    No coughing up of blood.    No chest wall deformity  Skin: no rash or lesions.  GU: no dysuria, change in color of urine, no urgency or frequency.  No flank pain, no hematuria   MS:  No joint pain or swelling.  No decreased range of motion.   Psych:  No change  in mood or affect. No depression or anxiety.            Objective:   Physical Exam BP 116/72  Pulse 88  Temp(Src) 97.5 F (36.4 C) (Oral)  Ht 5\' 6"  (1.676 m)  Wt 154 lb (69.854 kg)  BMI  24.87 kg/m2  SpO2 93%  GEN: A/Ox3; pleasant , NAD, elderly  HEENT:  [place]/AT,  EACs-clear, TMs-wnl, NOSE purulent  drainage THROAT postnasal drainage of purulent material  NECK:  Supple w/ fair ROM; no JVD; normal carotid impulses w/o bruits; no thyromegaly or nodules palpated; no lymphadenopathy.  RESP  Distant BS no accessory muscle use, no dullness to percussion  CARD:  RRR, no m/r/g  , no peripheral edema, pulses intact, no cyanosis or clubbing.  GI:   Soft & nt; nml bowel sounds; no organomegaly or masses detected.  Musco: Warm bil, no deformities or joint swelling noted.   Neuro: alert, no focal deficits noted.    Skin: Warm, no lesions or rashes  Dg Chest 2 View  06/02/2012  *RADIOLOGY REPORT*  Clinical Data: Shortness of breath, COPD exacerbation.  CHEST - 2 VIEW  Comparison: November 05, 2011.  Findings: Sternotomy wires are noted.  Cardiomediastinal silhouette appears normal.  Right lung is clear.  No change is seen involving linear opacity in lingular region consistent with scarring.  IMPRESSION: No acute cardiopulmonary abnormality seen.   Original Report Authenticated By: Lupita Raider.,  M.D.           Assessment & Plan:  COPD Gold C COPD gold stage C. With recent exacerbation now with ongoing flair do to allergic rhinitis Plan A depo medrol 120mg  injection was given Dymista one puff each nostril twice daily and stop flonase Stop [employer] two puff twice daily, use samples Start zyrtec or chlorpheniramine daily for allergies USe Mucinex DM or Delsym as needed for cough Follow Strict reflux diet Add Pepcid 40mg  every night , stay on nexium before breakfast, sent to your local pharmacy  Return 3 weeks [facility] office         Updated Medication List Outpatient Encounter Prescriptions as of 06/02/2012  Medication Sig Dispense Refill  . ALPRAZolam (XANAX) 0.25 MG tablet Take 1 tablet by mouth as needed.      [name] aspirin EC 81 MG tablet Take 81 mg by  mouth daily.        . Cyanocobalamin (VITAMIN B 12) 100 MCG LOZG Inject into the skin every 30 (thirty) days.      [name] esomeprazole (NEXIUM) 40 MG capsule Take 40 mg by mouth daily before breakfast.        . furosemide (LASIX) 40 MG tablet TAKE 1 TABLET BY MOUTH TWICE DAILY  60 tablet  5  . levothyroxine (SYNTHROID, LEVOTHROID) 25 MCG tablet Take 1 tablet by mouth daily.      . metoprolol tartrate (LOPRESSOR) 25 MG tablet Take 1 tablet (25 mg total) by mouth 2 (two) times daily.  60 tablet  4  . NITROSTAT 0.4 MG SL tablet PLACE 1 TABLET UNDER THE TONGUE EVERY 5 MINUTES AS NEEDED FOR CHEST PAIN  25 tablet  0  . NON FORMULARY as needed. Oxygen - 3L pulse with extertion, 2L continuious QHS.      Bertram Gala Glycol-Propyl Glycol (SYSTANE OP) Apply 1 drop to eye daily.        . potassium chloride SA (K-DUR,KLOR-CON) 20 MEQ tablet Take 1 tablet (20 mEq total) by mouth 2 (two) times daily.  60 tablet  6  . PROAIR HFA 108 (90 BASE) MCG/ACT inhaler Inhale 2 puffs into the lungs Every 6 hours as needed.      . traMADol (ULTRAM) 50 MG tablet Take 1 tablet (50 mg total) by mouth every 6 (six) hours as needed. Pain  30 tablet  0  . VITAMIN D, CHOLECALCIFEROL, PO Take 1 tablet by mouth daily.        . [DISCONTINUED] Aclidinium Bromide (TUDORZA PRESSAIR) 400 MCG/ACT AEPB Inhale 1 puff into the lungs 2 (two) times daily.  1 each  3  . [DISCONTINUED] fluticasone (FLONASE) 50 MCG/ACT nasal spray Place 1 spray into the nose daily.      . Azelastine-Fluticasone (DYMISTA) 137-50 MCG/ACT SUSP Place 1 puff into the nose 2 (two) times daily.  6 g  2  . famotidine (PEPCID) 40 MG tablet Take 1 tablet (40 mg total) by mouth every evening.  30 tablet  1  . mometasone-formoterol (DULERA) 200-5 MCG/ACT AERO Inhale 2 puffs into the lungs 2 (two) times daily.  1 Inhaler  1  . [DISCONTINUED] ketorolac (ACULAR) 0.5 % ophthalmic solution Place 1 drop into the left eye daily. When remebers      . [EXPIRED] methylPREDNISolone acetate  (DEPO-MEDROL) injection 120 mg        No facility-administered encounter medications on file as of 06/02/2012.

## 2012-06-02 NOTE — Patient Instructions (Addendum)
A depo medrol 120mg  injection was given Dymista one puff each nostril twice daily and stop flonase Stop [employer] two puff twice daily, use samples Start zyrtec or chlorpheniramine daily for allergies USe Mucinex DM or Delsym as needed for cough Follow Strict reflux diet Add Pepcid 40mg  every night , stay on nexium before breakfast, sent to your local pharmacy Chest xray today  Return 3 weeks [facility]

## 2012-06-02 NOTE — Progress Notes (Signed)
Quick Note:  Notify the patient that the Xray is stable and no pneumonia No change in medications are recommended. Continue current meds as prescribed at last office visit ______ 

## 2012-06-02 NOTE — Assessment & Plan Note (Signed)
COPD gold stage C. With recent exacerbation now with ongoing flair do to allergic rhinitis Plan A depo medrol 120mg  injection was given Dymista one puff each nostril twice daily and stop flonase Stop [employer] two puff twice daily, use samples Start zyrtec or chlorpheniramine daily for allergies USe Mucinex DM or Delsym as needed for cough Follow Strict reflux diet Add Pepcid 40mg  every night , stay on nexium before breakfast, sent to your local pharmacy  Return 3 weeks [facility]

## 2012-06-03 NOTE — Progress Notes (Signed)
Quick Note:  Called, spoke with pt. Informed her of cxr results and recs per Dr. Wright. She verbalized understanding. ______ 

## 2012-06-18 DIAGNOSIS — Z1231 Encounter for screening mammogram for malignant neoplasm of breast: Secondary | ICD-10-CM | POA: Diagnosis not present

## 2012-06-24 ENCOUNTER — Ambulatory Visit (INDEPENDENT_AMBULATORY_CARE_PROVIDER_SITE_OTHER): Payer: Medicare Other | Admitting: Critical Care Medicine

## 2012-06-24 ENCOUNTER — Encounter: Payer: Self-pay | Admitting: Critical Care Medicine

## 2012-06-24 VITALS — BP 130/76 | HR 73 | Temp 97.4°F | Ht 65.0 in | Wt 157.2 lb

## 2012-06-24 DIAGNOSIS — J449 Chronic obstructive pulmonary disease, unspecified: Secondary | ICD-10-CM

## 2012-06-24 DIAGNOSIS — E538 Deficiency of other specified B group vitamins: Secondary | ICD-10-CM | POA: Diagnosis not present

## 2012-06-24 DIAGNOSIS — J4489 Other specified chronic obstructive pulmonary disease: Secondary | ICD-10-CM

## 2012-06-24 MED ORDER — AZELASTINE-FLUTICASONE 137-50 MCG/ACT NA SUSP: 1.0000 | Freq: Two times a day (BID) | NASAL | Status: DC

## 2012-06-24 MED ORDER — MOMETASONE FURO-FORMOTEROL FUM 200-5 MCG/ACT IN AERO: 2.0000 | INHALATION_SPRAY | Freq: Two times a day (BID) | RESPIRATORY_TRACT | Status: DC

## 2012-06-24 NOTE — Patient Instructions (Addendum)
Stay on dymista and dulera>>refills sent to pharmacy You may be off oxygen during day , use at night and as needed daytime Return 4 months

## 2012-06-24 NOTE — Assessment & Plan Note (Signed)
Gold stage C. COPD stable at this time note patient is much improved on Dulera and off Symbicort Patient also is improved on Dymista Plan No change in inhaled or maintenance medications.

## 2012-06-24 NOTE — Progress Notes (Signed)
Subjective:    Patient ID: Renee [name], female    DOB: 13-Jul-1934, 78 y.o.   MRN: [id number]  HPI  78 y.o.   female with known hx of COPD -former smoker -Darla Lesches   06/24/2012 A depo medrol 120mg  injection was given Dymista one puff each nostril twice daily and stop flonase Stop [employer] two puff twice daily, use samples Dymista helped.  Off tudorza and on [place]. Now is better.  No real cough.  Less dyspnea. No qhs dyspnea.     Past Medical History  Diagnosis Date  . GERD (gastroesophageal reflux disease)   . IBS (irritable bowel syndrome)   . Bundle branch block, right   . Coronary artery disease     prior CABG in August of 2011  . Emphysema/COPD   . Fatigue   . Joint pain   . Anxiety   . AF (atrial fibrillation)     Post op after CABG; off amiodarone  . HTN (hypertension)   . CHF (congestive heart failure)   . Dysphagia   . Colon polyps   . Hypertension   . Hypercholesteremia   . Normal nuclear stress test October 2012  . Myocardial infarction   . Hypothyroidism      Family History  Problem Relation Age of Onset  . Heart disease Mother   . Transient ischemic attack Mother   . Hypertension Mother   . Deep vein thrombosis Mother   . Heart attack Father   . Hypertension Father   . Diabetes Father   . Heart disease Father     Heart disease before age 22  . Hyperlipidemia Father   . Breast cancer Sister   . Cancer Sister   . Other Sister     brain tumor  . Colon polyps Brother   . Hypertension Brother   . Colon cancer Neg Hx      History   Social History  . Marital Status: Married    Spouse Name: N/A    Number of Children: 1  . Years of Education: N/A   Occupational History  . nurse     retired   Social History Main Topics  . Smoking status: Former Smoker -- 1.00 packs/day for 50 years    Types: Cigarettes    Quit date: 09/21/2009  . Smokeless tobacco: Never Used  . Alcohol Use: No  . Drug Use: No  . Sexually Active: Not  on file   Other Topics Concern  . Not on file   Social History Narrative   ** Merged History Encounter **       Daily caffeine use: Dr Reino Kent and 2 cups coffee per day     Allergies  Allergen Reactions  . Statins     Legs hurt  . Levofloxacin Nausea Only    Unable to tolerate more than 7days  . Budesonide-Formoterol Fumarate     Tachycardia  . Morphine     REACTION: double vision  . Penicillins     REACTION: swelling/rash  . Phenazopyridine Hcl     *piridium* REACTION: face red  . Pneumococcal Vaccine     REACTION: rash and cough  . Pneumococcal Vaccines Swelling    Redding of the skin  . Prednisone     REACTION: rash  . Prednisone Swelling  . Sulfonamide Derivatives     REACTION: rash  . Tylenol (Acetaminophen)     Tylenol #3 -- rash and swelling  . Penicillins Swelling and Rash  .  Sulfa Antibiotics Rash     Outpatient Prescriptions Prior to Visit  Medication Sig Dispense Refill  . ALPRAZolam (XANAX) 0.25 MG tablet Take 1 tablet by mouth as needed.      [name] aspirin EC 81 MG tablet Take 81 mg by mouth daily.        . Azelastine-Fluticasone (DYMISTA) 137-50 MCG/ACT SUSP Place 1 puff into the nose 2 (two) times daily.  6 g  2  . Cyanocobalamin (VITAMIN B 12) 100 MCG LOZG Inject into the skin every 30 (thirty) days.      [name] esomeprazole (NEXIUM) 40 MG capsule Take 40 mg by mouth daily before breakfast.        . furosemide (LASIX) 40 MG tablet TAKE 1 TABLET BY MOUTH TWICE DAILY  60 tablet  5  . levothyroxine (SYNTHROID, LEVOTHROID) 25 MCG tablet Take 1 tablet by mouth daily.      . metoprolol tartrate (LOPRESSOR) 25 MG tablet Take 1 tablet (25 mg total) by mouth 2 (two) times daily.  60 tablet  4  . mometasone-formoterol (DULERA) 200-5 MCG/ACT AERO Inhale 2 puffs into the lungs 2 (two) times daily.  1 Inhaler  1  . NITROSTAT 0.4 MG SL tablet PLACE 1 TABLET UNDER THE TONGUE EVERY 5 MINUTES AS NEEDED FOR CHEST PAIN  25 tablet  0  . NON FORMULARY as needed. Oxygen - 3L pulse  with extertion, 2L continuious QHS.      Bertram Gala Glycol-Propyl Glycol (SYSTANE OP) Apply 1 drop to eye daily.        . potassium chloride SA (K-DUR,KLOR-CON) 20 MEQ tablet Take 1 tablet (20 mEq total) by mouth 2 (two) times daily.  60 tablet  6  . PROAIR HFA 108 (90 BASE) MCG/ACT inhaler Inhale 2 puffs into the lungs Every 6 hours as needed.      . traMADol (ULTRAM) 50 MG tablet Take 1 tablet (50 mg total) by mouth every 6 (six) hours as needed. Pain  30 tablet  0  . VITAMIN D, CHOLECALCIFEROL, PO Take 1 tablet by mouth daily.        . famotidine (PEPCID) 40 MG tablet Take 1 tablet (40 mg total) by mouth every evening.  30 tablet  1   No facility-administered medications prior to visit.     Review of Systems  Constitutional:   No  weight loss, night sweats,  Fevers, chills, + fatigue, or  lassitude.  HEENT:   No headaches,  Difficulty swallowing,  Tooth/dental problems, or  Sore throat,                No sneezing, itching, ear ache,no nasal congestion,no post nasal drip,   CV:   Orthopnea, PND, swelling in lower extremities, anasarca, dizziness, palpitations, syncope.   GI  No heartburn, indigestion, abdominal pain, nausea, vomiting, diarrhea, change in bowel habits, loss of appetite, bloody stools.   Resp:    No coughing up of blood.    No chest wall deformity  Skin: no rash or lesions.  GU: no dysuria, change in color of urine, no urgency or frequency.  No flank pain, no hematuria   MS:  No joint pain or swelling.  No decreased range of motion.   Psych:  No change in mood or affect. No depression or anxiety.            Objective:   Physical Exam BP 130/76  Pulse 73  Temp(Src) 97.4 F (36.3 C) (Oral)  Ht 5\' 5"  (1.651 m)  Wt 157 lb 3.2 oz (71.305 kg)  BMI 26.16 kg/m2  SpO2 98%  GEN: A/Ox3; pleasant , NAD, elderly  HEENT:  Echo/AT,  EACs-clear, TMs-wnl, NOSE no evidence of inflammaiton    NECK:  Supple w/ fair ROM; no JVD; normal carotid impulses w/o bruits; no  thyromegaly or nodules palpated; no lymphadenopathy.  RESP  Distant BS no accessory muscle use, no dullness to percussion  CARD:  RRR, no m/r/g  , no peripheral edema, pulses intact, no cyanosis or clubbing.  GI:   Soft & nt; nml bowel sounds; no organomegaly or masses detected.  Musco: Warm bil, no deformities or joint swelling noted.   Neuro: alert, no focal deficits noted.    Skin: Warm, no lesions or rashes  No results found.        Assessment & Plan:  COPD Gold C Gold stage C. COPD stable at this time note patient is much improved on Dulera and off Symbicort Patient also is improved on Dymista Plan No change in inhaled or maintenance medications.        Updated Medication List Outpatient Encounter Prescriptions as of 06/24/2012  Medication Sig Dispense Refill  . ALPRAZolam (XANAX) 0.25 MG tablet Take 1 tablet by mouth as needed.      [name] aspirin EC 81 MG tablet Take 81 mg by mouth daily.        . Azelastine-Fluticasone (DYMISTA) 137-50 MCG/ACT SUSP Place 1 puff into the nose 2 (two) times daily.  6 g  2  . Cyanocobalamin (VITAMIN B 12) 100 MCG LOZG Inject into the skin every 30 (thirty) days.      [name] esomeprazole (NEXIUM) 40 MG capsule Take 40 mg by mouth daily before breakfast.        . furosemide (LASIX) 40 MG tablet TAKE 1 TABLET BY MOUTH TWICE DAILY  60 tablet  5  . levothyroxine (SYNTHROID, LEVOTHROID) 25 MCG tablet Take 1 tablet by mouth daily.      . metoprolol tartrate (LOPRESSOR) 25 MG tablet Take 1 tablet (25 mg total) by mouth 2 (two) times daily.  60 tablet  4  . mometasone-formoterol (DULERA) 200-5 MCG/ACT AERO Inhale 2 puffs into the lungs 2 (two) times daily.  1 Inhaler  1  . NITROSTAT 0.4 MG SL tablet PLACE 1 TABLET UNDER THE TONGUE EVERY 5 MINUTES AS NEEDED FOR CHEST PAIN  25 tablet  0  . NON FORMULARY as needed. Oxygen - 3L pulse with extertion, 2L continuious QHS.      Bertram Gala Glycol-Propyl Glycol (SYSTANE OP) Apply 1 drop to eye daily.        .  potassium chloride SA (K-DUR,KLOR-CON) 20 MEQ tablet Take 1 tablet (20 mEq total) by mouth 2 (two) times daily.  60 tablet  6  . PROAIR HFA 108 (90 BASE) MCG/ACT inhaler Inhale 2 puffs into the lungs Every 6 hours as needed.      . traMADol (ULTRAM) 50 MG tablet Take 1 tablet (50 mg total) by mouth every 6 (six) hours as needed. Pain  30 tablet  0  . VITAMIN D, CHOLECALCIFEROL, PO Take 1 tablet by mouth daily.        . [DISCONTINUED] famotidine (PEPCID) 40 MG tablet Take 1 tablet (40 mg total) by mouth every evening.  30 tablet  1   No facility-administered encounter medications on file as of 06/24/2012.

## 2012-06-25 ENCOUNTER — Other Ambulatory Visit: Payer: Self-pay

## 2012-06-25 MED ORDER — METOPROLOL TARTRATE 25 MG PO TABS: 25.0000 mg | ORAL_TABLET | Freq: Two times a day (BID) | ORAL | Status: DC

## 2012-06-26 ENCOUNTER — Telehealth: Payer: Self-pay | Admitting: *Deleted

## 2012-06-26 NOTE — Telephone Encounter (Signed)
Received Prior Auth request for dymista from [employer] on [facility] [employer]. Dymista PA initiated through [email or web address]. Will await response.

## 2012-07-17 NOTE — Telephone Encounter (Signed)
Called pt's plan at [phone], spoke with [place].  Was advised they did not receive the PA from cover my meds.  I completed this over the phone.   Dymista PA has been APPROVED from 06/17/12 - 07/17/2013. Case # is [id number].  Tresa Endo with [facility] aware. Pt aware.

## 2012-07-30 DIAGNOSIS — E538 Deficiency of other specified B group vitamins: Secondary | ICD-10-CM | POA: Diagnosis not present

## 2012-08-12 DIAGNOSIS — J449 Chronic obstructive pulmonary disease, unspecified: Secondary | ICD-10-CM | POA: Diagnosis not present

## 2012-08-12 DIAGNOSIS — R300 Dysuria: Secondary | ICD-10-CM | POA: Diagnosis not present

## 2012-08-12 DIAGNOSIS — Z6825 Body mass index (BMI) 25.0-25.9, adult: Secondary | ICD-10-CM | POA: Diagnosis not present

## 2012-08-12 DIAGNOSIS — J019 Acute sinusitis, unspecified: Secondary | ICD-10-CM | POA: Diagnosis not present

## 2012-09-05 ENCOUNTER — Other Ambulatory Visit: Payer: Self-pay | Admitting: *Deleted

## 2012-09-05 MED ORDER — POTASSIUM CHLORIDE CRYS ER 20 MEQ PO TBCR: 20.0000 meq | EXTENDED_RELEASE_TABLET | Freq: Two times a day (BID) | ORAL | Status: DC

## 2012-09-22 DIAGNOSIS — J019 Acute sinusitis, unspecified: Secondary | ICD-10-CM | POA: Diagnosis not present

## 2012-09-22 DIAGNOSIS — Z6825 Body mass index (BMI) 25.0-25.9, adult: Secondary | ICD-10-CM | POA: Diagnosis not present

## 2012-09-22 DIAGNOSIS — J309 Allergic rhinitis, unspecified: Secondary | ICD-10-CM | POA: Diagnosis not present

## 2012-09-22 DIAGNOSIS — R300 Dysuria: Secondary | ICD-10-CM | POA: Diagnosis not present

## 2012-09-22 DIAGNOSIS — D510 Vitamin B12 deficiency anemia due to intrinsic factor deficiency: Secondary | ICD-10-CM | POA: Diagnosis not present

## 2012-10-01 DIAGNOSIS — J329 Chronic sinusitis, unspecified: Secondary | ICD-10-CM | POA: Diagnosis not present

## 2012-10-17 ENCOUNTER — Other Ambulatory Visit: Payer: Self-pay | Admitting: *Deleted

## 2012-10-17 MED ORDER — NITROGLYCERIN 0.4 MG SL SUBL: SUBLINGUAL_TABLET | SUBLINGUAL | Status: DC

## 2012-11-11 DIAGNOSIS — H35379 Puckering of macula, unspecified eye: Secondary | ICD-10-CM | POA: Diagnosis not present

## 2012-11-11 DIAGNOSIS — H11009 Unspecified pterygium of unspecified eye: Secondary | ICD-10-CM | POA: Diagnosis not present

## 2012-11-11 DIAGNOSIS — H43819 Vitreous degeneration, unspecified eye: Secondary | ICD-10-CM | POA: Diagnosis not present

## 2012-11-13 ENCOUNTER — Ambulatory Visit (INDEPENDENT_AMBULATORY_CARE_PROVIDER_SITE_OTHER): Payer: Medicare Other | Admitting: Cardiology

## 2012-11-13 ENCOUNTER — Other Ambulatory Visit: Payer: Self-pay | Admitting: Dermatology

## 2012-11-13 ENCOUNTER — Encounter: Payer: Self-pay | Admitting: Cardiology

## 2012-11-13 VITALS — BP 139/70 | HR 55 | Ht 66.0 in | Wt 156.0 lb

## 2012-11-13 DIAGNOSIS — I2510 Atherosclerotic heart disease of native coronary artery without angina pectoris: Secondary | ICD-10-CM | POA: Diagnosis not present

## 2012-11-13 DIAGNOSIS — R002 Palpitations: Secondary | ICD-10-CM | POA: Diagnosis not present

## 2012-11-13 DIAGNOSIS — I4510 Unspecified right bundle-branch block: Secondary | ICD-10-CM | POA: Diagnosis not present

## 2012-11-13 DIAGNOSIS — B079 Viral wart, unspecified: Secondary | ICD-10-CM | POA: Diagnosis not present

## 2012-11-13 DIAGNOSIS — L820 Inflamed seborrheic keratosis: Secondary | ICD-10-CM | POA: Diagnosis not present

## 2012-11-13 DIAGNOSIS — Z23 Encounter for immunization: Secondary | ICD-10-CM | POA: Diagnosis not present

## 2012-11-13 DIAGNOSIS — I509 Heart failure, unspecified: Secondary | ICD-10-CM

## 2012-11-13 DIAGNOSIS — L910 Hypertrophic scar: Secondary | ICD-10-CM | POA: Diagnosis not present

## 2012-11-13 DIAGNOSIS — L821 Other seborrheic keratosis: Secondary | ICD-10-CM | POA: Diagnosis not present

## 2012-11-13 NOTE — Patient Instructions (Signed)
We will have you wear an event monitor.  Continue your current therapy   

## 2012-11-13 NOTE — Progress Notes (Addendum)
[name] Date of Birth: 1934-06-22 Medical Record #[id number]  History of Present Illness: Renee [name] is seen back today for followup. She has known CAD with past CABG in August of 2011. Other issues include chronic diastolic heart failure, COPD, sinusitis, GERD, RBBB, anxiety, HTN, HLD and hypothyroidism.  She had a Myoview study in April 2014 which was normal. An echocardiogram at that time demonstrated normal LV function. She had mild mitral insufficiency without evidence of prolapse.  On followup today she continues to experience symptoms of pain in her chest incision. It is worse in the lower sternal area. It is sharp and will suddenly catch her and then goes away spontaneously. She also has periodic attacks where her heart rate speeds up and then slows down. By pulse ox her heart rate will vary from 30-120 beats per minute. It may last up to an hour and a half to 3 hours at a time. She has no dizziness or syncope.  Current Outpatient Prescriptions on File Prior to Visit  Medication Sig Dispense Refill  . ALPRAZolam (XANAX) 0.25 MG tablet Take 1 tablet by mouth as needed.      [name] aspirin EC 81 MG tablet Take 81 mg by mouth daily.        . Cyanocobalamin (VITAMIN B 12) 100 MCG LOZG Inject into the skin every 30 (thirty) days.      [name] esomeprazole (NEXIUM) 40 MG capsule Take 40 mg by mouth daily before breakfast.        . furosemide (LASIX) 40 MG tablet TAKE 1 TABLET BY MOUTH TWICE DAILY  60 tablet  5  . levothyroxine (SYNTHROID, LEVOTHROID) 25 MCG tablet Take 1 tablet by mouth daily.      . metoprolol tartrate (LOPRESSOR) 25 MG tablet Take 1 tablet (25 mg total) by mouth 2 (two) times daily.  60 tablet  4  . nitroGLYCERIN (NITROSTAT) 0.4 MG SL tablet PLACE 1 TABLET UNDER THE TONGUE EVERY 5 MINUTES AS NEEDED FOR CHEST PAIN  25 tablet  3  . NON FORMULARY as needed. Oxygen - 3L pulse with extertion, 2L continuious QHS.      Bertram Gala Glycol-Propyl Glycol (SYSTANE OP) Apply 1 drop to eye  daily.        . potassium chloride SA (K-DUR,KLOR-CON) 20 MEQ tablet Take 1 tablet (20 mEq total) by mouth 2 (two) times daily.  60 tablet  6  . PROAIR HFA 108 (90 BASE) MCG/ACT inhaler Inhale 2 puffs into the lungs Every 6 hours as needed.      . traMADol (ULTRAM) 50 MG tablet Take 1 tablet (50 mg total) by mouth every 6 (six) hours as needed. Pain  30 tablet  0  . VITAMIN D, CHOLECALCIFEROL, PO Take 1 tablet by mouth daily.        . [DISCONTINUED] budesonide-formoterol (SYMBICORT) 160-4.5 MCG/ACT inhaler Inhale 2 puffs into the lungs 2 (two) times daily.  1 Inhaler  12  . [DISCONTINUED] calcium citrate-vitamin D (CITRACAL+D) 315-200 MG-UNIT per tablet Take 1 tablet by mouth 2 (two) times daily.         No current facility-administered medications on file prior to visit.    Allergies  Allergen Reactions  . Statins     Legs hurt  . Levofloxacin Nausea Only    Unable to tolerate more than 7days  . Budesonide-Formoterol Fumarate     Tachycardia  . Morphine     REACTION: double vision  . Penicillins     REACTION: swelling/rash  .  Phenazopyridine Hcl     *piridium* REACTION: face red  . Pneumococcal Vaccine     REACTION: rash and cough  . Pneumococcal Vaccines Swelling    Redding of the skin  . Prednisone     REACTION: rash  . Prednisone Swelling  . Sulfonamide Derivatives     REACTION: rash  . Tylenol [Acetaminophen]     Tylenol #3 -- rash and swelling  . Penicillins Swelling and Rash  . Sulfa Antibiotics Rash    Past Medical History  Diagnosis Date  . GERD (gastroesophageal reflux disease)   . IBS (irritable bowel syndrome)   . Bundle branch block, right   . Coronary artery disease     prior CABG in August of 2011  . Emphysema/COPD   . Fatigue   . Joint pain   . Anxiety   . AF (atrial fibrillation)     Post op after CABG; off amiodarone  . HTN (hypertension)   . CHF (congestive heart failure)   . Dysphagia   . Colon polyps   . Hypertension   .  Hypercholesteremia   . Normal nuclear stress test October 2012  . Myocardial infarction   . Hypothyroidism   . Palpitations     Past Surgical History  Procedure Laterality Date  . Coronary artery bypass graft    . Total hip arthroplasty  11/01/2008    right  . Appendectomy  1995  . Cholecystectomy  2008    LARYNX  . Tonsillectomy and adenoidectomy    . Total abdominal hysterectomy  1986  . Cardiac surgery    . Joint replacement      Right Hip 2010    History  Smoking status  . Former Smoker -- 1.00 packs/day for 50 years  . Types: Cigarettes  . Quit date: 09/21/2009  Smokeless tobacco  . Never Used    History  Alcohol Use No    Family History  Problem Relation Age of Onset  . Heart disease Mother   . Transient ischemic attack Mother   . Hypertension Mother   . Deep vein thrombosis Mother   . Heart attack Father   . Hypertension Father   . Diabetes Father   . Heart disease Father     Heart disease before age 24  . Hyperlipidemia Father   . Breast cancer Sister   . Cancer Sister   . Other Sister     brain tumor  . Colon polyps Brother   . Hypertension Brother   . Colon cancer Neg Hx     Review of Systems: The review of systems is per the HPI.  All other systems were reviewed and are negative.  Physical Exam: BP 139/70  Pulse 55  Ht 5\' 6"  (1.676 m)  Wt 156 lb (70.761 kg)  BMI 25.19 kg/m2 Patient is alert and in no acute distress.  Skin is warm and dry. Color is normal.  HEENT is unremarkable. Normocephalic/atraumatic. PERRL. Sclera are nonicteric. Neck is supple. No masses. No JVD. Lungs are clear without wheezes or rales. Cardiac exam shows a regular rate and rhythm. Sternum is tender to palpation. There is keloid formation.  Abdomen is soft. Extremities are without edema. Gait and ROM are intact. No gross neurologic deficits noted.   LABORATORY DATA: N/A  Lab Results  Component Value Date   WBC 9.8 03/17/2011   HGB 13.4 03/17/2011   HCT 43.3  03/17/2011   PLT 217 09/25/2009   GLUCOSE 86 12/26/2010   CHOL  Value: 207        ATP III CLASSIFICATION:  <200     mg/dL   Desirable  782-956  mg/dL   Borderline High  >=213    mg/dL   High* 0/86/5784   TRIG 116 07/16/2007   HDL 45 07/16/2007   LDLCALC  Value: 139        Total Cholesterol/HDL:CHD Risk Coronary Heart Disease Risk Table                     Men   Women  1/2 Average Risk   3.4   3.3* 07/16/2007   ALT 15 09/21/2009   AST 23 09/21/2009   NA 140 12/26/2010   K 4.0 12/26/2010   CL 100 12/26/2010   CREATININE 1.1 12/26/2010   BUN 14 12/26/2010   CO2 29 12/26/2010   TSH 0.756 09/22/2009   INR 1.22 09/21/2009   HGBA1C  Value: 5.7 (NOTE)                                                                       According to the ADA Clinical Practice Recommendations for 2011, when HbA1c is used as a screening test:   >=6.5%   Diagnostic of Diabetes Mellitus           (if abnormal result  is confirmed)  5.7-6.4%   Increased risk of developing Diabetes Mellitus  References:Diagnosis and Classification of Diabetes Mellitus,Diabetes Care,2011,34(Suppl 1):S62-S69 and Standards of Medical Care in         Diabetes - 2011,Diabetes Care,2011,34  (Suppl 1):S11-S61.* 09/19/2009   Stress Myoview study is normal. No ischemia or scar. Ejection fraction 78%.  Echo:Study Conclusions  - Left ventricle: The cavity size was normal. There was mild focal basal hypertrophy of the septum. Systolic function was normal. The estimated ejection fraction was in the range of 55% to 60%. Regional wall motion abnormalities cannot be excluded. Doppler parameters are consistent with abnormal left ventricular relaxation (grade 1 diastolic dysfunction). - Aortic valve: Trivial regurgitation. - Mitral valve: Mild regurgitation. - Pulmonary arteries: PA peak pressure: 32mm Hg (S). - Pericardium, extracardiac: A trivial pericardial effusion was identified.   ECG today demonstrates normal sinus rhythm with a right bundle branch  block.  Assessment / Plan: 1. CAD - with past CABG -  no ischemia noted on Myoview study.   2. Dyspnea - echocardiogram and Myoview study were unremarkable. I suspect her shortness of breath his more related to her COPD.   3. Palpitations. These appear to be more bothersome to her. It is possible she may be having periodic atrial fibrillation. I recommended an event monitor to try and document her arrhythmia. Since her symptoms are sporadic and only occur every one to 2 months a Holter monitor would be inadequate.  4. Hypertension.  5. COPD.

## 2012-11-18 ENCOUNTER — Ambulatory Visit: Payer: Medicare Other | Admitting: Critical Care Medicine

## 2012-11-19 ENCOUNTER — Ambulatory Visit: Payer: Medicare Other | Admitting: Critical Care Medicine

## 2012-11-21 ENCOUNTER — Encounter: Payer: Self-pay | Admitting: Critical Care Medicine

## 2012-11-21 ENCOUNTER — Ambulatory Visit (INDEPENDENT_AMBULATORY_CARE_PROVIDER_SITE_OTHER): Payer: Medicare Other | Admitting: Critical Care Medicine

## 2012-11-21 VITALS — BP 124/82 | HR 72 | Temp 98.3°F | Ht 66.0 in | Wt 157.2 lb

## 2012-11-21 DIAGNOSIS — J449 Chronic obstructive pulmonary disease, unspecified: Secondary | ICD-10-CM | POA: Diagnosis not present

## 2012-11-21 MED ORDER — UMECLIDINIUM-VILANTEROL 62.5-25 MCG/INH IN AEPB: 1.0000 | INHALATION_SPRAY | Freq: Every day | RESPIRATORY_TRACT | Status: DC

## 2012-11-21 MED ORDER — ALBUTEROL SULFATE HFA 108 (90 BASE) MCG/ACT IN AERS: 2.0000 | INHALATION_SPRAY | Freq: Four times a day (QID) | RESPIRATORY_TRACT | Status: DC | PRN

## 2012-11-21 NOTE — Progress Notes (Signed)
Subjective:    Patient ID: Renee [name], female    DOB: 1935/01/31, 78 y.o.   MRN: [id number]  HPI  78 y.o.   female with known hx of COPD -former smoker -Darla Lesches   11/21/2012 Chief Complaint  Patient presents with  . Follow-up    Pt c/o SOB, chest tightness, runny nose with postnasal drip, prod cough with minimal green mucous.  Elwin Sleight is a significant issue.  Pt denies any significant sore throat, nasal congestion or excess secretions, fever, chills, sweats, unintended weight loss, pleurtic or exertional chest pain, orthopnea PND, or leg swelling Pt denies any increase in rescue therapy over baseline, denies waking up needing it or having any early am or nocturnal exacerbations of coughing/wheezing/or dyspnea. Pt also denies any obvious fluctuation in symptoms with  weather or environmental change or other alleviating or aggravating factors     Past Medical History  Diagnosis Date  . GERD (gastroesophageal reflux disease)   . IBS (irritable bowel syndrome)   . Bundle branch block, right   . Coronary artery disease     prior CABG in August of 2011  . Emphysema/COPD   . Fatigue   . Joint pain   . Anxiety   . AF (atrial fibrillation)     Post op after CABG; off amiodarone  . HTN (hypertension)   . CHF (congestive heart failure)   . Dysphagia   . Colon polyps   . Hypertension   . Hypercholesteremia   . Normal nuclear stress test October 2012  . Myocardial infarction   . Hypothyroidism   . Palpitations      Family History  Problem Relation Age of Onset  . Heart disease Mother   . Transient ischemic attack Mother   . Hypertension Mother   . Deep vein thrombosis Mother   . Heart attack Father   . Hypertension Father   . Diabetes Father   . Heart disease Father     Heart disease before age 47  . Hyperlipidemia Father   . Breast cancer Sister   . Cancer Sister   . Other Sister     brain tumor  . Colon polyps Brother   . Hypertension Brother   . Colon cancer  Neg Hx      History   Social History  . Marital Status: Married    Spouse Name: N/A    Number of Children: 1  . Years of Education: N/A   Occupational History  . nurse     retired   Social History Main Topics  . Smoking status: Former Smoker -- 1.00 packs/day for 50 years    Types: Cigarettes    Quit date: 09/21/2009  . Smokeless tobacco: Never Used  . Alcohol Use: No  . Drug Use: No  . Sexual Activity: Not on file   Other Topics Concern  . Not on file   Social History Narrative   ** Merged History Encounter **       Daily caffeine use: Dr Reino Kent and 2 cups coffee per day     Allergies  Allergen Reactions  . Spiriva [Tiotropium Bromide Monohydrate] Other (See Comments)    Throat irritation  . Statins     Legs hurt  . Levofloxacin Nausea Only    Unable to tolerate more than 7days  . Budesonide-Formoterol Fumarate     Tachycardia  . Morphine     REACTION: double vision  . Penicillins     REACTION: swelling/rash  . Phenazopyridine  Hcl     *piridium* REACTION: face red  . Pneumococcal Vaccine     REACTION: rash and cough  . Pneumococcal Vaccines Swelling    Redding of the skin  . Prednisone     REACTION: rash  . Prednisone Swelling  . Sulfonamide Derivatives     REACTION: rash  . Tylenol [Acetaminophen]     Tylenol #3 -- rash and swelling  . Penicillins Swelling and Rash  . Sulfa Antibiotics Rash     Outpatient Prescriptions Prior to Visit  Medication Sig Dispense Refill  . ALPRAZolam (XANAX) 0.25 MG tablet Take 1 tablet by mouth as needed.      [name] aspirin EC 81 MG tablet Take 81 mg by mouth daily.        . Cyanocobalamin (VITAMIN B 12) 100 MCG LOZG Inject into the skin every 30 (thirty) days.      [name] esomeprazole (NEXIUM) 40 MG capsule Take 40 mg by mouth daily before breakfast.        . furosemide (LASIX) 40 MG tablet TAKE 1 TABLET BY MOUTH TWICE DAILY  60 tablet  5  . levothyroxine (SYNTHROID, LEVOTHROID) 25 MCG tablet Take 1 tablet by mouth daily.       . metoprolol tartrate (LOPRESSOR) 25 MG tablet Take 1 tablet (25 mg total) by mouth 2 (two) times daily.  60 tablet  4  . nitroGLYCERIN (NITROSTAT) 0.4 MG SL tablet PLACE 1 TABLET UNDER THE TONGUE EVERY 5 MINUTES AS NEEDED FOR CHEST PAIN  25 tablet  3  . NON FORMULARY as needed. Oxygen - 3L pulse with extertion, 2L continuious QHS.      Bertram Gala Glycol-Propyl Glycol (SYSTANE OP) Apply 1 drop to eye daily.        . potassium chloride SA (K-DUR,KLOR-CON) 20 MEQ tablet Take 1 tablet (20 mEq total) by mouth 2 (two) times daily.  60 tablet  6  . traMADol (ULTRAM) 50 MG tablet Take 1 tablet (50 mg total) by mouth every 6 (six) hours as needed. Pain  30 tablet  0  . VITAMIN D, CHOLECALCIFEROL, PO Take 1 tablet by mouth daily.        [name] PROAIR HFA 108 (90 BASE) MCG/ACT inhaler Inhale 2 puffs into the lungs Every 6 hours as needed.       No facility-administered medications prior to visit.     Review of Systems  Constitutional:   No  weight loss, night sweats,  Fevers, chills, + fatigue, or  lassitude.  HEENT:   No headaches,  Difficulty swallowing,  Tooth/dental problems, or  Sore throat,                No sneezing, itching, ear ache,no nasal congestion,no post nasal drip,   CV:   Orthopnea, PND, swelling in lower extremities, anasarca, dizziness, palpitations, syncope.   GI  No heartburn, indigestion, abdominal pain, nausea, vomiting, diarrhea, change in bowel habits, loss of appetite, bloody stools.   Resp:    No coughing up of blood.    No chest wall deformity  Skin: no rash or lesions.  GU: no dysuria, change in color of urine, no urgency or frequency.  No flank pain, no hematuria   MS:  No joint pain or swelling.  No decreased range of motion.   Psych:  No change in mood or affect. No depression or anxiety.            Objective:   Physical Exam BP 124/82  Pulse 72  Temp(Src) 98.3 F (36.8 C) (Oral)  Ht 5\' 6"  (1.676 m)  Wt 157 lb 3.2 oz (71.305 kg)  BMI 25.38 kg/m2   SpO2 98%  GEN: A/Ox3; pleasant , NAD, elderly  HEENT:  [place]/AT,  EACs-clear, TMs-wnl, NOSE no evidence of inflammaiton    NECK:  Supple w/ fair ROM; no JVD; normal carotid impulses w/o bruits; no thyromegaly or nodules palpated; no lymphadenopathy.  RESP  Distant BS no accessory muscle use, no dullness to percussion  CARD:  RRR, no m/r/g  , no peripheral edema, pulses intact, no cyanosis or clubbing.  GI:   Soft & nt; nml bowel sounds; no organomegaly or masses detected.  Musco: Warm bil, no deformities or joint swelling noted.   Neuro: alert, no focal deficits noted.    Skin: Warm, no lesions or rashes  No results found.        Assessment & Plan:  COPD Gold C Gold C copd stable  Difficult time tolerating inhaled medications with steroids Plan Trial Anoro one puff bid Note pt prefers to use valium over xanax>> i am ok with this change       Updated Medication List Outpatient Encounter Prescriptions as of 11/21/2012  Medication Sig Dispense Refill  . albuterol (PROAIR HFA) 108 (90 BASE) MCG/ACT inhaler Inhale 2 puffs into the lungs every 6 (six) hours as needed for wheezing.  18 g  4  . ALPRAZolam (XANAX) 0.25 MG tablet Take 1 tablet by mouth as needed.      [name] aspirin EC 81 MG tablet Take 81 mg by mouth daily.        . Cyanocobalamin (VITAMIN B 12) 100 MCG LOZG Inject into the skin every 30 (thirty) days.      [name] esomeprazole (NEXIUM) 40 MG capsule Take 40 mg by mouth daily before breakfast.        . furosemide (LASIX) 40 MG tablet TAKE 1 TABLET BY MOUTH TWICE DAILY  60 tablet  5  . levothyroxine (SYNTHROID, LEVOTHROID) 25 MCG tablet Take 1 tablet by mouth daily.      . metoprolol tartrate (LOPRESSOR) 25 MG tablet Take 1 tablet (25 mg total) by mouth 2 (two) times daily.  60 tablet  4  . nitroGLYCERIN (NITROSTAT) 0.4 MG SL tablet PLACE 1 TABLET UNDER THE TONGUE EVERY 5 MINUTES AS NEEDED FOR CHEST PAIN  25 tablet  3  . NON FORMULARY as needed. Oxygen - 3L pulse with  extertion, 2L continuious QHS.      Bertram Gala Glycol-Propyl Glycol (SYSTANE OP) Apply 1 drop to eye daily.        . potassium chloride SA (K-DUR,KLOR-CON) 20 MEQ tablet Take 1 tablet (20 mEq total) by mouth 2 (two) times daily.  60 tablet  6  . traMADol (ULTRAM) 50 MG tablet Take 1 tablet (50 mg total) by mouth every 6 (six) hours as needed. Pain  30 tablet  0  . VITAMIN D, CHOLECALCIFEROL, PO Take 1 tablet by mouth daily.        . [DISCONTINUED] PROAIR HFA 108 (90 BASE) MCG/ACT inhaler Inhale 2 puffs into the lungs Every 6 hours as needed.      [name] Umeclidinium-Vilanterol (ANORO ELLIPTA) 62.5-25 MCG/INH AEPB Inhale 1 puff into the lungs daily.  14 each  1   No facility-administered encounter medications on file as of 11/21/2012.

## 2012-11-21 NOTE — Patient Instructions (Signed)
I will let Dr Clelia Croft know you can switch to as needed Valium and stop xanax Trial Anoro one puff daily, call us results of the trial Proair as needed Return 6 months

## 2012-11-22 NOTE — Assessment & Plan Note (Signed)
Gold C copd stable  Difficult time tolerating inhaled medications with steroids Plan Trial Anoro one puff bid Note pt prefers to use valium over xanax>> i am ok with this change

## 2012-11-28 ENCOUNTER — Other Ambulatory Visit: Payer: Self-pay

## 2012-11-28 ENCOUNTER — Encounter: Payer: Self-pay | Admitting: Radiology

## 2012-11-28 ENCOUNTER — Encounter (INDEPENDENT_AMBULATORY_CARE_PROVIDER_SITE_OTHER): Payer: Medicare Other

## 2012-11-28 DIAGNOSIS — R002 Palpitations: Secondary | ICD-10-CM

## 2012-11-28 NOTE — Progress Notes (Signed)
Patient ID: Renee [name], female   DOB: January 03, 1935, 78 y.o.   MRN: [id number] E cardio 30 day Verite monitor applied

## 2012-12-01 ENCOUNTER — Telehealth: Payer: Self-pay | Admitting: Critical Care Medicine

## 2012-12-01 ENCOUNTER — Telehealth: Payer: Self-pay | Admitting: Cardiology

## 2012-12-01 NOTE — Telephone Encounter (Signed)
New Problem:  Pt states she got a 30 day heart monitor on Friday. And had to take it off on Saturday morning around 6:52 am. Pt states it was very bothersome to her surgical incision. Please advise.Marland KitchenMarland Kitchen

## 2012-12-01 NOTE — Telephone Encounter (Signed)
Would just use albuterol as needed Very limited now with any other options

## 2012-12-01 NOTE — Telephone Encounter (Signed)
Returned call to patient she stated she had monitor put on Friday 11/28/12 and Saturday night she had to take monitor off.Stated monitor wires irritating chest incision.Stated chest incision very sensitive.Advised to use paper tape and tape wires so wires don't touch incision.Stated she might try and if don't work she will mail back monitor.Advised to try to wear monitor so Dr.Jordan can see if you are having atrial fib.

## 2012-12-01 NOTE — Telephone Encounter (Signed)
Suspect this may have aggravated the pressure in her eye  Stay off anoro

## 2012-12-01 NOTE — Telephone Encounter (Signed)
I called and made pt aware. Nothing further needed 

## 2012-12-01 NOTE — Telephone Encounter (Signed)
Last visit 11-21-12. Pt states she tried the anoro from lst visit and after 2 days her vision became blurry so she stopped. She states her vision has been fine since stopping. Pt states she does not think PW is aware that she has an eye condition where she has a "wrinkle" behind her eye and she wonders if this is why her vision was effected. Her eye doctor is Dr. Luciana Axe. Please advise. [name], CMA Allergies  Allergen Reactions  . Spiriva [Tiotropium Bromide Monohydrate] Other (See Comments)    Throat irritation  . Statins     Legs hurt  . Levofloxacin Nausea Only    Unable to tolerate more than 7days  . Budesonide-Formoterol Fumarate     Tachycardia  . Morphine     REACTION: double vision  . Penicillins     REACTION: swelling/rash  . Phenazopyridine Hcl     *piridium* REACTION: face red  . Pneumococcal Vaccine     REACTION: rash and cough  . Pneumococcal Vaccines Swelling    Redding of the skin  . Prednisone     REACTION: rash  . Prednisone Swelling  . Sulfonamide Derivatives     REACTION: rash  . Tylenol [Acetaminophen]     Tylenol #3 -- rash and swelling  . Penicillins Swelling and Rash  . Sulfa Antibiotics Rash

## 2012-12-01 NOTE — Telephone Encounter (Signed)
Follow Up  Pt request a call back about the 30 day Monitor and how it irritates her surgical incision.. Please assist

## 2012-12-02 ENCOUNTER — Telehealth: Payer: Self-pay | Admitting: Cardiology

## 2012-12-02 DIAGNOSIS — Z951 Presence of aortocoronary bypass graft: Secondary | ICD-10-CM | POA: Diagnosis not present

## 2012-12-02 DIAGNOSIS — I2510 Atherosclerotic heart disease of native coronary artery without angina pectoris: Secondary | ICD-10-CM | POA: Diagnosis not present

## 2012-12-02 DIAGNOSIS — M899 Disorder of bone, unspecified: Secondary | ICD-10-CM | POA: Diagnosis not present

## 2012-12-02 DIAGNOSIS — I10 Essential (primary) hypertension: Secondary | ICD-10-CM | POA: Diagnosis not present

## 2012-12-02 DIAGNOSIS — B373 Candidiasis of vulva and vagina: Secondary | ICD-10-CM | POA: Diagnosis not present

## 2012-12-02 DIAGNOSIS — J449 Chronic obstructive pulmonary disease, unspecified: Secondary | ICD-10-CM | POA: Diagnosis not present

## 2012-12-02 DIAGNOSIS — E039 Hypothyroidism, unspecified: Secondary | ICD-10-CM | POA: Diagnosis not present

## 2012-12-02 DIAGNOSIS — D510 Vitamin B12 deficiency anemia due to intrinsic factor deficiency: Secondary | ICD-10-CM | POA: Diagnosis not present

## 2012-12-02 NOTE — Telephone Encounter (Signed)
Called by e-cardio due to pt with afib with RVR on the monitor. Ongoing for approx 40 mins. Called pt who reports noting the palpitations and decrease exercise tolerance. Similar to what she has described in the note to Dr. Swaziland who suspected afib during the clinic visit. Took her evening metoprolol at around 730 pm. No red flag symptoms of syncope, pre-syncope or LOC, chest pain. She reports tolerating it well "just like my other episodes" (seemingly not too concerned) which she states last approx 1-2 hours. Recommended that she rest and if arrhythmia does not spontaneously convert in the next hour "like usual" then she should seek emergency care. She verbalized understanding and agreement with the plan. FYI to Dr. Swaziland.

## 2012-12-03 ENCOUNTER — Telehealth: Payer: Self-pay | Admitting: Cardiology

## 2012-12-03 NOTE — Telephone Encounter (Signed)
Returned call to patient she stated she had fast heart beat last night.Stated no fast heart beat this morning.Advised will be showing ECardio monitor strips from last night to Dr.Jordan this afternoon and will call her back.

## 2012-12-03 NOTE — Telephone Encounter (Signed)
New message    Wearing monitor ----someone called her and told her to call Dr Swaziland.  She is having tachycardia.

## 2012-12-03 NOTE — Telephone Encounter (Signed)
Follow Up//   Pt states that she forgot to mention that her legs were without circulation last night as well, However this morniong they are fine.Renee [name]

## 2012-12-04 ENCOUNTER — Ambulatory Visit: Payer: Medicare Other | Admitting: Cardiology

## 2012-12-04 ENCOUNTER — Encounter: Payer: Self-pay | Admitting: Cardiology

## 2012-12-04 ENCOUNTER — Ambulatory Visit (INDEPENDENT_AMBULATORY_CARE_PROVIDER_SITE_OTHER): Payer: Medicare Other | Admitting: Cardiology

## 2012-12-04 VITALS — BP 132/78 | HR 95 | Ht 66.0 in | Wt 156.0 lb

## 2012-12-04 DIAGNOSIS — I4891 Unspecified atrial fibrillation: Secondary | ICD-10-CM | POA: Diagnosis not present

## 2012-12-04 DIAGNOSIS — I480 Paroxysmal atrial fibrillation: Secondary | ICD-10-CM

## 2012-12-04 DIAGNOSIS — I2510 Atherosclerotic heart disease of native coronary artery without angina pectoris: Secondary | ICD-10-CM

## 2012-12-04 DIAGNOSIS — I509 Heart failure, unspecified: Secondary | ICD-10-CM | POA: Diagnosis not present

## 2012-12-04 MED ORDER — DILTIAZEM HCL ER COATED BEADS 240 MG PO CP24: 240.0000 mg | ORAL_CAPSULE | Freq: Every day | ORAL | Status: DC

## 2012-12-04 MED ORDER — APIXABAN 5 MG PO TABS: 5.0000 mg | ORAL_TABLET | Freq: Two times a day (BID) | ORAL | Status: DC

## 2012-12-04 NOTE — Progress Notes (Signed)
Renee [name] Date of Birth: 11/27/1934 Medical Record #[id number]  History of Present Illness: Renee [name] is seen back today for assessment of paroxysmal atrial fibrillation.. She has known CAD with past CABG in August of 2011. Other issues include chronic diastolic heart failure, COPD, sinusitis, GERD, RBBB, anxiety, HTN, HLD and hypothyroidism.  She had a Myoview study in April 2014 which was normal. An echocardiogram at that time demonstrated normal LV function. She had mild mitral insufficiency without evidence of prolapse. On her last visit she complained of episodes of rapid heartbeat. We had her underwear an event monitor. With her episode of tachycardia she was noted to be in atrial fibrillation with a rate up to 190 beats per minute. She states she has been having these episodes once every one to 2 months. It may last several hours at a time. She has no dizziness or syncope. After her previous bypass she had atrial fibrillation. She was placed on amiodarone and was intolerant to this.  Current Outpatient Prescriptions on File Prior to Visit  Medication Sig Dispense Refill  . albuterol (PROAIR HFA) 108 (90 BASE) MCG/ACT inhaler Inhale 2 puffs into the lungs every 6 (six) hours as needed for wheezing.  18 g  4  . ALPRAZolam (XANAX) 0.25 MG tablet Take 1 tablet by mouth as needed.      . Cyanocobalamin (VITAMIN B 12) 100 MCG LOZG Inject into the skin every 30 (thirty) days.      [name] esomeprazole (NEXIUM) 40 MG capsule Take 40 mg by mouth daily before breakfast.        . furosemide (LASIX) 40 MG tablet TAKE 1 TABLET BY MOUTH TWICE DAILY  60 tablet  5  . levothyroxine (SYNTHROID, LEVOTHROID) 25 MCG tablet Take 1 tablet by mouth daily.      . metoprolol tartrate (LOPRESSOR) 25 MG tablet Take 1 tablet (25 mg total) by mouth 2 (two) times daily.  60 tablet  4  . nitroGLYCERIN (NITROSTAT) 0.4 MG SL tablet PLACE 1 TABLET UNDER THE TONGUE EVERY 5 MINUTES AS NEEDED FOR CHEST PAIN  25 tablet  3  . NON  FORMULARY as needed. Oxygen - 3L pulse with extertion, 2L continuious QHS.      Bertram Gala Glycol-Propyl Glycol (SYSTANE OP) Apply 1 drop to eye daily.        . potassium chloride SA (K-DUR,KLOR-CON) 20 MEQ tablet Take 1 tablet (20 mEq total) by mouth 2 (two) times daily.  60 tablet  6  . traMADol (ULTRAM) 50 MG tablet Take 1 tablet (50 mg total) by mouth every 6 (six) hours as needed. Pain  30 tablet  0  . Umeclidinium-Vilanterol (ANORO ELLIPTA) 62.5-25 MCG/INH AEPB Inhale 1 puff into the lungs daily.  14 each  1  . VITAMIN D, CHOLECALCIFEROL, PO Take 1 tablet by mouth daily.        . [DISCONTINUED] budesonide-formoterol (SYMBICORT) 160-4.5 MCG/ACT inhaler Inhale 2 puffs into the lungs 2 (two) times daily.  1 Inhaler  12  . [DISCONTINUED] calcium citrate-vitamin D (CITRACAL+D) 315-200 MG-UNIT per tablet Take 1 tablet by mouth 2 (two) times daily.         No current facility-administered medications on file prior to visit.    Allergies  Allergen Reactions  . Spiriva [Tiotropium Bromide Monohydrate] Other (See Comments)    Throat irritation  . Statins     Legs hurt  . Levofloxacin Nausea Only    Unable to tolerate more than 7days  . Budesonide-Formoterol Fumarate  Tachycardia  . Morphine     REACTION: double vision  . Penicillins     REACTION: swelling/rash  . Phenazopyridine Hcl     *piridium* REACTION: face red  . Pneumococcal Vaccine     REACTION: rash and cough  . Pneumococcal Vaccines Swelling    Redding of the skin  . Prednisone     REACTION: rash  . Prednisone Swelling  . Sulfonamide Derivatives     REACTION: rash  . Tylenol [Acetaminophen]     Tylenol #3 -- rash and swelling  . Penicillins Swelling and Rash  . Sulfa Antibiotics Rash    Past Medical History  Diagnosis Date  . GERD (gastroesophageal reflux disease)   . IBS (irritable bowel syndrome)   . Bundle branch block, right   . Coronary artery disease     prior CABG in August of 2011  . Emphysema/COPD    . Fatigue   . Joint pain   . Anxiety   . AF (atrial fibrillation)     Post op after CABG; off amiodarone  . HTN (hypertension)   . CHF (congestive heart failure)   . Dysphagia   . Colon polyps   . Hypertension   . Hypercholesteremia   . Normal nuclear stress test October 2012  . Myocardial infarction   . Hypothyroidism   . Palpitations     Past Surgical History  Procedure Laterality Date  . Coronary artery bypass graft    . Total hip arthroplasty  11/01/2008    right  . Appendectomy  1995  . Cholecystectomy  2008    LARYNX  . Tonsillectomy and adenoidectomy    . Total abdominal hysterectomy  1986  . Cardiac surgery    . Joint replacement      Right Hip 2010    History  Smoking status  . Former Smoker -- 1.00 packs/day for 50 years  . Types: Cigarettes  . Quit date: 09/21/2009  Smokeless tobacco  . Never Used    History  Alcohol Use No    Family History  Problem Relation Age of Onset  . Heart disease Mother   . Transient ischemic attack Mother   . Hypertension Mother   . Deep vein thrombosis Mother   . Heart attack Father   . Hypertension Father   . Diabetes Father   . Heart disease Father     Heart disease before age 39  . Hyperlipidemia Father   . Breast cancer Sister   . Cancer Sister   . Other Sister     brain tumor  . Colon polyps Brother   . Hypertension Brother   . Colon cancer Neg Hx     Review of Systems: The review of systems is per the HPI.  All other systems were reviewed and are negative.  Physical Exam: BP 132/78  Pulse 95  Ht 5\' 6"  (1.676 m)  Wt 156 lb (70.761 kg)  BMI 25.19 kg/m2  SpO2 99% Patient is alert and in no acute distress.  Skin is warm and dry. Color is normal.  HEENT is unremarkable. Normocephalic/atraumatic. PERRL. Sclera are nonicteric. Neck is supple. No masses. No JVD. Lungs are clear without wheezes or rales. Cardiac exam shows a regular rate and rhythm. Sternum is tender to palpation. There is keloid  formation.  Abdomen is soft. Extremities are without edema. Gait and ROM are intact. No gross neurologic deficits noted.   LABORATORY DATA: N/A  Lab Results  Component Value Date   WBC 9.8  03/17/2011   HGB 13.4 03/17/2011   HCT 43.3 03/17/2011   PLT 217 09/25/2009   GLUCOSE 86 12/26/2010   CHOL  Value: 207        ATP III CLASSIFICATION:  <200     mg/dL   Desirable  161-096  mg/dL   Borderline High  >=045    mg/dL   High* 05/14/8117   TRIG 116 07/16/2007   HDL 45 07/16/2007   LDLCALC  Value: 139        Total Cholesterol/HDL:CHD Risk Coronary Heart Disease Risk Table                     Men   Women  1/2 Average Risk   3.4   3.3* 07/16/2007   ALT 15 09/21/2009   AST 23 09/21/2009   NA 140 12/26/2010   K 4.0 12/26/2010   CL 100 12/26/2010   CREATININE 1.1 12/26/2010   BUN 14 12/26/2010   CO2 29 12/26/2010   TSH 0.756 09/22/2009   INR 1.22 09/21/2009   HGBA1C  Value: 5.7 (NOTE)                                                                       According to the ADA Clinical Practice Recommendations for 2011, when HbA1c is used as a screening test:   >=6.5%   Diagnostic of Diabetes Mellitus           (if abnormal result  is confirmed)  5.7-6.4%   Increased risk of developing Diabetes Mellitus  References:Diagnosis and Classification of Diabetes Mellitus,Diabetes Care,2011,34(Suppl 1):S62-S69 and Standards of Medical Care in         Diabetes - 2011,Diabetes Care,2011,34  (Suppl 1):S11-S61.* 09/19/2009   Stress Myoview study is normal. No ischemia or scar. Ejection fraction 78%.  Echo:Study Conclusions  - Left ventricle: The cavity size was normal. There was mild focal basal hypertrophy of the septum. Systolic function was normal. The estimated ejection fraction was in the range of 55% to 60%. Regional wall motion abnormalities cannot be excluded. Doppler parameters are consistent with abnormal left ventricular relaxation (grade 1 diastolic dysfunction). - Aortic valve: Trivial regurgitation. -  Mitral valve: Mild regurgitation. - Pulmonary arteries: PA peak pressure: 32mm Hg (S). - Pericardium, extracardiac: A trivial pericardial effusion was identified.   event monitor demonstrates atrial fibrillation with rapid ventricular response with rates up to 190 beats per minute.  Assessment / Plan: 1. CAD - with past CABG -  no ischemia noted on Myoview study.   2. Dyspnea - echocardiogram and Myoview study were unremarkable. I suspect her shortness of breath his more related to her COPD.   3. Paroxysmal atrial fibrillation. Newly diagnosed. CHAD-VASC score of 4. I have recommended anticoagulation. After discussion of her options we have recommended Eliquis 5 mg twice a day. She will stop aspirin. She understands to avoid nonsteroidal anti-inflammatory drugs. We discussed rate control strategies. She has been on metoprolol 25 mg twice a day. I am concerned about increasing the dose with her COPD. I recommended the addition of diltiazem 240 mg daily for rate control. We will need to watch closely for worsening of her irritable bowel. From the standpoint of antiarrhythmic drug therapy her options  are very limited. She has previously been intolerant of amiodarone. She is not a candidate for flecainide with her history of coronary disease. Her QTC was 479 ms with only an incomplete right bundle branch block so she would not be a candidate for Tikosyn. I don't think she would tolerate sotalol well at all. At this point we will pursue a rate control strategy. If she has frequent episodes of atrial fibrillation I will refer her to Dr. Johney Frame.   4. Hypertension.  5. COPD.

## 2012-12-04 NOTE — Telephone Encounter (Signed)
Returned call to patient 12/03/12 Dr.Jordan reviewed ECardio monitor strips from 11/28/12 which revealed atrial fib rate 190 beats/min.Advised will need appointment to discuss.Appointment scheduled with Dr.Jordan 12/04/12 at 12:00 noon.

## 2012-12-04 NOTE — Telephone Encounter (Signed)
Appointment scheduled with Dr.Jordan 12/04/12.

## 2012-12-04 NOTE — Patient Instructions (Signed)
Stop ASA  Start Eliquis 5 mg twice a day.  Add Cardizem CD 240 mg daily to help with rate control.  I will see you in 6 weeks.

## 2012-12-10 ENCOUNTER — Telehealth: Payer: Self-pay | Admitting: Cardiology

## 2012-12-10 NOTE — Telephone Encounter (Signed)
New Problem  Pt requests a call back about blood thinner (no detail)

## 2012-12-10 NOTE — Telephone Encounter (Signed)
Spoke with patient who states she recently started Eliquis and wanted to know if it is okay to have dental cleaning.  I advised patient that there is no problem with taking Eliquis prior to a dental cleaning but that if dentist needs to do anything invasive that they will have to call the office to discuss treatment plan.  Patient verbalized understanding.   Patient also states that per Dr. Elvis Coil advice she discontinued her 30 day monitor (applied 10/24) on 10/29.  Patient mailed monitor back and states that the monitor company called her wanting to know why she had returned the monitor.  Patient asked [place] to notify the monitor company to let them know Dr. Sw[place] saw what he needed in those 5 days and patient and monitor was discontinued.  I am sending message to Andee Lineman for notification of the monitor company.

## 2012-12-16 ENCOUNTER — Other Ambulatory Visit: Payer: Self-pay | Admitting: Cardiology

## 2013-01-05 DIAGNOSIS — R05 Cough: Secondary | ICD-10-CM | POA: Diagnosis not present

## 2013-01-05 DIAGNOSIS — Z6825 Body mass index (BMI) 25.0-25.9, adult: Secondary | ICD-10-CM | POA: Diagnosis not present

## 2013-01-05 DIAGNOSIS — J449 Chronic obstructive pulmonary disease, unspecified: Secondary | ICD-10-CM | POA: Diagnosis not present

## 2013-01-05 DIAGNOSIS — J209 Acute bronchitis, unspecified: Secondary | ICD-10-CM | POA: Diagnosis not present

## 2013-01-05 DIAGNOSIS — E538 Deficiency of other specified B group vitamins: Secondary | ICD-10-CM | POA: Diagnosis not present

## 2013-01-15 ENCOUNTER — Ambulatory Visit (INDEPENDENT_AMBULATORY_CARE_PROVIDER_SITE_OTHER): Payer: Medicare Other | Admitting: Cardiology

## 2013-01-15 ENCOUNTER — Encounter (INDEPENDENT_AMBULATORY_CARE_PROVIDER_SITE_OTHER): Payer: Self-pay

## 2013-01-15 ENCOUNTER — Encounter: Payer: Self-pay | Admitting: Cardiology

## 2013-01-15 VITALS — BP 128/70 | HR 92 | Ht 66.0 in | Wt 154.0 lb

## 2013-01-15 DIAGNOSIS — I480 Paroxysmal atrial fibrillation: Secondary | ICD-10-CM

## 2013-01-15 DIAGNOSIS — I2510 Atherosclerotic heart disease of native coronary artery without angina pectoris: Secondary | ICD-10-CM | POA: Diagnosis not present

## 2013-01-15 DIAGNOSIS — I509 Heart failure, unspecified: Secondary | ICD-10-CM | POA: Diagnosis not present

## 2013-01-15 DIAGNOSIS — I83893 Varicose veins of bilateral lower extremities with other complications: Secondary | ICD-10-CM

## 2013-01-15 DIAGNOSIS — I4891 Unspecified atrial fibrillation: Secondary | ICD-10-CM

## 2013-01-15 NOTE — Patient Instructions (Signed)
Continue your current therapy   I will see you in 3 months. 

## 2013-01-15 NOTE — Progress Notes (Signed)
Renee [name] Date of Birth: [birth year] Medical Record #[id number]  History of Present Illness: Renee [name] is seen back today for follow up of paroxysmal atrial fibrillation.. She has known CAD with past CABG in August of 2011. Other issues include chronic diastolic heart failure, COPD, sinusitis, GERD, RBBB, anxiety, HTN, HLD and hypothyroidism.  She had a Myoview study in April 2014 which was normal. An echocardiogram at that time demonstrated normal LV function. She had mild mitral insufficiency without evidence of prolapse. In October she wore an event monitor. This demonstrated episodes of rapid Afib with rates up to 190 bpm. We added diltiazem 240 mg daily to metoprolol. The first 3 weeks she felt much better with resting HR around 60. Since then HR has been higher- in the 90s but she is no longer having the very rapid episodes. She still notes symptoms of SOB and chest pain when HR is elevated.   Current Outpatient Prescriptions on File Prior to Visit  Medication Sig Dispense Refill  . albuterol (PROAIR HFA) 108 (90 BASE) MCG/ACT inhaler Inhale 2 puffs into the lungs every 6 (six) hours as needed for wheezing.  18 g  4  . ALPRAZolam (XANAX) 0.25 MG tablet Take 1 tablet by mouth as needed.      [name] apixaban (ELIQUIS) 5 MG TABS tablet Take 1 tablet (5 mg total) by mouth 2 (two) times daily.  60 tablet  11  . Cyanocobalamin (VITAMIN B 12) 100 MCG LOZG Inject into the skin every 30 (thirty) days.      [name] diltiazem (CARDIZEM CD) 240 MG 24 hr capsule Take 1 capsule (240 mg total) by mouth daily.  90 capsule  3  . esomeprazole (NEXIUM) 40 MG capsule Take 40 mg by mouth daily before breakfast.        . furosemide (LASIX) 40 MG tablet TAKE 1 TABLET BY MOUTH TWICE DAILY  60 tablet  5  . levothyroxine (SYNTHROID, LEVOTHROID) 25 MCG tablet Take 1 tablet by mouth daily.      . metoprolol tartrate (LOPRESSOR) 25 MG tablet TAKE 1 TABLET BY MOUTH TWICE DAILY  60 tablet  1  . nitroGLYCERIN (NITROSTAT) 0.4 MG  SL tablet PLACE 1 TABLET UNDER THE TONGUE EVERY 5 MINUTES AS NEEDED FOR CHEST PAIN  25 tablet  3  . NON FORMULARY as needed. Oxygen - 3L pulse with extertion, 2L continuious QHS.      Bertram Gala Glycol-Propyl Glycol (SYSTANE OP) Apply 1 drop to eye daily.        . potassium chloride SA (K-DUR,KLOR-CON) 20 MEQ tablet Take 1 tablet (20 mEq total) by mouth 2 (two) times daily.  60 tablet  6  . traMADol (ULTRAM) 50 MG tablet Take 1 tablet (50 mg total) by mouth every 6 (six) hours as needed. Pain  30 tablet  0  . Umeclidinium-Vilanterol (ANORO ELLIPTA) 62.5-25 MCG/INH AEPB Inhale 1 puff into the lungs daily.  14 each  1  . VITAMIN D, CHOLECALCIFEROL, PO Take 1 tablet by mouth daily.        . [DISCONTINUED] budesonide-formoterol (SYMBICORT) 160-4.5 MCG/ACT inhaler Inhale 2 puffs into the lungs 2 (two) times daily.  1 Inhaler  12  . [DISCONTINUED] calcium citrate-vitamin D (CITRACAL+D) 315-200 MG-UNIT per tablet Take 1 tablet by mouth 2 (two) times daily.         No current facility-administered medications on file prior to visit.    Allergies  Allergen Reactions  . Spiriva [Tiotropium Bromide Monohydrate] Other (See Comments)  Throat irritation  . Statins     Legs hurt  . Levofloxacin Nausea Only    Unable to tolerate more than 7days  . Budesonide-Formoterol Fumarate     Tachycardia  . Morphine     REACTION: double vision  . Penicillins     REACTION: swelling/rash  . Phenazopyridine Hcl     *piridium* REACTION: face red  . Pneumococcal Vaccine     REACTION: rash and cough  . Pneumococcal Vaccines Swelling    Redding of the skin  . Prednisone     REACTION: rash  . Prednisone Swelling  . Sulfonamide Derivatives     REACTION: rash  . Tylenol [Acetaminophen]     Tylenol #3 -- rash and swelling  . Penicillins Swelling and Rash  . Sulfa Antibiotics Rash    Past Medical History  Diagnosis Date  . GERD (gastroesophageal reflux disease)   . IBS (irritable bowel syndrome)   .  Bundle branch block, right   . Coronary artery disease     prior CABG in August of 2011  . Emphysema/COPD   . Fatigue   . Joint pain   . Anxiety   . AF (atrial fibrillation)     Post op after CABG; off amiodarone  . HTN (hypertension)   . CHF (congestive heart failure)   . Dysphagia   . Colon polyps   . Hypertension   . Hypercholesteremia   . Normal nuclear stress test October 2012  . Myocardial infarction   . Hypothyroidism   . Palpitations     Past Surgical History  Procedure Laterality Date  . Coronary artery bypass graft    . Total hip arthroplasty  11/01/2008    right  . Appendectomy  1995  . Cholecystectomy  2008    LARYNX  . Tonsillectomy and adenoidectomy    . Total abdominal hysterectomy  1986  . Cardiac surgery    . Joint replacement      Right Hip 2010    History  Smoking status  . Former Smoker -- 1.00 packs/day for 50 years  . Types: Cigarettes  . Quit date: 09/21/2009  Smokeless tobacco  . Never Used    History  Alcohol Use No    Family History  Problem Relation Age of Onset  . Heart disease Mother   . Transient ischemic attack Mother   . Hypertension Mother   . Deep vein thrombosis Mother   . Heart attack Father   . Hypertension Father   . Diabetes Father   . Heart disease Father     Heart disease before age 14  . Hyperlipidemia Father   . Breast cancer Sister   . Cancer Sister   . Other Sister     brain tumor  . Colon polyps Brother   . Hypertension Brother   . Colon cancer Neg Hx     Review of Systems: The review of systems is per the HPI.  All other systems were reviewed and are negative.  Physical Exam: BP 128/70  Pulse 92  Ht 5\' 6"  (1.676 m)  Wt 154 lb (69.854 kg)  BMI 24.87 kg/m2 Patient is alert and in no acute distress.  Skin is warm and dry. Color is normal.  HEENT is unremarkable. Normocephalic/atraumatic. PERRL. Sclera are nonicteric. Neck is supple. No masses. No JVD. Lungs are clear without wheezes or rales.  Cardiac exam shows a regular rate and rhythm. Sternum is tender to palpation. There is keloid formation.  Abdomen is soft.  Extremities are without edema. Gait and ROM are intact. No gross neurologic deficits noted.   LABORATORY DATA: N/A  Lab Results  Component Value Date   WBC 9.8 03/17/2011   HGB 13.4 03/17/2011   HCT 43.3 03/17/2011   PLT 217 09/25/2009   GLUCOSE 86 12/26/2010   CHOL  Value: 207        ATP III CLASSIFICATION:  <200     mg/dL   Desirable  161-096  mg/dL   Borderline High  >=045    mg/dL   High* 05/14/8117   TRIG 116 07/16/2007   HDL 45 07/16/2007   LDLCALC  Value: 139        Total Cholesterol/HDL:CHD Risk Coronary Heart Disease Risk Table                     Men   Women  1/2 Average Risk   3.4   3.3* 07/16/2007   ALT 15 09/21/2009   AST 23 09/21/2009   NA 140 12/26/2010   K 4.0 12/26/2010   CL 100 12/26/2010   CREATININE 1.1 12/26/2010   BUN 14 12/26/2010   CO2 29 12/26/2010   TSH 0.756 09/22/2009   INR 1.22 09/21/2009   HGBA1C  Value: 5.7 (NOTE)                                                                       According to the ADA Clinical Practice Recommendations for 2011, when HbA1c is used as a screening test:   >=6.5%   Diagnostic of Diabetes Mellitus           (if abnormal result  is confirmed)  5.7-6.4%   Increased risk of developing Diabetes Mellitus  References:Diagnosis and Classification of Diabetes Mellitus,Diabetes Care,2011,34(Suppl 1):S62-S69 and Standards of Medical Care in         Diabetes - 2011,Diabetes Care,2011,34  (Suppl 1):S11-S61.* 09/19/2009   Stress Myoview study is normal. No ischemia or scar. Ejection fraction 78%.  Echo:Study Conclusions  - Left ventricle: The cavity size was normal. There was mild focal basal hypertrophy of the septum. Systolic function was normal. The estimated ejection fraction was in the range of 55% to 60%. Regional wall motion abnormalities cannot be excluded. Doppler parameters are consistent with abnormal left  ventricular relaxation (grade 1 diastolic dysfunction). - Aortic valve: Trivial regurgitation. - Mitral valve: Mild regurgitation. - Pulmonary arteries: PA peak pressure: 32mm Hg (S). - Pericardium, extracardiac: A trivial pericardial effusion was identified.  Event monitor demonstrated atrial fibrillation with rapid ventricular response with rates up to 190 beats per minute.  Assessment / Plan: 1. CAD - with past CABG -  no ischemia noted on Myoview study.   2. Dyspnea - echocardiogram and Myoview study were unremarkable. I suspect her shortness of breath his more related to her COPD. She states she has required more oxygen recently.  3. Paroxysmal atrial fibrillation. Newly diagnosed. CHAD-VASC score of 4.On  Eliquis 5 mg twice a day. She understands to avoid nonsteroidal anti-inflammatory drugs. We will continue rate control with diltiazem and metoprolol. She is going to track her high HR episodes and keep a diary. I will follow up in 3 months. If needed we can increase her diltiazem dose  up since we have room with HR and BP parameters. From the standpoint of antiarrhythmic drug therapy her options are very limited. She has previously been intolerant of amiodarone. She is not a candidate for flecainide with her history of coronary disease. Her QTC was 479 ms with only an incomplete right bundle branch block so she would not be a candidate for Tikosyn. I don't think she would tolerate sotalol well at all.  If she has frequent episodes of atrial fibrillation I will refer her to Dr. Johney Frame.   4. Hypertension.  5. COPD.

## 2013-01-26 ENCOUNTER — Ambulatory Visit (INDEPENDENT_AMBULATORY_CARE_PROVIDER_SITE_OTHER): Payer: Medicare Other | Admitting: Internal Medicine

## 2013-01-26 ENCOUNTER — Telehealth: Payer: Self-pay | Admitting: Critical Care Medicine

## 2013-01-26 ENCOUNTER — Encounter: Payer: Self-pay | Admitting: Internal Medicine

## 2013-01-26 VITALS — BP 124/80 | HR 90 | Temp 98.4°F

## 2013-01-26 DIAGNOSIS — J449 Chronic obstructive pulmonary disease, unspecified: Secondary | ICD-10-CM | POA: Diagnosis not present

## 2013-01-26 MED ORDER — CEFDINIR 300 MG PO CAPS: 300.0000 mg | ORAL_CAPSULE | Freq: Two times a day (BID) | ORAL | Status: DC

## 2013-01-26 NOTE — Patient Instructions (Addendum)
For cough >>>Take delsym two tsp every 12 hours and supplement if needed with  tramadol 50 mg up to 2 every 4 hours to suppress the urge to cough. Swallowing water or using ice chips/non mint and menthol containing candies (such as lifesavers or sugarless jolly ranchers) are also effective.  You should rest your voice and avoid activities that you know make you cough.  Once you have eliminated the cough for 3 straight days try reducing the tramadol first,  then the delsym as tolerated.  For short of breath at rest >> xopenex up to 2 puffs every 4 hours as needed    Omnicef 300 mg twice daily x 10 days  Add pepcid ac 20 mg one at bedtime as long as coughing or requiring the cough suppression  GERD (REFLUX)  is an extremely common cause of respiratory symptoms, many times with no significant heartburn at all.    It can be treated with medication, but also with lifestyle changes including avoidance of late meals, excessive alcohol, smoking cessation, and avoid fatty foods, chocolate, peppermint, colas, red wine, and acidic juices such as orange juice.  NO MINT OR MENTHOL PRODUCTS SO NO COUGH DROPS  USE SUGARLESS CANDY INSTEAD (jolley ranchers or Stover's)  NO OIL BASED VITAMINS - use powdered substitutes.    Return one week if not back to normal

## 2013-01-26 NOTE — Progress Notes (Signed)
Subjective:    Patient ID: Renee [name], female    DOB: [birth year]  MRN: [id number]  HPI  78 y.o.   female with known hx of COPD -quit smoking 09/21/09 Renee [name]   11/21/2012 Chief Complaint  Patient presents with  . Follow-up    Pt [name]/o SOB, chest tightness, runny nose with postnasal drip, prod cough with minimal green mucous.  Renee [name] is a significant issue.  rec I will let Renee [name] know you can switch to as needed Valium and stop xanax Trial Anoro one puff daily, call us results of the trial Proair as needed Return 6 months   01/26/2013 acute ov/Wert re: severe cough Chief Complaint  Patient presents with  . Acute Visit    Pt [name]/o cough x 4 days- prod with large amounts of green to yellow sputum. She also [name]/o bloody nasal d/[name] since this am.   using xopenex on avg once or twice daily on avg prior to onset of cough and 02 just at night and prn  daytime Last used saba 4 h prior to OV  With good relief of sob with min adls - worse than baseline  No obvious pattern in day to day or daytime variabilty or assoc    cp or chest tightness, subjective wheeze   or hb symptoms. No unusual exp hx or h/o childhood pna/ asthma or knowledge of premature birth.  Sleeping ok without nocturnal  or early am exacerbation  of respiratory  [name]/o's or need for noct saba. Also denies any obvious fluctuation of symptoms with weather or environmental changes or other aggravating or alleviating factors except as outlined above   Current Medications, Allergies, Complete Past Medical History, Past Surgical History, Family History, and Social History were reviewed in Renee [name].  ROS  The following are not active complaints unless bolded sore throat, dysphagia, dental problems, itching, sneezing,  nasal congestion or excess/ purulent secretions, ear ache,   fever, chills, sweats, unintended wt loss, pleuritic or exertional cp, hemoptysis,  orthopnea pnd or leg swelling, presyncope,  palpitations, heartburn, abdominal pain, anorexia, nausea, vomiting, diarrhea  or change in bowel or urinary habits, change in stools or urine, dysuria,hematuria,  rash, arthralgias, visual complaints, headache, numbness weakness or ataxia or problems with walking or coordination,  change in mood/affect or memory.         Past Medical History  Diagnosis Date  . GERD (gastroesophageal reflux disease)   . IBS (irritable bowel syndrome)   . Bundle branch block, right   . Coronary artery disease     prior CABG in August of 2011  . Emphysema/COPD   . Fatigue   . Joint pain   . Anxiety   . AF (atrial fibrillation)     Post op after CABG; off amiodarone  . HTN (hypertension)   . CHF (congestive heart failure)   . Dysphagia   . Colon polyps   . Hypertension   . Hypercholesteremia   . Normal nuclear stress test October 2012  . Myocardial infarction   . Hypothyroidism   . Palpitations      Family History  Problem Relation Age of Onset  . Heart disease Mother   . Transient ischemic attack Mother   . Hypertension Mother   . Deep vein thrombosis Mother   . Heart attack Father   . Hypertension Father   . Diabetes Father   . Heart disease Father     Heart disease before age 22  .  Hyperlipidemia Father   . Breast cancer Sister   . Cancer Sister   . Other Sister     brain tumor  . Colon polyps Brother   . Hypertension Brother   . Colon cancer Neg Hx      History   Social History  . Marital Status: Married    Spouse Name: N/A    Number of Children: 1  . Years of Education: N/A   Occupational History  . nurse     retired   Social History Main Topics  . Smoking status: Former Smoker -- 1.00 packs/day for 50 years    Types: Cigarettes    Quit date: 09/21/2009  . Smokeless tobacco: Never Used  . Alcohol Use: No  . Drug Use: No  . Sexual Activity: Not on file   Other Topics Concern  . Not on file   Social History Narrative   ** Merged History Encounter **        Daily caffeine use: Renee Reino Kent and 2 cups coffee per day     Allergies  Allergen Reactions  . Spiriva [Tiotropium Bromide Monohydrate] Other (See Comments)    Throat irritation  . Statins     Legs hurt  . Levofloxacin Nausea Only    Unable to tolerate more than 7days  . Budesonide-Formoterol Fumarate     Tachycardia  . Morphine     REACTION: double vision  . Penicillins     REACTION: swelling/rash  . Phenazopyridine Hcl     *piridium* REACTION: face red  . Pneumococcal Vaccine     REACTION: rash and cough  . Pneumococcal Vaccines Swelling    Redding of the skin  . Prednisone     REACTION: rash  . Prednisone Swelling  . Sulfonamide Derivatives     REACTION: rash  . Tylenol [Acetaminophen]     Tylenol #3 -- rash and swelling  . Penicillins Swelling and Rash  . Sulfa Antibiotics Rash       Objective:   Physical Exam  Wt Readings from Last 3 Encounters:  01/15/13 154 lb (69.854 kg)  12/04/12 156 lb (70.761 kg)  11/21/12 157 lb 3.2 oz (71.305 kg)     GEN: A/Ox3; pleasant , NAD, elderly  HEENT:  [place]/AT,  EACs-clear, TMs-wnl, NOSE mod bilateral turbinate edema  NECK:  Supple w/ fair ROM; no JVD; normal carotid impulses w/o bruits; no thyromegaly or nodules palpated; no lymphadenopathy.  RESP  Distant BS no accessory muscle use, no dullness to percussion  CARD:  RRR, no m/r/g  , no peripheral edema, pulses intact, no cyanosis or clubbing.  GI:   Soft & nt; nml bowel sounds; no organomegaly or masses detected.  Musco: Warm bil, no deformities or joint swelling noted.   Neuro: alert, no focal deficits noted.    Skin: Warm, no lesions or rashes           Assessment & Plan:

## 2013-01-26 NOTE — Telephone Encounter (Signed)
Spoke to pt. Wants appointment with PW. Reports cough since Friday. Cough is productive with yellow to clear mucus. Appointment has been made for 01/27/13 at 3pm at the [facility] office, pt was okay waiting until tomorrow.

## 2013-01-26 NOTE — Telephone Encounter (Signed)
Pt decided to schedule an earlier appt w/ MW for her cough.  Nothing further needed at this time.  Renee [name]

## 2013-01-27 ENCOUNTER — Ambulatory Visit: Payer: Medicare Other | Admitting: Critical Care Medicine

## 2013-01-29 NOTE — Assessment & Plan Note (Addendum)
DDX of  difficult airways managment all start with A and  include Adherence, Ace Inhibitors, Acid Reflux, Active Sinus Disease, Alpha 1 Antitripsin deficiency, Anxiety masquerading as Airways dz,  ABPA,  allergy(esp in young), Aspiration (esp in elderly), Adverse effects of DPI,  Active smokers, plus two Bs  = Bronchiectasis and Beta blocker use..and one C= CHF  Adherence is always the initial "prime suspect" and is a multilayered concern that requires a "trust but verify" approach in every patient - starting with knowing how to use medications, especially inhalers, correctly, keeping up with refills and understanding the fundamental difference between maintenance and prns vs those medications only taken for a very short course and then stopped and not refilled.  - The proper method of use, as well as anticipated side effects, of a metered-dose inhaler are discussed and demonstrated to the patient. Improved effectiveness after extensive coaching during this visit to a level of approximately  75%  ? Acid (or non-acid) GERD > always difficult to exclude as up to 75% of pts in some series report no assoc GI/ Heartburn symptoms> rec max (24h)  acid suppression and diet restrictions/ reviewed and instructions given in writing.  ?active/ acute sinus dz > ominicef x 10 d then ? Sinus CT next  ? Adverse effects of DPI > could not tol anoro

## 2013-02-03 ENCOUNTER — Ambulatory Visit (INDEPENDENT_AMBULATORY_CARE_PROVIDER_SITE_OTHER): Payer: Medicare Other | Admitting: Critical Care Medicine

## 2013-02-03 ENCOUNTER — Encounter: Payer: Self-pay | Admitting: Critical Care Medicine

## 2013-02-03 VITALS — BP 126/80 | HR 73 | Temp 97.7°F | Ht 66.0 in | Wt 155.0 lb

## 2013-02-03 DIAGNOSIS — J4489 Other specified chronic obstructive pulmonary disease: Secondary | ICD-10-CM

## 2013-02-03 DIAGNOSIS — J449 Chronic obstructive pulmonary disease, unspecified: Secondary | ICD-10-CM

## 2013-02-03 DIAGNOSIS — J019 Acute sinusitis, unspecified: Secondary | ICD-10-CM | POA: Diagnosis not present

## 2013-02-03 MED ORDER — BENZONATATE 100 MG PO CAPS: ORAL_CAPSULE | ORAL | Status: DC

## 2013-02-03 MED ORDER — BECLOMETHASONE DIPROPIONATE 80 MCG/ACT NA AERS: 2.0000 | INHALATION_SPRAY | Freq: Every day | NASAL | Status: DC

## 2013-02-03 MED ORDER — METHYLPREDNISOLONE ACETATE 80 MG/ML IJ SUSP
120.0000 mg | Freq: Once | INTRAMUSCULAR | Status: AC
  Administered 2013-02-03: 120 mg via INTRAMUSCULAR

## 2013-02-03 MED ORDER — CEFDINIR 300 MG PO CAPS: 300.0000 mg | ORAL_CAPSULE | Freq: Two times a day (BID) | ORAL | Status: DC

## 2013-02-03 NOTE — Progress Notes (Signed)
Subjective:    Patient ID: Renee [name], female    DOB: [birth year], 78 y.o.   MRN: [id number]  HPI 78 y.o.   female with known hx of COPD -quit smoking 09/21/09 Renee [name]   11/21/2012 Chief Complaint  Patient presents with  . Follow-up    Pt [name]/o SOB, chest tightness, runny nose with postnasal drip, prod cough with minimal green mucous.  Renee [name] is a significant issue.  rec I will let Renee [name] know you can switch to as needed Valium and stop xanax Trial Anoro one puff daily, call us results of the trial Proair as needed Return 6 months   01/26/2013 acute ov/Wert re: severe cough Chief Complaint  Patient presents with   Acute Visit   Pt [name]/o cough x 4 days- prod with large amounts of green to yellow sputum. She also [name]/o bloody nasal d/[name] since this am.         using xopenex on avg once or twice daily on avg prior to onset of cough and 02 just at night and prn  daytime Last used saba 4 h prior to OV  With good relief of sob with min adls - worse than baseline   02/03/2013 Chief Complaint  Patient presents with  . Acute Visit    Cough has improved but still not back to baseline.  Cough is prod with yellowish green mucus.  Also has nasal congestion with yellowish green mucus, chest soreness, wheezing, and increased SOB.  No fever.  Has 1 day of cefdinir left.  Pt saw Renee [name] 12/22 Rx cefdinir for sinusitis x 10d.   Pt stil lcoughing.  Pt has more mucus thick out of nose and throat. Pt notes pndrip.  Pt gets thick out of nose.Still dyspneic.  No fever. Notes some chills and cold all the time Pt had holter monitor .  Afib, and RVR.  Cardiology Rx eliquis and cardiazem. Only on Qnasl 1 puff daily  For cough using Delsym.      Review of Systems     Objective:   Physical Exam Filed Vitals:   02/03/13 1517  BP: 126/80  Pulse: 73  Temp: 97.7 F (36.5 [name])  TempSrc: Oral  Height: 5\' 6"  (1.676 m)  Weight: 155 lb (70.308 kg)  SpO2: 97%    Gen: Pleasant, well-nourished, in no  distress,  normal affect  ENT: No lesions,  mouth clear,  oropharynx clear, ++ postnasal drip Nasal purulence  Neck: No JVD, no TMG, no carotid bruits  Lungs: No use of accessory muscles, no dullness to percussion, expired wheezes  Cardiovascular: RRR, heart sounds normal, no murmur or gallops, no peripheral edema  Abdomen: soft and NT, no HSM,  BS normal  Musculoskeletal: No deformities, no cyanosis or clubbing  Neuro: alert, non focal  Skin: Warm, no lesions or rashes  No results found.        Assessment & Plan:   COPD Gold [name] Gold stage [name]. COPD now with chronic and acute sinusitis Plan Increase Q nasl two puff ea nostril daily Take 10 more days of Cefdinir 300mg  twice daily A depomedrol 120mg  Injection was given Use xopenex as needed Use saline nasal rinse twice daily for 10days Benzonatate 1-2 every 4-6 hours as needed for cough Return 6 weeks    Updated Medication List Outpatient Encounter Prescriptions as of 02/03/2013  Medication Sig  . ALPRAZolam (XANAX) 0.25 MG tablet Take 1 tablet by mouth as needed.  [name] apixaban (ELIQUIS) 5 MG TABS  tablet Take 1 tablet (5 mg total) by mouth 2 (two) times daily.  . Beclomethasone Dipropionate (QNASL) 80 MCG/ACT AERS Place 2 puffs into both nostrils daily.  . cefdinir (OMNICEF) 300 MG capsule Take 1 capsule (300 mg total) by mouth 2 (two) times daily.  . Cyanocobalamin (VITAMIN B 12) 100 MCG LOZG Inject into the skin every 30 (thirty) days.  [name] diltiazem (CARDIZEM CD) 240 MG 24 hr capsule Take 1 capsule (240 mg total) by mouth daily.  [name] esomeprazole (NEXIUM) 40 MG capsule Take 40 mg by mouth daily before breakfast.    . furosemide (LASIX) 40 MG tablet TAKE 1 TABLET BY MOUTH TWICE DAILY  . levothyroxine (SYNTHROID, LEVOTHROID) 25 MCG tablet Take 1 tablet by mouth daily.  . metoprolol tartrate (LOPRESSOR) 25 MG tablet TAKE 1 TABLET BY MOUTH TWICE DAILY  . nitroGLYCERIN (NITROSTAT) 0.4 MG SL tablet PLACE 1 TABLET UNDER THE  TONGUE EVERY 5 MINUTES AS NEEDED FOR CHEST PAIN  . NON FORMULARY as needed. Oxygen - 3L pulse with extertion, 2L continuious QHS.  Renee [name] Glycol-Propyl Glycol (SYSTANE OP) Apply 1 drop to eye daily.    . potassium chloride SA (K-DUR,KLOR-CON) 20 MEQ tablet Take 1 tablet (20 mEq total) by mouth 2 (two) times daily.  . traMADol (ULTRAM) 50 MG tablet Take 1 tablet (50 mg total) by mouth every 6 (six) hours as needed. Pain  . VITAMIN D, CHOLECALCIFEROL, PO Take 1 tablet by mouth daily.    Renee [name] HFA 45 MCG/ACT inhaler AS DIRECTED  . [DISCONTINUED] Beclomethasone Diprop, Nasal, (QNASL NA) Place 1 puff into the nose daily.  . [DISCONTINUED] cefdinir (OMNICEF) 300 MG capsule Take 1 capsule (300 mg total) by mouth 2 (two) times daily.  . [DISCONTINUED] UNABLE TO FIND Med Name: Qnasal 1 spray each nostril once daily  . benzonatate (TESSALON) 100 MG capsule Take 1-2 every 4-6 hours as needed for cough  . [EXPIRED] methylPREDNISolone acetate (DEPO-MEDROL) injection 120 mg

## 2013-02-03 NOTE — Patient Instructions (Signed)
Increase Q nasl two puff ea nostril daily Take 10 more days of Cefdinir 300mg  twice daily A depomedrol 120mg  Injection was given Use xopenex as needed Use saline nasal rinse twice daily for 10days Benzonatate 1-2 every 4-6 hours as needed for cough Return 6 weeks

## 2013-02-03 NOTE — Assessment & Plan Note (Signed)
Gold stage C. COPD now with chronic and acute sinusitis Plan Increase Q nasl two puff ea nostril daily Take 10 more days of Cefdinir 300mg  twice daily A depomedrol 120mg  Injection was given Use xopenex as needed Use saline nasal rinse twice daily for 10days Benzonatate 1-2 every 4-6 hours as needed for cough Return 6 weeks

## 2013-02-15 ENCOUNTER — Other Ambulatory Visit: Payer: Self-pay | Admitting: Cardiology

## 2013-02-16 ENCOUNTER — Other Ambulatory Visit: Payer: Self-pay

## 2013-02-16 MED ORDER — METOPROLOL TARTRATE 25 MG PO TABS
ORAL_TABLET | ORAL | Status: DC
Start: 2013-02-16 — End: 2013-03-17

## 2013-03-03 DIAGNOSIS — R040 Epistaxis: Secondary | ICD-10-CM | POA: Diagnosis not present

## 2013-03-03 DIAGNOSIS — I4891 Unspecified atrial fibrillation: Secondary | ICD-10-CM | POA: Diagnosis not present

## 2013-03-03 DIAGNOSIS — Z7901 Long term (current) use of anticoagulants: Secondary | ICD-10-CM | POA: Diagnosis not present

## 2013-03-03 DIAGNOSIS — R51 Headache: Secondary | ICD-10-CM | POA: Diagnosis not present

## 2013-03-03 DIAGNOSIS — H7200 Central perforation of tympanic membrane, unspecified ear: Secondary | ICD-10-CM | POA: Diagnosis not present

## 2013-03-16 ENCOUNTER — Encounter: Payer: Self-pay | Admitting: Internal Medicine

## 2013-03-17 ENCOUNTER — Encounter: Payer: Self-pay | Admitting: Critical Care Medicine

## 2013-03-17 ENCOUNTER — Telehealth: Payer: Self-pay

## 2013-03-17 ENCOUNTER — Ambulatory Visit (INDEPENDENT_AMBULATORY_CARE_PROVIDER_SITE_OTHER): Payer: Medicare Other | Admitting: Critical Care Medicine

## 2013-03-17 ENCOUNTER — Ambulatory Visit (HOSPITAL_BASED_OUTPATIENT_CLINIC_OR_DEPARTMENT_OTHER)
Admission: RE | Admit: 2013-03-17 | Discharge: 2013-03-17 | Disposition: A | Discharge status: Home / Self Care | Payer: Medicare Other | Source: Ambulatory Visit | Attending: Critical Care Medicine | Admitting: Critical Care Medicine

## 2013-03-17 VITALS — BP 130/70 | HR 96 | Temp 98.0°F | Ht 66.0 in | Wt 154.0 lb

## 2013-03-17 DIAGNOSIS — J449 Chronic obstructive pulmonary disease, unspecified: Secondary | ICD-10-CM | POA: Diagnosis not present

## 2013-03-17 DIAGNOSIS — J40 Bronchitis, not specified as acute or chronic: Secondary | ICD-10-CM | POA: Diagnosis not present

## 2013-03-17 DIAGNOSIS — J9819 Other pulmonary collapse: Secondary | ICD-10-CM | POA: Diagnosis not present

## 2013-03-17 DIAGNOSIS — Z951 Presence of aortocoronary bypass graft: Secondary | ICD-10-CM | POA: Diagnosis not present

## 2013-03-17 DIAGNOSIS — J439 Emphysema, unspecified: Secondary | ICD-10-CM

## 2013-03-17 DIAGNOSIS — J438 Other emphysema: Secondary | ICD-10-CM | POA: Diagnosis not present

## 2013-03-17 MED ORDER — ACLIDINIUM BROMIDE 400 MCG/ACT IN AEPB: 1.0000 | INHALATION_SPRAY | Freq: Two times a day (BID) | RESPIRATORY_TRACT | Status: DC

## 2013-03-17 MED ORDER — DILTIAZEM HCL ER COATED BEADS 240 MG PO CP24: ORAL_CAPSULE | ORAL | Status: DC

## 2013-03-17 MED ORDER — CEFDINIR 300 MG PO CAPS: 300.0000 mg | ORAL_CAPSULE | Freq: Two times a day (BID) | ORAL | Status: DC

## 2013-03-17 NOTE — Progress Notes (Signed)
Subjective:    Patient ID: Renee [name], female    DOB: [birth year], 79 y.o.   MRN: [id number]  HPI  79 y.o.   female with known hx of COPD -quit smoking 09/21/09 Renee [name]   03/17/2013 Chief Complaint  Patient presents with  . 6 wk follow up    Over the past 1-2 wks, notices weakness with any activity with increased pulse rate.  Pulse rate 142 today upon ariveal to exam room.  Breathing at baseline with rest.  Temp 100.8 x 2 nights ago.  No cough.    Notices more tachycardia with any exertion. No real chest pain.  Pt hurts all over.  Pt had T 100.8.  No more cough. Portable oxygen only lasts one hour.  HPMS.    Review of Systems      Objective:   Physical Exam  Filed Vitals:   03/17/13 1119 03/17/13 1123  BP:  130/70  Pulse: 142 96  Temp:  98 F (36.7 [name])  TempSrc:  Oral  Height:  5\' 6"  (1.676 m)  Weight:  154 lb (69.854 kg)  SpO2:  98%    Gen: Pleasant, well-nourished, in no distress,  normal affect  ENT: No lesions,  mouth clear,  oropharynx clear, ++ postnasal drip Nasal purulence  Neck: No JVD, no TMG, no carotid bruits  Lungs: No use of accessory muscles, no dullness to percussion, expired wheezes  Cardiovascular: RRR, heart sounds normal, no murmur or gallops, no peripheral edema  Abdomen: soft and NT, no HSM,  BS normal  Musculoskeletal: No deformities, no cyanosis or clubbing  Neuro: alert, non focal  Skin: Warm, no lesions or rashes  Dg Chest 2 View  03/17/2013   CLINICAL DATA:  Bronchitis  EXAM: CHEST  2 VIEW  COMPARISON:  Chest radiograph 06/02/2012  FINDINGS: There are changes of median sternotomy for CABG. The heart, mediastinal, and hilar contours are within normal limits. There is a linear band of atelectasis in the left mid lung and left lung base. Lung volumes are slightly low and there is chronic mild elevation of the left hemidiaphragm. No airspace disease, pleural effusion, or pneumothorax is identified. No acute bony abnormality is seen.   IMPRESSION: Bandlike atelectasis center scarring in the left mid lung/left lung base. Otherwise, no acute findings.  Status post CABG.   Electronically Signed   By: Curlene Dolphin M.D.   On: 03/17/2013 13:16          Assessment & Plan:   COPD Gold [name] Gold [name] Copd frequent exacerbations Plan Trial Turdorza one puff twice daily Cefdinir one twice daily  Note : CXR showed bronchitis only    Updated Medication List Outpatient Encounter Prescriptions as of 03/17/2013  Medication Sig  . ALPRAZolam (XANAX) 0.25 MG tablet Take 1 tablet by mouth as needed.  [name] apixaban (ELIQUIS) 5 MG TABS tablet Take 1 tablet (5 mg total) by mouth 2 (two) times daily.  . Beclomethasone Dipropionate (QNASL) 80 MCG/ACT AERS Place 2 puffs into both nostrils daily.  . benzonatate (TESSALON) 100 MG capsule Take 1-2 every 4-6 hours as needed for cough  . Cyanocobalamin (VITAMIN B 12) 100 MCG LOZG Inject into the skin. ON HOLD  . esomeprazole (NEXIUM) 40 MG capsule Take 40 mg by mouth daily before breakfast.    . furosemide (LASIX) 40 MG tablet TAKE 1 TABLET BY MOUTH TWICE DAILY  . metoprolol tartrate (LOPRESSOR) 25 MG tablet TAKE 1 TABLET BY MOUTH TWICE DAILY  . mupirocin  ointment (BACTROBAN) 2 % Twice daily to left nare  . nitroGLYCERIN (NITROSTAT) 0.4 MG SL tablet PLACE 1 TABLET UNDER THE TONGUE EVERY 5 MINUTES AS NEEDED FOR CHEST PAIN  . NON FORMULARY as needed. Oxygen - 3L pulse with extertion, 2L continuious QHS.  [name] Glycol-Propyl Glycol (SYSTANE OP) Apply 1 drop to eye daily.    . potassium chloride SA (K-DUR,KLOR-CON) 20 MEQ tablet Take 1 tablet (20 mEq total) by mouth 2 (two) times daily.  . traMADol (ULTRAM) 50 MG tablet Take 1 tablet (50 mg total) by mouth every 6 (six) hours as needed. Pain  . VITAMIN D, CHOLECALCIFEROL, PO Take 1 tablet by mouth daily.    Penne Lash HFA 45 MCG/ACT inhaler Inhale 2 puffs into the lungs. AS DIRECTED  . [DISCONTINUED] diltiazem (CARDIZEM CD) 240 MG 24 hr capsule  Take 1 capsule (240 mg total) by mouth daily.  . [DISCONTINUED] levothyroxine (SYNTHROID, LEVOTHROID) 25 MCG tablet Take 1 tablet by mouth daily.  . [DISCONTINUED] metoprolol tartrate (LOPRESSOR) 25 MG tablet TAKE 1 TABLET BY MOUTH TWICE DAILY  . Aclidinium Bromide (TUDORZA PRESSAIR) 400 MCG/ACT AEPB Inhale 1 puff into the lungs 2 (two) times daily.  . cefdinir (OMNICEF) 300 MG capsule Take 1 capsule (300 mg total) by mouth 2 (two) times daily.  . [DISCONTINUED] Aclidinium Bromide (TUDORZA PRESSAIR) 400 MCG/ACT AEPB Inhale 1 puff into the lungs 2 (two) times daily.  . [DISCONTINUED] cefdinir (OMNICEF) 300 MG capsule Take 1 capsule (300 mg total) by mouth 2 (two) times daily.

## 2013-03-17 NOTE — Telephone Encounter (Signed)
Received call back from patient she stated she did not want to increase Diltiazem to 240 mg twice a day.Stated she wanted appointment first.Appointment scheduled with Truitt Merle NP 03/18/13 at 2:00 pm.

## 2013-03-17 NOTE — Telephone Encounter (Signed)
Received message from [place] he received message from Dr.Wright about elevated heart rate 140 with activity.Advised to increase Diltiazem to 240 mg twice a day.Advised needs appointment in 2 weeks with him or Cecille Rubin.Patient stated she is in car and will call me back to schedule appointment.

## 2013-03-17 NOTE — Patient Instructions (Signed)
I have asked Cardiology to see you soon for heart rate issues Trial Turdorza one puff twice daily Cefdinir one twice daily  Chest xray today xopenex as needed We will inquire as to availability of Activox portable concentrator Return 2 months

## 2013-03-18 ENCOUNTER — Encounter: Payer: Self-pay | Admitting: Nurse Practitioner

## 2013-03-18 ENCOUNTER — Ambulatory Visit (INDEPENDENT_AMBULATORY_CARE_PROVIDER_SITE_OTHER): Payer: Medicare Other | Admitting: Nurse Practitioner

## 2013-03-18 VITALS — BP 120/72 | HR 82 | Ht 66.0 in | Wt 153.8 lb

## 2013-03-18 DIAGNOSIS — I4891 Unspecified atrial fibrillation: Secondary | ICD-10-CM | POA: Diagnosis not present

## 2013-03-18 DIAGNOSIS — I480 Paroxysmal atrial fibrillation: Secondary | ICD-10-CM

## 2013-03-18 MED ORDER — DILTIAZEM HCL ER COATED BEADS 240 MG PO CP24: ORAL_CAPSULE | ORAL | Status: DC

## 2013-03-18 NOTE — Patient Instructions (Signed)
Stay on your current medicines  You may take an extra 240 mg of Diltiazem daily IF your heart rate is elevated above 110  I will see you in a month  Call the [facility] office at [phone] if you have any questions, problems or concerns.

## 2013-03-18 NOTE — Progress Notes (Signed)
Renee [name] Cover Date of Birth: [birth year] Medical Record #[id number]  History of Present Illness: Renee [name] is seen back today for a work in visit. Seen for Dr. Martinique. She has paroxysmal atrial fibrillation, known CAD with past CABG per [facility] in August of 2011. Other issues include chronic diastolic heart failure, COPD, sinusitis, GERD, RBBB, anxiety, HTN, HLD and hypothyroidism.   She had a Myoview study in April 2014 which was normal. An echocardiogram at that time demonstrated normal LV function. She had mild mitral insufficiency without evidence of prolapse.   In October of 2014 she wore an event monitor. This demonstrated episodes of rapid Afib with rates up to 190 bpm. We added diltiazem 240 mg daily to metoprolol. The first 3 weeks she felt much better with resting HR around 60. Since then HR has been higher- in the 90s but she is no longer having the very rapid episodes. She still notes symptoms of SOB and chest pain when HR is elevated.   Was at Dr. Bettina [name] office for follow up - has had recent URI - noted HR over 140. She notes that her heart rate had been elevated for at least a week or two. She has an oximeter at home to check her oxygen level and it tells her her heart rate. She did not call or take any extra medicine. Dr. Joya [name] messaged Dr. [place] who advised increasing the Diltiazem to BID - she did not wish to do this without being seen. Thus added to my schedule today.    Comes in today. Here with her husband. She notes that she feels better today. HR is better today. Her cold is getting better. No chest pain. Her sternum is still quite tender.    Current Outpatient Prescriptions  Medication Sig Dispense Refill  . Aclidinium Bromide (TUDORZA PRESSAIR) 400 MCG/ACT AEPB Inhale 1 puff into the lungs 2 (two) times daily.  1 each  0  . ALPRAZolam (XANAX) 0.25 MG tablet Take 1 tablet by mouth as needed.      [name] apixaban (ELIQUIS) 5 MG TABS tablet Take 1 tablet (5 mg total) by mouth 2  (two) times daily.  60 tablet  11  . Beclomethasone Dipropionate (QNASL) 80 MCG/ACT AERS Place 2 puffs into both nostrils daily.  8.7 g  4  . benzonatate (TESSALON) 100 MG capsule Take 1-2 every 4-6 hours as needed for cough  90 capsule  4  . cefdinir (OMNICEF) 300 MG capsule Take 1 capsule (300 mg total) by mouth 2 (two) times daily.  20 capsule  0  . diltiazem (CARDIZEM CD) 240 MG 24 hr capsule May take an extra 240 mg daily if HR greater than 110  60 capsule  6  . esomeprazole (NEXIUM) 40 MG capsule Take 40 mg by mouth daily before breakfast.        . furosemide (LASIX) 40 MG tablet TAKE 1 TABLET BY MOUTH TWICE DAILY  60 tablet  5  . levothyroxine (SYNTHROID, LEVOTHROID) 25 MCG tablet Take 25 mcg by mouth daily before breakfast.       . metoprolol tartrate (LOPRESSOR) 25 MG tablet TAKE 1 TABLET BY MOUTH TWICE DAILY  60 tablet  1  . mupirocin ointment (BACTROBAN) 2 % Twice daily to left nare      . nitroGLYCERIN (NITROSTAT) 0.4 MG SL tablet PLACE 1 TABLET UNDER THE TONGUE EVERY 5 MINUTES AS NEEDED FOR CHEST PAIN  25 tablet  3  . NON FORMULARY as needed. Oxygen - 3L  pulse with extertion, 2L continuious QHS.      Bertram Gala Glycol-Propyl Glycol (SYSTANE OP) Apply 1 drop to eye daily.        . potassium chloride SA (K-DUR,KLOR-CON) 20 MEQ tablet Take 1 tablet (20 mEq total) by mouth 2 (two) times daily.  60 tablet  6  . traMADol (ULTRAM) 50 MG tablet Take 1 tablet (50 mg total) by mouth every 6 (six) hours as needed. Pain  30 tablet  0  . VITAMIN D, CHOLECALCIFEROL, PO Take 1 tablet by mouth daily.        [name] HFA 45 MCG/ACT inhaler Inhale 2 puffs into the lungs. AS DIRECTED      . Cyanocobalamin (VITAMIN B 12) 100 MCG LOZG Inject into the skin. ON HOLD      . [DISCONTINUED] budesonide-formoterol (SYMBICORT) 160-4.5 MCG/ACT inhaler Inhale 2 puffs into the lungs 2 (two) times daily.  1 Inhaler  12  . [DISCONTINUED] calcium citrate-vitamin D (CITRACAL+D) 315-200 MG-UNIT per tablet Take 1 tablet  by mouth 2 (two) times daily.         No current facility-administered medications for this visit.    Allergies  Allergen Reactions  . Spiriva [Tiotropium Bromide Monohydrate] Other (See Comments)    Throat irritation  . Statins     Legs hurt  . Levofloxacin Nausea Only    Unable to tolerate more than 7days  . Budesonide-Formoterol Fumarate     Tachycardia  . Morphine     REACTION: double vision  . Penicillins     REACTION: swelling/rash  . Phenazopyridine Hcl     *piridium* REACTION: face red  . Pneumococcal Vaccine     REACTION: rash and cough  . Pneumococcal Vaccines Swelling    [place] of the skin  . Prednisone     REACTION: rash  . Prednisone Swelling  . Sulfonamide Derivatives     REACTION: rash  . Tylenol [Acetaminophen]     Tylenol #3 -- rash and swelling  . Penicillins Swelling and Rash  . Sulfa Antibiotics Rash    Past Medical History  Diagnosis Date  . GERD (gastroesophageal reflux disease)   . IBS (irritable bowel syndrome)   . Bundle branch block, right   . Coronary artery disease     prior CABG in August of 2011  . Emphysema/COPD   . Fatigue   . Joint pain   . Anxiety   . AF (atrial fibrillation)     Post op after CABG; off amiodarone  . HTN (hypertension)   . CHF (congestive heart failure)   . Dysphagia   . Colon polyps   . Hypertension   . Hypercholesteremia   . Normal nuclear stress test October 2012  . Myocardial infarction   . Hypothyroidism   . Palpitations     Past Surgical History  Procedure Laterality Date  . Coronary artery bypass graft    . Total hip arthroplasty  11/01/2008    right  . Appendectomy  1995  . Cholecystectomy  2008    LARYNX  . Tonsillectomy and adenoidectomy    . Total abdominal hysterectomy  1986  . Cardiac surgery    . Joint replacement      Right Hip 2010    History  Smoking status  . Former Smoker -- 1.00 packs/day for 50 years  . Types: Cigarettes  . Quit date: 09/21/2009  Smokeless tobacco    . Never Used    History  Alcohol Use No  Family History  Problem Relation Age of Onset  . Heart disease Mother   . Transient ischemic attack Mother   . Hypertension Mother   . Deep vein thrombosis Mother   . Heart attack Father   . Hypertension Father   . Diabetes Father   . Heart disease Father     Heart disease before age 45  . Hyperlipidemia Father   . Breast cancer Sister   . Cancer Sister   . Other Sister     brain tumor  . Colon polyps Brother   . Hypertension Brother   . Colon cancer Daughter     Review of Systems: The review of systems is per the HPI.  All other systems were reviewed and are negative.  Physical Exam: BP 120/72  Pulse 82  Ht 5\' 6"  (1.676 m)  Wt 153 lb 12.8 oz (69.763 kg)  BMI 24.84 kg/m2  SpO2 98% Patient is very pleasant and in no acute distress. Skin is warm and dry. Color is normal.  HEENT is unremarkable. Normocephalic/atraumatic. PERRL. Sclera are nonicteric. Neck is supple. No masses. No JVD. Lungs are clear. Cardiac exam shows a regular rate and rhythm. Split S2 noted. Sternum still tender to touch.  Abdomen is soft. Extremities are without edema. Gait and ROM are intact. No gross neurologic deficits noted.  LABORATORY DATA: EKG today shows sinus with RBBB. Rate is 82.  Lab Results  Component Value Date   WBC 9.8 03/17/2011   HGB 13.4 03/17/2011   HCT 43.3 03/17/2011   PLT 217 09/25/2009   GLUCOSE 86 12/26/2010   CHOL  Value: 207        ATP III CLASSIFICATION:  <200     mg/dL   Desirable  200-239  mg/dL   Borderline High  >=240    mg/dL   High* 07/16/2007   TRIG 116 07/16/2007   HDL 45 07/16/2007   LDLCALC  Value: 139        Total Cholesterol/HDL:CHD Risk Coronary Heart Disease Risk Table                     Men   Women  1/2 Average Risk   3.4   3.3* 07/16/2007   ALT 15 09/21/2009   AST 23 09/21/2009   NA 140 12/26/2010   K 4.0 12/26/2010   CL 100 12/26/2010   CREATININE 1.1 12/26/2010   BUN 14 12/26/2010   CO2 29 12/26/2010   TSH  0.756 09/22/2009   INR 1.22 09/21/2009   HGBA1C  Value: 5.7 (NOTE)                                                                       According to the ADA Clinical Practice Recommendations for 2011, when HbA1c is used as a screening test:   >=6.5%   Diagnostic of Diabetes Mellitus           (if abnormal result  is confirmed)  5.7-6.4%   Increased risk of developing Diabetes Mellitus  References:Diagnosis and Classification of Diabetes Mellitus,Diabetes [id number] 1):S62-S69 and Standards of Medical Care in         Diabetes - 2011,Diabetes Care,2011,34  (Suppl 1):S11-S61.* 09/19/2009    Assessment / Plan:  1. CAD - with past CABG - no ischemia noted on Myoview study from 2014. Would continue with medical management.   2. Dyspnea - echocardiogram and Myoview study have been unremarkable. It is suspected that her shortness of breath his more related to her COPD - she does remain on oxygen and using more regularly.   3. Paroxysmal atrial fibrillation. CHAD-VASC score of 4.On Eliquis 5 mg twice a day. She was to be tracking her HR for [place] - I have asked her to take an extra dose of her Diltiazem daily if HR above 110. Will see her back in a month to reassess. From the standpoint of antiarrhythmic drug therapy her options are very limited. She has previously been intolerant of amiodarone. She is not a candidate for flecainide with her history of coronary disease. Her QTC was 479 ms with only an incomplete right bundle branch block so she would not be a candidate for Tikosyn. I don't think she would tolerate sotalol well at all. It may be, her only option is that of AVN ablation with PPM implant - she is not wanting to go that route. May have refer her to Dr. Rayann Heman.   4. Hypertension. BP looks good on current regimen.  5. COPD - followed by Dr. Joya [name].  Patient is agreeable to this plan and will call if any problems develop in the interim.   Burtis Junes, RN, [facility] [address] [place], [place]  [phone]

## 2013-03-19 ENCOUNTER — Encounter: Payer: Self-pay | Admitting: Cardiology

## 2013-03-19 NOTE — Assessment & Plan Note (Signed)
Gold C Copd frequent exacerbations Plan Trial Turdorza one puff twice daily Cefdinir one twice daily  Note : CXR showed bronchitis only

## 2013-03-19 NOTE — Progress Notes (Signed)
Quick Note:  Called, spoke with pt. Informed her of cxr results and recs per Dr. Wright. She verbalized understanding and voiced no further questions or concerns at this time. ______ 

## 2013-03-19 NOTE — Progress Notes (Signed)
Quick Note:  Notify the patient that the Xray is stable and no pneumonia, just bronchitis No change in medications are recommended. Continue current meds as prescribed at last office visit ______

## 2013-04-20 ENCOUNTER — Ambulatory Visit (INDEPENDENT_AMBULATORY_CARE_PROVIDER_SITE_OTHER): Payer: Medicare Other | Admitting: Nurse Practitioner

## 2013-04-20 ENCOUNTER — Encounter: Payer: Self-pay | Admitting: Nurse Practitioner

## 2013-04-20 VITALS — BP 118/70 | HR 68 | Ht 66.0 in | Wt 154.8 lb

## 2013-04-20 DIAGNOSIS — I2510 Atherosclerotic heart disease of native coronary artery without angina pectoris: Secondary | ICD-10-CM | POA: Diagnosis not present

## 2013-04-20 DIAGNOSIS — R0989 Other specified symptoms and signs involving the circulatory and respiratory systems: Secondary | ICD-10-CM

## 2013-04-20 DIAGNOSIS — I480 Paroxysmal atrial fibrillation: Secondary | ICD-10-CM

## 2013-04-20 DIAGNOSIS — R0609 Other forms of dyspnea: Secondary | ICD-10-CM

## 2013-04-20 DIAGNOSIS — I4891 Unspecified atrial fibrillation: Secondary | ICD-10-CM

## 2013-04-20 DIAGNOSIS — R0600 Dyspnea, unspecified: Secondary | ICD-10-CM

## 2013-04-20 NOTE — Patient Instructions (Signed)
See me in 3 months  Stay on your current medicines  Call the [facility] office at [phone] if you have any questions, problems or concerns.

## 2013-04-20 NOTE — Progress Notes (Signed)
Renee [name] Cover Date of Birth: 04/18/1934 Medical Record #[id number]  History of Present Illness: Renee [name] is seen back today for a one month visit. Seen for Dr. Martinique. She has paroxysmal atrial fibrillation, known CAD with past CABG per [facility] in August of 2011. Other issues include chronic diastolic heart failure, COPD, sinusitis, GERD, RBBB, anxiety, HTN, HLD and hypothyroidism.   She had a Myoview study in April 2014 which was normal. An echocardiogram at that time demonstrated normal LV function. She had mild mitral insufficiency without evidence of prolapse.   In October of 2014 she wore an event monitor. This demonstrated episodes of rapid Afib with rates up to 190 bpm. diltiazem 240 mg daily was added to metoprolol. The first 3 weeks she felt much better with resting HR around 60. Since then HR has been higher- in the 90s but she is no longer having the very rapid episodes. She still notes symptoms of SOB and chest pain when HR is elevated.   Was at Dr. Bettina Gavia office for follow up - had had recent URI - noted HR over 140. She noted that her heart rate had been elevated for at least a week or two. She had an oximeter at home to check her oxygen level and it tells her her heart rate. She did not call or take any extra medicine. Dr. Joya Gaskins messaged Dr. Martinique who advised increasing the Diltiazem to BID - she did not wish to do this without being seen. When I saw her, she was doing fine - we did not increase her medicine and opted instead to just extra prn.   Comes back today. Here with her husband. She has had a busy month. Husband recently in the ICU with possible pneumonia. Daughter with colon cancer. She notes that her breathing remains her most limiting factor. HR has been in the 90's and even down to the 70's this past week. No heart racing. No extra Diltiazem used.   Current Outpatient Prescriptions  Medication Sig Dispense Refill  . Aclidinium Bromide (TUDORZA PRESSAIR) 400 MCG/ACT AEPB  Inhale 1 puff into the lungs 2 (two) times daily.  1 each  0  . ALPRAZolam (XANAX) 0.25 MG tablet Take 1 tablet by mouth as needed.      [name] apixaban (ELIQUIS) 5 MG TABS tablet Take 1 tablet (5 mg total) by mouth 2 (two) times daily.  60 tablet  11  . Beclomethasone Dipropionate (QNASL) 80 MCG/ACT AERS Place 2 puffs into both nostrils daily.  8.7 g  4  . cefdinir (OMNICEF) 300 MG capsule Take 1 capsule (300 mg total) by mouth 2 (two) times daily.  20 capsule  0  . Cyanocobalamin (VITAMIN B 12) 100 MCG LOZG Inject into the skin. ON HOLD      . diltiazem (CARDIZEM CD) 240 MG 24 hr capsule May take an extra 240 mg daily if HR greater than 110  60 capsule  6  . esomeprazole (NEXIUM) 40 MG capsule Take 40 mg by mouth daily before breakfast.        . furosemide (LASIX) 40 MG tablet TAKE 1 TABLET BY MOUTH TWICE DAILY  60 tablet  5  . levothyroxine (SYNTHROID, LEVOTHROID) 25 MCG tablet Take 25 mcg by mouth daily before breakfast.       . metoprolol tartrate (LOPRESSOR) 25 MG tablet TAKE 1 TABLET BY MOUTH TWICE DAILY  60 tablet  1  . nitroGLYCERIN (NITROSTAT) 0.4 MG SL tablet PLACE 1 TABLET UNDER THE TONGUE EVERY  5 MINUTES AS NEEDED FOR CHEST PAIN  25 tablet  3  . NON FORMULARY as needed. Oxygen - 3L pulse with extertion, 2L continuious QHS.      [name] Glycol-Propyl Glycol (SYSTANE OP) Apply 1 drop to eye daily.        . potassium chloride SA (K-DUR,KLOR-CON) 20 MEQ tablet Take 1 tablet (20 mEq total) by mouth 2 (two) times daily.  60 tablet  6  . traMADol (ULTRAM) 50 MG tablet Take 1 tablet (50 mg total) by mouth every 6 (six) hours as needed. Pain  30 tablet  0  . VITAMIN D, CHOLECALCIFEROL, PO Take 1 tablet by mouth daily.        . [DISCONTINUED] budesonide-formoterol (SYMBICORT) 160-4.5 MCG/ACT inhaler Inhale 2 puffs into the lungs 2 (two) times daily.  1 Inhaler  12  . [DISCONTINUED] calcium citrate-vitamin D (CITRACAL+D) 315-200 MG-UNIT per tablet Take 1 tablet by mouth 2 (two) times daily.           No current facility-administered medications for this visit.    Allergies  Allergen Reactions  . Spiriva [Tiotropium Bromide Monohydrate] Other (See Comments)    Throat irritation  . Statins     Legs hurt  . Levofloxacin Nausea Only    Unable to tolerate more than 7days  . Budesonide-Formoterol Fumarate     Tachycardia  . Morphine     REACTION: double vision  . Penicillins     REACTION: swelling/rash  . Phenazopyridine Hcl     *piridium* REACTION: face red  . Pneumococcal Vaccine     REACTION: rash and cough  . Pneumococcal Vaccines Swelling    Redding of the skin  . Prednisone     REACTION: rash  . Prednisone Swelling  . Sulfonamide Derivatives     REACTION: rash  . Tylenol [Acetaminophen]     Tylenol #3 -- rash and swelling  . Penicillins Swelling and Rash  . Sulfa Antibiotics Rash    Past Medical History  Diagnosis Date  . GERD (gastroesophageal reflux disease)   . IBS (irritable bowel syndrome)   . Bundle branch block, right   . Coronary artery disease     prior CABG in August of 2011  . Emphysema/COPD   . Fatigue   . Joint pain   . Anxiety   . AF (atrial fibrillation)     Post op after CABG; off amiodarone  . HTN (hypertension)   . CHF (congestive heart failure)   . Dysphagia   . Colon polyps   . Hypertension   . Hypercholesteremia   . Normal nuclear stress test October 2012  . Myocardial infarction   . Hypothyroidism   . Palpitations     Past Surgical History  Procedure Laterality Date  . Coronary artery bypass graft    . Total hip arthroplasty  11/01/2008    right  . Appendectomy  1995  . Cholecystectomy  2008    LARYNX  . Tonsillectomy and adenoidectomy    . Total abdominal hysterectomy  1986  . Cardiac surgery    . Joint replacement      Right Hip 2010    History  Smoking status  . Former Smoker -- 1.00 packs/day for 50 years  . Types: Cigarettes  . Quit date: 09/21/2009  Smokeless tobacco  . Never Used    History   Alcohol Use No    Family History  Problem Relation Age of Onset  . Heart disease Mother   .  Transient ischemic attack Mother   . Hypertension Mother   . Deep vein thrombosis Mother   . Heart attack Father   . Hypertension Father   . Diabetes Father   . Heart disease Father     Heart disease before age 80  . Hyperlipidemia Father   . Breast cancer Sister   . Cancer Sister   . Other Sister     brain tumor  . Colon polyps Brother   . Hypertension Brother   . Colon cancer Daughter     Review of Systems: The review of systems is per the HPI.  All other systems were reviewed and are negative.  Physical Exam: BP 118/70  Pulse 68  Ht 5\' 6"  (1.676 m)  Wt 154 lb 12.8 oz (70.217 kg)  BMI 25.00 kg/m2  SpO2 98% Patient is very pleasant and in no acute distress. She actually looks pretty good today. kin is warm and dry. Color is normal.  HEENT is unremarkable. Normocephalic/atraumatic. PERRL. Sclera are nonicteric. Neck is supple. No masses. No JVD. Lungs are clear. Cardiac exam shows a regular rate and rhythm. Abdomen is soft. Extremities are without edema. Multiple varicosities noted on both legs.  Gait and ROM are intact. No gross neurologic deficits noted.  LABORATORY DATA: Lab Results  Component Value Date   WBC 9.8 03/17/2011   HGB 13.4 03/17/2011   HCT 43.3 03/17/2011   PLT 217 09/25/2009   GLUCOSE 86 12/26/2010   CHOL  Value: 207        ATP III CLASSIFICATION:  <200     mg/dL   Desirable  757-972  mg/dL   Borderline High  >=820    mg/dL   High* 07/06/5613   TRIG 116 07/16/2007   HDL 45 07/16/2007   LDLCALC  Value: 139        Total Cholesterol/HDL:CHD Risk Coronary Heart Disease Risk Table                     Men   Women  1/2 Average Risk   3.4   3.3* 07/16/2007   ALT 15 09/21/2009   AST 23 09/21/2009   NA 140 12/26/2010   K 4.0 12/26/2010   CL 100 12/26/2010   CREATININE 1.1 12/26/2010   BUN 14 12/26/2010   CO2 29 12/26/2010   TSH 0.756 09/22/2009   INR 1.22 09/21/2009   HGBA1C   Value: 5.7 (NOTE)                                                                       According to the ADA Clinical Practice Recommendations for 2011, when HbA1c is used as a screening test:   >=6.5%   Diagnostic of Diabetes Mellitus           (if abnormal result  is confirmed)  5.7-6.4%   Increased risk of developing Diabetes Mellitus  References:Diagnosis and Classification of Diabetes Mellitus,Diabetes Care,2011,34(Suppl 1):S62-S69 and Standards of Medical Care in         Diabetes - 2011,Diabetes Care,2011,34  (Suppl 1):S11-S61.* 09/19/2009    Assessment / Plan: 1. CAD - with past CABG - no ischemia noted on Myoview study from 2014. Would continue with medical management.   2.  Dyspnea - echocardiogram and Myoview study have been unremarkable. It is suspected that her shortness of breath his more related to her COPD - she does remain on oxygen and using more regularly.   3. Paroxysmal atrial fibrillation. CHAD-VASC score of 4.On Eliquis 5 mg twice a day. From the standpoint of antiarrhythmic drug therapy her options are very limited. She has previously been intolerant of amiodarone. She is not a candidate for flecainide with her history of coronary disease. Her QTC was 479 ms with only an incomplete right bundle branch block so she would not be a candidate for Tikosyn. It is not felt that she would tolerate sotalol well at all. It may be, her only option is that of AVN ablation with PPM implant - she has not wanted to go that route. Regardless, she is currently doing well. No change with her current regimen. See her back in 3 months.  4. Hypertension. BP looks good on current regimen.   5. COPD - followed by Dr. Joya Gaskins.   Patient is agreeable to this plan and will call if any problems develop in the interim.   Burtis Junes, RN, [facility] [address] [place], [place]  [id number] [phone]

## 2013-04-21 DIAGNOSIS — H04129 Dry eye syndrome of unspecified lacrimal gland: Secondary | ICD-10-CM | POA: Diagnosis not present

## 2013-04-21 DIAGNOSIS — Z961 Presence of intraocular lens: Secondary | ICD-10-CM | POA: Diagnosis not present

## 2013-04-21 DIAGNOSIS — H01009 Unspecified blepharitis unspecified eye, unspecified eyelid: Secondary | ICD-10-CM | POA: Diagnosis not present

## 2013-04-21 DIAGNOSIS — H11159 Pinguecula, unspecified eye: Secondary | ICD-10-CM | POA: Diagnosis not present

## 2013-04-24 ENCOUNTER — Ambulatory Visit: Payer: Medicare Other | Admitting: Cardiology

## 2013-05-05 DIAGNOSIS — R82998 Other abnormal findings in urine: Secondary | ICD-10-CM | POA: Diagnosis not present

## 2013-05-12 ENCOUNTER — Telehealth: Payer: Self-pay | Admitting: Cardiology

## 2013-05-12 NOTE — Telephone Encounter (Addendum)
Returned call to patient she stated she has a cough.Stated she is coughing green yellow phlegm.Stated she wanted to know what to take for pain.Stated Dr.Jordan told her never take tylenol since she is on a blood thinner.Patient upset stated she has called 2 other times and I never returned her call.Stated feels like I don't care about her.Patient was told I do care about her and that I did call her back when she called.  Patient was told last 2 phone calls I received from her was 03/17/13 and 12/10/12 and I did return her calls.Patient stated she just feels bad and just wants to know what to take for pain.Spoke to Truitt Merle NP she advised ok to take tylenol.Advised do not take NSAIDS.Advised needs to call PCP about coughing green yellow phlegm,needs antibiotic.Patient stated she does not want to take antibiotic.Advised again she will need to call PCP regarding cough.

## 2013-05-12 NOTE — Telephone Encounter (Deleted)
Patient was told last 2 phone calls I received from her was 03/17/13 and 12/10/12 and I did return her calls.Patient stated she just feels bad and just wants to know what to take for pain.Spoke to Truitt Merle NP she advised ok to take tylenol.Advised do not take NSAIDS.Advised needs to call PCP about coughing green yellow phlegm,needs antibiotic.Patient stated she does not want to take antibiotic.Advised again she will need to call PCP regarding cough.

## 2013-05-12 NOTE — Telephone Encounter (Signed)
New message     What can pt take for URI----she cannot take tylenol or advil.  What can she take besides trimmadol?

## 2013-06-01 ENCOUNTER — Other Ambulatory Visit: Payer: Self-pay | Admitting: Cardiology

## 2013-06-09 ENCOUNTER — Encounter: Payer: Self-pay | Admitting: Cardiology

## 2013-06-09 DIAGNOSIS — E559 Vitamin D deficiency, unspecified: Secondary | ICD-10-CM | POA: Diagnosis not present

## 2013-06-09 DIAGNOSIS — R809 Proteinuria, unspecified: Secondary | ICD-10-CM | POA: Diagnosis not present

## 2013-06-09 DIAGNOSIS — M899 Disorder of bone, unspecified: Secondary | ICD-10-CM | POA: Diagnosis not present

## 2013-06-09 DIAGNOSIS — R609 Edema, unspecified: Secondary | ICD-10-CM | POA: Diagnosis not present

## 2013-06-09 DIAGNOSIS — E785 Hyperlipidemia, unspecified: Secondary | ICD-10-CM | POA: Diagnosis not present

## 2013-06-09 DIAGNOSIS — R82998 Other abnormal findings in urine: Secondary | ICD-10-CM | POA: Diagnosis not present

## 2013-06-09 DIAGNOSIS — E039 Hypothyroidism, unspecified: Secondary | ICD-10-CM | POA: Diagnosis not present

## 2013-06-09 DIAGNOSIS — I10 Essential (primary) hypertension: Secondary | ICD-10-CM | POA: Diagnosis not present

## 2013-06-09 DIAGNOSIS — E538 Deficiency of other specified B group vitamins: Secondary | ICD-10-CM | POA: Diagnosis not present

## 2013-06-10 DIAGNOSIS — Z961 Presence of intraocular lens: Secondary | ICD-10-CM | POA: Diagnosis not present

## 2013-06-10 DIAGNOSIS — H35379 Puckering of macula, unspecified eye: Secondary | ICD-10-CM | POA: Diagnosis not present

## 2013-06-10 DIAGNOSIS — H524 Presbyopia: Secondary | ICD-10-CM | POA: Diagnosis not present

## 2013-06-10 DIAGNOSIS — H35039 Hypertensive retinopathy, unspecified eye: Secondary | ICD-10-CM | POA: Diagnosis not present

## 2013-06-11 DIAGNOSIS — H43819 Vitreous degeneration, unspecified eye: Secondary | ICD-10-CM | POA: Diagnosis not present

## 2013-06-11 DIAGNOSIS — H35379 Puckering of macula, unspecified eye: Secondary | ICD-10-CM | POA: Diagnosis not present

## 2013-06-11 DIAGNOSIS — H11009 Unspecified pterygium of unspecified eye: Secondary | ICD-10-CM | POA: Diagnosis not present

## 2013-06-16 DIAGNOSIS — I2510 Atherosclerotic heart disease of native coronary artery without angina pectoris: Secondary | ICD-10-CM | POA: Diagnosis not present

## 2013-06-16 DIAGNOSIS — I10 Essential (primary) hypertension: Secondary | ICD-10-CM | POA: Diagnosis not present

## 2013-06-16 DIAGNOSIS — J449 Chronic obstructive pulmonary disease, unspecified: Secondary | ICD-10-CM | POA: Diagnosis not present

## 2013-06-16 DIAGNOSIS — I4891 Unspecified atrial fibrillation: Secondary | ICD-10-CM | POA: Diagnosis not present

## 2013-06-16 DIAGNOSIS — R7301 Impaired fasting glucose: Secondary | ICD-10-CM | POA: Diagnosis not present

## 2013-06-16 DIAGNOSIS — E785 Hyperlipidemia, unspecified: Secondary | ICD-10-CM | POA: Diagnosis not present

## 2013-06-16 DIAGNOSIS — E538 Deficiency of other specified B group vitamins: Secondary | ICD-10-CM | POA: Diagnosis not present

## 2013-06-16 DIAGNOSIS — I252 Old myocardial infarction: Secondary | ICD-10-CM | POA: Diagnosis not present

## 2013-06-17 ENCOUNTER — Telehealth: Payer: Self-pay | Admitting: Cardiology

## 2013-06-17 NOTE — Telephone Encounter (Signed)
New problem   Pt is having afib and need to speak to nurse concerning this, pt want to be seen tomorrow.

## 2013-06-17 NOTE — Telephone Encounter (Signed)
S/w pt agreeable to plan will increase Diltiazem to bid and will come in on 5/19 for appointment with Cecille Rubin

## 2013-06-17 NOTE — Telephone Encounter (Signed)
Unfortunately, her options for her atrial fib are quite limited.  She may increase her Diltiazem to BID  See me next week - her visit for the 18th needed to be changed anyway due to "LEAN" project that I am serving on.  Ok to use one of the held slots.

## 2013-06-17 NOTE — Telephone Encounter (Signed)
States the AFib has "stopped" and heart is back in rhythm now. States she it lasted 20 minutes at HR of 140 and this has been going on the last few days, usually for 20 minutes each time. Renee [name] verbalized that during the episode she is symptomatic with SOB, dizziness and fatigue. She states that she is "scared and feels like her body is telling her that something is wrong". She wants to be seen today or tomorrow but there are no open appts at this time. Patient requesting a call back from Truitt Merle, NP. Advised patient that the normal protocol is for patients to go to ED for active AFib episodes. Patient verbalizes understanding of this and agrees that should she experience it again "maybe I should go on there". Patient confirms that she is taking her medication appropriately and especially her Eliquis. Patient is having multiple stressors going on currently in her life. We discussed AFib and triggers, medications and the possibility of medication changes if MD assesses a need for changes, and need for patient to go to ED during next episode so that she can be appropriately assessed and treated. Patient verbalized understanding. She will await return phone call from Truitt Merle, NP, and call office back for any further questions/concerns.

## 2013-06-23 ENCOUNTER — Encounter: Payer: Self-pay | Admitting: Nurse Practitioner

## 2013-06-23 ENCOUNTER — Ambulatory Visit (INDEPENDENT_AMBULATORY_CARE_PROVIDER_SITE_OTHER): Payer: Medicare Other | Admitting: Nurse Practitioner

## 2013-06-23 VITALS — BP 120/70 | HR 72 | Ht 66.0 in | Wt 155.8 lb

## 2013-06-23 DIAGNOSIS — I2510 Atherosclerotic heart disease of native coronary artery without angina pectoris: Secondary | ICD-10-CM

## 2013-06-23 DIAGNOSIS — R0989 Other specified symptoms and signs involving the circulatory and respiratory systems: Secondary | ICD-10-CM

## 2013-06-23 DIAGNOSIS — R0609 Other forms of dyspnea: Secondary | ICD-10-CM | POA: Diagnosis not present

## 2013-06-23 DIAGNOSIS — R0600 Dyspnea, unspecified: Secondary | ICD-10-CM

## 2013-06-23 DIAGNOSIS — I4891 Unspecified atrial fibrillation: Secondary | ICD-10-CM | POA: Diagnosis not present

## 2013-06-23 DIAGNOSIS — I480 Paroxysmal atrial fibrillation: Secondary | ICD-10-CM

## 2013-06-23 NOTE — Progress Notes (Signed)
[name] Date of Birth: 11/01/1934 Medical Record #[id number]  History of Present Illness: Renee [name] is seen back today for a work in visit. Seen for Dr. Martinique. She has paroxysmal atrial fibrillation, known CAD with past CABG per EBG in August of 2011. Other issues include chronic diastolic heart failure, COPD, sinusitis, GERD, RBBB, anxiety, HTN, HLD and hypothyroidism.   She had a Myoview study in April 2014 which was normal. An echocardiogram at that time demonstrated normal LV function. She had mild mitral insufficiency without evidence of prolapse.   In October of 2014 she wore an event monitor. This demonstrated episodes of rapid Afib with rates up to 190 bpm. diltiazem 240 mg daily was added to metoprolol.   I saw her back in February after she was noted to be having more AF with RVR - Dr. Martinique had suggested increasing her diltiazem - she preferred to use prn. She was doing ok with this approach at her visit with me in March.   Called last week - having more AF and was symptomatic - I increased the diltiazem to BID. We have very limited options in regards to keeping her in sinus rhythm. Her only real option is felt to be AV nodal ablation with PPM placement - she has previously not wanted to go that route.   Comes back today. Here with her husband. She is frustrated. She remains limited by her breathing. She feels like the metoprolol affects her breathing. The increase in Diltiazem has helped. She has some increase in her swelling over the past few days. She is probably getting too much salt. No chest pain. Her sternum still quite sensitive to touch.   Current Outpatient Prescriptions  Medication Sig Dispense Refill  . Aclidinium Bromide (TUDORZA PRESSAIR) 400 MCG/ACT AEPB Inhale 1 puff into the lungs 2 (two) times daily.  1 each  0  . ALPRAZolam (XANAX) 0.25 MG tablet Take 1 tablet by mouth as needed.      [name] apixaban (ELIQUIS) 5 MG TABS tablet Take 1 tablet (5 mg total) by mouth  2 (two) times daily.  60 tablet  11  . Cyanocobalamin (VITAMIN B 12) 100 MCG LOZG Inject into the skin. ON HOLD      . diltiazem (CARDIZEM CD) 240 MG 24 hr capsule May take an extra 240 mg daily if HR greater than 110  60 capsule  6  . esomeprazole (NEXIUM) 40 MG capsule Take 40 mg by mouth daily before breakfast.        . furosemide (LASIX) 40 MG tablet TAKE 1 TABLET BY MOUTH TWICE DAILY  60 tablet  5  . levalbuterol (XOPENEX HFA) 45 MCG/ACT inhaler Inhale 2 puffs into the lungs every 6 (six) hours as needed for wheezing.      [name] levothyroxine (SYNTHROID, LEVOTHROID) 25 MCG tablet Take 25 mcg by mouth daily before breakfast.       . metoprolol tartrate (LOPRESSOR) 25 MG tablet TAKE 1 TABLET BY MOUTH TWICE DAILY  30 tablet  0  . nitroGLYCERIN (NITROSTAT) 0.4 MG SL tablet PLACE 1 TABLET UNDER THE TONGUE EVERY 5 MINUTES AS NEEDED FOR CHEST PAIN  25 tablet  3  . NON FORMULARY as needed. Oxygen - 3L pulse with extertion, 2L continuious QHS.      [name] Glycol-Propyl Glycol (SYSTANE OP) Apply 1 drop to eye daily.        . potassium chloride SA (K-DUR,KLOR-CON) 20 MEQ tablet Take 1 tablet (20 mEq total) by  mouth 2 (two) times daily.  60 tablet  6  . traMADol (ULTRAM) 50 MG tablet Take 1 tablet (50 mg total) by mouth every 6 (six) hours as needed. Pain  30 tablet  0  . VITAMIN D, CHOLECALCIFEROL, PO Take 1 tablet by mouth daily.        . [DISCONTINUED] budesonide-formoterol (SYMBICORT) 160-4.5 MCG/ACT inhaler Inhale 2 puffs into the lungs 2 (two) times daily.  1 Inhaler  12  . [DISCONTINUED] calcium citrate-vitamin D (CITRACAL+D) 315-200 MG-UNIT per tablet Take 1 tablet by mouth 2 (two) times daily.         No current facility-administered medications for this visit.    Allergies  Allergen Reactions  . Spiriva [Tiotropium Bromide Monohydrate] Other (See Comments)    Throat irritation  . Statins     Legs hurt  . Levofloxacin Nausea Only    Unable to tolerate more than 7days  .  Budesonide-Formoterol Fumarate     Tachycardia  . Morphine     REACTION: double vision  . Penicillins     REACTION: swelling/rash  . Phenazopyridine Hcl     *piridium* REACTION: face red  . Pneumococcal Vaccine     REACTION: rash and cough  . Pneumococcal Vaccines Swelling    Redding of the skin  . Prednisone     REACTION: rash  . Prednisone Swelling  . Sulfonamide Derivatives     REACTION: rash  . Tylenol [Acetaminophen]     Tylenol #3 -- rash and swelling  . Penicillins Swelling and Rash  . Sulfa Antibiotics Rash    Past Medical History  Diagnosis Date  . GERD (gastroesophageal reflux disease)   . IBS (irritable bowel syndrome)   . Bundle branch block, right   . Coronary artery disease     prior CABG in August of 2011  . Emphysema/COPD   . Fatigue   . Joint pain   . Anxiety   . AF (atrial fibrillation)     Post op after CABG; off amiodarone  . HTN (hypertension)   . CHF (congestive heart failure)   . Dysphagia   . Colon polyps   . Hypertension   . Hypercholesteremia   . Normal nuclear stress test October 2012  . Myocardial infarction   . Hypothyroidism   . Palpitations     Past Surgical History  Procedure Laterality Date  . Coronary artery bypass graft    . Total hip arthroplasty  11/01/2008    right  . Appendectomy  1995  . Cholecystectomy  2008    LARYNX  . Tonsillectomy and adenoidectomy    . Total abdominal hysterectomy  1986  . Cardiac surgery    . Joint replacement      Right Hip 2010    History  Smoking status  . Former Smoker -- 1.00 packs/day for 50 years  . Types: Cigarettes  . Quit date: 09/21/2009  Smokeless tobacco  . Never Used    History  Alcohol Use No    Family History  Problem Relation Age of Onset  . Heart disease Mother   . Transient ischemic attack Mother   . Hypertension Mother   . Deep vein thrombosis Mother   . Heart attack Father   . Hypertension Father   . Diabetes Father   . Heart disease Father      Heart disease before age 76  . Hyperlipidemia Father   . Breast cancer Sister   . Cancer Sister   . Other Sister  brain tumor  . Colon polyps Brother   . Hypertension Brother   . Colon cancer Daughter     Review of Systems: The review of systems is per the HPI.  All other systems were reviewed and are negative.  Physical Exam: BP 120/70  Pulse 72  Ht 5\' 6"  (1.676 m)  Wt 155 lb 12.8 oz (70.67 kg)  BMI 25.16 kg/m2  SpO2 98% Patient is alert and in no acute distress. Her weight is only up a pound. Skin is warm and dry. Color is normal.  HEENT is unremarkable. Normocephalic/atraumatic. PERRL. Sclera are nonicteric. Neck is supple. No masses. No JVD. Lungs are clear. Cardiac exam shows a regular rate and rhythm today. Abdomen is soft. Extremities are without edema. Gait and ROM are intact. No gross neurologic deficits noted.  Wt Readings from Last 3 Encounters:  06/23/13 155 lb 12.8 oz (70.67 kg)  04/20/13 154 lb 12.8 oz (70.217 kg)  03/18/13 153 lb 12.8 oz (69.763 kg)     LABORATORY DATA: Lab Results  Component Value Date   WBC 9.8 03/17/2011   HGB 13.4 03/17/2011   HCT 43.3 03/17/2011   PLT 217 09/25/2009   GLUCOSE 86 12/26/2010   CHOL  Value: 207        ATP III CLASSIFICATION:  <200     mg/dL   Desirable  200-239  mg/dL   Borderline High  >=240    mg/dL   High* 07/16/2007   TRIG 116 07/16/2007   HDL 45 07/16/2007   LDLCALC  Value: 139        Total Cholesterol/HDL:CHD Risk Coronary Heart Disease Risk Table                     Men   Women  1/2 Average Risk   3.4   3.3* 07/16/2007   ALT 15 09/21/2009   AST 23 09/21/2009   NA 140 12/26/2010   K 4.0 12/26/2010   CL 100 12/26/2010   CREATININE 1.1 12/26/2010   BUN 14 12/26/2010   CO2 29 12/26/2010   TSH 0.756 09/22/2009   INR 1.22 09/21/2009   HGBA1C  Value: 5.7 (NOTE)                                                                       According to the ADA Clinical Practice Recommendations for 2011, when HbA1c is used as a screening  test:   >=6.5%   Diagnostic of Diabetes Mellitus           (if abnormal result  is confirmed)  5.7-6.4%   Increased risk of developing Diabetes Mellitus  References:Diagnosis and Classification of Diabetes Mellitus,Diabetes ZWCH,8527,78(EUMPN 1):S62-S69 and Standards of Medical Care in         Diabetes - 2011,Diabetes Care,2011,34  (Suppl 1):S11-S61.* 09/19/2009     Assessment / Plan: 1. CAD - with past CABG - no ischemia noted on Myoview study from 2014. Would continue with medical management. She currently has no symptoms.   2. Dyspnea - echocardiogram and Myoview study have been unremarkable. It is suspected that her shortness of breath his more related to her COPD - she does remain on oxygen and using more regularly.   3. Paroxysmal  atrial fibrillation. CHAD-VASC score of 4.On Eliquis 5 mg twice a day. From the standpoint of antiarrhythmic drug therapy her options remain very limited. She has previously been intolerant of amiodarone. She is not a candidate for flecainide with her history of coronary disease. Her QTC was 479 ms with only an incomplete right bundle branch block so she would not be a candidate for Tikosyn. It is not felt that she would tolerate sotalol well at all. It may be, her only option is that of AVN ablation with PPM implant - she has not wanted to go that route but is now willing to consider. Will get her to see Dr. Caryl Comes. Her schedule is quite busy until about mid July - will try to arrange for after that time.   4. Hypertension. BP looks good on current regimen.   5. COPD - followed by Dr. Joya Gaskins.   Patient is agreeable to this plan and will call if any problems develop in the interim.   Burtis Junes, RN, [facility]  [address] [place]  [place], [place] [address]  [phone]

## 2013-06-23 NOTE — Patient Instructions (Addendum)
Stay on your current medicines for now  Really restrict your salt  I will get you a visit with Dr. Caryl Comes for after mid July - to talk about possible AV nodal ablation with pacemaker implant  See me in 4 months  Call the [place] office at [phone] if you have any questions, problems or concerns.

## 2013-06-30 ENCOUNTER — Encounter: Payer: Self-pay | Admitting: Internal Medicine

## 2013-06-30 ENCOUNTER — Ambulatory Visit (INDEPENDENT_AMBULATORY_CARE_PROVIDER_SITE_OTHER): Payer: Medicare Other | Admitting: Internal Medicine

## 2013-06-30 VITALS — BP 102/72 | HR 89 | Ht 66.0 in | Wt 155.0 lb

## 2013-06-30 DIAGNOSIS — J441 Chronic obstructive pulmonary disease with (acute) exacerbation: Secondary | ICD-10-CM

## 2013-06-30 DIAGNOSIS — I2510 Atherosclerotic heart disease of native coronary artery without angina pectoris: Secondary | ICD-10-CM | POA: Diagnosis not present

## 2013-06-30 MED ORDER — METHYLPREDNISOLONE ACETATE 80 MG/ML IJ SUSP
80.0000 mg | Freq: Once | INTRAMUSCULAR | Status: AC
  Administered 2013-06-30: 80 mg via INTRAMUSCULAR

## 2013-06-30 MED ORDER — CEFDINIR 300 MG PO CAPS: ORAL_CAPSULE | ORAL | Status: DC

## 2013-06-30 NOTE — Patient Instructions (Addendum)
You are in [facility] Take IM depot medrol 80mg x 1 now in office Cefdinir 30omg twice daily  X 5 days (same as last time); to my knowledge no interactions with eliquis Continue tudorza and symbicort scheduled like before xopenex as needed Continue o2 prn day time + scheduled at bed time  Followup Return 2 months to see Dr Wright Any bleeding issues; call or report to ER immediately 

## 2013-06-30 NOTE — Progress Notes (Signed)
Subjective:    Patient ID: Renee [name], female    DOB: 05-04-1934, 79 y.o.   MRN: [id number] PCP [name], MD Pulmonary: Dr Asencion Noble  HPI  IOV 06/30/2013  Chief Complaint  Patient presents with  . Acute Visit    Pt c/o increase in SOB with less activity. C/o productive cough starting from white mucous and now turning green x 2 days. C/o chest pain with and without activity.       COPD - March 2-014: Fev1 1.4L/64%, RAtio 73. COPD -quit smoking 09/21/09 -Gold C . Also has CAD with diast dysfn. She reports mania  to prednisone but apparently can have IM steroid shots in office (last recorded dec 2014 in office).  She has hx of recurrent AECOPD'; last abx Rx with cefdinir in feb 2015.Past 2 days reports insidious onset of scratchy throat, increased cough, increased sputum volume and change in sputum from white to green. There is associated chest tightness, dyspnea that is worse . OPVerall severity is mild to moderate worsening. No fever.    Meds: She has numerous allergies including PCN but in feb 2015 she took cefdinir without a problem. SHe is asking for interactions of cefdinir with elqiuis. I am not sure about this but to my knowledge there is no CYP interactions  Social: daughter is having cancer surgery first week June 2015        reports that she quit smoking about 3 years ago. Her smoking use included Cigarettes. She has a 50 pack-year smoking history. She has never used smokeless tobacco.     has a past medical history of GERD (gastroesophageal reflux disease); IBS (irritable bowel syndrome); Bundle branch block, right; Coronary artery disease; Emphysema/COPD; Fatigue; Joint pain; Anxiety; AF (atrial fibrillation); HTN (hypertension); CHF (congestive heart failure); Dysphagia; Colon polyps; Hypertension; Hypercholesteremia; Normal nuclear stress test (October 2012); Myocardial infarction; Hypothyroidism; and Palpitations.   has past surgical history that  includes Coronary artery bypass graft; Total hip arthroplasty (11/01/2008); Appendectomy (1995); Cholecystectomy (2008); Tonsillectomy and adenoidectomy; Total abdominal hysterectomy (1986); Cardiac surgery; and Joint replacement.  Current outpatient prescriptions:Aclidinium Bromide (TUDORZA PRESSAIR) 400 MCG/ACT AEPB, Inhale 1 puff into the lungs 2 (two) times daily., Disp: 1 each, Rfl: 0;  ALPRAZolam (XANAX) 0.25 MG tablet, Take 1 tablet by mouth as needed., Disp: , Rfl: ;  apixaban (ELIQUIS) 5 MG TABS tablet, Take 1 tablet (5 mg total) by mouth 2 (two) times daily., Disp: 60 tablet, Rfl: 11 Cyanocobalamin (VITAMIN B 12) 100 MCG LOZG, Inject into the skin. ON HOLD, Disp: , Rfl: ;  diltiazem (CARDIZEM CD) 240 MG 24 hr capsule, Take 240 mg by mouth 2 (two) times daily., Disp: , Rfl: ;  esomeprazole (NEXIUM) 40 MG capsule, Take 40 mg by mouth daily before breakfast.  , Disp: , Rfl: ;  furosemide (LASIX) 40 MG tablet, TAKE 1 TABLET BY MOUTH TWICE DAILY, Disp: 60 tablet, Rfl: 5 levalbuterol (XOPENEX HFA) 45 MCG/ACT inhaler, Inhale 2 puffs into the lungs every 6 (six) hours as needed for wheezing., Disp: , Rfl: ;  levothyroxine (SYNTHROID, LEVOTHROID) 25 MCG tablet, Take 25 mcg by mouth daily before breakfast. , Disp: , Rfl: ;  metoprolol tartrate (LOPRESSOR) 25 MG tablet, TAKE 1 TABLET BY MOUTH TWICE DAILY, Disp: 30 tablet, Rfl: 0 nitroGLYCERIN (NITROSTAT) 0.4 MG SL tablet, PLACE 1 TABLET UNDER THE TONGUE EVERY 5 MINUTES AS NEEDED FOR CHEST PAIN, Disp: 25 tablet, Rfl: 3;  NON FORMULARY, as needed. Oxygen - 3L pulse with extertion,  2L continuious QHS., Disp: , Rfl: ;  Polyethyl Glycol-Propyl Glycol (SYSTANE OP), Apply 1 drop to eye daily.  , Disp: , Rfl:  potassium chloride SA (K-DUR,KLOR-CON) 20 MEQ tablet, Take 1 tablet (20 mEq total) by mouth 2 (two) times daily., Disp: 60 tablet, Rfl: 6;  traMADol (ULTRAM) 50 MG tablet, Take 1 tablet (50 mg total) by mouth every 6 (six) hours as needed. Pain, Disp: 30 tablet,  Rfl: 0;  VITAMIN D, CHOLECALCIFEROL, PO, Take 1 tablet by mouth daily.  , Disp: , Rfl:  [DISCONTINUED] budesonide-formoterol (SYMBICORT) 160-4.5 MCG/ACT inhaler, Inhale 2 puffs into the lungs 2 (two) times daily., Disp: 1 Inhaler, Rfl: 12;  [DISCONTINUED] calcium citrate-vitamin D (CITRACAL+D) 315-200 MG-UNIT per tablet, Take 1 tablet by mouth 2 (two) times daily.  , Disp: , Rfl:         Review of Systems  Constitutional: Negative for fever and unexpected weight change.  HENT: Positive for congestion, rhinorrhea, sore throat and trouble swallowing. Negative for dental problem, ear pain, nosebleeds, postnasal drip, sinus pressure and sneezing.   Eyes: Negative for redness and itching.  Respiratory: Positive for cough and shortness of breath. Negative for chest tightness and wheezing.   Cardiovascular: Positive for chest pain. Negative for palpitations and leg swelling.  Gastrointestinal: Positive for nausea. Negative for vomiting.  Genitourinary: Negative for dysuria.  Musculoskeletal: Negative for joint swelling.  Skin: Negative for rash.  Neurological: Negative for headaches.  Hematological: Does not bruise/bleed easily.  Psychiatric/Behavioral: Negative for dysphoric mood. The patient is not nervous/anxious.        Objective:   Physical Exam  Vitals reviewed. Constitutional: She is oriented to person, place, and time. She appears well-developed and well-nourished. No distress.  Body mass index is 25.03 kg/(m^2).   HENT:  Head: Normocephalic and atraumatic.  Right Ear: External ear normal.  Left Ear: External ear normal.  Mouth/Throat: Oropharynx is clear and moist. No oropharyngeal exudate.  Eyes: Conjunctivae and EOM are normal. Pupils are equal, round, and reactive to light. Right eye exhibits no discharge. Left eye exhibits no discharge. No scleral icterus.  Neck: Normal range of motion. Neck supple. No JVD present. No tracheal deviation present. No thyromegaly present.    Cardiovascular: Normal rate, regular rhythm, normal heart sounds and intact distal pulses.  Exam reveals no gallop and no friction rub.   No murmur heard. Pulmonary/Chest: Effort normal. No respiratory distress. She has wheezes. She has no rales. She exhibits no tenderness.  occ wheeze+ barrell chest +  Abdominal: Soft. Bowel sounds are normal. She exhibits no distension and no mass. There is no tenderness. There is no rebound and no guarding.  Musculoskeletal: Normal range of motion. She exhibits no edema and no tenderness.  Lymphadenopathy:    She has no cervical adenopathy.  Neurological: She is alert and oriented to person, place, and time. She has normal reflexes. No cranial nerve deficit. She exhibits normal muscle tone. Coordination normal.  Skin: Skin is warm and dry. No rash noted. She is not diaphoretic. No erythema. No pallor.  Psychiatric: She has a normal mood and affect. Her behavior is normal. Judgment and thought content normal.     Filed Vitals:   06/30/13 1517  Height: 5\' 6"  (1.676 m)  Weight: 155 lb (70.308 kg)        Assessment & Plan:  You are in AECOPD Take IM depot medrol 80mg  x 1 now in office Cefdinir 30omg twice daily  X 5 days (same as last time);  to my knowledge no interactions with eliquis Continue tudorza and symbicort scheduled like before xopenex as needed Continue o2 prn day time + scheduled at bed time  Followup Return 2 months to see Dr Joya Gaskins Any bleeding issues; call or report to ER immediately

## 2013-06-30 NOTE — Assessment & Plan Note (Signed)
You are in AECOPD Take IM depot medrol 80mg  x 1 now in office Cefdinir 30omg twice daily  X 5 days (same as last time); to my knowledge no interactions with eliquis Continue tudorza and symbicort scheduled like before xopenex as needed Continue o2 prn day time + scheduled at bed time  Followup Return 2 months to see Dr Joya Gaskins Any bleeding issues; call or report to ER immediately

## 2013-07-08 DIAGNOSIS — J029 Acute pharyngitis, unspecified: Secondary | ICD-10-CM | POA: Diagnosis not present

## 2013-07-08 DIAGNOSIS — B9789 Other viral agents as the cause of diseases classified elsewhere: Secondary | ICD-10-CM | POA: Diagnosis not present

## 2013-07-17 ENCOUNTER — Telehealth: Payer: Self-pay | Admitting: Cardiology

## 2013-07-17 NOTE — Telephone Encounter (Signed)
Spoke to [place] with Dr.Rankin's office.Dr.Jordan cleared patient for retinal surgery and form was faxed on 07/09/13.Form refaxed today 07/17/13.

## 2013-07-17 NOTE — Telephone Encounter (Signed)
New problem   Need to know status of clearance that was faxed over on 07/09/13. Please call.

## 2013-07-21 ENCOUNTER — Ambulatory Visit: Payer: Medicare Other | Admitting: Nurse Practitioner

## 2013-07-24 ENCOUNTER — Ambulatory Visit: Payer: Medicare Other | Admitting: Critical Care Medicine

## 2013-08-12 DIAGNOSIS — B029 Zoster without complications: Secondary | ICD-10-CM | POA: Diagnosis not present

## 2013-08-17 DIAGNOSIS — Z6825 Body mass index (BMI) 25.0-25.9, adult: Secondary | ICD-10-CM | POA: Diagnosis not present

## 2013-08-17 DIAGNOSIS — I10 Essential (primary) hypertension: Secondary | ICD-10-CM | POA: Diagnosis not present

## 2013-08-17 DIAGNOSIS — E039 Hypothyroidism, unspecified: Secondary | ICD-10-CM | POA: Diagnosis not present

## 2013-08-17 DIAGNOSIS — I4891 Unspecified atrial fibrillation: Secondary | ICD-10-CM | POA: Diagnosis not present

## 2013-08-17 DIAGNOSIS — B029 Zoster without complications: Secondary | ICD-10-CM | POA: Diagnosis not present

## 2013-08-20 ENCOUNTER — Encounter: Payer: Self-pay | Admitting: Internal Medicine

## 2013-08-26 ENCOUNTER — Encounter: Payer: Self-pay | Admitting: Internal Medicine

## 2013-08-26 ENCOUNTER — Ambulatory Visit (INDEPENDENT_AMBULATORY_CARE_PROVIDER_SITE_OTHER): Payer: Medicare Other | Admitting: Internal Medicine

## 2013-08-26 VITALS — BP 115/67 | HR 61 | Ht 66.0 in | Wt 157.0 lb

## 2013-08-26 DIAGNOSIS — I480 Paroxysmal atrial fibrillation: Secondary | ICD-10-CM

## 2013-08-26 DIAGNOSIS — I2510 Atherosclerotic heart disease of native coronary artery without angina pectoris: Secondary | ICD-10-CM

## 2013-08-26 DIAGNOSIS — I4891 Unspecified atrial fibrillation: Secondary | ICD-10-CM | POA: Diagnosis not present

## 2013-08-26 NOTE — Progress Notes (Signed)
ELECTROPHYSIOLOGY CONSULT NOTE  Patient ID: Renee [name], MRN: [id number], DOB/AGE: March 13, 1934 79 y.o. Admit date: (Not on file) Date of Consult: 08/26/2013  Primary Physician: [name], MD Primary Cardiologist: PJ Chief Complaint: atrial fibrillaton    HPI Renee [name] is a 79 y.o. female referred for consideration of treatment options for atrial fibrillation.  She has a history of atrial fibrillation that dates back a number of years possibly to around the time of her bypass surgery in 2011. Is relatively infrequent occurring maybe 5 or 10 times a year but is quite symptomatic with shortness of breath lightheadedness chest discomfort and a sensation of impending doom.  She also wonders whether we'll stop and this creates a great deal of anxiety.  Recording demonstrated episodes of very rapid atrial fibrillation with rates up to 190s. Cardizem was added to metoprolol; up titration was complicated by significant edema.  At some point amiodarone was initiated. This resulted in significant shortness of breath, I suspect manifestation of pulmonary toxicity as well as hypothyroidism prompting thyroid replacement.  There was a discussion earlier as to whether she could be placed on dofetilide, at that point however, QTC was borderline long incontinence of right bundle branch block. 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 She has had hypokalemia  Past Medical History  Diagnosis Date  . GERD (gastroesophageal reflux disease)   . IBS (irritable bowel syndrome)   . Bundle branch block, right   . Coronary artery disease     prior CABG in August of 2011  . Emphysema/COPD   . Fatigue   . Joint pain   . Anxiety   . AF (atrial fibrillation)     Post op after CABG; off amiodarone  . HTN  (hypertension)   . CHF (congestive heart failure)   . Dysphagia   . Colon polyps   . Hypertension   . Hypercholesteremia   . Normal nuclear stress test October 2012  . Myocardial infarction   . Hypothyroidism   . Palpitations       Surgical History:  Past Surgical History  Procedure Laterality Date  . Coronary artery bypass graft    . Total hip arthroplasty  11/01/2008    right  . Appendectomy  1995  . Cholecystectomy  2008    LARYNX  . Tonsillectomy and adenoidectomy    . Total abdominal hysterectomy  1986  . Cardiac surgery    . Joint replacement      Right Hip 2010     Home Meds: Prior to Admission medications   Medication Sig Start Date End Date Taking? Authorizing Provider  Aclidinium Bromide (TUDORZA PRESSAIR) 400 MCG/ACT AEPB Inhale 1 puff into the lungs 2 (two) times daily. 03/17/13  Yes Elsie Stain, MD  ALPRAZolam Duanne Moron) 0.25 MG tablet Take 1 tablet by mouth as needed.   Yes Historical Provider, MD  apixaban (ELIQUIS) 5 MG TABS tablet Take 1 tablet (5 mg total) by mouth 2 (two) times daily. 12/04/12  Yes Peter M Martinique, MD  Cyanocobalamin (VITAMIN B 12) 100 MCG LOZG Inject into the skin. ON HOLD   Yes Historical Provider, MD  diltiazem (CARDIZEM CD) 240 MG 24 hr capsule Take 240 mg by mouth daily.  03/18/13  Yes Burtis Junes, NP  esomeprazole (NEXIUM) 40 MG capsule Take 40 mg by mouth  daily before breakfast.     Yes Historical Provider, MD  furosemide (LASIX) 40 MG tablet TAKE 1 TABLET BY MOUTH TWICE DAILY 06/26/10  Yes Peter M Martinique, MD  levalbuterol [facility] HFA) 45 MCG/ACT inhaler Inhale 2 puffs into the lungs every 6 (six) hours as needed for wheezing.   Yes Historical Provider, MD  levothyroxine (SYNTHROID, LEVOTHROID) 25 MCG tablet Take 25 mcg by mouth daily before breakfast.  03/02/13  Yes Historical Provider, MD  metoprolol tartrate (LOPRESSOR) 25 MG tablet TAKE 1 TABLET BY MOUTH TWICE DAILY 06/01/13  Yes Peter M Martinique, MD  nitroGLYCERIN (NITROSTAT) 0.4  MG SL tablet PLACE 1 TABLET UNDER THE TONGUE EVERY 5 MINUTES AS NEEDED FOR CHEST PAIN 10/17/12  Yes Peter M Martinique, MD  NON FORMULARY as needed. Oxygen - 3L pulse with extertion, 2L continuious QHS.   Yes Historical Provider, MD  Polyethyl Glycol-Propyl Glycol (SYSTANE OP) Apply 1 drop to eye daily.     Yes Historical Provider, MD  potassium chloride SA (K-DUR,KLOR-CON) 20 MEQ tablet Take 1 tablet (20 mEq total) by mouth 2 (two) times daily. 09/05/12  Yes Peter M Martinique, MD  traMADol (ULTRAM) 50 MG tablet Take 1 tablet (50 mg total) by mouth every 6 (six) hours as needed. Pain 03/17/11  Yes Posey Boyer, MD  VITAMIN D, CHOLECALCIFEROL, PO Take 1 tablet by mouth daily.     Yes Historical Provider, MD     Allergies:  Allergies  Allergen Reactions  . Spiriva [Tiotropium Bromide Monohydrate] Other (See Comments)    Throat irritation  . Statins     Legs hurt  . Levofloxacin Nausea Only    Unable to tolerate more than 7days  . Budesonide-Formoterol Fumarate     Tachycardia  . Morphine     REACTION: double vision  . Penicillins     REACTION: swelling/rash  . Phenazopyridine Hcl     *piridium* REACTION: face red  . Pneumococcal Vaccine     REACTION: rash and cough  . Pneumococcal Vaccines Swelling    Redding of the skin  . Prednisone     REACTION: rash  . Prednisone Swelling  . Sulfonamide Derivatives     REACTION: rash  . Tylenol [Acetaminophen]     Tylenol #3 -- rash and swelling  . Penicillins Swelling and Rash  . Sulfa Antibiotics Rash    History   Social History  . Marital Status: Married    Spouse Name: N/A    Number of Children: 1  . Years of Education: N/A   Occupational History  . nurse     retired   Social History Main Topics  . Smoking status: Former Smoker -- 1.00 packs/day for 50 years    Types: Cigarettes    Quit date: 09/21/2009  . Smokeless tobacco: Never Used  . Alcohol Use: No  . Drug Use: No  . Sexual Activity: Not on file   Other Topics Concern    . Not on file   Social History Narrative   ** Merged History Encounter **       Daily caffeine use: Dr Malachi Bonds and 2 cups coffee per day     Family History  Problem Relation Age of Onset  . Heart disease Mother   . Transient ischemic attack Mother   . Hypertension Mother   . Deep vein thrombosis Mother   . Heart attack Father   . Hypertension Father   . Diabetes Father   . Heart disease Father  Heart disease before age 21  . Hyperlipidemia Father   . Breast cancer Sister   . Cancer Sister   . Other Sister     brain tumor  . Colon polyps Brother   . Hypertension Brother   . Colon cancer Daughter      ROS:  Please see the history of present illness.     All other systems reviewed and negative.    Physical Exam: Blood pressure 115/67, pulse 61, height 5\' 6"  (1.676 m), weight 157 lb (71.215 kg). General: Well developed, well nourished female in no acute distress. Head: Normocephalic, atraumatic, sclera non-icteric, no xanthomas, nares are without discharge. EENT: normal Lymph Nodes:  none Back: without scoliosis/kyphosis, no CVA tendersness Neck: Negative for carotid bruits. JVD not elevated. Lungs: Clear bilaterally to auscultation without wheezes, rales, or rhonchi. Breathing is unlabored. Heart: RRR with S1 S2.widely split  2/6 sys murmur , rubs, or gallops appreciated. Abdomen: Soft, non-tender, non-distended with normoactive bowel sounds. No hepatomegaly. No rebound/guarding. No obvious abdominal masses. Msk:  Strength and tone appear normal for age. Extremities: No clubbing or cyanosis. No   edema.  Distal pedal pulses are 2+ and equal bilaterally. Skin: Warm and Dry Neuro: Alert and oriented X 3. CN III-XII intact Grossly normal sensory and motor function . Psych:  Responds to questions appropriately with a normal affect.      Labs: Cardiac Enzymes No results found for this basename: CKTOTAL, CKMB, TROPONINI,  in the last 72 hours CBC Lab Results   Component Value Date   WBC 9.8 03/17/2011   HGB 13.4 03/17/2011   HCT 43.3 03/17/2011   MCV 89.3 03/17/2011   PLT 217 09/25/2009   PROTIME: No results found for this basename: LABPROT, INR,  in the last 72 hours Chemistry No results found for this basename: NA, K, CL, CO2, BUN, CREATININE, CALCIUM, LABALBU, PROT, BILITOT, ALKPHOS, ALT, AST, GLUCOSE,  in the last 168 hours Lipids Lab Results  Component Value Date   CHOL  Value: 207        ATP III CLASSIFICATION:  <200     mg/dL   Desirable  200-239  mg/dL   Borderline High  >=240    mg/dL   High* 07/16/2007   HDL 45 07/16/2007   LDLCALC  Value: 139        Total Cholesterol/HDL:CHD Risk Coronary Heart Disease Risk Table                     Men   Women  1/2 Average Risk   3.4   3.3* 07/16/2007   TRIG 116 07/16/2007   BNP Pro B Natriuretic peptide (BNP)  Date/Time Value Ref Range Status  12/24/2010  3:50 PM 5.0  0 - 125 pg/mL Final  11/28/2010  9:46 AM 81.0  0.0 - 100.0 pg/mL Final  11/06/2008  3:07 PM 64.0  0.0 - 100.0 pg/mL Final   Miscellaneous Lab Results  Component Value Date   DDIMER  Value: 0.29        AT THE INHOUSE ESTABLISHED CUTOFF VALUE OF 0.48 ug/mL FEU, THIS ASSAY HAS BEEN DOCUMENTED IN THE LITERATURE TO HAVE A SENSITIVITY AND NEGATIVE PREDICTIVE VALUE OF AT LEAST 98 TO 99%.  THE TEST RESULT SHOULD BE CORRELATED WITH AN ASSESSMENT OF THE CLINICAL PROBABILITY OF DVT / VTE. 06/29/2009    Radiology/Studies:  No results found.  EKG: Sinus sinus rhythm at 61 Intervals 14/11/45 Axis XXXIV Right bundle branch block-incomplete    Assessment  and Plan:  Atrial fibrillation-paroxysmal-rapid ventricular response  Right bundle branch block-incomplete  Coronary disease with prior bypass  Hypokalemia-history of  The patient has paroxysmal atrial fibrillation with a rapid rate. She has been intolerant of amiodarone. She has coronary disease which precludes 1C therapy. Her QTC is borderline but acceptable currently for dofetilide and  would recommend a trial of that. In the event that were unsuccessful, I would refer her to Dr. Greggory Brandy for consideration of catheter ablation of her atrial fibrillation. Left atrial dimension was normal 4/14.  Is possible that her thyroid is contributing to her issues, but I suspect that Dr. Brigitte Pulse has checked her TSH; recently had no records. I will contact his office in this regard.  Her daughter has recently undergone cancer surgery. 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 She will let us She has 3 days to the hospital for dofetilide loading    Renee [name]

## 2013-08-26 NOTE — Patient Instructions (Signed)
Please call us when you are ready to schedule your 3 day hospital stay for Tikosyn medication loading.  Trinidad Curet, RN  [phone]

## 2013-08-28 ENCOUNTER — Encounter: Payer: Self-pay | Admitting: Critical Care Medicine

## 2013-08-28 ENCOUNTER — Ambulatory Visit (INDEPENDENT_AMBULATORY_CARE_PROVIDER_SITE_OTHER): Payer: Medicare Other | Admitting: Critical Care Medicine

## 2013-08-28 VITALS — BP 110/60 | HR 64 | Temp 97.0°F | Ht 66.0 in | Wt 156.0 lb

## 2013-08-28 DIAGNOSIS — J449 Chronic obstructive pulmonary disease, unspecified: Secondary | ICD-10-CM

## 2013-08-28 DIAGNOSIS — I2510 Atherosclerotic heart disease of native coronary artery without angina pectoris: Secondary | ICD-10-CM

## 2013-08-28 DIAGNOSIS — J441 Chronic obstructive pulmonary disease with (acute) exacerbation: Secondary | ICD-10-CM | POA: Diagnosis not present

## 2013-08-28 NOTE — Assessment & Plan Note (Signed)
Gold stage C. COPD with exertional dyspnea but no desaturation with a motoric walk test on this visit 08/28/2013 The patient desires to requalified for new portable oxygen concentrator system In order to do so this patient will need to drop below 88% and she doesn't do this today with this visit Plan  6 minute walk with exertional oxygen check Maintain inhaled medications as

## 2013-08-28 NOTE — Patient Instructions (Signed)
We will schedule you to have a Six Minute Walk test on Room Air No changes Follow up with Dr. Joya Gaskins in 4 months

## 2013-08-28 NOTE — Progress Notes (Signed)
Subjective:    Patient ID: Renee [name], female    DOB: 1934/04/27, 79 y.o.   MRN: [id number] PCP [name], MD Pulmonary: Dr Asencion Noble  HPI   IOV    COPD - March 2-014: Fev1 1.4L/64%, RAtio 73. COPD -quit smoking 09/21/09 -Gold C . Also has CAD with diast dysfn. She reports mania  to prednisone but apparently can have IM steroid shots in office (last recorded dec 2014 in office).  She has hx of recurrent AECOPD'; last abx Rx with cefdinir in feb 2015.Past 2 days reports insidious onset of scratchy throat, increased cough, increased sputum volume and change in sputum from white to green. There is associated chest tightness, dyspnea that is worse . OPVerall severity is mild to moderate worsening. No fever.   08/28/2013 Chief Complaint  Patient presents with  . Follow-up    Pt reports being dx with Shingles within the last 2 weeks. Completed course of Valtrex. Pt reports no change in breathing since last OV-- no Complaints.    Pt doing well except shingles.  No real mucus. No real wheeze.   The patient desires a new portable oxygen concentrator Pt denies any significant sore throat, nasal congestion or excess secretions, fever, chills, sweats, unintended weight loss, pleurtic or exertional chest pain, orthopnea PND, or leg swelling Pt denies any increase in rescue therapy over baseline, denies waking up needing it or having any early am or nocturnal exacerbations of coughing/wheezing/or dyspnea. Pt also denies any obvious fluctuation in symptoms with  weather or environmental change or other alleviating or aggravating factors    Review of Systems  Constitutional: Negative for fever and unexpected weight change.  HENT: Positive for congestion, rhinorrhea, sore throat and trouble swallowing. Negative for dental problem, ear pain, nosebleeds, postnasal drip, sinus pressure and sneezing.   Eyes: Negative for redness and itching.  Respiratory: Positive for cough and shortness of  breath. Negative for chest tightness and wheezing.   Cardiovascular: Positive for chest pain. Negative for palpitations and leg swelling.  Gastrointestinal: Positive for nausea. Negative for vomiting.  Genitourinary: Negative for dysuria.  Musculoskeletal: Negative for joint swelling.  Skin: Negative for rash.  Neurological: Negative for headaches.  Hematological: Does not bruise/bleed easily.  Psychiatric/Behavioral: Negative for dysphoric mood. The patient is not nervous/anxious.        Objective:   Physical Exam  Vitals reviewed. Constitutional: She is oriented to person, place, and time. She appears well-developed and well-nourished. No distress.  Body mass index is 25.03 kg/(m^2).   HENT:  Head: Normocephalic and atraumatic.  Right Ear: External ear normal.  Left Ear: External ear normal.  Mouth/Throat: Oropharynx is clear and moist. No oropharyngeal exudate.  Eyes: Conjunctivae and EOM are normal. Pupils are equal, round, and reactive to light. Right eye exhibits no discharge. Left eye exhibits no discharge. No scleral icterus.  Neck: Normal range of motion. Neck supple. No JVD present. No tracheal deviation present. No thyromegaly present.  Cardiovascular: Normal rate, regular rhythm, normal heart sounds and intact distal pulses.  Exam reveals no gallop and no friction rub.   No murmur heard. Pulmonary/Chest: Effort normal. No respiratory distress. She has wheezes. She has no rales. She exhibits no tenderness.  occ wheeze+ barrell chest +  Abdominal: Soft. Bowel sounds are normal. She exhibits no distension and no mass. There is no tenderness. There is no rebound and no guarding.  Musculoskeletal: Normal range of motion. She exhibits no edema and no tenderness.  Lymphadenopathy:  She has no cervical adenopathy.  Neurological: She is alert and oriented to person, place, and time. She has normal reflexes. No cranial nerve deficit. She exhibits normal muscle tone. Coordination  normal.  Skin: Skin is warm and dry. No rash noted. She is not diaphoretic. No erythema. No pallor.  Psychiatric: She has a normal mood and affect. Her behavior is normal. Judgment and thought content normal.     Filed Vitals:   08/28/13 1145  Height: 5\' 6"  (1.676 m)  Weight: 156 lb (70.761 kg)        Assessment & Plan:   COPD Gold C Gold stage C. COPD with exertional dyspnea but no desaturation with a motoric walk test on this visit 08/28/2013 The patient desires to requalified for new portable oxygen concentrator system In order to do so this patient will need to drop below 88% and she doesn't do this today with this visit Plan  6 minute walk with exertional oxygen check Maintain inhaled medications as    Updated Medication List Outpatient Encounter Prescriptions as of 08/28/2013  Medication Sig  . Aclidinium Bromide (TUDORZA PRESSAIR) 400 MCG/ACT AEPB Inhale 1 puff into the lungs 2 (two) times daily.  [name] ALPRAZolam (XANAX) 0.25 MG tablet Take 1 tablet by mouth as needed.  [name] apixaban (ELIQUIS) 5 MG TABS tablet Take 1 tablet (5 mg total) by mouth 2 (two) times daily.  . Cyanocobalamin (VITAMIN B 12) 100 MCG LOZG Inject into the skin. ON HOLD  . diltiazem (CARDIZEM CD) 240 MG 24 hr capsule Take 240 mg by mouth daily.   [name] esomeprazole (NEXIUM) 40 MG capsule Take 40 mg by mouth daily before breakfast.    . furosemide (LASIX) 40 MG tablet TAKE 1 TABLET BY MOUTH TWICE DAILY  . levalbuterol (XOPENEX HFA) 45 MCG/ACT inhaler Inhale 2 puffs into the lungs every 6 (six) hours as needed for wheezing.  [name] levothyroxine (SYNTHROID, LEVOTHROID) 25 MCG tablet Take 25 mcg by mouth daily before breakfast.   . metoprolol tartrate (LOPRESSOR) 25 MG tablet TAKE 1 TABLET BY MOUTH TWICE DAILY  . nitroGLYCERIN (NITROSTAT) 0.4 MG SL tablet PLACE 1 TABLET UNDER THE TONGUE EVERY 5 MINUTES AS NEEDED FOR CHEST PAIN  . NON FORMULARY as needed. Oxygen - 3L pulse with extertion, 2L continuious QHS.  [name] Glycol-Propyl Glycol (SYSTANE OP) Apply 1 drop to eye daily.    . potassium chloride SA (K-DUR,KLOR-CON) 20 MEQ tablet Take 1 tablet (20 mEq total) by mouth 2 (two) times daily.  . traMADol (ULTRAM) 50 MG tablet Take 1 tablet (50 mg total) by mouth every 6 (six) hours as needed. Pain  . VITAMIN D, CHOLECALCIFEROL, PO Take 1 tablet by mouth daily.

## 2013-09-01 ENCOUNTER — Ambulatory Visit: Payer: Medicare Other

## 2013-09-09 ENCOUNTER — Ambulatory Visit (INDEPENDENT_AMBULATORY_CARE_PROVIDER_SITE_OTHER): Payer: Medicare Other | Admitting: Critical Care Medicine

## 2013-09-09 DIAGNOSIS — R0600 Dyspnea, unspecified: Secondary | ICD-10-CM

## 2013-09-09 DIAGNOSIS — R0609 Other forms of dyspnea: Secondary | ICD-10-CM

## 2013-09-09 DIAGNOSIS — R0989 Other specified symptoms and signs involving the circulatory and respiratory systems: Secondary | ICD-10-CM | POA: Diagnosis not present

## 2013-09-10 ENCOUNTER — Telehealth: Payer: Self-pay | Admitting: Critical Care Medicine

## 2013-09-10 DIAGNOSIS — J449 Chronic obstructive pulmonary disease, unspecified: Secondary | ICD-10-CM

## 2013-09-10 NOTE — Progress Notes (Signed)
Significant desaturation on room air is noted. Patient qualifies for home oxygen. Note on 4 L pulse concentrator system the patient's oxygen saturations are adequate however on room air her saturation drops to 69% thus she qualifies portable oxygen therapy  This patient continues to use and benefit from her oxygen therapy and has re qualified at this visit for continued oxygen therapy

## 2013-09-10 NOTE — Telephone Encounter (Signed)
Discussed walk test with the pt She needs a POC Would like to switch to [employer]  I am ok with this change  She qualitfies and needs 4L pulse

## 2013-09-11 NOTE — Telephone Encounter (Signed)
Called, spoke with pt.  She would like to talk with [employer] about switching to them.  She has a few questions regarding insurance and home concentrator.   Advised I have placed order to have [employer] call her about this and can provide POC at 4 lpm pulsed if pt decides she would like to proceed with switch.  She verbalized understanding, voiced no further questions or concerns at this time, and is to call back if anything further is needed.

## 2013-09-15 ENCOUNTER — Telehealth: Payer: Self-pay | Admitting: Critical Care Medicine

## 2013-09-15 NOTE — Telephone Encounter (Signed)
Order was placed on 09/11/13 to try to have pt's o2 switch to [employer]. Received fax from [place] [facility] with [employer] stating pt "can not switch to [employer] at this time.  She has used all of her oxygen rental months.  However, she resets on November 7,2016."  Called, spoke with pt.  Informed her of above.  She verbalized understanding.  Provided pt with [employer]'s phone # incase she has further questions regarding the insurance.   She verbalized understanding and voiced no further questions or concerns at this time.

## 2013-09-16 ENCOUNTER — Ambulatory Visit: Payer: Medicare Other | Admitting: Nurse Practitioner

## 2013-09-21 ENCOUNTER — Other Ambulatory Visit: Payer: Self-pay | Admitting: Cardiology

## 2013-09-28 DIAGNOSIS — Z6825 Body mass index (BMI) 25.0-25.9, adult: Secondary | ICD-10-CM | POA: Diagnosis not present

## 2013-09-28 DIAGNOSIS — R300 Dysuria: Secondary | ICD-10-CM | POA: Diagnosis not present

## 2013-09-28 DIAGNOSIS — J449 Chronic obstructive pulmonary disease, unspecified: Secondary | ICD-10-CM | POA: Diagnosis not present

## 2013-09-28 DIAGNOSIS — J309 Allergic rhinitis, unspecified: Secondary | ICD-10-CM | POA: Diagnosis not present

## 2013-09-28 DIAGNOSIS — R748 Abnormal levels of other serum enzymes: Secondary | ICD-10-CM | POA: Diagnosis not present

## 2013-09-28 DIAGNOSIS — R059 Cough, unspecified: Secondary | ICD-10-CM | POA: Diagnosis not present

## 2013-09-28 DIAGNOSIS — N390 Urinary tract infection, site not specified: Secondary | ICD-10-CM | POA: Diagnosis not present

## 2013-09-28 DIAGNOSIS — B029 Zoster without complications: Secondary | ICD-10-CM | POA: Diagnosis not present

## 2013-09-28 DIAGNOSIS — I10 Essential (primary) hypertension: Secondary | ICD-10-CM | POA: Diagnosis not present

## 2013-09-28 DIAGNOSIS — I4891 Unspecified atrial fibrillation: Secondary | ICD-10-CM | POA: Diagnosis not present

## 2013-09-28 DIAGNOSIS — R05 Cough: Secondary | ICD-10-CM | POA: Diagnosis not present

## 2013-10-05 ENCOUNTER — Telehealth: Payer: Self-pay | Admitting: Cardiology

## 2013-10-05 DIAGNOSIS — H35379 Puckering of macula, unspecified eye: Secondary | ICD-10-CM | POA: Diagnosis not present

## 2013-10-05 DIAGNOSIS — H43819 Vitreous degeneration, unspecified eye: Secondary | ICD-10-CM | POA: Diagnosis not present

## 2013-10-05 NOTE — Telephone Encounter (Signed)
Dr. Zadie Rhine would like for Dr. Martinique to call in reference to this patient who is scheduled for surgery on this Wednesday 10/07/13. Dr. Zadie Rhine would like a call @ [phone]. Message  will be sent to Dr. Martinique.

## 2013-10-06 ENCOUNTER — Telehealth: Payer: Self-pay | Admitting: Cardiology

## 2013-10-06 NOTE — Telephone Encounter (Signed)
Patient is scheduled for eye surgery tomorrow and Dr. Zadie Rhine would like cardiac clearance.

## 2013-10-06 NOTE — Telephone Encounter (Signed)
Dr.Jordan spoke to Herminie and cleared patient for eye surgery.

## 2013-10-06 NOTE — Telephone Encounter (Signed)
New message      Pt is having eye surgery tomorrow by Dr Zadie Rhine.  Need clearance for surgery.  Call [phone] or fax [phone] clearance to them.  She said they faxed a clearance to Dr Martinique but have not received it back.

## 2013-10-06 NOTE — Telephone Encounter (Signed)
Returned call to [place] with Dr.Rankin's office.Dr.Jordan spoke to Dr.Rankin earlier today and cleared patient for surgery.

## 2013-10-07 DIAGNOSIS — H35379 Puckering of macula, unspecified eye: Secondary | ICD-10-CM | POA: Diagnosis not present

## 2013-10-07 DIAGNOSIS — H5460 Unqualified visual loss, one eye, unspecified: Secondary | ICD-10-CM | POA: Diagnosis not present

## 2013-10-15 DIAGNOSIS — H35379 Puckering of macula, unspecified eye: Secondary | ICD-10-CM | POA: Diagnosis not present

## 2013-10-19 HISTORY — PX: RETINAL LASER PROCEDURE: SHX2339

## 2013-10-21 ENCOUNTER — Encounter: Payer: Self-pay | Admitting: Nurse Practitioner

## 2013-10-21 ENCOUNTER — Ambulatory Visit (INDEPENDENT_AMBULATORY_CARE_PROVIDER_SITE_OTHER): Payer: Medicare Other | Admitting: Nurse Practitioner

## 2013-10-21 VITALS — BP 110/72 | HR 72 | Ht 66.0 in | Wt 155.1 lb

## 2013-10-21 DIAGNOSIS — I2510 Atherosclerotic heart disease of native coronary artery without angina pectoris: Secondary | ICD-10-CM

## 2013-10-21 DIAGNOSIS — I25810 Atherosclerosis of coronary artery bypass graft(s) without angina pectoris: Secondary | ICD-10-CM

## 2013-10-21 DIAGNOSIS — Z7901 Long term (current) use of anticoagulants: Secondary | ICD-10-CM

## 2013-10-21 DIAGNOSIS — I4891 Unspecified atrial fibrillation: Secondary | ICD-10-CM

## 2013-10-21 DIAGNOSIS — I480 Paroxysmal atrial fibrillation: Secondary | ICD-10-CM

## 2013-10-21 NOTE — Patient Instructions (Addendum)
"  Tikosyn" is the drug Dr. Caryl Comes has recommended  We will get you a visit with the pharmacist here - Ronnald Collum, Pharm D  Stay on your current medicines for now  Tentatively I will see you in 6 months but will plan on seeing you back for your post hospital care.  Call the [place] office at [phone] if you have any questions, problems or concerns.

## 2013-10-21 NOTE — Progress Notes (Signed)
Renee [name] Cover Date of Birth: 1934-10-05 Medical Record #[id number]  History of Present Illness: Renee [name] is seen back today for a 4 month visit. Seen for Dr. Martinique. She has paroxysmal atrial fibrillation, known CAD with past CABG per EBG in August of 2011. Other issues include chronic diastolic heart failure, COPD, sinusitis, GERD, RBBB, anxiety, HTN, HLD and hypothyroidism.   She had a Myoview study in April 2014 which was normal. An echocardiogram at that time demonstrated normal LV function. She had mild mitral insufficiency without evidence of prolapse.   In October of 2014 she wore an event monitor. This demonstrated episodes of rapid Afib with rates up to 190 bpm. diltiazem 240 mg daily was added to metoprolol.   I saw her back in February after she was noted to be having more AF with RVR - Dr. Martinique had suggested increasing her diltiazem - she preferred to use prn. She was doing ok with this approach at her visit with me in March.   I saw her back in May of 2015 - having more AF and was symptomatic - I increased the diltiazem to BID. We have very limited options in regards to keeping her in sinus rhythm. Her only real option is felt to be AV nodal ablation with PPM placement - she has previously not wanted to go that route. She was subsequently referred to Dr. Caryl Comes in July for discussion -  He recommended Tikosyn followed by ablation if fails on Tikosyn but she has not wished to proceed at that time.  Comes back today. Here with her daughter today. She is doing pretty good right now. Her daughter is "cancer free". This has caused Renee [name] less stress. She says her rhythm has been ok. She has been hesitant to proceed with Tikosyn - did not realize that we have a protocol to go thru in order for this to be initiated. Breathing is ok. No chest pain. Tolerating her medicines.    Current Outpatient Prescriptions  Medication Sig Dispense Refill  . Aclidinium Bromide (TUDORZA PRESSAIR) 400  MCG/ACT AEPB Inhale 1 puff into the lungs 2 (two) times daily.  1 each  0  . ALPRAZolam (XANAX) 0.25 MG tablet Take 1 tablet by mouth as needed.      [name] apixaban (ELIQUIS) 5 MG TABS tablet Take 1 tablet (5 mg total) by mouth 2 (two) times daily.  60 tablet  11  . Cyanocobalamin (VITAMIN B 12) 100 MCG LOZG Inject into the skin. ON HOLD      . diltiazem (CARDIZEM CD) 240 MG 24 hr capsule Take 240 mg by mouth daily.       [name] esomeprazole (NEXIUM) 40 MG capsule Take 40 mg by mouth daily before breakfast.        . furosemide (LASIX) 40 MG tablet TAKE 1 TABLET BY MOUTH TWICE DAILY  60 tablet  5  . levalbuterol (XOPENEX HFA) 45 MCG/ACT inhaler Inhale 2 puffs into the lungs every 6 (six) hours as needed for wheezing.      [name] levothyroxine (SYNTHROID, LEVOTHROID) 25 MCG tablet Take 25 mcg by mouth daily before breakfast.       . metoprolol tartrate (LOPRESSOR) 25 MG tablet TAKE 1 TABLET BY MOUTH TWICE DAILY  30 tablet  0  . nitroGLYCERIN (NITROSTAT) 0.4 MG SL tablet PLACE 1 TABLET UNDER THE TONGUE EVERY 5 MINUTES AS NEEDED FOR CHEST PAIN  25 tablet  3  . NON FORMULARY as needed. Oxygen - 3L pulse with extertion,  2L continuious QHS.      [name] Glycol-Propyl Glycol (SYSTANE OP) Apply 1 drop to eye daily.        . potassium chloride SA (K-DUR,KLOR-CON) 20 MEQ tablet TAKE 1 TABLET BY MOUTH TWICE DAILY  60 tablet  0  . traMADol (ULTRAM) 50 MG tablet Take 1 tablet (50 mg total) by mouth every 6 (six) hours as needed. Pain  30 tablet  0  . VITAMIN D, CHOLECALCIFEROL, PO Take 1 tablet by mouth daily.        . [DISCONTINUED] budesonide-formoterol (SYMBICORT) 160-4.5 MCG/ACT inhaler Inhale 2 puffs into the lungs 2 (two) times daily.  1 Inhaler  12  . [DISCONTINUED] calcium citrate-vitamin D (CITRACAL+D) 315-200 MG-UNIT per tablet Take 1 tablet by mouth 2 (two) times daily.         No current facility-administered medications for this visit.    Allergies  Allergen Reactions  . Spiriva [Tiotropium Bromide  Monohydrate] Other (See Comments)    Throat irritation  . Statins     Legs hurt  . Levofloxacin Nausea Only    Unable to tolerate more than 7days  . Budesonide-Formoterol Fumarate     Tachycardia  . Morphine     REACTION: double vision  . Penicillins     REACTION: swelling/rash  . Phenazopyridine Hcl     *piridium* REACTION: face red  . Pneumococcal Vaccine     REACTION: rash and cough  . Pneumococcal Vaccines Swelling    Redding of the skin  . Prednisone     REACTION: rash  . Prednisone Swelling  . Sulfonamide Derivatives     REACTION: rash  . Tylenol [Acetaminophen]     Tylenol #3 -- rash and swelling  . Penicillins Swelling and Rash  . Sulfa Antibiotics Rash    Past Medical History  Diagnosis Date  . GERD (gastroesophageal reflux disease)   . IBS (irritable bowel syndrome)   . Bundle branch block, right   . Coronary artery disease     prior CABG in August of 2011  . Emphysema/COPD   . Fatigue   . Joint pain   . Anxiety   . AF (atrial fibrillation)     Post op after CABG; off amiodarone  . HTN (hypertension)   . CHF (congestive heart failure)   . Dysphagia   . Colon polyps   . Hypertension   . Hypercholesteremia   . Normal nuclear stress test October 2012  . Myocardial infarction   . Hypothyroidism   . Palpitations     Past Surgical History  Procedure Laterality Date  . Coronary artery bypass graft    . Total hip arthroplasty  11/01/2008    right  . Appendectomy  1995  . Cholecystectomy  2008    LARYNX  . Tonsillectomy and adenoidectomy    . Total abdominal hysterectomy  1986  . Cardiac surgery    . Joint replacement      Right Hip 2010    History  Smoking status  . Former Smoker -- 1.00 packs/day for 50 years  . Types: Cigarettes  . Quit date: 09/21/2009  Smokeless tobacco  . Never Used    History  Alcohol Use No    Family History  Problem Relation Age of Onset  . Heart disease Mother   . Transient ischemic attack Mother   .  Hypertension Mother   . Deep vein thrombosis Mother   . Heart attack Father   . Hypertension Father   .  Diabetes Father   . Heart disease Father     Heart disease before age 6  . Hyperlipidemia Father   . Breast cancer Sister   . Cancer Sister   . Other Sister     brain tumor  . Colon polyps Brother   . Hypertension Brother   . Colon cancer Daughter     Review of Systems: The review of systems is per the HPI.  All other systems were reviewed and are negative.  Physical Exam: BP 110/72  Pulse 72  Ht 5\' 6"  (1.676 m)  Wt 155 lb 1.9 oz (70.362 kg)  BMI 25.05 kg/m2 Patient is very pleasant and in no acute distress. Less anxious today. Skin is warm and dry. Color is normal.  HEENT is unremarkable. Normocephalic/atraumatic. PERRL. Sclera are nonicteric. Neck is supple. No masses. No JVD. Lungs are clear. Cardiac exam shows a regular rate and rhythm. Abdomen is soft. Extremities are without edema. Gait and ROM are intact. No gross neurologic deficits noted.  Wt Readings from Last 3 Encounters:  10/21/13 155 lb 1.9 oz (70.362 kg)  08/28/13 156 lb (70.761 kg)  08/26/13 157 lb (71.215 kg)    LABORATORY DATA/PROCEDURES:  Lab Results  Component Value Date   WBC 9.8 03/17/2011   HGB 13.4 03/17/2011   HCT 43.3 03/17/2011   PLT 217 09/25/2009   GLUCOSE 86 12/26/2010   CHOL  Value: 207        ATP III CLASSIFICATION:  <200     mg/dL   Desirable  200-239  mg/dL   Borderline High  >=240    mg/dL   High* 07/16/2007   TRIG 116 07/16/2007   HDL 45 07/16/2007   LDLCALC  Value: 139        Total Cholesterol/HDL:CHD Risk Coronary Heart Disease Risk Table                     Men   Women  1/2 Average Risk   3.4   3.3* 07/16/2007   ALT 15 09/21/2009   AST 23 09/21/2009   NA 140 12/26/2010   K 4.0 12/26/2010   CL 100 12/26/2010   CREATININE 1.1 12/26/2010   BUN 14 12/26/2010   CO2 29 12/26/2010   TSH 0.756 09/22/2009   INR 1.22 09/21/2009   HGBA1C  Value: 5.7 (NOTE)                                                                        According to the ADA Clinical Practice Recommendations for 2011, when HbA1c is used as a screening test:   >=6.5%   Diagnostic of Diabetes Mellitus           (if abnormal result  is confirmed)  5.7-6.4%   Increased risk of developing Diabetes Mellitus  References:Diagnosis and Classification of Diabetes Mellitus,Diabetes PTWS,5681,27(NTZGY 1):S62-S69 and Standards of Medical Care in         Diabetes - 2011,Diabetes Care,2011,34  (Suppl 1):S11-S61.* 09/19/2009    BNP (last 3 results) No results found for this basename: PROBNP,  in the last 8760 hours   Assessment / Plan: 1. CAD - with past CABG - no ischemia noted on Myoview study from 2014. Would continue  with medical management. She currently has no symptoms.   2. Dyspnea - echocardiogram and Myoview study have been unremarkable. It is suspected that her shortness of breath his more related to her COPD.   3. Paroxysmal atrial fibrillation. CHAD-VASC score of 4.On Eliquis 5 mg twice a day. From the standpoint of antiarrhythmic drug therapy her options remain very limited. She has seen Dr. Caryl Comes who has advised "She has been intolerant of amiodarone. She has coronary disease which precludes 1C therapy. Her QTC is borderline but acceptable currently for dofetilide and would recommend a trial of that. In the event that were unsuccessful, I would refer her to Dr. Greggory Brandy for consideration of catheter ablation of her atrial fibrillation. Left atrial dimension was normal 4/14." She is agreeable to seeing the pharmacist here to initiate the process. I will be happy to see back for her post hospital care if she chooses to proceed.   4. Hypertension. BP looks good on current regimen.   5. COPD - followed by Dr. Joya Gaskins.   Labs are checked by PCP. She is felt to be stable at this time from our standpoint but I am sure it will be just a matter of time before she begins to have more rhythm issues. Tentatively see in 6 months.    Patient is agreeable to this plan and will call if any problems develop in the interim.   Burtis Junes, RN, [facility] [address] [place], [place]  [address] [phone]

## 2013-10-23 ENCOUNTER — Other Ambulatory Visit: Payer: Self-pay | Admitting: *Deleted

## 2013-10-23 ENCOUNTER — Other Ambulatory Visit: Payer: Self-pay

## 2013-10-23 MED ORDER — DILTIAZEM HCL ER COATED BEADS 240 MG PO CP24: 240.0000 mg | ORAL_CAPSULE | Freq: Every day | ORAL | Status: DC

## 2013-10-23 MED ORDER — APIXABAN 5 MG PO TABS: 5.0000 mg | ORAL_TABLET | Freq: Two times a day (BID) | ORAL | Status: DC

## 2013-10-23 MED ORDER — POTASSIUM CHLORIDE CRYS ER 20 MEQ PO TBCR: EXTENDED_RELEASE_TABLET | ORAL | Status: DC

## 2013-10-26 ENCOUNTER — Ambulatory Visit (INDEPENDENT_AMBULATORY_CARE_PROVIDER_SITE_OTHER): Payer: Medicare Other | Admitting: Pharmacist

## 2013-10-26 DIAGNOSIS — I480 Paroxysmal atrial fibrillation: Secondary | ICD-10-CM

## 2013-10-26 DIAGNOSIS — I4891 Unspecified atrial fibrillation: Secondary | ICD-10-CM

## 2013-10-26 NOTE — Progress Notes (Signed)
Renee [name] is a 79 yo F pt of Dr. Caryl Comes.  She was seen by him in July for atrial fibrillation.  At that time, he recommended Tikosyn therapy followed by ablation if Tikosyn failed.  Pt was hesitant to start Tikosyn at that time.  She was seen by Truitt Merle on 9/16.  She was feeling okay and Tikosyn was discussed again.  She was sent to the pharmacist today to discuss Tikosyn and set up a date for initiation.    Pt is accompanied by her husband.  We discussed the role of Tikosyn as an antiarrhythmic verus other options.  She has a long list of intolerances so she is concerned with side effects.  She is aware of the potential side effects including QTc prolongation and the reason for the 3 day hospitalization.  We discussed cost of the medication.  She still has a private plan for her medications so she was given a $10 copay card.  She is aware of the importance of not missing a dose and calling if she misses more than 2 doses in a row.    Reviewed pt's medication list.  She is currently not taking any QTc prolongating or contraindicated medications.  She is anticoagulated with Eliquis and reports compliance.  Her labs were last checked in May by her PCP and were at goal at that time.    Pt feels comfortable starting Tikosyn at this time.  We have set up an appt for admission on Oct 12.  She will call with any questions in the interium.

## 2013-10-27 DIAGNOSIS — Z1231 Encounter for screening mammogram for malignant neoplasm of breast: Secondary | ICD-10-CM | POA: Diagnosis not present

## 2013-10-30 ENCOUNTER — Other Ambulatory Visit: Payer: Self-pay | Admitting: *Deleted

## 2013-10-30 MED ORDER — ACLIDINIUM BROMIDE 400 MCG/ACT IN AEPB: 1.0000 | INHALATION_SPRAY | Freq: Two times a day (BID) | RESPIRATORY_TRACT | Status: DC

## 2013-11-10 DIAGNOSIS — I480 Paroxysmal atrial fibrillation: Secondary | ICD-10-CM | POA: Diagnosis not present

## 2013-11-10 DIAGNOSIS — I10 Essential (primary) hypertension: Secondary | ICD-10-CM | POA: Diagnosis not present

## 2013-11-10 DIAGNOSIS — R05 Cough: Secondary | ICD-10-CM | POA: Diagnosis not present

## 2013-11-10 DIAGNOSIS — J449 Chronic obstructive pulmonary disease, unspecified: Secondary | ICD-10-CM | POA: Diagnosis not present

## 2013-11-10 DIAGNOSIS — Z6825 Body mass index (BMI) 25.0-25.9, adult: Secondary | ICD-10-CM | POA: Diagnosis not present

## 2013-11-10 DIAGNOSIS — J309 Allergic rhinitis, unspecified: Secondary | ICD-10-CM | POA: Diagnosis not present

## 2013-11-13 DIAGNOSIS — Z23 Encounter for immunization: Secondary | ICD-10-CM | POA: Diagnosis not present

## 2013-11-16 ENCOUNTER — Ambulatory Visit (INDEPENDENT_AMBULATORY_CARE_PROVIDER_SITE_OTHER): Payer: Medicare Other | Admitting: Pharmacist

## 2013-11-16 DIAGNOSIS — I480 Paroxysmal atrial fibrillation: Secondary | ICD-10-CM | POA: Diagnosis not present

## 2013-11-16 DIAGNOSIS — I2510 Atherosclerotic heart disease of native coronary artery without angina pectoris: Secondary | ICD-10-CM

## 2013-11-16 LAB — BASIC METABOLIC PANEL
BUN: 15 mg/dL (ref 6–23)
CHLORIDE: 98 meq/L (ref 96–112)
CO2: 28 mEq/L (ref 19–32)
CREATININE: 1.2 mg/dL (ref 0.4–1.2)
Calcium: 9.5 mg/dL (ref 8.4–10.5)
GFR: 46.29 mL/min — ABNORMAL LOW (ref >60.00)
GLUCOSE: 85 mg/dL (ref 70–99)
Potassium: 3.7 mEq/L (ref 3.5–5.1)
Sodium: 137 mEq/L (ref 135–145)

## 2013-11-16 LAB — MAGNESIUM: MAGNESIUM: 2.2 mg/dL (ref 1.5–2.5)

## 2013-11-16 NOTE — Assessment & Plan Note (Signed)
Reviewed pt's labs.  K low at 3.7 today.  Pt typically takes 70mEq BID as well as furosemide BID.  She has not taken her second dose of either today.  Will have her hold furosemide tonight then take 1mEq tonight and tomorrow morning.  Recheck labs in the AM.  If K >4.0, will admit for Tikosyn tomorrow.

## 2013-11-16 NOTE — Progress Notes (Signed)
Renee [name] is a 79 yo F pt of Dr. Caryl Comes.  She was seen by him in July for atrial fibrillation.  At that time, he recommended Tikosyn therapy followed by ablation if Tikosyn failed.  Pt was hesitant to start Tikosyn at that time.  She was seen by Truitt Merle on 9/16.  She was feeling okay and Tikosyn was discussed again.    Pt is accompanied by her husband.  We discussed the role of Tikosyn as an antiarrhythmic verus other options.  She has a long list of intolerances so she is concerned with side effects.  She is aware of the potential side effects including QTc prolongation and the reason for the 3 day hospitalization.  We discussed cost of the medication.  She still has a private plan for her medications so she was given a $10 copay card.  She is aware of the importance of not missing a dose and calling if she misses more than 2 doses in a row.    Reviewed pt's medication list.  She is currently not taking any QTc prolongating or contraindicated medications.  She is anticoagulated with Eliquis and reports compliance.     EKG reviewed by Dr. Rayann Heman showed normal sinus rhythm with QTc 426 msec. Vent rate of 66.   Current Outpatient Prescriptions  Medication Sig Dispense Refill  . Aclidinium Bromide (TUDORZA PRESSAIR) 400 MCG/ACT AEPB Inhale 1 puff into the lungs 2 (two) times daily.  3 each  2  . ALPRAZolam (XANAX) 0.25 MG tablet Take 1 tablet by mouth as needed for anxiety or sleep.       [name] apixaban (ELIQUIS) 5 MG TABS tablet Take 1 tablet (5 mg total) by mouth 2 (two) times daily.  180 tablet  1  . diltiazem (CARDIZEM CD) 240 MG 24 hr capsule Take 1 capsule (240 mg total) by mouth daily.  90 capsule  1  . esomeprazole (NEXIUM) 40 MG capsule Take 40 mg by mouth daily before breakfast.        . furosemide (LASIX) 40 MG tablet TAKE 1 TABLET BY MOUTH TWICE DAILY  60 tablet  5  . levalbuterol (XOPENEX HFA) 45 MCG/ACT inhaler Inhale 2 puffs into the lungs every 6 (six) hours as needed for wheezing.       [name] levothyroxine (SYNTHROID, LEVOTHROID) 25 MCG tablet Take 25 mcg by mouth daily before breakfast.       . metoprolol tartrate (LOPRESSOR) 25 MG tablet TAKE 1 TABLET BY MOUTH TWICE DAILY  30 tablet  0  . nitroGLYCERIN (NITROSTAT) 0.4 MG SL tablet PLACE 1 TABLET UNDER THE TONGUE EVERY 5 MINUTES AS NEEDED FOR CHEST PAIN  25 tablet  3  . NON FORMULARY as needed. Oxygen - 3L pulse with extertion, 2L continuious QHS.      [name] Glycol-Propyl Glycol (SYSTANE OP) Place 1 drop into both eyes daily.       . potassium chloride SA (K-DUR,KLOR-CON) 20 MEQ tablet TAKE 1 TABLET BY MOUTH TWICE DAILY  180 tablet  2  . traMADol (ULTRAM) 50 MG tablet Take 1 tablet (50 mg total) by mouth every 6 (six) hours as needed. Pain  30 tablet  0  . VITAMIN D, CHOLECALCIFEROL, PO Take 1 tablet by mouth daily.        . [DISCONTINUED] budesonide-formoterol (SYMBICORT) 160-4.5 MCG/ACT inhaler Inhale 2 puffs into the lungs 2 (two) times daily.  1 Inhaler  12  . [DISCONTINUED] calcium citrate-vitamin D (CITRACAL+D) 315-200 MG-UNIT per tablet Take  1 tablet by mouth 2 (two) times daily.         No current facility-administered medications for this visit.   Allergies  Allergen Reactions  . Spiriva [Tiotropium Bromide Monohydrate] Other (See Comments)    Throat irritation  . Statins     Legs hurt  . Levofloxacin Nausea Only    Unable to tolerate more than 7days  . Budesonide-Formoterol Fumarate     Tachycardia  . Morphine     REACTION: double vision  . Penicillins     REACTION: swelling/rash  . Phenazopyridine Hcl     *piridium* REACTION: face red  . Pneumococcal Vaccine     REACTION: rash and cough  . Pneumococcal Vaccines Swelling    Redding of the skin  . Prednisone     REACTION: rash  . Prednisone Swelling  . Sulfonamide Derivatives     REACTION: rash  . Tylenol [Acetaminophen]     Tylenol #3 -- rash and swelling  . Penicillins Swelling and Rash  . Sulfa Antibiotics Rash

## 2013-11-17 ENCOUNTER — Other Ambulatory Visit (INDEPENDENT_AMBULATORY_CARE_PROVIDER_SITE_OTHER): Payer: Medicare Other | Admitting: *Deleted

## 2013-11-17 ENCOUNTER — Inpatient Hospital Stay (HOSPITAL_COMMUNITY)
Admission: AD | Admit: 2013-11-17 | Discharge: 2013-11-20 | DRG: 310 | Disposition: A | Discharge status: Home / Self Care | Payer: Medicare Other | Source: Ambulatory Visit | Attending: Internal Medicine | Admitting: Internal Medicine

## 2013-11-17 ENCOUNTER — Encounter (HOSPITAL_COMMUNITY): Payer: Self-pay | Admitting: General Practice

## 2013-11-17 DIAGNOSIS — K589 Irritable bowel syndrome without diarrhea: Secondary | ICD-10-CM | POA: Diagnosis present

## 2013-11-17 DIAGNOSIS — R1310 Dysphagia, unspecified: Secondary | ICD-10-CM | POA: Diagnosis present

## 2013-11-17 DIAGNOSIS — Z9049 Acquired absence of other specified parts of digestive tract: Secondary | ICD-10-CM | POA: Diagnosis present

## 2013-11-17 DIAGNOSIS — Z7901 Long term (current) use of anticoagulants: Secondary | ICD-10-CM

## 2013-11-17 DIAGNOSIS — E039 Hypothyroidism, unspecified: Secondary | ICD-10-CM | POA: Diagnosis present

## 2013-11-17 DIAGNOSIS — F419 Anxiety disorder, unspecified: Secondary | ICD-10-CM | POA: Diagnosis present

## 2013-11-17 DIAGNOSIS — R002 Palpitations: Secondary | ICD-10-CM | POA: Diagnosis present

## 2013-11-17 DIAGNOSIS — K219 Gastro-esophageal reflux disease without esophagitis: Secondary | ICD-10-CM | POA: Diagnosis present

## 2013-11-17 DIAGNOSIS — Z882 Allergy status to sulfonamides status: Secondary | ICD-10-CM | POA: Diagnosis not present

## 2013-11-17 DIAGNOSIS — Z881 Allergy status to other antibiotic agents status: Secondary | ICD-10-CM

## 2013-11-17 DIAGNOSIS — I10 Essential (primary) hypertension: Secondary | ICD-10-CM | POA: Diagnosis present

## 2013-11-17 DIAGNOSIS — Z86010 Personal history of colonic polyps: Secondary | ICD-10-CM

## 2013-11-17 DIAGNOSIS — Z888 Allergy status to other drugs, medicaments and biological substances status: Secondary | ICD-10-CM | POA: Diagnosis not present

## 2013-11-17 DIAGNOSIS — Z887 Allergy status to serum and vaccine status: Secondary | ICD-10-CM

## 2013-11-17 DIAGNOSIS — J449 Chronic obstructive pulmonary disease, unspecified: Secondary | ICD-10-CM | POA: Diagnosis not present

## 2013-11-17 DIAGNOSIS — Z90711 Acquired absence of uterus with remaining cervical stump: Secondary | ICD-10-CM | POA: Diagnosis present

## 2013-11-17 DIAGNOSIS — I252 Old myocardial infarction: Secondary | ICD-10-CM

## 2013-11-17 DIAGNOSIS — Z79899 Other long term (current) drug therapy: Secondary | ICD-10-CM | POA: Diagnosis not present

## 2013-11-17 DIAGNOSIS — Z87891 Personal history of nicotine dependence: Secondary | ICD-10-CM

## 2013-11-17 DIAGNOSIS — M1611 Unilateral primary osteoarthritis, right hip: Secondary | ICD-10-CM | POA: Diagnosis present

## 2013-11-17 DIAGNOSIS — I4510 Unspecified right bundle-branch block: Secondary | ICD-10-CM | POA: Diagnosis present

## 2013-11-17 DIAGNOSIS — E780 Pure hypercholesterolemia: Secondary | ICD-10-CM | POA: Diagnosis present

## 2013-11-17 DIAGNOSIS — Z96641 Presence of right artificial hip joint: Secondary | ICD-10-CM | POA: Diagnosis present

## 2013-11-17 DIAGNOSIS — M5430 Sciatica, unspecified side: Secondary | ICD-10-CM | POA: Diagnosis present

## 2013-11-17 DIAGNOSIS — Z8249 Family history of ischemic heart disease and other diseases of the circulatory system: Secondary | ICD-10-CM

## 2013-11-17 DIAGNOSIS — Z885 Allergy status to narcotic agent status: Secondary | ICD-10-CM | POA: Diagnosis not present

## 2013-11-17 DIAGNOSIS — Z9981 Dependence on supplemental oxygen: Secondary | ICD-10-CM

## 2013-11-17 DIAGNOSIS — I2510 Atherosclerotic heart disease of native coronary artery without angina pectoris: Secondary | ICD-10-CM | POA: Diagnosis present

## 2013-11-17 DIAGNOSIS — M2550 Pain in unspecified joint: Secondary | ICD-10-CM | POA: Diagnosis present

## 2013-11-17 DIAGNOSIS — I480 Paroxysmal atrial fibrillation: Secondary | ICD-10-CM | POA: Diagnosis present

## 2013-11-17 DIAGNOSIS — Z880 Allergy status to penicillin: Secondary | ICD-10-CM | POA: Diagnosis not present

## 2013-11-17 DIAGNOSIS — Z951 Presence of aortocoronary bypass graft: Secondary | ICD-10-CM

## 2013-11-17 DIAGNOSIS — I369 Nonrheumatic tricuspid valve disorder, unspecified: Secondary | ICD-10-CM | POA: Diagnosis not present

## 2013-11-17 DIAGNOSIS — I509 Heart failure, unspecified: Secondary | ICD-10-CM | POA: Diagnosis not present

## 2013-11-17 HISTORY — DX: Nausea with vomiting, unspecified: R11.2

## 2013-11-17 HISTORY — DX: Pneumonia, unspecified organism: J18.9

## 2013-11-17 HISTORY — DX: Sciatica, unspecified side: M54.30

## 2013-11-17 HISTORY — DX: Unspecified chronic bronchitis: J42

## 2013-11-17 HISTORY — DX: Other specified postprocedural states: Z98.890

## 2013-11-17 LAB — MAGNESIUM: Magnesium: 2.1 mg/dL (ref 1.5–2.5)

## 2013-11-17 LAB — BASIC METABOLIC PANEL
BUN: 18 mg/dL (ref 6–23)
CALCIUM: 9.2 mg/dL (ref 8.4–10.5)
CHLORIDE: 106 meq/L (ref 96–112)
CO2: 26 mEq/L (ref 19–32)
CREATININE: 1.1 mg/dL (ref 0.4–1.2)
GFR: 50.64 mL/min — ABNORMAL LOW (ref >60.00)
Glucose, Bld: 82 mg/dL (ref 70–99)
Potassium: 4.3 mEq/L (ref 3.5–5.1)
Sodium: 142 mEq/L (ref 135–145)

## 2013-11-17 MED ORDER — ALBUTEROL SULFATE (2.5 MG/3ML) 0.083% IN NEBU: 2.5000 mg | INHALATION_SOLUTION | Freq: Four times a day (QID) | RESPIRATORY_TRACT | Status: DC | PRN

## 2013-11-17 MED ORDER — FUROSEMIDE 40 MG PO TABS
40.0000 mg | ORAL_TABLET | Freq: Every day | ORAL | Status: DC
  Administered 2013-11-17 – 2013-11-18 (×2): 40 mg via ORAL
  Filled 2013-11-17 (×3): qty 1

## 2013-11-17 MED ORDER — SODIUM CHLORIDE 0.9 % IJ SOLN: 3.0000 mL | INTRAMUSCULAR | Status: DC | PRN

## 2013-11-17 MED ORDER — LEVOTHYROXINE SODIUM 25 MCG PO TABS
25.0000 ug | ORAL_TABLET | Freq: Every day | ORAL | Status: DC
  Administered 2013-11-18 – 2013-11-20 (×3): 25 ug via ORAL
  Filled 2013-11-17 (×7): qty 1

## 2013-11-17 MED ORDER — APIXABAN 5 MG PO TABS
5.0000 mg | ORAL_TABLET | Freq: Two times a day (BID) | ORAL | Status: DC
  Administered 2013-11-17 – 2013-11-20 (×6): 5 mg via ORAL
  Filled 2013-11-17 (×8): qty 1

## 2013-11-17 MED ORDER — DOFETILIDE 250 MCG PO CAPS
250.0000 ug | ORAL_CAPSULE | Freq: Two times a day (BID) | ORAL | Status: DC
Start: 2013-11-17 — End: 2013-11-19
  Administered 2013-11-17 – 2013-11-18 (×3): 250 ug via ORAL
  Filled 2013-11-17 (×6): qty 1

## 2013-11-17 MED ORDER — PANTOPRAZOLE SODIUM 40 MG PO TBEC
40.0000 mg | DELAYED_RELEASE_TABLET | Freq: Every day | ORAL | Status: DC
  Administered 2013-11-17 – 2013-11-20 (×4): 40 mg via ORAL
  Filled 2013-11-17 (×4): qty 1

## 2013-11-17 MED ORDER — METOPROLOL TARTRATE 25 MG PO TABS
25.0000 mg | ORAL_TABLET | Freq: Two times a day (BID) | ORAL | Status: DC
  Administered 2013-11-17 – 2013-11-20 (×6): 25 mg via ORAL
  Filled 2013-11-17 (×8): qty 1

## 2013-11-17 MED ORDER — DILTIAZEM HCL ER COATED BEADS 240 MG PO CP24
240.0000 mg | ORAL_CAPSULE | Freq: Every day | ORAL | Status: DC
  Administered 2013-11-18: 240 mg via ORAL
  Filled 2013-11-17 (×3): qty 1

## 2013-11-17 MED ORDER — SODIUM CHLORIDE 0.9 % IJ SOLN
3.0000 mL | Freq: Two times a day (BID) | INTRAMUSCULAR | Status: DC
  Administered 2013-11-18 – 2013-11-20 (×5): 3 mL via INTRAVENOUS

## 2013-11-17 MED ORDER — POTASSIUM CHLORIDE CRYS ER 20 MEQ PO TBCR
20.0000 meq | EXTENDED_RELEASE_TABLET | Freq: Every day | ORAL | Status: DC
  Administered 2013-11-17 – 2013-11-18 (×2): 20 meq via ORAL
  Filled 2013-11-17 (×3): qty 1

## 2013-11-17 MED ORDER — LEVALBUTEROL TARTRATE 45 MCG/ACT IN AERO: 2.0000 | INHALATION_SPRAY | Freq: Four times a day (QID) | RESPIRATORY_TRACT | Status: DC | PRN

## 2013-11-17 MED ORDER — SODIUM CHLORIDE 0.9 % IV SOLN: 250.0000 mL | INTRAVENOUS | Status: DC | PRN

## 2013-11-17 MED ORDER — ALPRAZOLAM 0.25 MG PO TABS
0.2500 mg | ORAL_TABLET | ORAL | Status: DC | PRN
  Administered 2013-11-17 – 2013-11-19 (×3): 0.25 mg via ORAL
  Filled 2013-11-17 (×3): qty 1

## 2013-11-17 NOTE — H&P (Signed)
ELECTROPHYSIOLOGY HISTORY AND PHYSICAL    Patient ID: LAPORCHE MARTELLE MRN: [id number], DOB/AGE: Jan 01, 1935 79 y.o.  Admit date: 11/17/2013  Primary Physician: [name], MD Primary Cardiologist: [place] Electrophysiologist: Caryl Comes  Reason for Admission: Tikosyn loading  HPI:  Renee [name] is a 79 y.o. female with a past medical history significant for paroxysmal atrial fibrillation, oxygen dependent COPD, CAD (prior CABG), hypothyroidism, hypertension, and anxiety.  She was first diagnosed with atrial fibrillation post CABG in August 2011. She has had increasing episodes of atrial fibrillation and wore an event monitor in October of 2014 with demonstrated AF with RVR and ventricular rates up to 190bpm.  Diltiazem and Metoprolol were prescribed for rate control.  She had recurrent AF with RVR despite this and was referred to Dr Caryl Comes for evaluation in 2011 at which time Tikosyn was recommended.  She deferred until recently and [place] admission was arranged for today.  She has been anticoagulated with Eliquis.   She is symptomatic with her atrial fibrillation with increased shortness of breath, lightheadedness, and chest discomfort.    She was previously on amiodarone but was intolerant due to worsening pulmonary function.   Last echo 05-2012 demonstrated EF 31-54%, grade 1 diastolic dysfunction, LA 34.   She has chronic dyspnea on exertion.  She denies pre-syncope, chest pain, lower extremity edema, recent fevers, chills, nausea or vomiting.  ROS is otherwise negative.      Past Medical History  Diagnosis Date  . GERD (gastroesophageal reflux disease)   . IBS (irritable bowel syndrome)   . Bundle branch block, right   . Coronary artery disease     prior CABG in August of 2011  . Emphysema/COPD   . Fatigue   . Joint pain   . Anxiety   . AF (atrial fibrillation)     Post op after CABG; off amiodarone  . HTN (hypertension)   . CHF (congestive heart failure)   .  Dysphagia   . Colon polyps   . Hypertension   . Hypercholesteremia   . Normal nuclear stress test October 2012  . Myocardial infarction   . Hypothyroidism   . Palpitations      Surgical History:  Past Surgical History  Procedure Laterality Date  . Coronary artery bypass graft    . Total hip arthroplasty  11/01/2008    right  . Appendectomy  1995  . Cholecystectomy  2008    LARYNX  . Tonsillectomy and adenoidectomy    . Total abdominal hysterectomy  1986  . Cardiac surgery    . Joint replacement      Right Hip 2010     Prescriptions prior to admission  Medication Sig Dispense Refill  . Aclidinium Bromide (TUDORZA PRESSAIR) 400 MCG/ACT AEPB Inhale 1 puff into the lungs 2 (two) times daily.  3 each  2  . ALPRAZolam (XANAX) 0.25 MG tablet Take 1 tablet by mouth as needed for anxiety or sleep.       [name] apixaban (ELIQUIS) 5 MG TABS tablet Take 1 tablet (5 mg total) by mouth 2 (two) times daily.  180 tablet  1  . diltiazem (CARDIZEM CD) 240 MG 24 hr capsule Take 1 capsule (240 mg total) by mouth daily.  90 capsule  1  . esomeprazole (NEXIUM) 40 MG capsule Take 40 mg by mouth daily before breakfast.        . furosemide (LASIX) 40 MG tablet TAKE 1 TABLET BY MOUTH TWICE DAILY  60 tablet  5  .  levalbuterol (XOPENEX HFA) 45 MCG/ACT inhaler Inhale 2 puffs into the lungs every 6 (six) hours as needed for wheezing.      [name] levothyroxine (SYNTHROID, LEVOTHROID) 25 MCG tablet Take 25 mcg by mouth daily before breakfast.       . metoprolol tartrate (LOPRESSOR) 25 MG tablet TAKE 1 TABLET BY MOUTH TWICE DAILY  30 tablet  0  . nitroGLYCERIN (NITROSTAT) 0.4 MG SL tablet PLACE 1 TABLET UNDER THE TONGUE EVERY 5 MINUTES AS NEEDED FOR CHEST PAIN  25 tablet  3  . NON FORMULARY as needed. Oxygen - 3L pulse with extertion, 2L continuious QHS.      [name] Glycol-Propyl Glycol (SYSTANE OP) Place 1 drop into both eyes daily.       . potassium chloride SA (K-DUR,KLOR-CON) 20 MEQ tablet TAKE 1 TABLET BY  MOUTH TWICE DAILY  180 tablet  2  . traMADol (ULTRAM) 50 MG tablet Take 1 tablet (50 mg total) by mouth every 6 (six) hours as needed. Pain  30 tablet  0  . VITAMIN D, CHOLECALCIFEROL, PO Take 1 tablet by mouth daily.          Inpatient Medications:   Allergies:  Allergies  Allergen Reactions  . Spiriva [Tiotropium Bromide Monohydrate] Other (See Comments)    Throat irritation  . Statins     Legs hurt  . Levofloxacin Nausea Only    Unable to tolerate more than 7days  . Budesonide-Formoterol Fumarate     Tachycardia  . Morphine     REACTION: double vision  . Penicillins     REACTION: swelling/rash  . Phenazopyridine Hcl     *piridium* REACTION: face red  . Pneumococcal Vaccine     REACTION: rash and cough  . Pneumococcal Vaccines Swelling    Redding of the skin  . Prednisone     REACTION: rash  . Prednisone Swelling  . Sulfonamide Derivatives     REACTION: rash  . Tylenol [Acetaminophen]     Tylenol #3 -- rash and swelling  . Penicillins Swelling and Rash  . Sulfa Antibiotics Rash    History   Social History  . Marital Status: Married    Spouse Name: N/A    Number of Children: 1  . Years of Education: N/A   Occupational History  . nurse     retired   Social History Main Topics  . Smoking status: Former Smoker -- 1.00 packs/day for 50 years    Types: Cigarettes    Quit date: 09/21/2009  . Smokeless tobacco: Never Used  . Alcohol Use: No  . Drug Use: No  . Sexual Activity: Not on file   Other Topics Concern  . Not on file   Social History Narrative   ** Merged History Encounter **       Daily caffeine use: Dr Malachi Bonds and 2 cups coffee per day     Family History  Problem Relation Age of Onset  . Heart disease Mother   . Transient ischemic attack Mother   . Hypertension Mother   . Deep vein thrombosis Mother   . Heart attack Father   . Hypertension Father   . Diabetes Father   . Heart disease Father     Heart disease before age 56  .  Hyperlipidemia Father   . Breast cancer Sister   . Cancer Sister   . Other Sister     brain tumor  . Colon polyps Brother   . Hypertension Brother   .  Colon cancer Daughter     Pulse 82  Temp(Src) 98.2 F (36.8 C) (Oral)  SpO2 94%  Physical Exam: Filed Vitals:   11/17/13 1600 11/17/13 1700  BP: 127/74   Pulse: 82   Temp: 98.2 F (36.8 C)   TempSrc: Oral   Height: 5\' 6"  (1.676 m) 5\' 6"  (1.676 m)  Weight:  152 lb 3.2 oz (69.037 kg)  SpO2: 94%     GEN- The patient is chronically ill appearing, alert and oriented x 3 today.   Head- normocephalic, atraumatic Eyes-  Sclera clear, conjunctiva pink Ears- hearing intact Oropharynx- clear Neck- supple, Lungs- prolonged expiratory phase Heart- Regular rate and rhythm  GI- soft, NT, ND, + BS Extremities- no clubbing, cyanosis, or edema, groin is without hematoma/ bruit MS- no significant deformity or atrophy Skin- no rash or lesion Psych- euthymic mood, full affect Neuro- strength and sensation are intact    Labs:   Lab Results  Component Value Date   WBC 9.8 03/17/2011   HGB 13.4 03/17/2011   HCT 43.3 03/17/2011   MCV 89.3 03/17/2011   PLT 217 09/25/2009    Recent Labs Lab 11/17/13 0828  NA 142  K 4.3  CL 106  CO2 26  BUN 18  CREATININE 1.1  CALCIUM 9.2  GLUCOSE 82    EKG: SR, RBBB, QTc 426  A/P  1. afib The patient has paroxysmal atrial fibrillation in the setting of O2 dependant COPD.  She is admitted for initiation of tikosyn.  I had a long discussion with the patient about initiation of tikosyn.  She is DNI/DNR typically but is willing to receive life saving measures including resuscitation should this be required for ventricular arrhythmias. CrCl is 47.  I will therefore initiate tikosyn 250 mcg BID today. Continue eliquis Repeat echo to assess for structural changes related to her afib.  2. Copd Continue home o2 Pharmacy to reconcile home medicines  3. Cad Stable No change required  today

## 2013-11-18 LAB — MAGNESIUM: MAGNESIUM: 2.1 mg/dL (ref 1.5–2.5)

## 2013-11-18 LAB — BASIC METABOLIC PANEL
ANION GAP: 11 (ref 5–15)
BUN: 16 mg/dL (ref 6–23)
CALCIUM: 9.2 mg/dL (ref 8.4–10.5)
CO2: 28 meq/L (ref 19–32)
CREATININE: 1.19 mg/dL — AB (ref 0.50–1.10)
Chloride: 103 mEq/L (ref 96–112)
GFR calc Af Amer: 50 mL/min — ABNORMAL LOW (ref >90)
GFR calc non Af Amer: 43 mL/min — ABNORMAL LOW (ref >90)
Glucose, Bld: 93 mg/dL (ref 70–99)
Potassium: 4.2 mEq/L (ref 3.7–5.3)
Sodium: 142 mEq/L (ref 137–147)

## 2013-11-18 MED ORDER — ACETAMINOPHEN 325 MG PO TABS: 325.0000 mg | ORAL_TABLET | Freq: Four times a day (QID) | ORAL | Status: DC | PRN

## 2013-11-18 NOTE — Progress Notes (Signed)
Pt received first dose of Tikosyn last evening at 2100.  EKG completed at 0015 showing QTC 524.  At approx 0430 this morning pt noted to be having intermittent episodes of Junctional Rhythm with HR 54-60's.  EKG done at this time and did not capture Junctional Rhythm but showed SB with BBB QTC 502 Pt is asymptomatic and VS stable.  MD on call paged to inform.  Next dose due this am at 0900 and will have them hold until evaluated by MD. Jessie Foot, RN

## 2013-11-18 NOTE — Care Management Note (Signed)
    Page 1 of 2   11/20/2013     10:25:11 AM CARE MANAGEMENT NOTE 11/20/2013  Patient:  Renee [name],Renee [name]   Account Number:  [id number]  Date Initiated:  11/18/2013  Documentation initiated by:  GRAVES-BIGELOW,Aamir Mclinden  Subjective/Objective Assessment:   Pt admitted for symptomatic atrial fibrillation, intolerant to amiodarone therapy secondary due to worsening pulmonary function- in for Tikosyn Load.     Action/Plan:   Benefits check in process for Tikosyn.  Pt uses [place] can be ordered.  Pt will need a Rx for 7 day supply to be filled before d/c. CM to assist with 7 day supply.   Anticipated DC Date:  11/21/2013   Anticipated DC Plan:  [place]  CM consult  Medication Assistance      Choice offered to / List presented to:             Status of service:  Completed, signed off Medicare Important Message given?  YES (If response is "NO", the following Medicare IM given date fields will be blank) Date Medicare IM given:  11/20/2013 Medicare IM given by:  GRAVES-BIGELOW,Tishawna Larouche Date Additional Medicare IM given:   Additional Medicare IM given by:    Discharge Disposition:  HOME/SELF CARE  Per UR Regulation:  Reviewed for med. necessity/level of care/duration of stay  If discussed at [place] of Stay Meetings, dates discussed:    Comments:  S/W [facility] @ [facility] # [phone]  DOFETILIDE : NOT ON FORMULARY  TIKOSYN  1.5 MCG  BID COVER- YES CO-PAY: $ 40.00 30 DAYS SUPPLY PRIOR APPROVAL - NO PHARMACY: [facility], [facility], [facility], [facility],    [facility]   TIKOSYN 0.25 MCG   AND 0.125 MCG CO-PAY- $40.00 30 DAYS SUPPLY  MAIL ORDER : 90 DAYS SUPPLY $100.00

## 2013-11-18 NOTE — Progress Notes (Addendum)
Patient Profile: 79 y.o. female with symptomatic atrial fibrillation, intolerant to amiodarone therapy secondary due to worsening pulmonary function, admitted 11/17/13 for Tikosyn loading.  Subjective: Currently w/o complaints, but had an episode of substernal chest tightness earlier this am.   Objective: Vital signs in last 24 hours: Temp:  [97.6 F (36.4 C)-98.5 F (36.9 C)] 97.6 F (36.4 C) (10/14 0730) Pulse Rate:  [62-82] 70 (10/14 0730) Resp:  [22] 22 (10/13 1949) BP: (105-127)/(48-92) 105/92 mmHg (10/14 0730) SpO2:  [94 %-100 %] 100 % (10/14 0730) Weight:  [152 lb 3.2 oz (69.037 kg)-158 lb (71.668 kg)] 158 lb (71.668 kg) (10/14 0604) Last BM Date: 11/15/13  Intake/Output from previous day: 10/13 0701 - 10/14 0700 In: 240 [P.O.:240] Out: -  Intake/Output this shift:    Medications Current Facility-Administered Medications  Medication Dose Route Frequency Provider Last Rate Last Dose  . 0.9 %  sodium chloride infusion  250 mL Intravenous PRN Thompson Grayer, MD      . albuterol (PROVENTIL) (2.5 MG/3ML) 0.083% nebulizer solution 2.5 mg  2.5 mg Nebulization Q6H PRN Thompson Grayer, MD      . ALPRAZolam Duanne Moron) tablet 0.25 mg  0.25 mg Oral PRN Thompson Grayer, MD   0.25 mg at 11/17/13 2020  . apixaban (ELIQUIS) tablet 5 mg  5 mg Oral BID Thompson Grayer, MD   5 mg at 11/17/13 2229  . diltiazem (CARDIZEM CD) 24 hr capsule 240 mg  240 mg Oral Daily Thompson Grayer, MD      . dofetilide [facility]) capsule 250 mcg  250 mcg Oral BID Thompson Grayer, MD   250 mcg at 11/17/13 2101  . furosemide (LASIX) tablet 40 mg  40 mg Oral Daily Thompson Grayer, MD   40 mg at 11/17/13 1848  . levothyroxine (SYNTHROID, LEVOTHROID) tablet 25 mcg  25 mcg Oral QAC breakfast Thompson Grayer, MD   25 mcg at 11/18/13 0756  . metoprolol tartrate (LOPRESSOR) tablet 25 mg  25 mg Oral BID Thompson Grayer, MD   25 mg at 11/17/13 2229  . pantoprazole (PROTONIX) EC tablet 40 mg  40 mg Oral Daily Thompson Grayer, MD   40 mg at 11/17/13  1849  . potassium chloride SA (K-DUR,KLOR-CON) CR tablet 20 mEq  20 mEq Oral Daily Thompson Grayer, MD   20 mEq at 11/17/13 1849  . sodium chloride 0.9 % injection 3 mL  3 mL Intravenous Q12H Thompson Grayer, MD      . sodium chloride 0.9 % injection 3 mL  3 mL Intravenous PRN Thompson Grayer, MD        PE: General appearance: alert, cooperative and no distress Neck: no JVD Lungs: clear to auscultation bilaterally Cardiac: irregularly irregular Extremities: no LEE Pulses: 2+ and symmetric Skin: warm and dry Neurologic: Grossly normal  Lab Results:  No results found for this basename: WBC, HGB, HCT, PLT,  in the last 72 hours BMET  Recent Labs  11/16/13 0955 11/17/13 0828 11/18/13 0400  NA 137 142 142  K 3.7 4.3 4.2  CL 98 106 103  CO2 28 26 28   GLUCOSE 85 82 93  BUN 15 18 16   CREATININE 1.2 1.1 1.19*  CALCIUM 9.5 9.2 9.2    Assessment/Plan  Active Problems:   Atrial fibrillation  1. Atrial Fibrillation: Being loaded with Tikosyn for rhythm control. Also on metoprolol and diltiazem. On Eliquis for stroke prophylaxis.   2. Tikosyn loading: 250 mcg BID initiated yesterday. Post EKG demonstrated QTc of 524. Patient also noted to  have intermittent episodes of Junctional Rhythm with HR in the 50s overnight. EKG today demonstrates QTc of 486. K is 4.2 and Mg is 1.8.   3. Chest Pain: Patient had an episode of SSCP earlier this am. EKG with more pronounced T wave abnormalities. Currently CP free.   MD to follow with further recommendations.    LOS: 1 day    Brittainy M. Rosita Fire, PA-C 11/18/2013 8:36 AM  Have reviewed tracings with staff and with pt  QTC right aobut 520 with QRSd about 130 (V1)  Will continue current dose with borderline GFR

## 2013-11-18 NOTE — Progress Notes (Signed)
Pharmacy Consult for Dofetilide (Tikosyn) Initiation  Admit Complaint: 79 y.o. female admitted 11/17/2013 with atrial fibrillation to be initiated on dofetilide.   Assessment:  Patient Exclusion Criteria: If any screening criteria checked as "Yes", then  patient  should NOT receive dofetilide until criteria item is corrected. If "Yes" please indicate correction plan.  YES  NO Patient  Exclusion Criteria Correction Plan  []  [x]  Baseline QTc interval is greater than or equal to 440 msec. IF above YES box checked dofetilide contraindicated unless patient has ICD; then may proceed if QTc 500-550 msec or with known ventricular conduction abnormalities may proceed with QTc 550-600 msec. QTc = 426   []  [x]  Magnesium level is less than 1.8 mEq/l : Last magnesium:   Mg 2.1 prior to initiation       []  [x]  Potassium level is less than 4 mEq/l : Last potassium:   K 4.3 prior to initiation         []  [x]  Patient is known or suspected to have a digoxin level greater than 2 ng/ml: No results found for this basename: DIGOXIN   Not on PTA   []  [x]  Creatinine clearance less than 20 ml/min (calculated using Cockcroft-Gault, actual body weight and serum creatinine): Estimated Creatinine Clearance: 40.2 ml/min (by C-G formula based on Cr of 1.19).    []  [x]  Patient has received drugs known to prolong the QT intervals within the last 48 hours(phenothiazines, tricyclics or tetracyclic antidepressants, erythromycin, H-1 antihistamines, cisapride, fluoroquinolones, azithromycin). Drugs not listed above may have an, as yet, undetected potential to prolong the QT interval, updated information on QT prolonging agents is available at this website:QT prolonging agents   []  [x]  Patient received a dose of hydrochlorothiazide (Oretic) alone or in any combination including triamterene (Dyazide, Maxzide) in the last 48 hours.   []  [x]  Patient received a medication known to increase dofetilide plasma concentrations  prior to initial dofetilide dose:    Trimethoprim (Primsol, Proloprim) in the last 36 hours   Verapamil (Calan, Verelan) in the last 36 hours or a sustained release dose in the last 72 hours   Megestrol (Megace) in the last 5 days    Cimetidine (Tagamet) in the last 6 hours   Ketoconazole (Nizoral) in the last 24 hours   Itraconazole (Sporanox) in the last 48 hours    Prochlorperazine (Compazine) in the last 36 hours    []  [x]  Patient is known to have a history of torsades de pointes; congenital or acquired long QT syndromes.   []  [x]  Patient has received a Class 1 antiarrhythmic with less than 2 half-lives since last dose. (Disopyramide, Quinidine, Procainamide, Lidocaine, Mexiletine, Flecainide, Propafenone)   []  [x]  Patient has received amiodarone therapy in the past 3 months or amiodarone level is greater than 0.3 ng/ml.    Patient has been appropriately anticoagulated with Apixaban  Ordering provider was confirmed at [email or web address] if they are not listed on the [place] Prescribers list.  Goal of Therapy:  Follow renal function, electrolytes, potential drug interactions, and dose adjustment. Provide education and 1 week supply at discharge.  Plan:  1.  Initiate dofetilide based on renal function: [facility] Calculated CrCl  Dose q12h  []  > 60 ml/min 500 mcg  [x]  40-60 ml/min 250 mcg  []  20-40 ml/min 125 mcg   2. Follow up QTc after the first 5 doses, renal function, electrolytes (K & Mg) daily x 3     days, dose adjustment, success of initiation and facilitate 1  week discharge supply as     clinically indicated.  3. Initiate Tikosyn education video (Call [phone] and ask for video # [phone]).  4. Place Enrollment Form on the chart for discharge supply of dofetilide.   Lawson Radar 2:27 PM 11/18/2013

## 2013-11-19 DIAGNOSIS — I369 Nonrheumatic tricuspid valve disorder, unspecified: Secondary | ICD-10-CM

## 2013-11-19 LAB — BASIC METABOLIC PANEL
ANION GAP: 12 (ref 5–15)
BUN: 14 mg/dL (ref 6–23)
CHLORIDE: 100 meq/L (ref 96–112)
CO2: 28 mEq/L (ref 19–32)
CREATININE: 1.03 mg/dL (ref 0.50–1.10)
Calcium: 9.3 mg/dL (ref 8.4–10.5)
GFR, EST AFRICAN AMERICAN: 59 mL/min — AB (ref >90)
GFR, EST NON AFRICAN AMERICAN: 51 mL/min — AB (ref >90)
Glucose, Bld: 93 mg/dL (ref 70–99)
Potassium: 4.1 mEq/L (ref 3.7–5.3)
Sodium: 140 mEq/L (ref 137–147)

## 2013-11-19 LAB — MAGNESIUM: MAGNESIUM: 2.2 mg/dL (ref 1.5–2.5)

## 2013-11-19 MED ORDER — OXYCODONE HCL 5 MG PO TABS: 5.0000 mg | ORAL_TABLET | Freq: Four times a day (QID) | ORAL | Status: DC | PRN

## 2013-11-19 MED ORDER — DOFETILIDE 125 MCG PO CAPS
125.0000 ug | ORAL_CAPSULE | Freq: Two times a day (BID) | ORAL | Status: DC
  Administered 2013-11-19 – 2013-11-20 (×3): 125 ug via ORAL
  Filled 2013-11-19 (×7): qty 1

## 2013-11-19 MED ORDER — DILTIAZEM HCL ER COATED BEADS 240 MG PO CP24: 240.0000 mg | ORAL_CAPSULE | Freq: Every day | ORAL | Status: DC

## 2013-11-19 MED ORDER — POTASSIUM CHLORIDE CRYS ER 20 MEQ PO TBCR
40.0000 meq | EXTENDED_RELEASE_TABLET | Freq: Every day | ORAL | Status: DC
  Administered 2013-11-19 – 2013-11-20 (×2): 40 meq via ORAL
  Filled 2013-11-19 (×2): qty 2

## 2013-11-19 MED ORDER — FUROSEMIDE 80 MG PO TABS
80.0000 mg | ORAL_TABLET | Freq: Every day | ORAL | Status: DC
Start: 2013-11-20 — End: 2013-11-20
  Administered 2013-11-20: 80 mg via ORAL
  Filled 2013-11-19 (×2): qty 1

## 2013-11-19 MED ORDER — DILTIAZEM HCL ER COATED BEADS 240 MG PO CP24
240.0000 mg | ORAL_CAPSULE | Freq: Every day | ORAL | Status: DC
  Administered 2013-11-19: 240 mg via ORAL
  Filled 2013-11-19: qty 1

## 2013-11-19 MED ORDER — FUROSEMIDE 80 MG PO TABS
80.0000 mg | ORAL_TABLET | Freq: Two times a day (BID) | ORAL | Status: DC
  Administered 2013-11-19: 80 mg via ORAL
  Filled 2013-11-19 (×4): qty 1

## 2013-11-19 NOTE — Progress Notes (Signed)
  Echocardiogram 2D Echocardiogram has been performed.  Renee [name] 11/19/2013, 3:33 PM

## 2013-11-19 NOTE — Progress Notes (Signed)
       Patient Name: Renee [name]      SUBJECTIVE:* without complaints  Past Medical History  Diagnosis Date  . GERD (gastroesophageal reflux disease)   . IBS (irritable bowel syndrome)   . Bundle branch block, right   . Coronary artery disease     prior CABG in August of 2011  . Fatigue   . Joint pain   . Anxiety   . AF (atrial fibrillation)     Post op after CABG; off amiodarone  . HTN (hypertension)   . CHF (congestive heart failure)   . Dysphagia   . Colon polyps   . Hypertension   . Hypercholesteremia   . Normal nuclear stress test October 2012  . Hypothyroidism   . Palpitations   . PONV (postoperative nausea and vomiting)   . Family history of anesthesia complication     "PONV"  . Myocardial infarction 2011  . Chronic bronchitis     "get it q yr" (11/17/2013)  . Emphysema/COPD   . Pneumonia     "I've had a touch" 11/17/2013)  . Osteoarthritis of right hip   . Sciatic nerve pain     Scheduled Meds:  Scheduled Meds: . apixaban  5 mg Oral BID  . diltiazem  240 mg Oral Daily  . dofetilide  125 mcg Oral BID  . furosemide  40 mg Oral Daily  . levothyroxine  25 mcg Oral QAC breakfast  . metoprolol tartrate  25 mg Oral BID  . pantoprazole  40 mg Oral Daily  . potassium chloride SA  20 mEq Oral Daily  . sodium chloride  3 mL Intravenous Q12H   Continuous Infusions:  sodium chloride, acetaminophen, albuterol, ALPRAZolam, sodium chloride    PHYSICAL EXAM Filed Vitals:   11/18/13 1014 11/18/13 1805 11/18/13 2118 11/19/13 0448  BP: 133/97 114/45 112/53 117/69  Pulse: 74 69 67 59  Temp:  98.3 F (36.8 C) 98.3 F (36.8 C)   TempSrc:  Oral Oral   Resp:  20    Height:      Weight:    156 lb 14.4 oz (71.169 kg)  SpO2:  98% 99% 100%    Well developed and nourished in no acute distress earing O2 HENT normal Neck supple with JVP-flat Clear deecreasedd Regular rate and rhythm, no murmurs or gallops Abd-soft with active BS No Clubbing cyanosis  edema Skin-warm and dry A & Oriented  Grossly normal sensory and motor function   TELEMETRY: Reviewed telemetry pt in nsr:   No intake or output data in the 24 hours ending 11/19/13 0731  LABS: Basic Metabolic Panel:  Recent Labs Lab 11/16/13 0955 11/17/13 0828 11/18/13 0400 11/19/13 0555  NA 137 142 142 140  K 3.7 4.3 4.2 4.1  CL 98 106 103 100  CO2 28 26 28 28   GLUCOSE 85 82 93 93  BUN 15 18 16 14   CREATININE 1.2 1.1 1.19* 1.03  CALCIUM 9.5 9.2 9.2 9.3  MG 2.2 2.1 2.1 2.2   ECG  QT now considerably longee than yesterdya with QTc about 530  Even with QRSd midly prolonged, will decrease tikosyn  ( GFR also better nwo again over 50)  ASSESSMENT AND PLAN:  Active Problems:   Atrial fibrillation  Increase lasix 40>>80  Bid>>daily  Maintain K at 40 home in am  Signed, Virl Axe MD  11/19/2013

## 2013-11-19 NOTE — Progress Notes (Signed)
Patient has concerns about her medication.Patient wants to know why she only taking her lasix and potassium medication once a day when she takes it twice a day at home.Patient also concerned whether or not she should take tonight dose of tikosyn.Patient concerned potassium level could possibly be low.Potassium level normal at 4.2 earlier today.Call placed to Dr.Whitlock.Dr.Whitlock said patient she be find taking tikosyn tonight and she she talk to Dr.Klein in morning with regards to frequency of lasix and potassium medication.

## 2013-11-20 ENCOUNTER — Telehealth: Payer: Self-pay | Admitting: Internal Medicine

## 2013-11-20 ENCOUNTER — Other Ambulatory Visit: Payer: Self-pay | Admitting: Physician Assistant

## 2013-11-20 DIAGNOSIS — I5032 Chronic diastolic (congestive) heart failure: Secondary | ICD-10-CM

## 2013-11-20 LAB — BASIC METABOLIC PANEL
Anion gap: 11 (ref 5–15)
BUN: 13 mg/dL (ref 6–23)
CO2: 29 mEq/L (ref 19–32)
CREATININE: 1.12 mg/dL — AB (ref 0.50–1.10)
Calcium: 9.1 mg/dL (ref 8.4–10.5)
Chloride: 100 mEq/L (ref 96–112)
GFR calc Af Amer: 53 mL/min — ABNORMAL LOW (ref >90)
GFR, EST NON AFRICAN AMERICAN: 46 mL/min — AB (ref >90)
Glucose, Bld: 99 mg/dL (ref 70–99)
POTASSIUM: 4.3 meq/L (ref 3.7–5.3)
Sodium: 140 mEq/L (ref 137–147)

## 2013-11-20 LAB — MAGNESIUM: Magnesium: 2.2 mg/dL (ref 1.5–2.5)

## 2013-11-20 MED ORDER — FUROSEMIDE 80 MG PO TABS: 80.0000 mg | ORAL_TABLET | Freq: Every day | ORAL | Status: DC

## 2013-11-20 MED ORDER — POTASSIUM CHLORIDE CRYS ER 20 MEQ PO TBCR: 40.0000 meq | EXTENDED_RELEASE_TABLET | Freq: Every day | ORAL | Status: DC

## 2013-11-20 MED ORDER — DOFETILIDE 125 MCG PO CAPS: 125.0000 ug | ORAL_CAPSULE | Freq: Two times a day (BID) | ORAL | Status: DC

## 2013-11-20 NOTE — Progress Notes (Signed)
Patient Name: Renee [name]      SUBJECTIVE:* without complaints Urinated well during the day on 80 and slept better Past Medical History  Diagnosis Date  . GERD (gastroesophageal reflux disease)   . IBS (irritable bowel syndrome)   . Bundle branch block, right   . Coronary artery disease     prior CABG in August of 2011  . Fatigue   . Joint pain   . Anxiety   . AF (atrial fibrillation)     Post op after CABG; off amiodarone  . HTN (hypertension)   . CHF (congestive heart failure)   . Dysphagia   . Colon polyps   . Hypertension   . Hypercholesteremia   . Normal nuclear stress test October 2012  . Hypothyroidism   . Palpitations   . PONV (postoperative nausea and vomiting)   . Family history of anesthesia complication     "PONV"  . Myocardial infarction 2011  . Chronic bronchitis     "get it q yr" (11/17/2013)  . Emphysema/COPD   . Pneumonia     "I've had a touch" 11/17/2013)  . Osteoarthritis of right hip   . Sciatic nerve pain     Scheduled Meds:  Scheduled Meds: . apixaban  5 mg Oral BID  . diltiazem  240 mg Oral Daily  . dofetilide  125 mcg Oral BID  . furosemide  80 mg Oral Daily  . levothyroxine  25 mcg Oral QAC breakfast  . metoprolol tartrate  25 mg Oral BID  . pantoprazole  40 mg Oral Daily  . potassium chloride SA  40 mEq Oral Daily  . sodium chloride  3 mL Intravenous Q12H   Continuous Infusions:  sodium chloride, acetaminophen, albuterol, ALPRAZolam, sodium chloride    PHYSICAL EXAM Filed Vitals:   11/19/13 1423 11/19/13 1823 11/19/13 2131 11/20/13 0513  BP: 114/58 125/43 106/54 113/50  Pulse: 67  72 64  Temp: 98 F (36.7 C)  98.4 F (36.9 C) 97.8 F (36.6 C)  TempSrc: Oral  Oral Oral  Resp: 20     Height:      Weight:    156 lb 14.4 oz (71.169 kg)  SpO2: 97%  94% 94%    Well developed and nourished in no acute distress earing O2 HENT normal Neck supple with JVP-flat Clear deecreasedd Regular rate and rhythm, no  murmurs or gallops Abd-soft with active BS No Clubbing cyanosis edema Skin-warm and dry A & Oriented  Grossly normal sensory and motor function   TELEMETRY: Reviewed telemetry pt in nsr:    Intake/Output Summary (Last 24 hours) at 11/20/13 0824 Last data filed at 11/19/13 1800  Gross per 24 hour  Intake   1080 ml  Output      0 ml  Net   1080 ml    LABS: Basic Metabolic Panel:  Recent Labs Lab 11/16/13 0955 11/17/13 0828 11/18/13 0400 11/19/13 0555 11/20/13 0330  NA 137 142 142 140 140  K 3.7 4.3 4.2 4.1 4.3  CL 98 106 103 100 100  CO2 28 26 28 28 29   GLUCOSE 85 82 93 93 99  BUN 15 18 16 14 13   CREATININE 1.2 1.1 1.19* 1.03 1.12*  CALCIUM 9.5 9.2 9.2 9.3 9.1  MG 2.2 2.1 2.1 2.2 2.2   ECG  QT now considerably longee than yesterdya with QTc about 530  Even with QRSd midly prolonged, will decrease tikosyn  ( GFR also better nwo  again over 50)  ASSESSMENT AND PLAN:  Active Problems:   Atrial fibrillation  Increase lasix 40>>80  Bid>>daily  Maintain K at 40 home today Lasix 80 qd K 40  tikosyn followup next week with bmet QTc 531 but within context of IVCD  Can continue   Signed, Virl Axe MD  11/20/2013

## 2013-11-20 NOTE — Progress Notes (Signed)
Dr. Elias Else notified that pt's QTc on EKG post pm tikosyn dose was 514.  Received verbal order to HOLD am dose until pt further evaluated by cardiology.  Will continue to monitor pt.

## 2013-11-20 NOTE — Discharge Instructions (Signed)

## 2013-11-20 NOTE — Telephone Encounter (Signed)
New problem   Per Renee [name] calling, pt is seeing Renee [name] on 12/11/13 but Dr Caryl Comes isn't in the office. Renee [name] wanted Dr Caryl Comes to make sure this was all right or did he wanted to see the pt, if so please call pt with another appt per [facility].

## 2013-11-20 NOTE — Discharge Summary (Signed)
Discharge Summary   Patient ID: Renee [name],  MRN: [id number], DOB/AGE: 1934/12/07 79 y.o.  Admit date: 11/17/2013 Discharge date: 11/20/2013  Primary Care Provider: [name] Primary Cardiologist: [place]  Electrophysiologist: Caryl Comes   Discharge Diagnoses Principal Problem:   Atrial fibrillation: loaded with tikosyn during this admission, started with 250mg  BID, discharged on 125mg  BID Active Problems:   COPD (chronic obstructive pulmonary disease)   GERD   Bundle branch block, right   Coronary artery disease   Hypothyroidism   Allergies Allergies  Allergen Reactions  . Spiriva [Tiotropium Bromide Monohydrate] Other (See Comments)    Throat irritation  . Statins     Legs hurt  . Levofloxacin Nausea Only    Unable to tolerate more than 7days  . Budesonide-Formoterol Fumarate     Tachycardia  . Morphine     REACTION: double vision  . Penicillins     REACTION: swelling/rash  . Phenazopyridine Hcl     *piridium* REACTION: face red  . Pneumococcal Vaccine     REACTION: rash and cough  . Pneumococcal Vaccines Swelling    Redding of the skin  . Prednisone     REACTION: rash  . Prednisone Swelling  . Sulfonamide Derivatives     REACTION: rash  . Tylenol [Acetaminophen]     Tylenol #3 -- rash and swelling - takes plain Tylenol ok  . Penicillins Swelling and Rash  . Sulfa Antibiotics Rash    Procedures  Echocardiogram 11/19/2013 LV EF: 50% - 55%  ------------------------------------------------------------------- Indications: Atrial fibrillation - 427.31.  ------------------------------------------------------------------- History: PMH: Coronary artery disease. Congestive heart failure.  ------------------------------------------------------------------- Study Conclusions  - Left ventricle: The cavity size was normal. Systolic function was normal. The estimated ejection fraction was in the range of 50% to 55%. Wall motion was normal; there were  no regional wall motion abnormalities. Doppler parameters are consistent with abnormal left ventricular relaxation (grade 1 diastolic dysfunction). There was no evidence of elevated ventricular filling pressure by Doppler parameters. - Aortic valve: Trileaflet; normal thickness leaflets. There was no regurgitation. - Aortic root: The aortic root was normal in size. - Mitral valve: There was no regurgitation. - Left atrium: The atrium was normal in size. - Right ventricle: Systolic function was normal. - Right atrium: The atrium was normal in size. - Tricuspid valve: There was mild regurgitation. - Pulmonary arteries: Systolic pressure was within the normal range. - Pericardium, extracardiac: There was no pericardial effusion.  Impressions:  - There appears to be dyssynchrony in left ventricular conduction. Overal LVEF is low normal, estimated at 50%. Abnormal relaxation with normal filling pressures. Normal right ventricular size and function. Mild tricuspid regurgitation. Normal RVSP.       Hospital Course  The patient is a 79 year old female with past medical history of atrial fibrillation, COPD, CAD and hypothyroidism who was admitted to [facility] on 11/17/2013 for Tikosyn loading. According to the office note, patient has been having increasing episodes of atrial fibrillation. She wore a event monitor in October 2014 which demonstrated atrial fibrillation with RVR with ventricular rates up to 190 bpm. She is on both diltiazem and metoprolol for rate control. She continued to have atrial fibrillation with RVR despite this and was referred to Dr. Caryl Comes for evaluation and Tikosyn was recommended. Of note, she is symptomatic with her atrial fibrillation with increased shortness breath, lightheadedness, and chest discomfort. She was previously on amiodarone but however was intolerant due to worsening pulmonary function. Her creatinine clearance on arrival was 47,  and therefore  we initiated Tikosyn at 250 mcg BID while continue on Eliquis. Echocardiogram was obtained on 11/19/2013 which showed EF 23-76%, grade 1 diastolic dysfunction, mild TR, normal RVSP. Her PO Lasix was temporarily increased during this admission to 80 mg twice a day, however quickly titrated down to 80 mg daily.  She was seen the morning of 11/20/2013, QTC on EKG was 530 which is mildly prolonged, her tikosyn dose was decreased to 155mcg BID. She is deemed stable at discharge from EP perspective. She will need BMET followup next week in our office with Pharm D after tikosyn loading, after which she will follow up with Dr. Caryl Comes. I have given the patient 14 tablet bottle of Tykosyn on discharge along with 4 month prescription. She will need close follow up after discharge while on Tikosyn. EKG should be obtained on follow up to check QTc interval.   Discharge Vitals Blood pressure 113/50, pulse 64, temperature 97.8 F (36.6 C), temperature source Oral, resp. rate 20, height 5\' 6"  (1.676 m), weight 156 lb 14.4 oz (71.169 kg), SpO2 94.00%.  Filed Weights   11/18/13 0604 11/19/13 0448 11/20/13 0513  Weight: 158 lb (71.668 kg) 156 lb 14.4 oz (71.169 kg) 156 lb 14.4 oz (71.169 kg)    Labs  Basic Metabolic Panel  Recent Labs  11/19/13 0555 11/20/13 0330  NA 140 140  K 4.1 4.3  CL 100 100  CO2 28 29  GLUCOSE 93 99  BUN 14 13  CREATININE 1.03 1.12*  CALCIUM 9.3 9.1  MG 2.2 2.2    Disposition  Pt is being discharged home today in good condition.  Follow-up Plans & Appointments      Follow-up Information   Follow up with [facility] On 11/27/2013. (11:00am See Elita Boone D after tikosyn loading. Obtain BMET on same day. )    Specialty:  Cardiology   Contact information:   [address], [place] [address] [phone]      Follow up with Richardson Dopp, PA-C On 12/11/2013. (10:10am. Unfortuntately Dr. Caryl Comes is not in the office on the same day,  will check with office if there is a better appt time.)    Specialty:  Physician Assistant   Contact information:   [address] [place] [address] [phone]       Discharge Medications    Medication List         Aclidinium Bromide 400 MCG/ACT Aepb  Commonly known as:  TUDORZA PRESSAIR  Inhale 1 puff into the lungs 2 (two) times daily.     ALPRAZolam 0.25 MG tablet  Commonly known as:  XANAX  Take 1 tablet by mouth as needed for anxiety or sleep.     apixaban 5 MG Tabs tablet  Commonly known as:  ELIQUIS  Take 1 tablet (5 mg total) by mouth 2 (two) times daily.     diltiazem 240 MG 24 hr capsule  Commonly known as:  CARDIZEM CD  Take 1 capsule (240 mg total) by mouth daily.     dofetilide 125 MCG capsule  Commonly known as:  TIKOSYN  Take 1 capsule (125 mcg total) by mouth 2 (two) times daily.     esomeprazole 40 MG capsule  Commonly known as:  NEXIUM  Take 40 mg by mouth daily before breakfast.     furosemide 80 MG tablet  Commonly known as:  LASIX  Take 1 tablet (80 mg total) by mouth  daily.     levalbuterol 45 MCG/ACT inhaler  Commonly known as:  XOPENEX HFA  Inhale 2 puffs into the lungs every 6 (six) hours as needed for wheezing.     levothyroxine 25 MCG tablet  Commonly known as:  SYNTHROID, LEVOTHROID  Take 25 mcg by mouth daily before breakfast.     metoprolol tartrate 25 MG tablet  Commonly known as:  LOPRESSOR  Take 25 mg by mouth 2 (two) times daily. 830 am, 730 pm     nitroGLYCERIN 0.4 MG SL tablet  Commonly known as:  NITROSTAT  PLACE 1 TABLET UNDER THE TONGUE EVERY 5 MINUTES AS NEEDED FOR CHEST PAIN     NON FORMULARY  as needed. Oxygen - 3-4L pulse with extertion, 2L continuious QHS.     potassium chloride SA 20 MEQ tablet  Commonly known as:  K-DUR,KLOR-CON  Take 2 tablets (40 mEq total) by mouth daily.     SYSTANE OP  Place 1 drop into both eyes daily.     traMADol 50 MG tablet  Commonly known as:  ULTRAM    Take 1 tablet (50 mg total) by mouth every 6 (six) hours as needed. Pain     VITAMIN D (CHOLECALCIFEROL) PO  Take 1,000 Units by mouth daily.        Outstanding Labs/Studies  BMET in 1 week and see office pharmacist after tikosyn loading  Duration of Discharge Encounter   Greater than 30 minutes including physician time.  Hilbert Corrigan PA-C Pager: [phone] 11/20/2013, 10:57 AM

## 2013-11-20 NOTE — Progress Notes (Signed)
Pt discharged to home, condition stable, left facility via W/C accompanied by spouse.

## 2013-11-20 NOTE — Clinical Documentation Improvement (Signed)
 .   Document acuity:  Please specify acuity/clinical condition of patients use for O2 dependence at home:  --Chronic --Acute on chronic  . Document inclusion of: --Hypoxia --Hypercapnia  Risk Factors: A. Fib  Former Use Quit date: 09/21/2009  History of Emphysema/COPD   Supporting Information: "medical history significant for paroxysmal atrial fibrillation, oxygen dependent COPD... She has chronic dyspnea on exertion. Lungs- prolonged expiratory phase  Home meds: levalbuterol (XOPENEX HFA) 45 MCG/ACT inhaler  Inhale 2 puffs into the lungs every 6 (six) hours as needed for wheezing.  NON FORMULARY as needed. Oxygen - 3L pulse with extertion, 2L continuious QHS.   Facility-Administered Medications  albuterol (PROVENTIL) (2.5 MG/3ML) 0.083% nebulizer solution 2.5 mg 2.5 mg Nebulization Q6H PRN  Oxygen  2L/[place]   NON FORMULARY as needed. Oxygen - 3L pulse with extertion, 2L continuious QHS.  During admission  O2@ 2L/[place] with sats 98-100%   Thank You,  Melvia Heaps, RN, [place] [facility] DEPT. [phone]

## 2013-11-24 ENCOUNTER — Ambulatory Visit (INDEPENDENT_AMBULATORY_CARE_PROVIDER_SITE_OTHER): Payer: Medicare Other | Admitting: Pharmacist

## 2013-11-24 DIAGNOSIS — I4891 Unspecified atrial fibrillation: Secondary | ICD-10-CM

## 2013-11-24 DIAGNOSIS — I2510 Atherosclerotic heart disease of native coronary artery without angina pectoris: Secondary | ICD-10-CM

## 2013-11-25 NOTE — Telephone Encounter (Signed)
[place] for patient to be seen by Richardson Dopp. Appointment is for post Tikosyn loading at hospital.

## 2013-11-26 ENCOUNTER — Ambulatory Visit (INDEPENDENT_AMBULATORY_CARE_PROVIDER_SITE_OTHER): Payer: Medicare Other | Admitting: Adult Health

## 2013-11-26 ENCOUNTER — Encounter: Payer: Self-pay | Admitting: Adult Health

## 2013-11-26 ENCOUNTER — Ambulatory Visit (INDEPENDENT_AMBULATORY_CARE_PROVIDER_SITE_OTHER)
Admission: RE | Admit: 2013-11-26 | Discharge: 2013-11-26 | Disposition: A | Discharge status: Home / Self Care | Payer: Medicare Other | Source: Ambulatory Visit | Attending: Adult Health | Admitting: Adult Health

## 2013-11-26 VITALS — BP 106/64 | HR 66 | Temp 96.9°F

## 2013-11-26 DIAGNOSIS — J441 Chronic obstructive pulmonary disease with (acute) exacerbation: Secondary | ICD-10-CM

## 2013-11-26 DIAGNOSIS — I2510 Atherosclerotic heart disease of native coronary artery without angina pectoris: Secondary | ICD-10-CM | POA: Diagnosis not present

## 2013-11-26 DIAGNOSIS — R05 Cough: Secondary | ICD-10-CM | POA: Diagnosis not present

## 2013-11-26 MED ORDER — METHYLPREDNISOLONE ACETATE 80 MG/ML IJ SUSP
80.0000 mg | Freq: Once | INTRAMUSCULAR | Status: AC
  Administered 2013-11-26: 80 mg via INTRAMUSCULAR

## 2013-11-26 MED ORDER — DOXYCYCLINE HYCLATE 100 MG PO TABS: 100.0000 mg | ORAL_TABLET | Freq: Two times a day (BID) | ORAL | Status: DC

## 2013-11-26 NOTE — Addendum Note (Signed)
Addended by: Parke Poisson E on: 11/26/2013 05:00 PM   Modules accepted: Orders

## 2013-11-26 NOTE — Patient Instructions (Signed)
Doxycycline 100mg  Twice daily  For 7 days-take with food  Mucinex DM Twice daily  As needed  Cough/congestion  Chest xray today  Saline nasal rinses As needed   May use Tessalon Three times a day  As needed  Cough.  Follow up Dr. Joya Gaskins  In 6 weeks and As needed   Please contact office for sooner follow up if symptoms do not improve or worsen or seek emergency care

## 2013-11-26 NOTE — Progress Notes (Signed)
   Subjective:    Patient ID: Renee [name], female    DOB: May 18, 1934, 79 y.o.   MRN: [id number]  HPI 79 yo female with known hx of COPD    11/26/2013 Acute OV  Complains of prod cough with green mucus, some head congestion with runny nose w/ clear drainage, sinus pressure, PND, increased SOB, wheezing x1 week.  Had some symptoms 3 weeks ago, took zpack with some help but never went totally away.  Denies any tightness, fever, hemoptysis, chest pain ,  She remains on Tudorza.  Was admitted last week with Atrial Fib with Tikosyn initiation, since then with some intermittent dizziness. No headache, visual /speech changes, no arm weakness.  .     Review of Systems Constitutional:   No  weight loss, night sweats,  Fevers, chills,  +fatigue, or  lassitude.  HEENT:   No headaches,  Difficulty swallowing,  Tooth/dental problems, or  Sore throat,                No sneezing, itching, ear ache,  +nasal congestion, post nasal drip,   CV:  No chest pain,  Orthopnea, PND, swelling in lower extremities, anasarca, dizziness, palpitations, syncope.   GI  No heartburn, indigestion, abdominal pain, nausea, vomiting, diarrhea, change in bowel habits, loss of appetite, bloody stools.   Resp:  .  No chest wall deformity  Skin: no rash or lesions.  GU: no dysuria, change in color of urine, no urgency or frequency.  No flank pain, no hematuria   MS:  No joint pain or swelling.  No decreased range of motion.  No back pain.  Psych:  No change in mood or affect. No depression or anxiety.  No memory loss.         Objective:   Physical Exam GEN: A/Ox3; pleasant , NAD, elderly   HEENT:  [place]/AT,  EACs-clear, TMs-wnl, NOSE-clear drainage  THROAT-clear, no lesions, no postnasal drip or exudate noted.   NECK:  Supple w/ fair ROM; no JVD; normal carotid impulses w/o bruits; no thyromegaly or nodules palpated; no lymphadenopathy.  RESP  Decreased BS in bases , no accessory muscle use, no dullness  to percussion  CARD:  RRR, no m/r/g  , no peripheral edema, pulses intact, no cyanosis or clubbing.  GI:   Soft & nt; nml bowel sounds; no organomegaly or masses detected.  Musco: Warm bil, no deformities or joint swelling noted.   Neuro: alert, no focal deficits noted.    Skin: Warm, no lesions or rashes         Assessment & Plan:

## 2013-11-26 NOTE — Assessment & Plan Note (Addendum)
Flare with URI  Check cxr (recent hospitalization and recent abx )   Has multiple drug allergies and intolerances   Plan  Doxycycline 100mg  Twice daily  For 7 days-take with food  Mucinex DM Twice daily  As needed  Cough/congestion  Depo Medrol 80mg  IM x 1 (can not tolerate oral steroids )  Chest xray today  Saline nasal rinses As needed   May use Tessalon Three times a day  As needed  Cough.  Follow up Dr. Joya Gaskins  In 6 weeks and As needed   Please contact office for sooner follow up if symptoms do not improve or worsen or seek emergency care

## 2013-11-27 ENCOUNTER — Ambulatory Visit: Payer: Medicare Other | Admitting: Pharmacist

## 2013-11-27 ENCOUNTER — Other Ambulatory Visit (INDEPENDENT_AMBULATORY_CARE_PROVIDER_SITE_OTHER): Payer: Medicare Other | Admitting: *Deleted

## 2013-11-27 ENCOUNTER — Ambulatory Visit (INDEPENDENT_AMBULATORY_CARE_PROVIDER_SITE_OTHER): Payer: Medicare Other | Admitting: Pharmacist

## 2013-11-27 ENCOUNTER — Other Ambulatory Visit: Payer: Medicare Other

## 2013-11-27 VITALS — BP 116/79 | HR 78

## 2013-11-27 DIAGNOSIS — I2510 Atherosclerotic heart disease of native coronary artery without angina pectoris: Secondary | ICD-10-CM

## 2013-11-27 DIAGNOSIS — I4891 Unspecified atrial fibrillation: Secondary | ICD-10-CM | POA: Diagnosis not present

## 2013-11-27 DIAGNOSIS — I5032 Chronic diastolic (congestive) heart failure: Secondary | ICD-10-CM | POA: Diagnosis not present

## 2013-11-27 LAB — BASIC METABOLIC PANEL
BUN: 13 mg/dL (ref 6–23)
CO2: 25 mEq/L (ref 19–32)
CREATININE: 1.3 mg/dL — AB (ref 0.4–1.2)
Calcium: 9.2 mg/dL (ref 8.4–10.5)
Chloride: 99 mEq/L (ref 96–112)
GFR: 42.58 mL/min — AB (ref >60.00)
GLUCOSE: 92 mg/dL (ref 70–99)
Potassium: 3.8 mEq/L (ref 3.5–5.1)
Sodium: 137 mEq/L (ref 135–145)

## 2013-11-27 LAB — MAGNESIUM: MAGNESIUM: 2.1 mg/dL (ref 1.5–2.5)

## 2013-11-27 MED ORDER — FUROSEMIDE 80 MG PO TABS: 40.0000 mg | ORAL_TABLET | Freq: Every day | ORAL | Status: DC

## 2013-11-27 NOTE — Patient Instructions (Signed)
Stop metoprolol  Decrease furosemide to 1/2 tablet (40mg ) daily   Take your diltiazem at night  Keep your appt with Richardson Dopp in 2 weeks to follow up blood pressure control and dizziness.

## 2013-11-30 ENCOUNTER — Telehealth: Payer: Self-pay | Admitting: Internal Medicine

## 2013-11-30 ENCOUNTER — Emergency Department (HOSPITAL_COMMUNITY): Payer: Medicare Other

## 2013-11-30 ENCOUNTER — Observation Stay (HOSPITAL_COMMUNITY)
Admission: EM | Admit: 2013-11-30 | Discharge: 2013-12-02 | Disposition: A | Discharge status: Home / Self Care | Payer: Medicare Other | Attending: Internal Medicine | Admitting: Internal Medicine

## 2013-11-30 ENCOUNTER — Encounter (HOSPITAL_COMMUNITY): Payer: Self-pay | Admitting: Emergency Medicine

## 2013-11-30 ENCOUNTER — Other Ambulatory Visit: Payer: Self-pay

## 2013-11-30 DIAGNOSIS — R42 Dizziness and giddiness: Secondary | ICD-10-CM | POA: Diagnosis not present

## 2013-11-30 DIAGNOSIS — I4510 Unspecified right bundle-branch block: Secondary | ICD-10-CM | POA: Diagnosis not present

## 2013-11-30 DIAGNOSIS — I10 Essential (primary) hypertension: Secondary | ICD-10-CM | POA: Diagnosis present

## 2013-11-30 DIAGNOSIS — J9811 Atelectasis: Secondary | ICD-10-CM | POA: Diagnosis not present

## 2013-11-30 DIAGNOSIS — I480 Paroxysmal atrial fibrillation: Secondary | ICD-10-CM | POA: Insufficient documentation

## 2013-11-30 DIAGNOSIS — J449 Chronic obstructive pulmonary disease, unspecified: Secondary | ICD-10-CM | POA: Diagnosis present

## 2013-11-30 DIAGNOSIS — E785 Hyperlipidemia, unspecified: Secondary | ICD-10-CM | POA: Diagnosis present

## 2013-11-30 DIAGNOSIS — I2510 Atherosclerotic heart disease of native coronary artery without angina pectoris: Secondary | ICD-10-CM | POA: Diagnosis not present

## 2013-11-30 DIAGNOSIS — R079 Chest pain, unspecified: Principal | ICD-10-CM | POA: Insufficient documentation

## 2013-11-30 DIAGNOSIS — I5032 Chronic diastolic (congestive) heart failure: Secondary | ICD-10-CM | POA: Diagnosis present

## 2013-11-30 HISTORY — DX: Hyperlipidemia, unspecified: E78.5

## 2013-11-30 HISTORY — DX: Chronic diastolic (congestive) heart failure: I50.32

## 2013-11-30 LAB — PRO B NATRIURETIC PEPTIDE: Pro B Natriuretic peptide (BNP): 250.9 pg/mL (ref 0–450)

## 2013-11-30 LAB — CBC
HCT: 46 % (ref 36.0–46.0)
Hemoglobin: 14.9 g/dL (ref 12.0–15.0)
MCH: 29.2 pg (ref 26.0–34.0)
MCHC: 32.4 g/dL (ref 30.0–36.0)
MCV: 90.2 fL (ref 78.0–100.0)
Platelets: 381 10*3/uL (ref 150–400)
RBC: 5.1 MIL/uL (ref 3.87–5.11)
RDW: 14.3 % (ref 11.5–15.5)
WBC: 7.4 10*3/uL (ref 4.0–10.5)

## 2013-11-30 LAB — BASIC METABOLIC PANEL
Anion gap: 19 — ABNORMAL HIGH (ref 5–15)
BUN: 17 mg/dL (ref 6–23)
CALCIUM: 9.9 mg/dL (ref 8.4–10.5)
CO2: 27 mEq/L (ref 19–32)
CREATININE: 1.12 mg/dL — AB (ref 0.50–1.10)
Chloride: 97 mEq/L (ref 96–112)
GFR, EST AFRICAN AMERICAN: 53 mL/min — AB (ref >90)
GFR, EST NON AFRICAN AMERICAN: 46 mL/min — AB (ref >90)
Glucose, Bld: 115 mg/dL — ABNORMAL HIGH (ref 70–99)
POTASSIUM: 3.2 meq/L — AB (ref 3.7–5.3)
Sodium: 143 mEq/L (ref 137–147)

## 2013-11-30 LAB — I-STAT TROPONIN, ED: Troponin i, poc: 0 ng/mL (ref 0.00–0.08)

## 2013-11-30 LAB — MAGNESIUM: Magnesium: 1.8 mg/dL (ref 1.5–2.5)

## 2013-11-30 MED ORDER — POTASSIUM CHLORIDE CRYS ER 20 MEQ PO TBCR
40.0000 meq | EXTENDED_RELEASE_TABLET | Freq: Three times a day (TID) | ORAL | Status: DC
  Administered 2013-11-30 – 2013-12-01 (×3): 40 meq via ORAL
  Filled 2013-11-30 (×4): qty 2

## 2013-11-30 MED ORDER — METOPROLOL TARTRATE 12.5 MG HALF TABLET
12.5000 mg | ORAL_TABLET | Freq: Two times a day (BID) | ORAL | Status: DC
  Administered 2013-11-30: 12.5 mg via ORAL
  Filled 2013-11-30: qty 1

## 2013-11-30 MED ORDER — LEVOTHYROXINE SODIUM 25 MCG PO TABS
25.0000 ug | ORAL_TABLET | Freq: Every day | ORAL | Status: DC
  Administered 2013-12-01 – 2013-12-02 (×2): 25 ug via ORAL
  Filled 2013-11-30 (×2): qty 1

## 2013-11-30 MED ORDER — APIXABAN 5 MG PO TABS
5.0000 mg | ORAL_TABLET | Freq: Two times a day (BID) | ORAL | Status: DC
  Administered 2013-11-30 – 2013-12-02 (×4): 5 mg via ORAL
  Filled 2013-11-30 (×4): qty 1

## 2013-11-30 MED ORDER — ALPRAZOLAM 0.25 MG PO TABS
0.2500 mg | ORAL_TABLET | Freq: Two times a day (BID) | ORAL | Status: DC | PRN
  Administered 2013-11-30 – 2013-12-01 (×3): 0.25 mg via ORAL
  Filled 2013-11-30 (×3): qty 1

## 2013-11-30 MED ORDER — ALBUTEROL SULFATE (2.5 MG/3ML) 0.083% IN NEBU: 2.5000 mg | INHALATION_SOLUTION | Freq: Four times a day (QID) | RESPIRATORY_TRACT | Status: DC | PRN

## 2013-11-30 MED ORDER — TRAMADOL HCL 50 MG PO TABS: 50.0000 mg | ORAL_TABLET | Freq: Four times a day (QID) | ORAL | Status: DC | PRN

## 2013-11-30 MED ORDER — ASPIRIN EC 81 MG PO TBEC
81.0000 mg | DELAYED_RELEASE_TABLET | Freq: Every day | ORAL | Status: DC
  Filled 2013-11-30: qty 1

## 2013-11-30 MED ORDER — NITROGLYCERIN 0.4 MG SL SUBL: 0.4000 mg | SUBLINGUAL_TABLET | SUBLINGUAL | Status: DC | PRN

## 2013-11-30 MED ORDER — DILTIAZEM HCL ER COATED BEADS 240 MG PO CP24
240.0000 mg | ORAL_CAPSULE | Freq: Every day | ORAL | Status: DC
  Administered 2013-12-01 – 2013-12-02 (×2): 240 mg via ORAL
  Filled 2013-11-30 (×2): qty 1

## 2013-11-30 MED ORDER — LEVALBUTEROL TARTRATE 45 MCG/ACT IN AERO: 2.0000 | INHALATION_SPRAY | Freq: Four times a day (QID) | RESPIRATORY_TRACT | Status: DC | PRN

## 2013-11-30 MED ORDER — PANTOPRAZOLE SODIUM 40 MG PO TBEC
40.0000 mg | DELAYED_RELEASE_TABLET | Freq: Every day | ORAL | Status: DC
  Administered 2013-12-01 – 2013-12-02 (×2): 40 mg via ORAL
  Filled 2013-11-30 (×2): qty 1

## 2013-11-30 MED ORDER — ASPIRIN 300 MG RE SUPP: 300.0000 mg | RECTAL | Status: AC

## 2013-11-30 MED ORDER — FUROSEMIDE 40 MG PO TABS: 40.0000 mg | ORAL_TABLET | Freq: Every day | ORAL | Status: DC

## 2013-11-30 MED ORDER — POTASSIUM CHLORIDE 20 MEQ/15ML (10%) PO LIQD
40.0000 meq | Freq: Once | ORAL | Status: AC
  Administered 2013-11-30: 40 meq via ORAL
  Filled 2013-11-30: qty 30

## 2013-11-30 MED ORDER — DOXYCYCLINE HYCLATE 100 MG PO TABS
100.0000 mg | ORAL_TABLET | Freq: Two times a day (BID) | ORAL | Status: DC
  Administered 2013-11-30 – 2013-12-02 (×4): 100 mg via ORAL
  Filled 2013-11-30 (×4): qty 1

## 2013-11-30 MED ORDER — DOFETILIDE 125 MCG PO CAPS
125.0000 ug | ORAL_CAPSULE | Freq: Two times a day (BID) | ORAL | Status: DC
  Administered 2013-11-30 – 2013-12-02 (×4): 125 ug via ORAL
  Filled 2013-11-30 (×4): qty 1

## 2013-11-30 MED ORDER — ASPIRIN 81 MG PO CHEW
324.0000 mg | CHEWABLE_TABLET | ORAL | Status: AC
Start: 2013-11-30 — End: 2013-11-30
  Administered 2013-11-30: 324 mg via ORAL
  Filled 2013-11-30: qty 4

## 2013-11-30 MED ORDER — ONDANSETRON HCL 4 MG/2ML IJ SOLN: 4.0000 mg | Freq: Four times a day (QID) | INTRAMUSCULAR | Status: DC | PRN

## 2013-11-30 MED ORDER — VITAMIN D 1000 UNITS PO TABS
1000.0000 [IU] | ORAL_TABLET | Freq: Every day | ORAL | Status: DC
  Administered 2013-12-01 – 2013-12-02 (×2): 1000 [IU] via ORAL
  Filled 2013-11-30 (×2): qty 1

## 2013-11-30 NOTE — ED Notes (Signed)
Completed orthostatics. HR went from 77 lying down to 122 standing up.

## 2013-11-30 NOTE — ED Notes (Signed)
Pt reports she was recently placed on tikosyn; was taken off lopressor and lasix on Friday; and is feeling like her heart is pounding and having sob

## 2013-11-30 NOTE — Progress Notes (Signed)
[name] is a 79 yo F pt of Dr. Caryl Comes who was admitted for Tikosyn initation from 10/13-10/16.  She was started on Tikosyn 250mg  BID in the hospital but dose was reduced to 125mg  BID on 10/15 due to prolonged QTc.    Pt was seen on 10/20.  At that time, she complained of lightheadedness, dizziness, and coughing up greenish mucus.  She was seen by NP at the Pulmonary office and given steroid injection and abx on 10/21.  She states that is some better but her biggest complaint continues to be the dizziness.  She states that it gets worse as she tries to do anything.    Orthostatic BP:  Lying: 144/69; HR 60; pt asymptomatic Sitting: 129/73; HR 66, pt lightheaded when she sat up  Standing: 96/67; HR 77; pt continued to be lightheaded Standing: 116/79; HR 78; lightheaded and nauseous.   Pt has been compliant with Tikosyn and not missed any doses since discharge.   EKG reviewed by Dr. Caryl Comes showed normal sinus rhythm with QTc 513 msec. Vent rate of 61.   No current facility-administered medications for this visit.   Current Outpatient Prescriptions  Medication Sig Dispense Refill  . Aclidinium Bromide (TUDORZA PRESSAIR) 400 MCG/ACT AEPB Inhale 1 puff into the lungs 2 (two) times daily.  3 each  2  . apixaban (ELIQUIS) 5 MG TABS tablet Take 1 tablet (5 mg total) by mouth 2 (two) times daily.  180 tablet  1  . diltiazem (CARDIZEM CD) 240 MG 24 hr capsule Take 1 capsule (240 mg total) by mouth daily.  90 capsule  1  . dofetilide (TIKOSYN) 125 MCG capsule Take 1 capsule (125 mcg total) by mouth 2 (two) times daily.  14 capsule  0  . doxycycline (VIBRA-TABS) 100 MG tablet Take 1 tablet (100 mg total) by mouth 2 (two) times daily.  14 tablet  0  . esomeprazole (NEXIUM) 40 MG capsule Take 40 mg by mouth daily before breakfast.        . levalbuterol (XOPENEX HFA) 45 MCG/ACT inhaler Inhale 2 puffs into the lungs every 6 (six) hours as needed for wheezing.      [name] levothyroxine (SYNTHROID, LEVOTHROID)  25 MCG tablet Take 25 mcg by mouth daily before breakfast.       . Polyethyl Glycol-Propyl Glycol (SYSTANE OP) Place 1 drop into both eyes daily.       . potassium chloride SA (K-DUR,KLOR-CON) 20 MEQ tablet Take 2 tablets (40 mEq total) by mouth daily.  60 tablet  3  . traMADol (ULTRAM) 50 MG tablet Take 1 tablet (50 mg total) by mouth every 6 (six) hours as needed. Pain  30 tablet  0  . VITAMIN D, CHOLECALCIFEROL, PO Take 1,000 Units by mouth daily.       . [DISCONTINUED] budesonide-formoterol (SYMBICORT) 160-4.5 MCG/ACT inhaler Inhale 2 puffs into the lungs 2 (two) times daily.  1 Inhaler  12  . [DISCONTINUED] calcium citrate-vitamin D (CITRACAL+D) 315-200 MG-UNIT per tablet Take 1 tablet by mouth 2 (two) times daily.         Facility-Administered Medications Ordered in Other Visits  Medication Dose Route Frequency Provider Last Rate Last Dose  . potassium chloride 20 MEQ/15ML (10%) liquid 40 mEq  40 mEq Oral Once Fay Records, MD       Allergies  Allergen Reactions  . Spiriva [Tiotropium Bromide Monohydrate] Other (See Comments)    Throat irritation  . Statins     Legs hurt  .  Levofloxacin Nausea Only    Unable to tolerate more than 7days  . Budesonide-Formoterol Fumarate     Tachycardia  . Morphine     REACTION: double vision  . Penicillins     REACTION: swelling/rash  . Phenazopyridine Hcl     *piridium* REACTION: face red  . Pneumococcal Vaccine     REACTION: rash and cough  . Pneumococcal Vaccines Swelling    Redding of the skin  . Prednisone     REACTION: rash  . Prednisone Swelling  . Sulfonamide Derivatives     REACTION: rash  . Tylenol [Acetaminophen]     Tylenol #3 -- rash and swelling - takes plain Tylenol ok  . Penicillins Swelling and Rash  . Sulfa Antibiotics Rash

## 2013-11-30 NOTE — ED Notes (Signed)
Called [facility] to give report for 2nd time. Was told to hold and got disconnected.

## 2013-11-30 NOTE — ED Notes (Signed)
Called [facility] to give report. Talked with Network engineer. Receiving RN to call back for report.

## 2013-11-30 NOTE — Progress Notes (Signed)
Renee [name] is a 79 yo F pt of Dr. Caryl Comes who was admitted for Tikosyn initation from 10/13-10/16.  She was started on Tikosyn 250mg  BID in the hospital but dose was reduced to 125mg  BID on 10/15 due to prolonged QTc.    Pt states she has not felt good since leaving the hospital.  She states she ha sa stopped up nose and coughing up greenish flem since leaving the hospital.  She also reports she is lightheaded and dizzy at times.  She has had some achiness which she describes as all of the fibers in her muscles hurting, which is different from prior to hospitalization.  She does not report any fevers or chills.   Pt has been compliant with Tikosyn and not missed any doses since discharge.   EKG reviewed by Dr. Caryl Comes showed normal sinus rhythm with QTc 494 msec. Vent rate of 67.   Current Outpatient Prescriptions  Medication Sig Dispense Refill  . Aclidinium Bromide (TUDORZA PRESSAIR) 400 MCG/ACT AEPB Inhale 1 puff into the lungs 2 (two) times daily.  3 each  2  . ALPRAZolam (XANAX) 0.25 MG tablet Take 1 tablet by mouth as needed for anxiety or sleep.       [name] apixaban (ELIQUIS) 5 MG TABS tablet Take 1 tablet (5 mg total) by mouth 2 (two) times daily.  180 tablet  1  . diltiazem (CARDIZEM CD) 240 MG 24 hr capsule Take 1 capsule (240 mg total) by mouth daily.  90 capsule  1  . dofetilide (TIKOSYN) 125 MCG capsule Take 1 capsule (125 mcg total) by mouth 2 (two) times daily.  14 capsule  0  . doxycycline (VIBRA-TABS) 100 MG tablet Take 1 tablet (100 mg total) by mouth 2 (two) times daily.  14 tablet  0  . esomeprazole (NEXIUM) 40 MG capsule Take 40 mg by mouth daily before breakfast.        . furosemide (LASIX) 80 MG tablet Take 0.5 tablets (40 mg total) by mouth daily.  30 tablet  3  . levalbuterol (XOPENEX HFA) 45 MCG/ACT inhaler Inhale 2 puffs into the lungs every 6 (six) hours as needed for wheezing.      [name] levothyroxine (SYNTHROID, LEVOTHROID) 25 MCG tablet Take 25 mcg by mouth daily before  breakfast.       . nitroGLYCERIN (NITROSTAT) 0.4 MG SL tablet PLACE 1 TABLET UNDER THE TONGUE EVERY 5 MINUTES AS NEEDED FOR CHEST PAIN  25 tablet  3  . NON FORMULARY as needed. Oxygen - 3-4L pulse with extertion, 2L continuious QHS.      [name] Glycol-Propyl Glycol (SYSTANE OP) Place 1 drop into both eyes daily.       . potassium chloride SA (K-DUR,KLOR-CON) 20 MEQ tablet Take 2 tablets (40 mEq total) by mouth daily.  60 tablet  3  . traMADol (ULTRAM) 50 MG tablet Take 1 tablet (50 mg total) by mouth every 6 (six) hours as needed. Pain  30 tablet  0  . VITAMIN D, CHOLECALCIFEROL, PO Take 1,000 Units by mouth daily.       . [DISCONTINUED] budesonide-formoterol (SYMBICORT) 160-4.5 MCG/ACT inhaler Inhale 2 puffs into the lungs 2 (two) times daily.  1 Inhaler  12  . [DISCONTINUED] calcium citrate-vitamin D (CITRACAL+D) 315-200 MG-UNIT per tablet Take 1 tablet by mouth 2 (two) times daily.         No current facility-administered medications for this visit.   Allergies  Allergen Reactions  . Spiriva [Tiotropium Bromide Monohydrate]  Other (See Comments)    Throat irritation  . Statins     Legs hurt  . Levofloxacin Nausea Only    Unable to tolerate more than 7days  . Budesonide-Formoterol Fumarate     Tachycardia  . Morphine     REACTION: double vision  . Penicillins     REACTION: swelling/rash  . Phenazopyridine Hcl     *piridium* REACTION: face red  . Pneumococcal Vaccine     REACTION: rash and cough  . Pneumococcal Vaccines Swelling    Redding of the skin  . Prednisone     REACTION: rash  . Prednisone Swelling  . Sulfonamide Derivatives     REACTION: rash  . Tylenol [Acetaminophen]     Tylenol #3 -- rash and swelling - takes plain Tylenol ok  . Penicillins Swelling and Rash  . Sulfa Antibiotics Rash   Agree with note Signed: Fransico Him, MD Secretary

## 2013-11-30 NOTE — Telephone Encounter (Signed)
Calling stating that she was in office Friday 10/23 and saw Kathrynn Running.  Gay Filler and Dr. Caryl Comes stopped her Metoprolol. On Saturday she started having palpitations, heart pounding, pulse rate "real" fast. States yesterday (Sunday) when she walked down hall was having SOB and heart pounding.  Spoke w/Dr. Caryl Comes who suggested that she go to ER.  She was agreeable with the plan and will proceed to [facility].  Notified Antony Salmon of pt going to ER.

## 2013-11-30 NOTE — H&P (Addendum)
Primary Physician: Primary Cardiologist:  Martin[place],    HPI: Patient presents to the ER with palpitations, SOB  Patient is a 79 yo with PAF(CHADs Vasc 4), CAD (s/p CABG  Myoview in 2014 normal), chronic diastolic CHF, COPD, GERD, RBBB. HTN, HLD  And hypothyroidism. IN regards to afib she has been seen by Olin Pia in July   REcomm Tikosyn fllowed by ablation if that failed.  Patient did not want to proceed at tht time She was seen in clinic in Sept by Tommas Olp  Doing OK   She was admitted on 10/13 for Tikosyn load.  Discharged on 125 bid (decreased from 250 because of QT prolongation)    Since D/c the patient says she has been dizzy.   Did not get better  Got worse  No syncope Went to office on Fri  Discussed with S Earl  BP init 140s  Dropped to 90s with standing  With this lopressor stopped  Lasix cut in 1/2 The patinet says over the weekend nothing got better  Laid on sofa most of time  Dizzy  Felt heart racing.    Patient called the office today saying that her heart was out of rhythm since Sat    Is now on treatment for URI  Doxycycline     Past Medical History  Diagnosis Date  . GERD (gastroesophageal reflux disease)   . IBS (irritable bowel syndrome)   . Bundle branch block, right   . Coronary artery disease     prior CABG in August of 2011  . Fatigue   . Joint pain   . Anxiety   . AF (atrial fibrillation)     Post op after CABG; off amiodarone  . HTN (hypertension)   . CHF (congestive heart failure)   . Dysphagia   . Colon polyps   . Hypertension   . Hypercholesteremia   . Normal nuclear stress test October 2012  . Hypothyroidism   . Palpitations   . PONV (postoperative nausea and vomiting)   . Family history of anesthesia complication     "PONV"  . Myocardial infarction 2011  . Chronic bronchitis     "get it q yr" (11/17/2013)  . Emphysema/COPD   . Pneumonia     "I've had a touch" 11/17/2013)  . Osteoarthritis of right hip   . Sciatic nerve pain       (Not in a hospital admission)     Infusions:   Allergies  Allergen Reactions  . Spiriva [Tiotropium Bromide Monohydrate] Other (See Comments)    Throat irritation  . Statins     Legs hurt  . Levofloxacin Nausea Only    Unable to tolerate more than 7days  . Budesonide-Formoterol Fumarate     Tachycardia  . Morphine     REACTION: double vision  . Penicillins     REACTION: swelling/rash  . Phenazopyridine Hcl     *piridium* REACTION: face red  . Pneumococcal Vaccine     REACTION: rash and cough  . Pneumococcal Vaccines Swelling    Redding of the skin  . Prednisone     REACTION: rash  . Prednisone Swelling  . Sulfonamide Derivatives     REACTION: rash  . Tylenol [Acetaminophen]     Tylenol #3 -- rash and swelling - takes plain Tylenol ok  . Penicillins Swelling and Rash  . Sulfa Antibiotics Rash    History   Social History  . Marital Status: Married    Spouse  Name: N/A    Number of Children: 1  . Years of Education: N/A   Occupational History  . nurse     retired   Social History Main Topics  . Smoking status: Former Smoker -- 1.00 packs/day for 50 years    Types: Cigarettes    Quit date: 09/21/2009  . Smokeless tobacco: Never Used  . Alcohol Use: No  . Drug Use: No  . Sexual Activity: No   Other Topics Concern  . Not on file   Social History Narrative   ** Merged History Encounter **       Daily caffeine use: Dr Malachi Bonds and 2 cups coffee per day    Family History  Problem Relation Age of Onset  . Heart disease Mother   . Transient ischemic attack Mother   . Hypertension Mother   . Deep vein thrombosis Mother   . Heart attack Father   . Hypertension Father   . Diabetes Father   . Heart disease Father     Heart disease before age 61  . Hyperlipidemia Father   . Breast cancer Sister   . Cancer Sister   . Other Sister     brain tumor  . Colon polyps Brother   . Hypertension Brother   . Colon cancer Daughter     REVIEW OF  SYSTEMS:  All systems reviewed  Negative to the above problem except as noted above.    PHYSICAL EXAM: Filed Vitals:   11/30/13 1220  BP: 122/79  Pulse: 111  Temp: 98 F (36.7 C)  Resp: 19    No intake or output data in the 24 hours ending 11/30/13 1259  General:  Well appearing. No respiratory difficulty HEENT: normal Neck: supple. no JVD. Carotids 2+ bilat; no bruits. No lymphadenopathy or thryomegaly appreciated. Cor: PMI nondisplaced. Regular rate & rhythm. No rubs, gallops or murmurs. Lungs: clear Abdomen: soft, nontender, nondistended. No hepatosplenomegaly. No bruits or masses. Good bowel sounds. Extremities: no cyanosis, clubbing, rash, edema Neuro: alert & oriented x 3, cranial nerves grossly intact. moves all 4 extremities w/o difficulty. Affect pleasant.  ECG:  ST 109  Incomplete RBBB.    Results for orders placed during the hospital encounter of 11/30/13 (from the past 24 hour(s))  CBC     Status: None   Collection Time    11/30/13 12:22 PM      Result Value Ref Range   WBC 7.4  4.0 - 10.5 K/uL   RBC 5.10  3.87 - 5.11 MIL/uL   Hemoglobin 14.9  12.0 - 15.0 g/dL   HCT 46.0  36.0 - 46.0 %   MCV 90.2  78.0 - 100.0 fL   MCH 29.2  26.0 - 34.0 pg   MCHC 32.4  30.0 - 36.0 g/dL   RDW 14.3  11.5 - 15.5 %   Platelets 381  150 - 400 K/uL   No results found.   ASSESSMENT:  1.  Rhythm   patinet is a 79 yo with history of atril fib  Just started on Tikosyn earlier this month.  Dizzy since starting.  Seen in clinic on Fri  Lopressor stopped and lasix cut in 1/2  Patient remains dizzy.    Exam unremarkable  Will check orthostatics EKG and tele with SR Labs signif for K of 3.2.    Plan:  Check orhtostatics  Will prob add back small dose lopressor and follow Replete KCL   Continue anticoagulation.   Follw on telemetry.  With  K being low need to make sure not ventricular arrhythmia     2.  CAD  I am not convinced of active angina  3.  Chronic diastolic CHF  Patient  is not volume overloaded.    4.  RBBB  Chronic  5.  HTN  WIll need to follow Dose meds as tolerated.  6.  Hypokalemia  Replete  Needs to be greater than 4.  ? What dose K she will need  Lasix is now 1/2 of what it was. Check MG.

## 2013-11-30 NOTE — Assessment & Plan Note (Signed)
Pt continues to be lightheaded since starting Tikosyn.  Orthostatics revealed orthostatic hypotension.  Discussed with Dr. Caryl Comes.  Will adjust BP medications (stop metoprolol, decrease lasix and change timing of diltiazem) and see if this improves her dizziness.  She states she continues to want to try to take the Tikosyn and see if this may be the answer.  She has a follow up appt with the PA on 11/6. She will call with any problems in the interim.

## 2013-11-30 NOTE — ED Notes (Signed)
Pt transported to radiology.

## 2013-11-30 NOTE — ED Notes (Signed)
Cardiologists at bedside

## 2013-11-30 NOTE — Progress Notes (Signed)
Sent to Dr. Radford Pax for sign-off as DOD

## 2013-11-30 NOTE — Assessment & Plan Note (Signed)
Discussed pt's symptoms with Dr. Caryl Comes.  Dizziness and lightheadedness may be related to Tikosyn but other symptoms may be due to infection.  Discussed with pt the options of remaining on Tikosyn and seeing if symptoms improve versus stopping Tikosyn.  She is aware that if she stops Tikosyn and symptoms do not improve, she would have to return to hospital for 3 days to restart.  She would like to continue Tikosyn at this time and see if symptoms resolve.  I have asked her to call her PCP regarding cold-like symptoms.  Will follow up at the end of the week.

## 2013-11-30 NOTE — Telephone Encounter (Signed)
New message    Patient calling C/O heart out of rhythm on Saturday - metoprolol was discontinue on Friday.

## 2013-11-30 NOTE — Progress Notes (Signed)
This is not my patient.

## 2013-12-01 DIAGNOSIS — I2510 Atherosclerotic heart disease of native coronary artery without angina pectoris: Secondary | ICD-10-CM

## 2013-12-01 DIAGNOSIS — R079 Chest pain, unspecified: Secondary | ICD-10-CM | POA: Diagnosis not present

## 2013-12-01 DIAGNOSIS — I480 Paroxysmal atrial fibrillation: Secondary | ICD-10-CM | POA: Diagnosis not present

## 2013-12-01 DIAGNOSIS — I4510 Unspecified right bundle-branch block: Secondary | ICD-10-CM

## 2013-12-01 DIAGNOSIS — R42 Dizziness and giddiness: Secondary | ICD-10-CM | POA: Diagnosis not present

## 2013-12-01 LAB — BASIC METABOLIC PANEL
ANION GAP: 14 (ref 5–15)
BUN: 17 mg/dL (ref 6–23)
CO2: 27 mEq/L (ref 19–32)
Calcium: 9.6 mg/dL (ref 8.4–10.5)
Chloride: 99 mEq/L (ref 96–112)
Creatinine, Ser: 1.03 mg/dL (ref 0.50–1.10)
GFR, EST AFRICAN AMERICAN: 59 mL/min — AB (ref >90)
GFR, EST NON AFRICAN AMERICAN: 51 mL/min — AB (ref >90)
Glucose, Bld: 127 mg/dL — ABNORMAL HIGH (ref 70–99)
POTASSIUM: 4.5 meq/L (ref 3.7–5.3)
SODIUM: 140 meq/L (ref 137–147)

## 2013-12-01 MED ORDER — SODIUM CHLORIDE 0.9 % IV SOLN
INTRAVENOUS | Status: AC
  Administered 2013-12-01: 10:00:00 via INTRAVENOUS

## 2013-12-01 MED ORDER — ACETAMINOPHEN 325 MG PO TABS
650.0000 mg | ORAL_TABLET | Freq: Four times a day (QID) | ORAL | Status: DC | PRN
  Administered 2013-12-01: 650 mg via ORAL
  Filled 2013-12-01: qty 2

## 2013-12-01 NOTE — Progress Notes (Addendum)
SUBJECTIVE: Still with dizziness with standing this am but improved. No palpitations this am. No chest pain or SOB.   BP 122/60  Pulse 65  Temp(Src) 97.5 F (36.4 C) (Oral)  Resp 18  Ht 5\' 6"  (1.676 m)  Wt 154 lb 5.2 oz (70 kg)  BMI 24.92 kg/m2  SpO2 98% No intake or output data in the 24 hours ending 12/01/13 0746  PHYSICAL EXAM General: Well developed, well nourished, in no acute distress. Alert and oriented x 3.  Psych:  Good affect, responds appropriately Neck: No JVD. No masses noted.  Lungs: Clear bilaterally with no wheezes or rhonci noted.  Heart: RRR with no murmurs noted. Abdomen: Bowel sounds are present. Soft, non-tender.  Extremities: No lower extremity edema.   LABS: Basic Metabolic Panel:  Recent Labs  11/30/13 1222 11/30/13 1353  NA 143  --   K 3.2*  --   CL 97  --   CO2 27  --   GLUCOSE 115*  --   BUN 17  --   CREATININE 1.12*  --   CALCIUM 9.9  --   MG  --  1.8   CBC:  Recent Labs  11/30/13 1222  WBC 7.4  HGB 14.9  HCT 46.0  MCV 90.2  PLT 381    Current Meds: . apixaban  5 mg Oral BID  . aspirin EC  81 mg Oral Daily  . cholecalciferol  1,000 Units Oral Daily  . diltiazem  240 mg Oral Daily  . dofetilide  125 mcg Oral BID  . doxycycline  100 mg Oral BID  . furosemide  40 mg Oral Daily  . levothyroxine  25 mcg Oral QAC breakfast  . metoprolol tartrate  12.5 mg Oral BID  . pantoprazole  40 mg Oral Daily  . potassium chloride SA  40 mEq Oral TID   Echo 11/20/13: Left ventricle: The cavity size was normal. Systolic function was normal. The estimated ejection fraction was in the range of 50% to 55%. Wall motion was normal; there were no regional wall motion abnormalities. Doppler parameters are consistent with abnormal left ventricular relaxation (grade 1 diastolic dysfunction). There was no evidence of elevated ventricular filling pressure by Doppler parameters. - Aortic valve: Trileaflet; normal thickness leaflets. There  was no regurgitation. - Aortic root: The aortic root was normal in size. - Mitral valve: There was no regurgitation. - Left atrium: The atrium was normal in size. - Right ventricle: Systolic function was normal. - Right atrium: The atrium was normal in size. - Tricuspid valve: There was mild regurgitation. - Pulmonary arteries: Systolic pressure was within the normal range. - Pericardium, extracardiac: There was no pericardial effusion. Impressions: - There appears to be dyssynchrony in left ventricular conduction. Overal LVEF is low normal, estimated at 50%. Abnormal relaxation with normal filling pressures. Normal right ventricular size and function. Mild tricuspid regurgitation. Normal RVSP.  Chest x-ray: 11/30/13: Borderline enlargement of cardiac silhouette post CABG.  Atherosclerotic calcification aorta.  Mediastinal contours and pulmonary vascularity normal.  Bibasilar atelectasis greater on LEFT.  No acute infiltrate, pleural effusion or pneumothorax.  Bones demineralized.  IMPRESSION:  Persistent bibasilar atelectasis greater on LEFT.  ASSESSMENT AND PLAN: 79 yo female with history of CAD, PAF with recent Tikosyn loading admitted with dizziness, SOB.   1. Atrial fibrillation, paroxysmal: currently in sinus with periods of bradycardia at night. She is on metoprolol, Cardizem and Tikosyn. EKG pending this am. She is on apixiban for anti-coagulation.  2. Dizziness: Orthostatic hypotension noted. Drop in systolic BP from 364 down to 105 at time of admission but orthostatics normal last night. She has been on Lasix at home. Will hold Lasix today and will gently hydrate. Repeat orthostatics later today.  She notes the dizziness since starting Tikosyn. Will need a monitor at time of discharge. Will hold metoprolol.   3. CAD: No angina. Echo 11/20/13 with normal LVEF.   4. Chronic diastolic CHF: She appears euvolemic.   5. RBBB: Chronic  6. HTN: controlled. Orthostasis as  above.   7. Hypokalemia: K replaced yesterday. Will check BMET this am.     Renee [name]  10/27/20157:46 AM

## 2013-12-01 NOTE — Progress Notes (Signed)
UR completed 

## 2013-12-01 NOTE — Progress Notes (Signed)
QTC at 2000 was 350.

## 2013-12-02 ENCOUNTER — Encounter (HOSPITAL_COMMUNITY): Payer: Self-pay | Admitting: Physician Assistant

## 2013-12-02 ENCOUNTER — Other Ambulatory Visit: Payer: Self-pay | Admitting: Physician Assistant

## 2013-12-02 DIAGNOSIS — R42 Dizziness and giddiness: Secondary | ICD-10-CM | POA: Diagnosis not present

## 2013-12-02 DIAGNOSIS — I4510 Unspecified right bundle-branch block: Secondary | ICD-10-CM | POA: Diagnosis not present

## 2013-12-02 DIAGNOSIS — I10 Essential (primary) hypertension: Secondary | ICD-10-CM | POA: Diagnosis present

## 2013-12-02 DIAGNOSIS — R079 Chest pain, unspecified: Secondary | ICD-10-CM | POA: Diagnosis not present

## 2013-12-02 DIAGNOSIS — E785 Hyperlipidemia, unspecified: Secondary | ICD-10-CM | POA: Diagnosis present

## 2013-12-02 DIAGNOSIS — I480 Paroxysmal atrial fibrillation: Secondary | ICD-10-CM | POA: Diagnosis not present

## 2013-12-02 LAB — BASIC METABOLIC PANEL
ANION GAP: 11 (ref 5–15)
BUN: 21 mg/dL (ref 6–23)
CHLORIDE: 102 meq/L (ref 96–112)
CO2: 27 mEq/L (ref 19–32)
Calcium: 9.3 mg/dL (ref 8.4–10.5)
Creatinine, Ser: 1.05 mg/dL (ref 0.50–1.10)
GFR, EST AFRICAN AMERICAN: 58 mL/min — AB (ref >90)
GFR, EST NON AFRICAN AMERICAN: 50 mL/min — AB (ref >90)
Glucose, Bld: 99 mg/dL (ref 70–99)
Potassium: 5.2 mEq/L (ref 3.7–5.3)
SODIUM: 140 meq/L (ref 137–147)

## 2013-12-02 NOTE — Discharge Summary (Signed)
Discharge Summary   Patient ID: Renee [name] MRN: [id number], DOB/AGE: Jan 31, 1935 79 y.o. Admit date: 11/30/2013 D/C date:     12/02/2013  Primary Cardiologist: Dr. Martinique  Principal Problem:   Dizziness Active Problems:   Chronic diastolic CHF (congestive heart failure)   COPD (chronic obstructive pulmonary disease)   Bundle branch block, right   Coronary artery disease   Paroxysmal a-fib   HLD (hyperlipidemia)   Hypertension   GERD (gastroesophageal reflux disease)   Admission Dates: 11/30/13-12/02/13 Discharge Diagnosis: dizziness and SOB. Now resolved.  HPI: Renee [name] is a 79 y.o. female with a history of PAF s/p failed ablation and on Tikosyn and Eliquis, CAD s/p CABG 2011 (normal myoview in 05/2012), chronic diastolic CHF, COPD, GERD, chronic RBBB, HTN, HLD and hypothyroidism who was admitted to [facility] on 11/30/13 for palpitations and SOB.   She was admitted on 11/17/13 for Tikosyn load and discharged on 125mg  bid (decreased from 250 because of QT prolongation). Since discharge the patient has complained of dizziness. She was seen in the office by Elberta Leatherwood PharmD on 11/27/13 where her BP was noted to drop from 140s to 90s with standing. Thus, her lopressor was stopped and her Lasix cut in half. However, the patient remained dizzy and eventually was admitted to [facility] for observation on 11/30/13.   Hospital Course  Atrial fibrillation, paroxysmal: she has been maintaining NSR -- Her metoprolol was initially resumed upon admission; however, she was noted to have some bradycardia overnight and it was subsequently discontiuned again. After discontinuation, the nocturnal bradycardia resolved.  -- Will not restart metoprolol. -- She will continue on Cardizem and Tikosyn. EKG yesterday with QTc 592msec. -- Continue apixiban for anti-coagulation.   Dizziness: Orthostatic hypotension noted. She was hydrated while admitted and Lasix was held. She noted the dizziness since  starting Tikosyn but Dr. Julianne Handler reviewed with EP and her orthostasis and dizziness is not likely related to the Tikosyn.  -- 48 hour holter monitor arranged for her to pick up on 12/07/13  CAD: No angina. Echo 11/20/13 with normal LVEF. -- Normal nuclear stress test in 05/2012   Chronic diastolic CHF: She appears euvolemic.  -- Will resume Lasix 40 mg daily.  RBBB: Chronic   HTN: controlled. Orthostasis as above.   Hypokalemia: Resolved.   Bronchitis: Completes course of antibiotics tomorrow.    The patient has had an uncomplicated hospital course and is recovering well.  She has been seen by Dr. Julianne Handler today and deemed ready for discharge home. All follow-up appointments have been scheduled. Discharge medications are listed below.   Discharge Vitals: Blood pressure 135/48, pulse 79, temperature 97.6 F (36.4 C), temperature source Oral, resp. rate 18, height 5\' 6"  (1.676 m), weight 156 lb 8.4 oz (71 kg), SpO2 98.00%.  Labs: Lab Results  Component Value Date   WBC 7.4 11/30/2013   HGB 14.9 11/30/2013   HCT 46.0 11/30/2013   MCV 90.2 11/30/2013   PLT 381 11/30/2013     Recent Labs Lab 12/02/13 0339  NA 140  K 5.2  CL 102  CO2 27  BUN 21  CREATININE 1.05  CALCIUM 9.3  GLUCOSE 99      Diagnostic Studies/Procedures   Dg Chest 2 View  11/30/2013   CLINICAL DATA:  Chest pain since 11/24/2013, syncope, personal history of GERD, CHF, COPD, coronary artery disease  EXAM: CHEST  2 VIEW  COMPARISON:  11/26/2013  FINDINGS: Borderline enlargement of cardiac silhouette post CABG.  Atherosclerotic  calcification aorta.  Mediastinal contours and pulmonary vascularity normal.  Bibasilar atelectasis greater on LEFT.  No acute infiltrate, pleural effusion or pneumothorax.  Bones demineralized.  IMPRESSION: Persistent bibasilar atelectasis greater on LEFT.   Electronically Signed   By: Lavonia Dana M.D.   On: 11/30/2013 16:09   Dg Chest 2 View  11/26/2013   CLINICAL DATA:   COPD.  Cough 7 days.  EXAM: CHEST  2 VIEW  COMPARISON:  03/17/2013 and 06/02/2012  FINDINGS: Median sternotomy wires unchanged. Lungs are hypoinflated with mild the left perihilar scarring. Mild stable elevation of the left hemidiaphragm. No focal consolidation or effusion. Cardiomediastinal silhouette and remainder the exam is unchanged.  IMPRESSION: Hypoinflation without acute cardiopulmonary disease.   Electronically Signed   By: Marin Olp M.D.   On: 11/26/2013 16:54    Discharge Medications     Medication List         Aclidinium Bromide 400 MCG/ACT Aepb  Commonly known as:  TUDORZA PRESSAIR  Inhale 1 puff into the lungs 2 (two) times daily.     ALPRAZolam 0.25 MG tablet  Commonly known as:  XANAX  Take 0.25 mg by mouth 2 (two) times daily as needed for anxiety or sleep.     apixaban 5 MG Tabs tablet  Commonly known as:  ELIQUIS  Take 1 tablet (5 mg total) by mouth 2 (two) times daily.     cholecalciferol 1000 UNITS tablet  Commonly known as:  VITAMIN D  Take 1,000 Units by mouth daily.     diltiazem 240 MG 24 hr capsule  Commonly known as:  CARDIZEM CD  Take 1 capsule (240 mg total) by mouth daily.     dofetilide 125 MCG capsule  Commonly known as:  TIKOSYN  Take 1 capsule (125 mcg total) by mouth 2 (two) times daily.     doxycycline 100 MG tablet  Commonly known as:  VIBRA-TABS  Take 1 tablet (100 mg total) by mouth 2 (two) times daily.     esomeprazole 40 MG capsule  Commonly known as:  NEXIUM  Take 40 mg by mouth daily before breakfast.     furosemide 40 MG tablet  Commonly known as:  LASIX  Take 40 mg by mouth daily.     levalbuterol 45 MCG/ACT inhaler  Commonly known as:  XOPENEX HFA  Inhale 2 puffs into the lungs every 6 (six) hours as needed for wheezing.     levothyroxine 25 MCG tablet  Commonly known as:  SYNTHROID, LEVOTHROID  Take 25 mcg by mouth daily before breakfast.     nitroGLYCERIN 0.4 MG SL tablet  Commonly known as:  NITROSTAT  Place  0.4 mg under the tongue every 5 (five) minutes as needed for chest pain.     potassium chloride SA 20 MEQ tablet  Commonly known as:  K-DUR,KLOR-CON  Take 2 tablets (40 mEq total) by mouth daily.     SYSTANE OP  Place 1 drop into both eyes daily.     traMADol 50 MG tablet  Commonly known as:  ULTRAM  Take 1 tablet (50 mg total) by mouth every 6 (six) hours as needed. Pain        Disposition   The patient will be discharged in stable condition to home.  Follow-up Information   Follow up with Richardson Dopp, PA-C On 12/11/2013. (@ 10:10 am.)    Specialty:  Physician Assistant   Contact information:   [address] [place] [address]  [phone]       Follow up with [place] On 12/07/2013. (10:30am to pick up your 48 hour heart monitor.)    Contact information:   [place] [address] [phone]        Duration of Discharge Encounter: Greater than 30 minutes including physician and PA time.  SignedGrandville Silos, Rhylee Nunn R PA-C 12/02/2013, 11:07 AM

## 2013-12-02 NOTE — Discharge Summary (Signed)
See full note this am. cdm 

## 2013-12-02 NOTE — Progress Notes (Addendum)
     SUBJECTIVE: She feels much better. No dizziness this am. No chest pain or SOB.   BP 128/59  Pulse 72  Temp(Src) 97.9 F (36.6 C) (Oral)  Resp 18  Ht 5\' 6"  (1.676 m)  Wt 156 lb 8.4 oz (71 kg)  BMI 25.28 kg/m2  SpO2 96%  Intake/Output Summary (Last 24 hours) at 12/02/13 0739 Last data filed at 12/01/13 1900  Gross per 24 hour  Intake    450 ml  Output      0 ml  Net    450 ml    PHYSICAL EXAM General: Well developed, well nourished, in no acute distress. Alert and oriented x 3.  Psych:  Good affect, responds appropriately Neck: No JVD. No masses noted.  Lungs: Clear bilaterally with no wheezes or rhonci noted.  Heart: RRR with no murmurs noted. Abdomen: Bowel sounds are present. Soft, non-tender.  Extremities: No lower extremity edema.   LABS: Basic Metabolic Panel:  Recent Labs  11/30/13 1353 12/01/13 0931 12/02/13 0339  NA  --  140 140  K  --  4.5 5.2  CL  --  99 102  CO2  --  27 27  GLUCOSE  --  127* 99  BUN  --  17 21  CREATININE  --  1.03 1.05  CALCIUM  --  9.6 9.3  MG 1.8  --   --    CBC:  Recent Labs  11/30/13 1222  WBC 7.4  HGB 14.9  HCT 46.0  MCV 90.2  PLT 381   Current Meds: . apixaban  5 mg Oral BID  . cholecalciferol  1,000 Units Oral Daily  . diltiazem  240 mg Oral Daily  . dofetilide  125 mcg Oral BID  . doxycycline  100 mg Oral BID  . levothyroxine  25 mcg Oral QAC breakfast  . pantoprazole  40 mg Oral Daily  . potassium chloride SA  40 mEq Oral TID     ASSESSMENT AND PLAN: 79 yo female with history of CAD, PAF with recent Tikosyn loading admitted with dizziness, SOB.   1. Atrial fibrillation, paroxysmal: currently in sinus. No brady overnight off of metoprolol. Will not restart metoprolol. She is on Cardizem and Tikosyn. EKG yesterday with QTc 547msec. She is on apixiban for anti-coagulation.   2. Dizziness: Orthostatic hypotension noted. She feels better today. She was hydrated yesterday. Lasix was held.  She notes the  dizziness since starting Tikosyn but reviewed with EP and her orthostasis and dizziness is not likely related to the Tikosyn. Will need a monitor at time of discharge. I would contact the office to set up 48 hour monitor.  3. CAD: No angina. Echo 11/20/13 with normal LVEF.   4. Chronic diastolic CHF: She appears euvolemic.   5. RBBB: Chronic   6. HTN: controlled. Orthostasis as above.   7. Hypokalemia: Resolved.    8. Bronchitis: Completes course of antibiotics tomorrow.   Dispo: Discharge home. 48 hour monitor. No metoprolol. Resume Lasix 40 mg daily. She will need close f/u with Dr. Caryl Comes or EP NP in next 1-2 weeks.       Gorden Stthomas  10/28/20157:39 AM

## 2013-12-07 ENCOUNTER — Encounter: Payer: Self-pay | Admitting: *Deleted

## 2013-12-07 ENCOUNTER — Encounter (INDEPENDENT_AMBULATORY_CARE_PROVIDER_SITE_OTHER): Payer: Medicare Other

## 2013-12-07 DIAGNOSIS — R42 Dizziness and giddiness: Secondary | ICD-10-CM

## 2013-12-07 NOTE — Progress Notes (Signed)
Patient ID: Renee [name], female   DOB: Dec 14, 1934, 79 y.o.   MRN: [id number] [facility] 48 hour holter monitor applied to patient.

## 2013-12-11 ENCOUNTER — Ambulatory Visit (INDEPENDENT_AMBULATORY_CARE_PROVIDER_SITE_OTHER): Payer: Medicare Other | Admitting: Physician Assistant

## 2013-12-11 ENCOUNTER — Encounter: Payer: Self-pay | Admitting: Physician Assistant

## 2013-12-11 VITALS — BP 111/70 | HR 119 | Ht 66.0 in | Wt 153.0 lb

## 2013-12-11 DIAGNOSIS — R42 Dizziness and giddiness: Secondary | ICD-10-CM

## 2013-12-11 DIAGNOSIS — I951 Orthostatic hypotension: Secondary | ICD-10-CM

## 2013-12-11 DIAGNOSIS — J449 Chronic obstructive pulmonary disease, unspecified: Secondary | ICD-10-CM

## 2013-12-11 DIAGNOSIS — E785 Hyperlipidemia, unspecified: Secondary | ICD-10-CM

## 2013-12-11 DIAGNOSIS — I10 Essential (primary) hypertension: Secondary | ICD-10-CM | POA: Diagnosis not present

## 2013-12-11 DIAGNOSIS — I25810 Atherosclerosis of coronary artery bypass graft(s) without angina pectoris: Secondary | ICD-10-CM

## 2013-12-11 DIAGNOSIS — I2510 Atherosclerotic heart disease of native coronary artery without angina pectoris: Secondary | ICD-10-CM

## 2013-12-11 DIAGNOSIS — I5032 Chronic diastolic (congestive) heart failure: Secondary | ICD-10-CM

## 2013-12-11 DIAGNOSIS — I480 Paroxysmal atrial fibrillation: Secondary | ICD-10-CM

## 2013-12-11 MED ORDER — POTASSIUM CHLORIDE CRYS ER 20 MEQ PO TBCR: EXTENDED_RELEASE_TABLET | ORAL | Status: DC

## 2013-12-11 NOTE — Progress Notes (Signed)
Cardiology Office Note   Date:  12/11/2013   ID:  Renee [name], DOB April 23, 1934, MRN [id number]  PCP:  [name], MD  Cardiologist:  Dr. Peter Martinique   Electrophysiologist:  Dr. Virl Axe    History of Present Illness: Renee [name] is a 79 y.o. female with a hx of PAF, CAD s/p CABG 07/7589, diastolic HF, COPD, RBBB, HTN, HL.  She has had recurrent PAF with RVR.  She has been intolerant of Amiodarone in the past.  She was recently referred for Tikosyn load.  Admitted 10/13-10/16.  Tikosyn dose was reduced to 250 mcg >> 125 mcg due to long QT (QTc 530 ms).  After DC, patient FU in CVRR clinic with Elberta Leatherwood, PharmD.  She complained of dizziness and cough.  She was noted to have orthostatic hypotension.  Metoprolol was DC'd and Lasix was reduced.  She was then admitted 10/26-10/28 with continued dizziness in the setting of orthostatic hypotension. Metoprolol was resumed upon admission but resulted in bradycardia. This resolved with discontinuation. She remained on diltiazem and Tikosyn. She was hydrated with IV fluids and Lasix was held. Lasix was resumed at discharge. She was scheduled for 48 Hr Holter after DC.  She returns for follow-up.  She continues to feel dizzy.  She tells me she felt better before starting Tikosyn.  She has lightheadedness with standing. She also feels dizzy sitting still.  She denies syncope.  She feels her heart racing at times.  She thinks she is having more AFib.  Her Holter monitor results are not back.  She has occasional chest pressure.  This is a chronic symptom that his unchanged.  She denies orthopnea, PND.  She notes LE edema if she stops Lasix.     Studies:  - LHC (5/11):  Left main <10%,, LAD bifurcation 70%, proximal circumflex 20-30%, RCA < 20%, EF 60% >> CABG (LIMA-LAD, SVG-DX)  - Echo (10/15):  EF 50-55%, normal wall motion, grade 1 diastolic dysfunction, mild TR  - Nuclear (4/14):  Normal stress nuclear study.  LV Ejection Fraction:  78%   Recent Labs/Images:  11/30/2013: Hemoglobin 14.9; Pro B Natriuretic peptide (BNP) 250.9 12/02/2013: BUN 21; Creatinine 1.05; Potassium 5.2; Sodium 140    Dg Chest 2 View  11/30/2013    IMPRESSION: Persistent bibasilar atelectasis greater on LEFT.   Electronically Signed   By: Lavonia Dana M.D.   On: 11/30/2013 16:09     Wt Readings from Last 3 Encounters:  12/11/13 153 lb (69.4 kg)  12/02/13 156 lb 8.4 oz (71 kg)  11/20/13 156 lb 14.4 oz (71.169 kg)     Past Medical History  Diagnosis Date  . GERD (gastroesophageal reflux disease)   . IBS (irritable bowel syndrome)   . Bundle branch block, right   . Coronary artery disease     a. CABG 09/2009    b. normal myoview in 05/2012  . Anxiety   . AF (atrial fibrillation)     a. Post op after CABG; off amiodarone  . HTN (hypertension)   . Chronic diastolic CHF (congestive heart failure)   . Dysphagia   . Colon polyps   . Hypertension   . HLD (hyperlipidemia)   . Hypothyroidism   . Palpitations   . Chronic bronchitis   . Emphysema/COPD   . Sciatic nerve pain     Current Outpatient Prescriptions  Medication Sig Dispense Refill  . Aclidinium Bromide (TUDORZA PRESSAIR) 400 MCG/ACT AEPB Inhale 1 puff into the lungs  2 (two) times daily. 3 each 2  . ALPRAZolam (XANAX) 0.25 MG tablet Take 0.25 mg by mouth 2 (two) times daily as needed for anxiety or sleep.    [name] apixaban (ELIQUIS) 5 MG TABS tablet Take 1 tablet (5 mg total) by mouth 2 (two) times daily. 180 tablet 1  . cholecalciferol (VITAMIN D) 1000 UNITS tablet Take 1,000 Units by mouth daily.    [name] diltiazem (CARDIZEM CD) 240 MG 24 hr capsule Take 1 capsule (240 mg total) by mouth daily. 90 capsule 1  . dofetilide (TIKOSYN) 125 MCG capsule Take 1 capsule (125 mcg total) by mouth 2 (two) times daily. 14 capsule 0  . esomeprazole (NEXIUM) 40 MG capsule Take 40 mg by mouth daily before breakfast.      . furosemide (LASIX) 40 MG tablet Take 40 mg by mouth daily.    [name] levalbuterol  (XOPENEX HFA) 45 MCG/ACT inhaler Inhale 2 puffs into the lungs every 6 (six) hours as needed for wheezing.    [name] levothyroxine (SYNTHROID, LEVOTHROID) 25 MCG tablet Take 25 mcg by mouth daily before breakfast.     . nitroGLYCERIN (NITROSTAT) 0.4 MG SL tablet Place 0.4 mg under the tongue every 5 (five) minutes as needed for chest pain.    [name] Glycol-Propyl Glycol (SYSTANE OP) Place 1 drop into both eyes daily.     . potassium chloride SA (K-DUR,KLOR-CON) 20 MEQ tablet Take 2 tablets (40 mEq total) by mouth daily. 60 tablet 3  . traMADol (ULTRAM) 50 MG tablet Take 1 tablet (50 mg total) by mouth every 6 (six) hours as needed. Pain 30 tablet 0  . [DISCONTINUED] budesonide-formoterol (SYMBICORT) 160-4.5 MCG/ACT inhaler Inhale 2 puffs into the lungs 2 (two) times daily. 1 Inhaler 12  . [DISCONTINUED] calcium citrate-vitamin D (CITRACAL+D) 315-200 MG-UNIT per tablet Take 1 tablet by mouth 2 (two) times daily.       No current facility-administered medications for this visit.     Allergies:   Spiriva; Statins; Levofloxacin; Budesonide-formoterol fumarate; Morphine; Penicillins; Phenazopyridine hcl; Pneumococcal vaccine; Pneumococcal vaccines; Prednisone; Prednisone; Sulfonamide derivatives; Tylenol; Penicillins; and Sulfa antibiotics   Social History:  The patient  reports that she quit smoking about 4 years ago. Her smoking use included Cigarettes. She has a 50 pack-year smoking history. She has never used smokeless tobacco. She reports that she does not drink alcohol or use illicit drugs.   Family History:  The patient's family history includes Breast cancer in her sister; Cancer in her sister; Colon cancer in her daughter; Colon polyps in her brother; Deep vein thrombosis in her mother; Diabetes in her father; Heart attack in her father; Heart disease in her father and mother; Hyperlipidemia in her father; Hypertension in her brother, father, and mother; Other in her sister; Transient ischemic  attack in her mother.   ROS:  Please see the history of present illness.   She has recently completed antibiotics for AECOPD. She notes scant amount of hemoptysis last week (faint streak in her sputum).     All other systems reviewed and negative.    PHYSICAL EXAM: VS:  BP 132/60 mmHg  Pulse 95  Ht 5\' 6"  (1.676 m)  Wt 153 lb (69.4 kg)  BMI 24.71 kg/m2 Well nourished, well developed, in no acute distress HEENT: normal Neck: no JVD Cardiac:  normal S1, S2; RRR; no murmur Lungs:  Decreased breath sounds bilaterally, no wheezing, rhonchi or rales Abd: soft, nontender, no hepatomegaly Ext:  no edema Skin: warm and dry  Neuro:  CNs 2-12 intact, no focal abnormalities noted  EKG:  NSR, HR 95, RBBB, QTc 490 ms, No changes   ASSESSMENT AND PLAN:  1.  Paroxysmal a-fib:  Maintaining NSR today.  She is frustrated with her symptoms since starting the Tikosyn.  She feels like she felt better before she started.  She wore a 4 Hr Holter, but the results are not available for review.  Her beta blocker was stopped due to bradycardia in the hospital.  She now has a faster HR in NSR.  She did have an orthostatic BP drop.  She did feel better in the hospital prior to DC.  Etiology of her symptoms is not clear - (a) Is it an intolerance to the Tikosyn?  (b) Is it orthostatic hypotension with compensatory tachycardia?  (c)  Is it recurrent AFib with RVR.  As noted below, she is still orthostatic.  Will adjust diuretics.      -  If Monitor demonstrates recurrent AFib, consider DC Tikosyn.    -  If HR remains elevated despite correcting BP drop, consider resuming Metoprolol at a lower dose (ie 12.5 bid).     -  If dizziness continues despite correcting BP and HR, she will likely need to DC Tikosyn and see Dr. Thompson Grayer for consideration of PVI ablation.  2.  Dizziness:  Orthostatic VS were checked today.  BP 130/79 lying >>> 111/70 standing (HR 90 >>> 119).  She was symptomatic with this.  I suspect her  symptoms are related mainly to orthostatic hypotension with compensatory HR increase.  She did have AECOPD recently.  Cough is improved.  Question if this was the nidus for volume depletion >> orthostasis (she states she never had this problem before the Tikosyn).      -  Decrease Lasix to 20 mg on Mon, Wed, Fri only.    -  Decrease K+ to 20 mEq on Mon, Wed, Fri.    -  She may take an extra Lasix and K+ if she has weight gain >/= 3 lbs in 1 day or increased edema.      -  Repeat BMET, Mg2+ 1 week.    -  OTC compression hose.    -  We reviewed appropriate hygiene for transitioning from lying to standing (stand slow, pump calf muscles, etc). 3.  Chronic diastolic CHF (congestive heart failure):  Volume appears stable.  We discussed the importance of daily weights to monitor for fluid excess and when to take extra Lasix.  4.  Coronary artery disease:  She notes atypical chest symptoms.  Myoview last year was low risk.  These have been stable.  I suspect they are related to tachycardia.  If she has more frequent symptoms, consider repeat stress testing.  She is not on ASA as she is on Eliquis.  She is statin intolerant. 5.  Essential hypertension:  Medication adjustments as noted above.  6.  COPD:  She sees Dr. Joya Gaskins next month.  I advised her to return sooner if she has recurrent hemoptysis.    Disposition:   FU with Truitt Merle, NP or me on a day Dr. Virl Axe is in the office in 2-3 weeks.    Signed, Versie Starks, MHS 12/11/2013 10:44 AM    [facility] [address], [place], [place]  [address] Phone: [phone]; Fax: [phone]

## 2013-12-11 NOTE — Patient Instructions (Signed)
CHANGE LASIX TO 20 MG ON MON, WED, and FRI'S  CHANGE POTASSIUM TO 20 MEQ ON MON, WED, and FRI'S  WEIGH DAILY AND IF YOUR WEIGHT GOES UP 3 LB'S OR MORE X 1 DAY OR MORE SWELLING OR MORE SOB; THEN OK TO TAKE EXTRA LASIX 20 MG  LAB WORK IN 1 WEEK FOR BMET, MAGNESIUM LEVEL Tuesday OR WED OF NEXT WEEK  PICK UP SOME OTC COMPRESSION STOCKINGS BEFORE STANDING MAKE SURE TO PUMP CALF MUSCLES AND MAKE SURE TO STAND SLOWLY. WHILE SITTING MAKE SURE TO KEEP LEGS ELEVATED  PLEASE FOLLOW UP IN 2-3 WEEKS WITH EITHER SCOTT WEAVER, PA OR LORI GERHARDT, NP SAME DAY DR. Caryl Comes IS IN THE OFFICE

## 2013-12-15 ENCOUNTER — Telehealth: Payer: Self-pay | Admitting: Physician Assistant

## 2013-12-15 DIAGNOSIS — H43811 Vitreous degeneration, right eye: Secondary | ICD-10-CM | POA: Diagnosis not present

## 2013-12-15 DIAGNOSIS — H35372 Puckering of macula, left eye: Secondary | ICD-10-CM | POA: Diagnosis not present

## 2013-12-15 NOTE — Telephone Encounter (Signed)
New message     Want monitor results 

## 2013-12-15 NOTE — Telephone Encounter (Signed)
s/w Shelly monitor tech who states monitor sent over to [facility] office for Dr. Doug Sou review and that she will receive a call from Dr. Doug Sou RN Malachy Mood. Pt said thank you. Pt also wanted to let me know to please let Richardson Dopp, PA know that her dizziness has gotten better since lasix decreased, though she does state just some minimal SOB which she has been taking lasix 20 mg for this, which she states is great as compared to the lasix 80 she was taking. She states that [employer], [place] was very through and she feels that he has really helped her. I told pt that I will let Nicki Reaper know this, pt said thank you.

## 2013-12-16 ENCOUNTER — Other Ambulatory Visit (INDEPENDENT_AMBULATORY_CARE_PROVIDER_SITE_OTHER): Payer: Medicare Other | Admitting: *Deleted

## 2013-12-16 DIAGNOSIS — I10 Essential (primary) hypertension: Secondary | ICD-10-CM

## 2013-12-16 DIAGNOSIS — R42 Dizziness and giddiness: Secondary | ICD-10-CM

## 2013-12-16 DIAGNOSIS — I5032 Chronic diastolic (congestive) heart failure: Secondary | ICD-10-CM | POA: Diagnosis not present

## 2013-12-16 DIAGNOSIS — I480 Paroxysmal atrial fibrillation: Secondary | ICD-10-CM | POA: Diagnosis not present

## 2013-12-16 LAB — BASIC METABOLIC PANEL
BUN: 14 mg/dL (ref 6–23)
CHLORIDE: 105 meq/L (ref 96–112)
CO2: 24 mEq/L (ref 19–32)
Calcium: 9.1 mg/dL (ref 8.4–10.5)
Creatinine, Ser: 1.1 mg/dL (ref 0.4–1.2)
GFR: 51.16 mL/min — AB (ref >60.00)
Glucose, Bld: 79 mg/dL (ref 70–99)
POTASSIUM: 3.9 meq/L (ref 3.5–5.1)
SODIUM: 141 meq/L (ref 135–145)

## 2013-12-16 LAB — MAGNESIUM: MAGNESIUM: 2.1 mg/dL (ref 1.5–2.5)

## 2013-12-16 NOTE — Telephone Encounter (Signed)
I'm glad she is doing better! Richardson Dopp, PA-C   12/16/2013 8:50 AM

## 2013-12-17 ENCOUNTER — Telehealth: Payer: Self-pay | Admitting: Cardiology

## 2013-12-17 ENCOUNTER — Other Ambulatory Visit: Payer: Self-pay | Admitting: *Deleted

## 2013-12-17 DIAGNOSIS — I5032 Chronic diastolic (congestive) heart failure: Secondary | ICD-10-CM

## 2013-12-17 NOTE — Telephone Encounter (Signed)
Pt calling to get monitor results from last Wednesday

## 2013-12-17 NOTE — Telephone Encounter (Signed)
Returned call to patient Dr.Jordan reviewed 48 hour holter monitor which revealed no atrial fib,no svt,nsr with rare pvc couplet,occasional pac's.Advised to keep appointment with Richardson Dopp PA 12/28/13 same day as when Dr.Klein in office.

## 2013-12-28 ENCOUNTER — Ambulatory Visit (INDEPENDENT_AMBULATORY_CARE_PROVIDER_SITE_OTHER): Payer: Medicare Other | Admitting: Physician Assistant

## 2013-12-28 ENCOUNTER — Encounter: Payer: Self-pay | Admitting: Physician Assistant

## 2013-12-28 ENCOUNTER — Other Ambulatory Visit (INDEPENDENT_AMBULATORY_CARE_PROVIDER_SITE_OTHER): Payer: Medicare Other | Admitting: *Deleted

## 2013-12-28 VITALS — BP 130/77 | HR 80 | Ht 66.0 in | Wt 153.0 lb

## 2013-12-28 DIAGNOSIS — R42 Dizziness and giddiness: Secondary | ICD-10-CM | POA: Diagnosis not present

## 2013-12-28 DIAGNOSIS — I10 Essential (primary) hypertension: Secondary | ICD-10-CM

## 2013-12-28 DIAGNOSIS — I5032 Chronic diastolic (congestive) heart failure: Secondary | ICD-10-CM

## 2013-12-28 DIAGNOSIS — I2510 Atherosclerotic heart disease of native coronary artery without angina pectoris: Secondary | ICD-10-CM | POA: Diagnosis not present

## 2013-12-28 DIAGNOSIS — I25810 Atherosclerosis of coronary artery bypass graft(s) without angina pectoris: Secondary | ICD-10-CM | POA: Diagnosis not present

## 2013-12-28 DIAGNOSIS — R300 Dysuria: Secondary | ICD-10-CM

## 2013-12-28 DIAGNOSIS — J449 Chronic obstructive pulmonary disease, unspecified: Secondary | ICD-10-CM | POA: Diagnosis not present

## 2013-12-28 DIAGNOSIS — I951 Orthostatic hypotension: Secondary | ICD-10-CM

## 2013-12-28 DIAGNOSIS — I480 Paroxysmal atrial fibrillation: Secondary | ICD-10-CM | POA: Diagnosis not present

## 2013-12-28 LAB — URINALYSIS WITH CULTURE, IF INDICATED
Bilirubin Urine: NEGATIVE
Hgb urine dipstick: NEGATIVE
Leukocytes, UA: NEGATIVE
NITRITE: NEGATIVE
SPECIFIC GRAVITY, URINE: 1.025 (ref 1.000–1.030)
TOTAL PROTEIN, URINE-UPE24: NEGATIVE
UROBILINOGEN UA: 0.2 (ref 0.0–1.0)
Urine Glucose: NEGATIVE
pH: 5.5 (ref 5.0–8.0)

## 2013-12-28 LAB — BASIC METABOLIC PANEL
BUN: 15 mg/dL (ref 6–23)
CO2: 26 mEq/L (ref 19–32)
Calcium: 9.4 mg/dL (ref 8.4–10.5)
Chloride: 100 mEq/L (ref 96–112)
Creatinine, Ser: 1.2 mg/dL (ref 0.4–1.2)
GFR: 48.12 mL/min — ABNORMAL LOW (ref >60.00)
Glucose, Bld: 88 mg/dL (ref 70–99)
Potassium: 4 mEq/L (ref 3.5–5.1)
Sodium: 138 mEq/L (ref 135–145)

## 2013-12-28 MED ORDER — METOPROLOL SUCCINATE ER 25 MG PO TB24: 12.5000 mg | ORAL_TABLET | Freq: Every day | ORAL | Status: DC

## 2013-12-28 MED ORDER — METOPROLOL TARTRATE 25 MG PO TABS: 12.5000 mg | ORAL_TABLET | Freq: Two times a day (BID) | ORAL | Status: DC

## 2013-12-28 MED ORDER — POTASSIUM CHLORIDE CRYS ER 20 MEQ PO TBCR: EXTENDED_RELEASE_TABLET | ORAL | Status: DC

## 2013-12-28 NOTE — Patient Instructions (Addendum)
LAB WORK TODAY; BMET, U/A W/CULTURE IF INDICATED  YOU HAVE BEEN GIVEN AN RX FOR TOPROL XL 25 MG TABLET; WITH DIRECTIONS TO TAKE 1/2 TAB DAILY = 12.5 MG DAILY  CHANGE LASIX AND POTASSIUM TO M, TU, W, FRI AND SAT  Your physician has requested that you have a lexiscan myoview. For further information please visit [email or web address]. Please follow instruction sheet, as given.  Your physician recommends that you schedule a follow-up appointment in: 02/2014 WITH DR. Caryl Comes ONCE YOUR MYOVIEW HAS BEEN COMPLETED

## 2013-12-28 NOTE — Progress Notes (Signed)
Cardiology Office Note   Date:  12/28/2013   ID:  Jodette, Wik Jul 04, 1934, MRN [id number]  PCP:  [name], MD  Cardiologist:  Dr. Peter Martinique   Electrophysiologist:  Dr. Virl Axe    History of Present Illness: Renee [name] is a 79 y.o. female with a hx of PAF, CAD s/p CABG 06/4654, diastolic HF, COPD, RBBB, HTN, HL.  Renee [name] has had recurrent PAF with RVR.  Renee [name] has been intolerant of Amiodarone in the past.  Renee [name] was admitted 11/2013 for Tikosyn load - dose was reduced to 250 mcg >> 125 mcg due to long QT (QTc 530 ms).  After DC, Renee [name] complained of dizziness and cough.  Renee [name] was noted to have orthostatic hypotension.  Metoprolol was DC'd and Lasix was reduced.  Renee [name] was then admitted 10/26-10/28 with continued dizziness in the setting of orthostatic hypotension. Metoprolol was resumed upon admission but resulted in bradycardia. This resolved with discontinuation. Renee [name] remained on diltiazem and Tikosyn. Renee [name] was hydrated with IV fluids and Lasix was held. Lasix was resumed at discharge. Renee [name] was scheduled for 48 Hr Holter after DC.  This was reviewed by Dr. Martinique and revealed no atrial fib, no svt, nsr with rare pvc couplet, occasional pac's.    I saw Renee [name] 11/06.  Renee [name] was still struggling with dizziness.  Renee [name] was orthostatic with BP 130/79 lying >>> 111/70 standing (HR 90 >>> 119). I adjusted Renee [name] Lasix and recommended compression stockings.    Renee [name] returns for FU.  Renee [name] dizziness is much improved.  Renee [name] continues to note DOE.  This is fairly stable.  But, Renee [name] does note fatigue.  Renee [name] had an episode of chest pain recently as well. This was at rest and only lasted a couple minutes. No assoc symptoms.  Renee [name] sleeps on 3 pillows chronically.  Renee [name] denies PND or edema. Renee [name] has taken a few extra Lasix since last seen.  This improved Renee [name] dyspnea.  Renee [name] continues to note rapid palpitations.       Studies:  - LHC (5/11):  Left main <10%,, LAD bifurcation 70%, proximal circumflex 20-30%, RCA <  20%, EF 60% >> CABG (LIMA-LAD, SVG-DX)  - Echo (10/15):  EF 50-55%, normal wall motion, grade 1 diastolic dysfunction, mild TR  - Nuclear (4/14):  Normal stress nuclear study.  LV Ejection Fraction: 78%   Recent Labs/Images:  11/30/2013: Hemoglobin 14.9; Pro B Natriuretic peptide (BNP) 250.9 12/16/2013: BUN 14; Creatinine 1.1; Potassium 3.9; Sodium 141    Dg Chest 2 View  11/30/2013    IMPRESSION: Persistent bibasilar atelectasis greater on LEFT.   Electronically Signed   By: Lavonia Dana M.D.   On: 11/30/2013 16:09     Wt Readings from Last 3 Encounters:  12/28/13 153 lb (69.4 kg)  12/11/13 153 lb (69.4 kg)  12/02/13 156 lb 8.4 oz (71 kg)     Past Medical History  Diagnosis Date  . GERD (gastroesophageal reflux disease)   . IBS (irritable bowel syndrome)   . Bundle branch block, right   . Coronary artery disease     a. CABG 09/2009    b. normal myoview in 05/2012  . Anxiety   . AF (atrial fibrillation)     a. Post op after CABG; off amiodarone  . HTN (hypertension)   . Chronic diastolic CHF (congestive heart failure)   . Dysphagia   . Colon polyps   . Hypertension   . HLD (hyperlipidemia)   . Hypothyroidism   .  Palpitations   . Chronic bronchitis   . Emphysema/COPD   . Sciatic nerve pain     Current Outpatient Prescriptions  Medication Sig Dispense Refill  . Aclidinium Bromide (TUDORZA PRESSAIR) 400 MCG/ACT AEPB Inhale 1 puff into the lungs 2 (two) times daily. 3 each 2  . ALPRAZolam (XANAX) 0.25 MG tablet Take 0.25 mg by mouth 2 (two) times daily as needed for anxiety or sleep.    [name] apixaban (ELIQUIS) 5 MG TABS tablet Take 1 tablet (5 mg total) by mouth 2 (two) times daily. 180 tablet 1  . cholecalciferol (VITAMIN D) 1000 UNITS tablet Take 1,000 Units by mouth daily.    [name] diltiazem (CARDIZEM CD) 240 MG 24 hr capsule Take 1 capsule (240 mg total) by mouth daily. 90 capsule 1  . dofetilide (TIKOSYN) 125 MCG capsule Take 1 capsule (125 mcg total) by mouth 2 (two) times  daily. 14 capsule 0  . esomeprazole (NEXIUM) 40 MG capsule Take 40 mg by mouth daily before breakfast.      . furosemide (LASIX) 40 MG tablet Take 20 mg by mouth as directed. TAKE 20 MG ON M, W, and FRI'S    . levalbuterol (XOPENEX HFA) 45 MCG/ACT inhaler Inhale 2 puffs into the lungs every 6 (six) hours as needed for wheezing.    [name] levothyroxine (SYNTHROID, LEVOTHROID) 25 MCG tablet Take 25 mcg by mouth daily before breakfast.     . nitroGLYCERIN (NITROSTAT) 0.4 MG SL tablet Place 0.4 mg under the tongue every 5 (five) minutes as needed for chest pain.    [name] Glycol-Propyl Glycol (SYSTANE OP) Place 1 drop into both eyes daily.     . potassium chloride SA (K-DUR,KLOR-CON) 20 MEQ tablet TAKE 20 MEQ ON M, W, and FRI'S    . traMADol (ULTRAM) 50 MG tablet Take 1 tablet (50 mg total) by mouth every 6 (six) hours as needed. Pain 30 tablet 0  . [DISCONTINUED] budesonide-formoterol (SYMBICORT) 160-4.5 MCG/ACT inhaler Inhale 2 puffs into the lungs 2 (two) times daily. 1 Inhaler 12  . [DISCONTINUED] calcium citrate-vitamin D (CITRACAL+D) 315-200 MG-UNIT per tablet Take 1 tablet by mouth 2 (two) times daily.       No current facility-administered medications for this visit.     Allergies:   Spiriva; Statins; Levofloxacin; Budesonide-formoterol fumarate; Morphine; Penicillins; Phenazopyridine hcl; Pneumococcal vaccine; Pneumococcal vaccines; Prednisone; Prednisone; Sulfonamide derivatives; Tylenol; Penicillins; and Sulfa antibiotics   Social History:  The Renee [name]  reports that Renee [name] quit smoking about 4 years ago. Renee [name] smoking use included Cigarettes. Renee [name] has a 50 pack-year smoking history. Renee [name] has never used smokeless tobacco. Renee [name] reports that Renee [name] does not drink alcohol or use illicit drugs.   Family History:  The Renee [name]'s family history includes Breast cancer in Renee [name] sister; Cancer in Renee [name] sister; Colon cancer in Renee [name] daughter; Colon polyps in Renee [name] brother; Deep vein thrombosis in Renee [name] mother; Diabetes in  Renee [name] father; Heart attack in Renee [name] father; Heart disease in Renee [name] father and mother; Hyperlipidemia in Renee [name] father; Hypertension in Renee [name] brother, father, and mother; Other in Renee [name] sister; Transient ischemic attack in Renee [name] mother.   ROS:  Please see the history of present illness.  Renee [name] notes dysuria.  Renee [name] questions if Renee [name] has a yeast infection.  All other systems reviewed and negative.    PHYSICAL EXAM: VS:  BP 130/77 mmHg  Pulse 80  Ht 5\' 6"  (1.676 m)  Wt 153 lb (69.4 kg)  BMI 24.71 kg/m2 Well nourished, well developed, in no acute  distress HEENT: normal Neck: no JVD Cardiac:  normal S1, S2; RRR; no murmur Lungs:  Decreased breath sounds bilaterally, no wheezing, rhonchi or rales Abd: soft, nontender, no hepatomegaly Ext:  no edema Skin: warm and dry Neuro:  CNs 2-12 intact, no focal abnormalities noted  EKG:  NSR, HR 80, RBBB, QTc 491 ms   ASSESSMENT AND PLAN:  1.  Paroxysmal a-fib:  Maintaining NSR today.  Continue Tikosyn and Eliquis.  Obtain FU BMET today.  Renee [name] continues to note palpitations.  Renee [name] is likely experiencing symptomatic PACs.  Will resume low dose beta blocker with Toprol-XL 12.5 mg QD.  Arrange Myoview as noted below and FU with Dr. Virl Axe thereafter.  Of note, we did discuss whether or not Renee [name] should FU with Dr. Thompson Grayer as Renee [name] has had some increased symptoms since starting Tikosyn (ie consider PVI ablation).  For now, Renee [name] will remain on Dr. Olin Pia schedule for FU. 2.  Dizziness:  As noted, at last visit Renee [name] was orthostatic with BP 130/79 lying >>> 111/70 standing (HR 90 >>> 119). Lasix was adjusted.  Symptoms are much improved.   3.  Chronic diastolic CHF (congestive heart failure):  Renee [name] has been taking additional Lasix.  I will adjust Renee [name] regimen to Lasix on Mon, Tues, Wed, Fri, Sat only.  Renee [name] can take extra for weight gain.  Check BMET today for FU on renal fxn and K+.   4.  Coronary artery disease:   Myoview last year was low risk.  Renee [name] is not on ASA as Renee [name] is on  Eliquis.  Renee [name] is statin intolerant.  Renee [name] notes fatigue and recent episode of chest pain.  I suspect Renee [name] symptoms are multifactorial.  However, I cannot completely rule out progression of CAD.    -  Arrange Lexiscan Myoview. 5.  Essential hypertension:  Controlled.  6.  COPD:  FU with Dr. Joya Gaskins as planned.       Disposition:   FU with Dr. Virl Axe after Renee [name] stress test.     Signed, Richardson Dopp, PA-C, MHS 12/28/2013 9:45 AM    [facility] [place], [place], [place]  [address] Phone: [phone]; Fax: [phone]

## 2013-12-29 ENCOUNTER — Telehealth: Payer: Self-pay | Admitting: *Deleted

## 2013-12-29 NOTE — Telephone Encounter (Signed)
pt notified about lab results with verbal understanding to results and was advised ok to try otc prep for yeast infection if this is what she feels may be going on, if not better to f/u w/PCP, pt said ok and thank you.

## 2014-01-02 ENCOUNTER — Other Ambulatory Visit: Payer: Self-pay | Admitting: Physician Assistant

## 2014-01-02 DIAGNOSIS — I5032 Chronic diastolic (congestive) heart failure: Secondary | ICD-10-CM | POA: Diagnosis not present

## 2014-01-06 ENCOUNTER — Ambulatory Visit (INDEPENDENT_AMBULATORY_CARE_PROVIDER_SITE_OTHER): Payer: Medicare Other | Admitting: Critical Care Medicine

## 2014-01-06 ENCOUNTER — Encounter: Payer: Self-pay | Admitting: Critical Care Medicine

## 2014-01-06 VITALS — BP 118/84 | HR 111 | Ht 66.0 in | Wt 158.0 lb

## 2014-01-06 DIAGNOSIS — I5032 Chronic diastolic (congestive) heart failure: Secondary | ICD-10-CM

## 2014-01-06 DIAGNOSIS — I2510 Atherosclerotic heart disease of native coronary artery without angina pectoris: Secondary | ICD-10-CM

## 2014-01-06 DIAGNOSIS — J418 Mixed simple and mucopurulent chronic bronchitis: Secondary | ICD-10-CM | POA: Diagnosis not present

## 2014-01-06 MED ORDER — FUROSEMIDE 20 MG PO TABS: ORAL_TABLET | ORAL | Status: DC

## 2014-01-06 MED ORDER — POTASSIUM CHLORIDE ER 20 MEQ PO TBCR: 40.0000 meq | EXTENDED_RELEASE_TABLET | Freq: Every day | ORAL | Status: DC

## 2014-01-06 NOTE — Progress Notes (Signed)
Subjective:    Patient ID: Renee [name], female    DOB: [birth year], 79 y.o.   MRN: [id number]  HPI 79 yo female with known hx of COPD    Acute OV  Complains of prod cough with green mucus, some head congestion with runny nose w/ clear drainage, sinus pressure, PND, increased SOB, wheezing x1 week.  Had some symptoms 3 weeks ago, took zpack with some help but never went totally away.  Denies any tightness, fever, hemoptysis, chest pain ,  She remains on Tudorza.  Was admitted last week with Atrial Fib with Tikosyn initiation, since then with some intermittent dizziness. No headache, visual /speech changes, no arm weakness.  .   01/06/2014 Chief Complaint  Patient presents with  . Follow-up    productive cough greenish  started to have problems carrying the oxygen tank   Cough prod green, still diff time with oxygen.  Notes some chest pain.  Pt in hosp for tikosyn afib, then got volume depleted on lasix and K low.  Pt was in Ed.  Pt has nuc stress test and cards f/u Notes edema in feet.  Weight is up 158 (5#).     Review of Systems Constitutional:   No  weight loss, night sweats,  Fevers, chills,  +fatigue, or  lassitude.  HEENT:   No headaches,  Difficulty swallowing,  Tooth/dental problems, or  Sore throat,                No sneezing, itching, ear ache,  +nasal congestion, post nasal drip,   CV:  No chest pain,  Orthopnea, PND, swelling in lower extremities, anasarca, dizziness, palpitations, syncope.   GI  No heartburn, indigestion, abdominal pain, nausea, vomiting, diarrhea, change in bowel habits, loss of appetite, bloody stools.   Resp:  .  No chest wall deformity  Skin: no rash or lesions.  GU: no dysuria, change in color of urine, no urgency or frequency.  No flank pain, no hematuria   MS:  No joint pain or swelling.  No decreased range of motion.  No back pain.  Psych:  No change in mood or affect. No depression or anxiety.  No memory loss.           Objective:   Physical Exam BP 118/84 mmHg  Pulse 111  Ht 5\' 6"  (1.676 m)  Wt 158 lb (71.668 kg)  BMI 25.51 kg/m2  SpO2 96%  GEN: A/Ox3; pleasant , NAD, elderly   HEENT:  [place]/AT,  EACs-clear, TMs-wnl, NOSE-clear drainage  THROAT-clear, no lesions, no postnasal drip or exudate noted.   NECK:  Supple w/ fair ROM; no JVD; normal carotid impulses w/o bruits; no thyromegaly or nodules palpated; no lymphadenopathy.  RESP  Decreased BS in bases , no accessory muscle use, no dullness to percussion  CARD:  RRR, no m/r/g  , no peripheral edema, pulses intact, no cyanosis or clubbing.  GI:   Soft & nt; nml bowel sounds; no organomegaly or masses detected.  Musco: Warm bil, no deformities or joint swelling noted.   Neuro: alert, no focal deficits noted.    Skin: Warm, no lesions or rashes   amb sats 99%.  HR 110-130 with min walking      Assessment & Plan:   COPD (chronic obstructive pulmonary disease) COPD with recent exacerbation worsened with fluid excess from reduction in Lasix Plan Increase potassium to 48meq daily Increase lasix to 40mg  daily No change in inhaler Continue oxygen 2 L continuous  and at red Return 2 months    Updated Medication List Outpatient Encounter Prescriptions as of 01/06/2014  Medication Sig  . Aclidinium Bromide (TUDORZA PRESSAIR) 400 MCG/ACT AEPB Inhale 1 puff into the lungs 2 (two) times daily.  [name] ALPRAZolam (XANAX) 0.25 MG tablet Take 0.25 mg by mouth 2 (two) times daily as needed for anxiety or sleep.  [name] apixaban (ELIQUIS) 5 MG TABS tablet Take 1 tablet (5 mg total) by mouth 2 (two) times daily.  . cholecalciferol (VITAMIN D) 1000 UNITS tablet Take 1,000 Units by mouth daily.  [name] diltiazem (CARDIZEM CD) 240 MG 24 hr capsule Take 1 capsule (240 mg total) by mouth daily.  [name] dofetilide (TIKOSYN) 125 MCG capsule Take 1 capsule (125 mcg total) by mouth 2 (two) times daily.  [name] esomeprazole (NEXIUM) 40 MG capsule Take 40 mg by mouth daily before  breakfast.    . levalbuterol (XOPENEX HFA) 45 MCG/ACT inhaler Inhale 2 puffs into the lungs every 6 (six) hours as needed for wheezing.  [name] levothyroxine (SYNTHROID, LEVOTHROID) 25 MCG tablet Take 25 mcg by mouth daily before breakfast.   . metoprolol succinate (TOPROL-XL) 25 MG 24 hr tablet Take 0.5 tablets (12.5 mg total) by mouth daily.  . nitroGLYCERIN (NITROSTAT) 0.4 MG SL tablet Place 0.4 mg under the tongue every 5 (five) minutes as needed for chest pain.  [name] Glycol-Propyl Glycol (SYSTANE OP) Place 1 drop into both eyes daily.   . traMADol (ULTRAM) 50 MG tablet Take 1 tablet (50 mg total) by mouth every 6 (six) hours as needed. Pain  . [DISCONTINUED] furosemide (LASIX) 40 MG tablet Take 20 mg by mouth as directed. TAKE 20 MG ON M, TU, W, FRI and SAT  . [DISCONTINUED] potassium chloride SA (K-DUR,KLOR-CON) 20 MEQ tablet TAKE 20 MEQ ON M, TU, W, FRI and SAT  . furosemide (LASIX) 20 MG tablet Take 40mg  daily  . potassium chloride 20 MEQ TBCR Take 40 mEq by mouth daily.

## 2014-01-06 NOTE — Patient Instructions (Signed)
Increase potassium to 29meq daily Increase lasix to 40mg  daily No change in inhaler I will notify cardiology of this change You may be off oxygen daytime, use 2Liters at bedtime Return 2 months

## 2014-01-07 ENCOUNTER — Telehealth: Payer: Self-pay | Admitting: Critical Care Medicine

## 2014-01-07 NOTE — Telephone Encounter (Signed)
This is different from the office but in any case i would STAY on 2L exertion

## 2014-01-07 NOTE — Telephone Encounter (Signed)
Pt states that she was taken off her 02 yesterday at visit with PW.  Went shopping yesterday with granddaughter without her 70, states that her pulse ox was showing her 02 down at 88%, pt was feeling very winded and fatigued at that point.  Pt isn't sure if she should be coming off her 02 with exertion.  Dr. Annamaria Boots please advise.  Thank you.

## 2014-01-07 NOTE — Telephone Encounter (Signed)
[phone] returning call

## 2014-01-07 NOTE — Telephone Encounter (Signed)
Called spoke with pt. Aware of recs.  Nothing further needed 

## 2014-01-07 NOTE — Telephone Encounter (Signed)
lmomtcb x1 

## 2014-01-07 NOTE — Assessment & Plan Note (Signed)
COPD with recent exacerbation worsened with fluid excess from reduction in Lasix Plan Increase potassium to 52meq daily Increase lasix to 40mg  daily No change in inhaler Continue oxygen 2 L continuous and at red Return 2 months

## 2014-01-12 ENCOUNTER — Telehealth: Payer: Self-pay | Admitting: *Deleted

## 2014-01-12 DIAGNOSIS — I5032 Chronic diastolic (congestive) heart failure: Secondary | ICD-10-CM

## 2014-01-12 NOTE — Telephone Encounter (Signed)
Line busy, will try later.

## 2014-01-13 NOTE — Telephone Encounter (Signed)
s/w Renee [name] in regards to Dr. Joya Gaskins increasing lasix and that she will need to have lab work, bmet. Renee [name] states cannot come in for lab work until 12/16. Renee [name] did state "she does not want anything changed with her meds since she feels better than she has awhile".

## 2014-01-18 ENCOUNTER — Ambulatory Visit (INDEPENDENT_AMBULATORY_CARE_PROVIDER_SITE_OTHER): Payer: Medicare Other | Admitting: Physician Assistant

## 2014-01-18 ENCOUNTER — Telehealth: Payer: Self-pay | Admitting: Nurse Practitioner

## 2014-01-18 ENCOUNTER — Telehealth: Payer: Self-pay | Admitting: *Deleted

## 2014-01-18 ENCOUNTER — Encounter: Payer: Self-pay | Admitting: Physician Assistant

## 2014-01-18 VITALS — BP 142/76 | HR 80 | Ht 66.0 in | Wt 155.0 lb

## 2014-01-18 DIAGNOSIS — I480 Paroxysmal atrial fibrillation: Secondary | ICD-10-CM

## 2014-01-18 DIAGNOSIS — I5032 Chronic diastolic (congestive) heart failure: Secondary | ICD-10-CM | POA: Diagnosis not present

## 2014-01-18 DIAGNOSIS — R002 Palpitations: Secondary | ICD-10-CM | POA: Diagnosis not present

## 2014-01-18 DIAGNOSIS — I10 Essential (primary) hypertension: Secondary | ICD-10-CM

## 2014-01-18 DIAGNOSIS — I25810 Atherosclerosis of coronary artery bypass graft(s) without angina pectoris: Secondary | ICD-10-CM | POA: Diagnosis not present

## 2014-01-18 DIAGNOSIS — I2510 Atherosclerotic heart disease of native coronary artery without angina pectoris: Secondary | ICD-10-CM | POA: Diagnosis not present

## 2014-01-18 DIAGNOSIS — I951 Orthostatic hypotension: Secondary | ICD-10-CM | POA: Diagnosis not present

## 2014-01-18 DIAGNOSIS — J449 Chronic obstructive pulmonary disease, unspecified: Secondary | ICD-10-CM | POA: Diagnosis not present

## 2014-01-18 LAB — BASIC METABOLIC PANEL
BUN: 10 mg/dL (ref 6–23)
CHLORIDE: 99 meq/L (ref 96–112)
CO2: 28 mEq/L (ref 19–32)
Calcium: 9.1 mg/dL (ref 8.4–10.5)
Creatinine, Ser: 1.1 mg/dL (ref 0.4–1.2)
GFR: 53.97 mL/min — AB (ref >60.00)
Glucose, Bld: 99 mg/dL (ref 70–99)
POTASSIUM: 3.7 meq/L (ref 3.5–5.1)
Sodium: 136 mEq/L (ref 135–145)

## 2014-01-18 LAB — CBC WITH DIFFERENTIAL/PLATELET
BASOS ABS: 0.1 10*3/uL (ref 0.0–0.1)
BASOS PCT: 0.6 % (ref 0.0–3.0)
EOS ABS: 0.1 10*3/uL (ref 0.0–0.7)
Eosinophils Relative: 1.3 % (ref 0.0–5.0)
HCT: 41.4 % (ref 36.0–46.0)
Hemoglobin: 13.1 g/dL (ref 12.0–15.0)
Lymphocytes Relative: 21.6 % (ref 12.0–46.0)
Lymphs Abs: 2.1 10*3/uL (ref 0.7–4.0)
MCHC: 31.8 g/dL (ref 30.0–36.0)
MCV: 86.9 fl (ref 78.0–100.0)
MONO ABS: 1.2 10*3/uL — AB (ref 0.1–1.0)
Monocytes Relative: 12.3 % — ABNORMAL HIGH (ref 3.0–12.0)
NEUTROS PCT: 64.2 % (ref 43.0–77.0)
Neutro Abs: 6.1 10*3/uL (ref 1.4–7.7)
Platelets: 293 10*3/uL (ref 150.0–400.0)
RBC: 4.76 Mil/uL (ref 3.87–5.11)
RDW: 15.7 % — AB (ref 11.5–15.5)
WBC: 9.5 10*3/uL (ref 4.0–10.5)

## 2014-01-18 LAB — TSH: TSH: 1.98 u[IU]/mL (ref 0.35–4.50)

## 2014-01-18 MED ORDER — POTASSIUM CHLORIDE ER 20 MEQ PO TBCR: 20.0000 meq | EXTENDED_RELEASE_TABLET | Freq: Every day | ORAL | Status: DC

## 2014-01-18 MED ORDER — POTASSIUM CHLORIDE ER 20 MEQ PO TBCR: 40.0000 meq | EXTENDED_RELEASE_TABLET | Freq: Every day | ORAL | Status: DC

## 2014-01-18 NOTE — Progress Notes (Addendum)
[place], [place], [place]  [address] Phone: [phone] Fax:  [phone]  Date:  01/18/2014   Patient ID:  Renee [name], Renee [name] 1934-03-06, MRN [id number]   PCP:  [name], MD  Cardiologist: Dr. Peter Martinique  Electrophysiologist: Dr. Virl Axe    History of Present Illness: Renee [name] is a 79 y.o. female with a hx of PAF, CAD s/p CABG 10/3901, chronic diastolic HF, COPD, RBBB, HTN, HL.She has had recurrent PAF with RVR. She has been intolerant of Amiodarone in the past. She was admitted 11/2013 for Tikosyn load - dose was reduced to 250 mcg >> 125 mcg due to long QT (QTc 530 ms).After DC, patient complained of dizziness and cough and was noted to have orthostatic hypotension.Metoprolol was DC'd and Lasix was reduced.She was then admitted 10/26-10/28 with continued dizziness in the setting of orthostatic hypotension. Metoprolol was resumed upon admission but resulted in nocturnal bradycardia. This resolved with discontinuation. She remained on diltiazem and Tikosyn. She was hydrated with IV fluids and Lasix was held. Lasix was resumed at discharge. She was scheduled for 48 Hr Holter after DC. This was reviewed by Dr. Martinique and revealed no atrial fib, no SVT, NSR with rare PVC couplet, occasional pac's.She has been seen in follow-up by Richardson Dopp twice since that time with improved dizziness following medication adjustment. She reported an episode of chest pain as well as fatigue and nuclear stress testing was recommended. She was also noting palpitations felt likely due to symptomatic PACs and low dose beta blocker was re-added. She was seen by Dr. Joya Gaskins 01/07/14 at which time he felt she had had COPD with recent exacerbation worsened with fluid excess from reduction in Lasix. He increased Lasix to 40mg  and KCl to 54meq. However, she didn't realize she was supposed to change potassium and was only taking 1meq daily.  She was added onto the flex  schedule for evaluation of palpitations. Last week she had some of the best days she's had in a long time. However, since the weekend (01/16/14) she has noticed intermittent racing heart around 110bpm. This is associated with increased cardiac awareness. She gets nervous when she feels it. This does not feel like prior angina. She denies overt chest pain, dyspnea, LEE, orthopnea, weight gain, near syncope or syncope. She has noticed a headache and sinus congestion for the last several days but has not taken anything over the counter. No cough or fever. She took a Xanax this AM and this helped to bring HR down. She was not having palpitations at time of EKG today when she was NSR. She has not had any further symptoms of orthostasis. She is very frustrated about having recurrent palpitations on Tikosyn. Her fatigue persists.   Studies: - LHC (5/11): Left main <10%,, LAD bifurcation 70%, proximal circumflex 20-30%, RCA < 20%, EF 60% >> CABG (LIMA-LAD, SVG-DX) - Echo (10/15): EF 50-55%, normal wall motion, grade 1 diastolic dysfunction, mild TR, normal RV, normal RVSP - Nuclear (4/14): Normal stress nuclear study. LV Ejection Fraction: 78%  Recent Labs: 11/30/2013: Hemoglobin 14.9; Pro B Natriuretic peptide (BNP) 250.9 12/28/2013: Creatinine 1.2; Potassium 4.0  Wt Readings from Last 3 Encounters:  01/18/14 155 lb (70.308 kg)  01/06/14 158 lb (71.668 kg)  12/28/13 153 lb (69.4 kg)     Past Medical History  Diagnosis Date  . GERD (gastroesophageal reflux disease)   . IBS (irritable bowel syndrome)   . RBBB   . Coronary artery  disease     a. CABG 09/2009. b. normal myoview in 05/2012.   [name] Anxiety   . PAF (paroxysmal atrial fibrillation)     a. Post op after CABG. Intolerant of amio in the past. b. Recurrence - started on Tikosyn 11/2013.  [name] Chronic diastolic CHF (congestive heart failure)   . Dysphagia   . Colon polyps   . Hypertension   . HLD (hyperlipidemia)   . Hypothyroidism   .  Chronic bronchitis   . Emphysema/COPD   . Sciatic nerve pain   . Orthostatic hypotension   . PVC's (premature ventricular contractions)     a. Holter 11/2013: no AF, no SVT, rare PVC couplet, occasional PACs.  . Premature atrial contractions     a. Holter 11/2013: no AF, no SVT, rare PVC couplet, occasional PACs.  . Chronic respiratory failure   . Bradycardia     Current Outpatient Prescriptions  Medication Sig Dispense Refill  . Aclidinium Bromide (TUDORZA PRESSAIR) 400 MCG/ACT AEPB Inhale 1 puff into the lungs 2 (two) times daily. 3 each 2  . ALPRAZolam (XANAX) 0.25 MG tablet Take 0.25 mg by mouth 2 (two) times daily as needed for anxiety or sleep.    [name] apixaban (ELIQUIS) 5 MG TABS tablet Take 1 tablet (5 mg total) by mouth 2 (two) times daily. 180 tablet 1  . cholecalciferol (VITAMIN D) 1000 UNITS tablet Take 1,000 Units by mouth daily.    [name] diltiazem (CARDIZEM CD) 240 MG 24 hr capsule Take 1 capsule (240 mg total) by mouth daily. 90 capsule 1  . dofetilide (TIKOSYN) 125 MCG capsule Take 1 capsule (125 mcg total) by mouth 2 (two) times daily. 14 capsule 0  . esomeprazole (NEXIUM) 40 MG capsule Take 40 mg by mouth daily before breakfast.      . furosemide (LASIX) 20 MG tablet Take 40mg  daily (Patient taking differently: Take 40 mg by mouth daily. Take 40mg  daily) 60 tablet 11  . levalbuterol (XOPENEX HFA) 45 MCG/ACT inhaler Inhale 2 puffs into the lungs every 6 (six) hours as needed for wheezing.    [name] levothyroxine (SYNTHROID, LEVOTHROID) 25 MCG tablet Take 25 mcg by mouth daily before breakfast.     . metoprolol succinate (TOPROL-XL) 25 MG 24 hr tablet Take 0.5 tablets (12.5 mg total) by mouth daily. 15 tablet 6  . nitroGLYCERIN (NITROSTAT) 0.4 MG SL tablet Place 0.4 mg under the tongue every 5 (five) minutes as needed for chest pain.    [name] Glycol-Propyl Glycol (SYSTANE OP) Place 1 drop into both eyes daily.     . Potassium Chloride ER 20 MEQ TBCR Take 20 mEq by mouth daily. 60  tablet   . traMADol (ULTRAM) 50 MG tablet Take 1 tablet (50 mg total) by mouth every 6 (six) hours as needed. Pain 30 tablet 0  . [DISCONTINUED] budesonide-formoterol (SYMBICORT) 160-4.5 MCG/ACT inhaler Inhale 2 puffs into the lungs 2 (two) times daily. 1 Inhaler 12  . [DISCONTINUED] calcium citrate-vitamin D (CITRACAL+D) 315-200 MG-UNIT per tablet Take 1 tablet by mouth 2 (two) times daily.       No current facility-administered medications for this visit.    Allergies:   Spiriva; Statins; Levofloxacin; Budesonide-formoterol fumarate; Morphine; Penicillins; Phenazopyridine hcl; Pneumococcal vaccine; Pneumococcal vaccines; Prednisone; Prednisone; Sulfonamide derivatives; Tylenol; Penicillins; and Sulfa antibiotics   Social History:  The patient  reports that she quit smoking about 4 years ago. Her smoking use included Cigarettes. She has a 50 pack-year smoking history. She has never used  smokeless tobacco. She reports that she does not drink alcohol or use illicit drugs.   Family History:  The patient's family history includes Breast cancer in her sister; Cancer in her sister; Colon cancer in her daughter; Colon polyps in her brother; Deep vein thrombosis in her mother; Diabetes in her father; Heart attack in her father; Heart disease in her father and mother; Hyperlipidemia in her father; Hypertension in her brother, father, and mother; Other in her sister; Transient ischemic attack in her mother.   ROS:  Please see the history of present illness.  She has slight pain at the proximal edge of a mole that she irritated with pulling down her underwear that she plans to see PCP for. All other systems reviewed and negative.   PHYSICAL EXAM:  VS:  BP 142/76 mmHg  Pulse 80  Ht 5\' 6"  (1.676 m)  Wt 155 lb (70.308 kg)  BMI 25.03 kg/m2 Well nourished, well developed WF in no acute distress HEENT: normal Neck: no JVD Cardiac:  normal S1, S2; RRR; no murmur Lungs:  clear to auscultation bilaterally, no  wheezing, rhonchi or rales Abd: soft, nontender, no hepatomegaly Ext: no edema Skin: warm and dry Neuro:  moves all extremities spontaneously, no focal abnormalities noted  EKG:  NSR 94bpm, incomplete RBBB, QTc 502 (in setting of QRS 135ms), comparable to prior tracings - reviewed with Dr. Mare Ferrari. No ectopy  ASSESSMENT AND PLAN:  1. Palpitations 2. H/o PAF, on Tikosyn, with h/o bradycardia 3. H/o orthostatic hypotension 4. CAD s/p CABG 2011 - recent nuc ordered for fatigue/chest pain, pending completion 5. Chronic diastolic CHF 6. COPD with chronic respiratory failure, followed by Dr. Joya Gaskins  She seems very frustrated at the recurrence of symptomatic palpitations. Holter in 12/2013 showed PACs/PVCs. She does not remember having racing heart while wearing this. I offered her repeat Holter monitoring to confirm recurrence of atrial fib (and exclude SVT, atrial flutter, or ventricular arrhythmias) but she declined. Since she has not had any issues recently with orthostasis and BP is satisfactory, I recommended to consider increasing Toprol to 12.5mg  BID but she declined this as well. She did not want to make any medication changes but does wish to talk to Dr. Caryl Comes about eventually coming off Tikosyn. She is OK with checking basic labs today including BMET (she was taking wrong dose of KCl), CBC (to assess for anemia/infection), and TSH. For now she wants to continue to observe her palpitations. Hopefully they will improve with resolution of her mild nasal symptoms. She knows to avoid OTC sinus preparations. She sees her PCP for this tomorrow, along with assessment of a mole on her abdomen. She now wants to move her nuclear stress test up - she was previously trying to defer this until after the holiday. Will try to get her back into see Dr. Caryl Comes sooner than next month.  Dispo: Will request for her nuclear stress test and appointment with Dr. Caryl Comes to be moved up.   Signed, Renee Copa, PA-C    01/18/2014 2:19 PM

## 2014-01-18 NOTE — Telephone Encounter (Signed)
New message      Talk to [place].  Pt is on tikosyn and she does not think it is working.  Pt would not give more info.

## 2014-01-18 NOTE — Telephone Encounter (Signed)
I called Renee [name] to see about Tikosyn not working per Renee [name]. Renee [name] states she has increase sob, chest pressure and feels like "heart if flipping over". I then d/w Richardson Dopp, PA who states she should be seen on the Flex schedule today. I gave her Melina Copa, [place] name x 3 and she is aware she will be seeing her today. Renee [name] said ok.  Renee [name] has appt to see Dr. Caryl Comes 02/26/14 however; Renee [name] states she does not feel she can wait that long. Renee [name] states she feels like she is getting bounced around and that she wants to Truitt Merle, NP. I explained to Renee [name] that the PA's and NP are to help out the doctor and though she likes Truitt Merle, NP she has seen Richardson Dopp, PA the last few times just because how the scheduled had worked out to have her seen in the office. Renee [name] said seemed to understand a little better.

## 2014-01-18 NOTE — Patient Instructions (Signed)
Your physician recommends that you continue on your current medications as directed. Please refer to the Current Medication list given to you today.  Lab Today: Bmet, Cbc, Tsh  Your physician has requested that you have en exercise stress myoview. For further information please visit [email or web address]. Please follow instruction sheet, as given. (PLEASE MOVE SCHEDULED NUCLEAR APPT UP)  Your physician recommends that you schedule a follow-up appointment WITH DR.KLEIN FIRST AVAILABLE AFTER MYOVIEW

## 2014-01-18 NOTE — Telephone Encounter (Signed)
pt notified about lab results and to make sure she takes K+ 40 daily. Pt said she was not going to get lab work next week, that she will get it on 12/28 when she comes in for [facility]. Melina Copa, PA said ok to lab 12/28.

## 2014-01-19 DIAGNOSIS — J069 Acute upper respiratory infection, unspecified: Secondary | ICD-10-CM | POA: Diagnosis not present

## 2014-01-19 DIAGNOSIS — J449 Chronic obstructive pulmonary disease, unspecified: Secondary | ICD-10-CM | POA: Diagnosis not present

## 2014-01-19 DIAGNOSIS — R05 Cough: Secondary | ICD-10-CM | POA: Diagnosis not present

## 2014-01-19 DIAGNOSIS — J309 Allergic rhinitis, unspecified: Secondary | ICD-10-CM | POA: Diagnosis not present

## 2014-01-19 DIAGNOSIS — I480 Paroxysmal atrial fibrillation: Secondary | ICD-10-CM | POA: Diagnosis not present

## 2014-01-19 DIAGNOSIS — L820 Inflamed seborrheic keratosis: Secondary | ICD-10-CM | POA: Diagnosis not present

## 2014-01-19 DIAGNOSIS — Z6825 Body mass index (BMI) 25.0-25.9, adult: Secondary | ICD-10-CM | POA: Diagnosis not present

## 2014-01-19 DIAGNOSIS — L989 Disorder of the skin and subcutaneous tissue, unspecified: Secondary | ICD-10-CM | POA: Diagnosis not present

## 2014-01-19 DIAGNOSIS — I10 Essential (primary) hypertension: Secondary | ICD-10-CM | POA: Diagnosis not present

## 2014-02-01 ENCOUNTER — Ambulatory Visit (HOSPITAL_COMMUNITY): Payer: Medicare Other | Attending: Cardiology | Admitting: Radiology

## 2014-02-01 ENCOUNTER — Other Ambulatory Visit: Payer: Medicare Other

## 2014-02-01 DIAGNOSIS — I480 Paroxysmal atrial fibrillation: Secondary | ICD-10-CM

## 2014-02-01 DIAGNOSIS — I25810 Atherosclerosis of coronary artery bypass graft(s) without angina pectoris: Secondary | ICD-10-CM

## 2014-02-01 DIAGNOSIS — R002 Palpitations: Secondary | ICD-10-CM | POA: Diagnosis not present

## 2014-02-01 DIAGNOSIS — I10 Essential (primary) hypertension: Secondary | ICD-10-CM | POA: Diagnosis not present

## 2014-02-01 DIAGNOSIS — R0609 Other forms of dyspnea: Secondary | ICD-10-CM | POA: Diagnosis not present

## 2014-02-01 DIAGNOSIS — R079 Chest pain, unspecified: Secondary | ICD-10-CM

## 2014-02-01 DIAGNOSIS — R42 Dizziness and giddiness: Secondary | ICD-10-CM

## 2014-02-01 DIAGNOSIS — Z951 Presence of aortocoronary bypass graft: Secondary | ICD-10-CM | POA: Insufficient documentation

## 2014-02-01 DIAGNOSIS — I5032 Chronic diastolic (congestive) heart failure: Secondary | ICD-10-CM

## 2014-02-01 MED ORDER — TECHNETIUM TC 99M SESTAMIBI GENERIC - CARDIOLITE
10.0000 | Freq: Once | INTRAVENOUS | Status: AC | PRN
  Administered 2014-02-01: 10 via INTRAVENOUS

## 2014-02-01 MED ORDER — TECHNETIUM TC 99M SESTAMIBI GENERIC - CARDIOLITE
30.0000 | Freq: Once | INTRAVENOUS | Status: AC | PRN
  Administered 2014-02-01: 30 via INTRAVENOUS

## 2014-02-01 MED ORDER — REGADENOSON 0.4 MG/5ML IV SOLN
0.4000 mg | Freq: Once | INTRAVENOUS | Status: AC
  Administered 2014-02-01: 0.4 mg via INTRAVENOUS

## 2014-02-01 NOTE — Progress Notes (Signed)
  [facility] [address] [place], [place] [address] [phone]    Cardiology Nuclear Med Study  Renee [name] is a 79 y.o. female     MRN : [id number]     DOB: 05/26/1934  Procedure Date: 02/01/2014  Nuclear Med Background Indication for Stress Test:  Evaluation for Ischemia and Graft Patency History:  CABG, PAF,CHF,and 05-2012 Myocardial Perfusion Imaging-Normal, EF=78% Cardiac Risk Factors: Hypertension, and IRBBB  Symptoms: Chest Tightness with/without exertion (last occurrence yesterday), DOE, Palpitations and SOB   Nuclear Pre-Procedure Caffeine/Decaff Intake:  None NPO After: 7:00pm   Lungs:  clear O2 Sat: 97% on 2L via nasal cannula. IV 0.9% NS with Angio Cath:  22g  IV Site: R Hand  IV Started by:  Matilde Haymaker, RN  Chest Size (in):  38 Cup Size: C  Height: 5\' 6"  (1.676 m)  Weight:  155 lb (70.308 kg)  BMI:  Body mass index is 25.03 kg/(m^2). Tech Comments:  n/a    Nuclear Med Study 1 or 2 day study: 1 day  Stress Test Type:  Lexiscan  Reading MD: n/a Order Authorizing Provider: Darolyn Rua Jordan,MD and Dayna Dunn,PAC  Resting Radionuclide: Technetium 12m Sestamibi  Resting Radionuclide Dose: 11.0 mCi   Stress Radionuclide:  Technetium 86m Sestamibi  Stress Radionuclide Dose: 33.0 mCi           Stress Protocol Rest HR: 78 Stress HR: 99  Rest BP: 138/67 Stress BP: 134/70  Exercise Time (min): n/a METS: n/a   Predicted Max HR: 143 bpm % Max HR: 69.23 bpm Rate Pressure Product: 13266   Dose of Adenosine (mg):  n/a Dose of Lexiscan: 0.4 mg  Dose of Atropine (mg): n/a Dose of Dobutamine: n/a mcg/kg/min (at max HR)  Stress Test Technologist: Irven Baltimore, RN  Nuclear Technologist:  Earl Many, CNMT     Rest Procedure:  Myocardial perfusion imaging was performed at rest 45 minutes following the intravenous administration of Technetium 72m Sestamibi. Rest ECG: NSR-RBBB  Stress Procedure:  The patient  received IV Lexiscan 0.4 mg over 15-seconds.  Technetium 57m Sestamibi injected at 30-seconds.  The patient complained of headache, chest pain, stomach pain, SOB, and nausea with Lexiscan.Quantitative spect images were obtained after a 45 minute delay. Stress ECG: No significant change from baseline ECG  QPS Raw Data Images:  Normal; no motion artifact; normal heart/lung ratio. Stress Images:  Normal homogeneous uptake in all areas of the myocardium. Rest Images:  Normal homogeneous uptake in all areas of the myocardium. Subtraction (SDS):  No evidence of ischemia. Transient Ischemic Dilatation (Normal <1.22):  0.92 Lung/Heart Ratio (Normal <0.45):  0.32  Quantitative Gated Spect Images QGS EDV:  54 ml QGS ESV:  18 ml  Impression Exercise Capacity:  Lexiscan with no exercise. BP Response:  Normal blood pressure response. Clinical Symptoms:  No significant symptoms noted. ECG Impression:  No significant ST segment change suggestive of ischemia. Comparison with Prior Nuclear Study: No significant change from previous study on 05/09/12  Overall Impression:  Normal stress nuclear study.  LV Ejection Fraction: 66%.  LV Wall Motion:  NL LV Function; NL Wall Motion.   Thayer Headings, Brooke Bonito., MD, [facility] 02/01/2014, 5:06 PM [address],  [place] Pager [phone]

## 2014-02-02 ENCOUNTER — Telehealth: Payer: Self-pay | Admitting: *Deleted

## 2014-02-02 LAB — AMBIG ABBREV BMP8 DEFAULT

## 2014-02-02 NOTE — Telephone Encounter (Signed)
pt notified about myoview results with verbal understanding. Pt asked did this mean she was to stay on Tikosyn, I replied yes at least until she can s/w Dr. Caryl Comes in regards to [facility]. Pt said ok and thank you.

## 2014-02-03 LAB — BASIC METABOLIC PANEL
BUN/Creatinine Ratio: 10 — ABNORMAL LOW (ref 11–26)
BUN: 13 mg/dL (ref 8–27)
CALCIUM: 9.7 mg/dL (ref 8.7–10.3)
CHLORIDE: 99 mmol/L (ref 97–108)
CO2: 16 mmol/L — AB (ref 18–29)
Creatinine, Ser: 1.28 mg/dL — ABNORMAL HIGH (ref 0.57–1.00)
GFR calc Af Amer: 47 mL/min/{1.73_m2} — ABNORMAL LOW (ref >59)
GFR calc non Af Amer: 40 mL/min/{1.73_m2} — ABNORMAL LOW (ref >59)
GLUCOSE: 72 mg/dL (ref 65–99)
POTASSIUM: 4.4 mmol/L (ref 3.5–5.2)
Sodium: 144 mmol/L (ref 134–144)

## 2014-02-03 LAB — SPECIMEN STATUS REPORT

## 2014-02-04 ENCOUNTER — Encounter: Payer: Self-pay | Admitting: Physician Assistant

## 2014-02-11 NOTE — Progress Notes (Signed)
Hey Renee [name],  Just wanted to let you know that you do not need to do anything with this lab. I think it went into your basket because that is when it was finally scanned in. Let me know if you need anything else on this, but I think you are good.  [name]

## 2014-02-12 ENCOUNTER — Other Ambulatory Visit: Payer: Self-pay | Admitting: Cardiology

## 2014-02-12 ENCOUNTER — Telehealth: Payer: Self-pay | Admitting: Cardiology

## 2014-02-12 MED ORDER — APIXABAN 5 MG PO TABS: 5.0000 mg | ORAL_TABLET | Freq: Two times a day (BID) | ORAL | Status: DC

## 2014-02-12 NOTE — Telephone Encounter (Signed)
Samples left at front, patient informed.

## 2014-02-12 NOTE — Telephone Encounter (Signed)
Pt called in stating that she is out of her Eliquis 5mg  and her mail order pharmacy will not be able to ship out her meds until tomorrow. Pt is requesting some samples an/or a written rx for this medication sent to [facility] on  [address]. Please call  Thanks

## 2014-02-15 ENCOUNTER — Encounter (HOSPITAL_COMMUNITY): Payer: Medicare Other

## 2014-02-19 ENCOUNTER — Other Ambulatory Visit: Payer: Self-pay | Admitting: *Deleted

## 2014-02-19 DIAGNOSIS — I10 Essential (primary) hypertension: Secondary | ICD-10-CM

## 2014-02-23 ENCOUNTER — Other Ambulatory Visit (INDEPENDENT_AMBULATORY_CARE_PROVIDER_SITE_OTHER): Payer: Medicare Other | Admitting: *Deleted

## 2014-02-23 DIAGNOSIS — I10 Essential (primary) hypertension: Secondary | ICD-10-CM | POA: Diagnosis not present

## 2014-02-23 LAB — BASIC METABOLIC PANEL
BUN: 12 mg/dL (ref 6–23)
CHLORIDE: 102 meq/L (ref 96–112)
CO2: 29 meq/L (ref 19–32)
CREATININE: 1.14 mg/dL (ref 0.40–1.20)
Calcium: 9.3 mg/dL (ref 8.4–10.5)
GFR: 49.07 mL/min — AB (ref >60.00)
GLUCOSE: 104 mg/dL — AB (ref 70–99)
POTASSIUM: 3.7 meq/L (ref 3.5–5.1)
Sodium: 138 mEq/L (ref 135–145)

## 2014-02-26 ENCOUNTER — Ambulatory Visit: Payer: Medicare Other | Admitting: Internal Medicine

## 2014-03-02 DIAGNOSIS — J019 Acute sinusitis, unspecified: Secondary | ICD-10-CM | POA: Diagnosis not present

## 2014-03-02 DIAGNOSIS — Z6825 Body mass index (BMI) 25.0-25.9, adult: Secondary | ICD-10-CM | POA: Diagnosis not present

## 2014-03-02 DIAGNOSIS — R042 Hemoptysis: Secondary | ICD-10-CM | POA: Diagnosis not present

## 2014-03-02 DIAGNOSIS — I480 Paroxysmal atrial fibrillation: Secondary | ICD-10-CM | POA: Diagnosis not present

## 2014-03-02 DIAGNOSIS — J029 Acute pharyngitis, unspecified: Secondary | ICD-10-CM | POA: Diagnosis not present

## 2014-03-15 ENCOUNTER — Encounter: Payer: Self-pay | Admitting: Internal Medicine

## 2014-03-15 ENCOUNTER — Ambulatory Visit (INDEPENDENT_AMBULATORY_CARE_PROVIDER_SITE_OTHER): Payer: Medicare Other | Admitting: Internal Medicine

## 2014-03-15 ENCOUNTER — Other Ambulatory Visit: Payer: Self-pay

## 2014-03-15 VITALS — BP 144/72 | HR 82 | Ht 66.0 in | Wt 155.8 lb

## 2014-03-15 DIAGNOSIS — I5032 Chronic diastolic (congestive) heart failure: Secondary | ICD-10-CM | POA: Diagnosis not present

## 2014-03-15 DIAGNOSIS — I480 Paroxysmal atrial fibrillation: Secondary | ICD-10-CM

## 2014-03-15 MED ORDER — METOPROLOL SUCCINATE ER 25 MG PO TB24: 12.5000 mg | ORAL_TABLET | Freq: Two times a day (BID) | ORAL | Status: DC

## 2014-03-15 MED ORDER — POTASSIUM CHLORIDE ER 20 MEQ PO TBCR: 40.0000 meq | EXTENDED_RELEASE_TABLET | Freq: Every day | ORAL | Status: DC

## 2014-03-15 NOTE — Patient Instructions (Addendum)
Your physician has recommended you make the following change in your medication:  1) STOP Diltiazem 2) INCREASE Toprol 12.5 mg to twice a day  You have been referred to Dr. Thompson Grayer for evaluation for AFib ablation.

## 2014-03-15 NOTE — Progress Notes (Signed)
Patient Care Team: [name], MD as PCP - General (Internal Medicine) Elsie Stain, MD (Pulmonary Disease) Peter M Martinique, MD (Cardiology)   HPI  Renee [name] is a 80 y.o. female w hx of  atrial fibrillation that dates back a number of years possibly to around the time of her bypass surgery in 2011.  nd this creates a great deal of anxiety.  Recording demonstrated episodes of very rapid atrial fibrillation with rates up to 190s. Cardizem was added to metoprolol; up titration was complicated by significant edema.  At some point amiodarone was initiated. This resulted in significant shortness of breath, I suspect manifestation of pulmonary toxicity as well as hypothyroidism prompting thyroid replacement.  When I saw her in 7/15 discussed the initiation of dofetilide. This was finally initiated 10/15. Ultimately she ended up on 125 twice a day secondary to QT prolongation.   She apparently continued to have palpitations; Holter monitor 11/15 and 30 PACs and PVCs but no atrial fibrillation.  She saw Sharrell Ku 12/15 and was quite frustrated with things at that point.  Subsequently she was hospitalized with orthostatic lightheadedness which resulted in down titration of her medications.  It is her impression and her husband's impression that fatigue and throat issues are considerably worse since the initiation of dofetilide; concurs with this however close the initiation of Cardizem.   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 GERD (gastroesophageal reflux disease)   . IBS (irritable bowel syndrome)   . RBBB   . Coronary artery disease     a. CABG 09/2009. b. normal myoview in 05/2012.   [name] Anxiety   . PAF (paroxysmal atrial fibrillation)     a. Post op after CABG. Intolerant of amio in the past. b. Recurrence - started on Tikosyn  11/2013.  [name] Chronic diastolic CHF (congestive heart failure)   . Dysphagia   . Colon polyps   . Hypertension   . HLD (hyperlipidemia)   . Hypothyroidism   . Chronic bronchitis   . Emphysema/COPD   . Sciatic nerve pain   . Orthostatic hypotension   . PVC's (premature ventricular contractions)     a. Holter 11/2013: no AF, no SVT, rare PVC couplet, occasional PACs.  . Premature atrial contractions     a. Holter 11/2013: no AF, no SVT, rare PVC couplet, occasional PACs.  . Chronic respiratory failure   . Bradycardia     Past Surgical History  Procedure Laterality Date  . Total hip arthroplasty Right 11/01/2008  . Appendectomy  1995  . Tonsillectomy and adenoidectomy    . Joint replacement    . Coronary artery bypass graft  09/21/2009    CABG X2  . Cardiac catheterization  1990's; 06/2009  . Cholecystectomy  2008  . Larynx surgery  ~ 1960    "tumor removed"  . Total abdominal hysterectomy  1986  . Tubal ligation  1973  . Cataract extraction w/ intraocular lens  implant, bilateral Bilateral   . Eye surgery    . Retinal laser procedure Left 10/19/2013    "had a wrinkle in it"    Current Outpatient Prescriptions  Medication Sig Dispense Refill  . Aclidinium Bromide (TUDORZA PRESSAIR) 400 MCG/ACT AEPB Inhale 1 puff into the lungs 2 (two) times daily. 3 each 2  . ALPRAZolam (XANAX) 0.25 MG tablet Take 0.25 mg  by mouth 2 (two) times daily as needed for anxiety or sleep.    [name] apixaban (ELIQUIS) 5 MG TABS tablet Take 1 tablet (5 mg total) by mouth 2 (two) times daily. 28 tablet 0  . cholecalciferol (VITAMIN D) 1000 UNITS tablet Take 1,000 Units by mouth daily.    [name] diltiazem (CARDIZEM CD) 240 MG 24 hr capsule Take 1 capsule (240 mg total) by mouth daily. 90 capsule 1  . dofetilide (TIKOSYN) 125 MCG capsule Take 1 capsule (125 mcg total) by mouth 2 (two) times daily. 14 capsule 0  . ELIQUIS 5 MG TABS tablet TAKE 1 TABLET BY MOUTH TWICE DAILY 60 tablet 5  . esomeprazole (NEXIUM) 40 MG  capsule Take 40 mg by mouth daily before breakfast.      . furosemide (LASIX) 20 MG tablet Take 40mg  daily (Patient taking differently: Take 40 mg by mouth daily. Take 40mg  daily) 60 tablet 11  . levalbuterol (XOPENEX HFA) 45 MCG/ACT inhaler Inhale 2 puffs into the lungs every 6 (six) hours as needed for wheezing.    [name] levothyroxine (SYNTHROID, LEVOTHROID) 25 MCG tablet Take 25 mcg by mouth daily before breakfast.     . metoprolol succinate (TOPROL-XL) 25 MG 24 hr tablet Take 0.5 tablets (12.5 mg total) by mouth daily. 15 tablet 6  . nitroGLYCERIN (NITROSTAT) 0.4 MG SL tablet Place 0.4 mg under the tongue every 5 (five) minutes as needed for chest pain.    [name] Glycol-Propyl Glycol (SYSTANE OP) Place 1 drop into both eyes daily.     . Potassium Chloride ER 20 MEQ TBCR Take 40 mEq by mouth daily. 90 tablet 3  . traMADol (ULTRAM) 50 MG tablet Take 1 tablet (50 mg total) by mouth every 6 (six) hours as needed. Pain 30 tablet 0  . [DISCONTINUED] budesonide-formoterol (SYMBICORT) 160-4.5 MCG/ACT inhaler Inhale 2 puffs into the lungs 2 (two) times daily. 1 Inhaler 12  . [DISCONTINUED] calcium citrate-vitamin D (CITRACAL+D) 315-200 MG-UNIT per tablet Take 1 tablet by mouth 2 (two) times daily.       No current facility-administered medications for this visit.    Allergies  Allergen Reactions  . Spiriva [Tiotropium Bromide Monohydrate] Other (See Comments)    Throat irritation  . Statins     Legs hurt  . Levofloxacin Nausea Only    Unable to tolerate more than 7days  . Budesonide-Formoterol Fumarate     Tachycardia  . Morphine     REACTION: double vision  . Penicillins     REACTION: swelling/rash  . Phenazopyridine Hcl     *piridium* REACTION: face red  . Pneumococcal Vaccine     REACTION: rash and cough  . Pneumococcal Vaccines Swelling    Redding of the skin  . Prednisone     REACTION: rash  . Prednisone Swelling  . Sulfonamide Derivatives     REACTION: rash  . Tylenol  [Acetaminophen]     Tylenol #3 -- rash and swelling - takes plain Tylenol ok  . Penicillins Swelling and Rash  . Sulfa Antibiotics Rash    Review of Systems negative except from HPI and PMH  Physical Exam BP 144/72 mmHg  Pulse 82  Ht 5\' 6"  (1.676 m)  Wt 155 lb 12.8 oz (70.67 kg)  BMI 25.16 kg/m2 Well developed and well nourished in no acute distress HENT normal E scleral and icterus clear Neck Supple JVP flat; carotids brisk and full Clear to ausculatio\ Regular rate and rhythm, no murmurs gallops or rub Soft  with active bowel sounds No clubbing cyanosis none Edema Alert and oriented, grossly normal motor and sensory function Skin Warm and Dry    Assessment and  Plan  Atrial fibrillation-paroxysmal-rapid ventricular response  Right bundle branch block-incomplete  Coronary disease with prior bypass  Hypokalemia-history of  Throat discomfort  Fatigue  Nocturnal hypoxemia  She has not tolerated the dofetilide; there have been no significant association with side effects including fatigue as well as sore throat and fullness in throat. The fatigue might be related to her Cardizem. We will stop it and  Increase her betablocekr to 25 bid   She's had recurrent symptomatic palpitations both irregularities as well as racing the latter suggestive of atrial fibrillation although these episodes have been relatively brief measured only in minutes.  She would like to get off of dofetilide. Antiarrhythmic options with that only include sotalol. She would like to talk to Dr. Rayann Heman concerning catheter ablation. I have advised her that there might be some symptomatic palpitations following that given the PACs and PVCs detected on monitoring; she says that these are increasingly less bothersome to her.  The throat discomfort is also something potentially attributable to the dofetilide. She will continue it however until she sees Dr. Greggory Brandy. I will look forward to seeing her after she sees  Dr. Greggory Brandy to discuss catheter ablation.  I've also been in touch with Dr. Beaulah Corin W to ask whether or not a sleep study might not be appropriate given her history of nocturnal hypoxemia and atrial fibrillation

## 2014-03-24 ENCOUNTER — Other Ambulatory Visit: Payer: Self-pay | Admitting: Physician Assistant

## 2014-03-24 NOTE — Telephone Encounter (Signed)
Please review for refill. Thanks!  

## 2014-03-25 ENCOUNTER — Other Ambulatory Visit: Payer: Self-pay

## 2014-03-25 MED ORDER — DOFETILIDE 125 MCG PO CAPS: 125.0000 ug | ORAL_CAPSULE | Freq: Two times a day (BID) | ORAL | Status: DC

## 2014-03-26 ENCOUNTER — Other Ambulatory Visit: Payer: Self-pay | Admitting: *Deleted

## 2014-03-26 MED ORDER — METOPROLOL SUCCINATE ER 25 MG PO TB24: 25.0000 mg | ORAL_TABLET | Freq: Two times a day (BID) | ORAL | Status: DC

## 2014-03-26 NOTE — Telephone Encounter (Signed)
Tikosyn has been refilled. Sent in yesterday. (ok to refill)

## 2014-03-29 DIAGNOSIS — R070 Pain in throat: Secondary | ICD-10-CM | POA: Diagnosis not present

## 2014-03-29 DIAGNOSIS — H7201 Central perforation of tympanic membrane, right ear: Secondary | ICD-10-CM | POA: Diagnosis not present

## 2014-04-05 ENCOUNTER — Ambulatory Visit (INDEPENDENT_AMBULATORY_CARE_PROVIDER_SITE_OTHER): Payer: Medicare Other | Admitting: Internal Medicine

## 2014-04-05 ENCOUNTER — Encounter: Payer: Self-pay | Admitting: *Deleted

## 2014-04-05 ENCOUNTER — Encounter: Payer: Self-pay | Admitting: Internal Medicine

## 2014-04-05 ENCOUNTER — Other Ambulatory Visit: Payer: Self-pay

## 2014-04-05 VITALS — BP 128/82 | HR 79 | Ht 66.0 in | Wt 156.0 lb

## 2014-04-05 DIAGNOSIS — I10 Essential (primary) hypertension: Secondary | ICD-10-CM

## 2014-04-05 DIAGNOSIS — I480 Paroxysmal atrial fibrillation: Secondary | ICD-10-CM | POA: Diagnosis not present

## 2014-04-05 NOTE — Patient Instructions (Signed)
Your physician has recommended that you have an ablation. Catheter ablation is a medical procedure used to treat some cardiac arrhythmias (irregular heartbeats). During catheter ablation, a long, thin, flexible tube is put into a blood vessel in your groin (upper thigh), or neck. This tube is called an ablation catheter. It is then guided to your heart through the blood vessel. Radio frequency waves destroy small areas of heart tissue where abnormal heartbeats may cause an arrhythmia to start. Please see the instruction sheet given to you today.  See instruction sheet for procedure  Cardiac Ablation Cardiac ablation is a procedure to disable a small amount of heart tissue in very specific places. The heart has many electrical connections. Sometimes these connections are abnormal and can cause the heart to beat very fast or irregularly. By disabling some of the problem areas, heart rhythm can be improved or made normal. Ablation is done for people who:   Have Wolff-Parkinson-White syndrome.   Have other fast heart rhythms (tachycardia).   Have taken medicines for an abnormal heart rhythm (arrhythmia) that resulted in:   No success.   Side effects.   May have a high-risk heartbeat that could result in death.  LET YOUR HEALTH CARE PROVIDER KNOW ABOUT:   Any allergies you have or any previous reactions you have had to X-ray dye, food (such as seafood), medicine, or tape.   All medicines you are taking, including vitamins, herbs, eye drops, creams, and over-the-counter medicines.   Previous problems you or members of your family have had with the use of anesthetics.   Any blood disorders you have.   Previous surgeries or procedures (such as a kidney transplant) you have had.   Medical conditions you have (such as kidney failure).  RISKS AND COMPLICATIONS Generally, cardiac ablation is a safe procedure. However, problems can occur and include:   Increased risk of cancer.  Depending on how long it takes to do the ablation, the dose of radiation can be high.  Bruising and bleeding where a thin, flexible tube (catheter) was inserted during the procedure.   Bleeding into the chest, especially into the sac that surrounds the heart (serious).  Need for a permanent pacemaker if the normal electrical system is damaged.   The procedure may not be fully effective, and this may not be recognized for months. Repeat ablation procedures are sometimes required. BEFORE THE PROCEDURE   Follow any instructions from your health care provider regarding eating and drinking before the procedure.   Take your medicines as directed at regular times with water, unless instructed otherwise by your health care provider. If you are taking diabetes medicine, including insulin, ask how you are to take it and if there are any special instructions you should follow. It is common to adjust insulin dosing the day of the ablation.  PROCEDURE  An ablation is usually performed in a catheterization laboratory with the guidance of fluoroscopy. Fluoroscopy is a type of X-ray that helps your health care provider see images of your heart during the procedure.   An ablation is a minimally invasive procedure. This means a small cut (incision) is made in either your neck or groin. Your health care provider will decide where to make the incision based on your medical history and physical exam.  An IV tube will be started before the procedure begins. You will be given an anesthetic or medicine to help you relax (sedative).  The skin on your neck or groin will be numbed. A needle   will be inserted into a large vein in your neck or groin and catheters will be threaded to your heart.  A special dye that shows up on fluoroscopy pictures may be injected through the catheter. The dye helps your health care provider see the area of the heart that needs treatment.  The catheter has electrodes on the tip.  When the area of heart tissue that is causing the arrhythmia is found, the catheter tip will send an electrical current to the area and "scar" the tissue. Three types of energy can be used to ablate the heart tissue:   Heat (radiofrequency energy).   Laser energy.   Extreme cold (cryoablation).   When the area of the heart has been ablated, the catheter will be taken out. Pressure will be held on the insertion site. This will help the insertion site clot and keep it from bleeding. A bandage will be placed on the insertion site.  AFTER THE PROCEDURE   After the procedure, you will be taken to a recovery area where your vital signs (blood pressure, heart rate, and breathing) will be monitored. The insertion site will also be monitored for bleeding.   You will need to lie still for 4-6 hours. This is to ensure you do not bleed from the catheter insertion site.  Document Released: 06/10/2008 Document Revised: 06/08/2013 Document Reviewed: 06/16/2012 [employer] Patient Information 2015 [employer]. This information is not intended to replace advice given to you by your health care provider. Make sure you discuss any questions you have with your health care provider.  

## 2014-04-05 NOTE — Progress Notes (Signed)
Electrophysiology Office Note   Date:  04/05/2014   ID:  Renee [name], Renee [name] 1934-10-14, MRN [id number]  PCP:  [name], MD  Cardiologist:  Dr Martinique Primary Electrophysiologist: Dr Caryl Comes   Chief Complaint  Patient presents with  . Appointment    PAF     History of Present Illness: Renee [name] is a 80 y.o. female who presents today for electrophysiology evaluation of her afib.  She has had atrial fibrillation for several years.  This was initially diagnosed after CABG in 2011.  She was placed on amiodarone but did not tolerate this medicine due to worsening SOB.  Due to afib with RVR, she has been treated with both cardizem and metoprolol.  She has not tolerated diltiazem very well.  She was evaluated by Dr Caryl Comes and placed on tikosyn 10/15.  She does not feel that she has tolerated tikosyn.  She reports symptoms of throat discomfort as well as fatigue.  She has ongoing palpitations which appear to be primarily pacs and pvcs.  She is not very active.  She has chronic lung disease with poor exercise effort.  Today, she denies symptoms of chest pain,  orthopnea, PND, lower extremity edema, claudication, dizziness, presyncope, syncope, bleeding, or neurologic sequela. The patient is tolerating medications without difficulties and is otherwise without complaint today.    Past Medical History  Diagnosis Date  . GERD (gastroesophageal reflux disease)   . IBS (irritable bowel syndrome)   . RBBB   . Coronary artery disease     a. CABG 09/2009. b. normal myoview in 05/2012.   [name] Anxiety   . PAF (paroxysmal atrial fibrillation)     a. Post op after CABG. Intolerant of amio in the past. b. Recurrence - started on Tikosyn 11/2013.  [name] Chronic diastolic CHF (congestive heart failure)   . Dysphagia   . Colon polyps   . Hypertension   . HLD (hyperlipidemia)   . Hypothyroidism   . Chronic bronchitis   . Emphysema/COPD     on home O2  . Sciatic nerve pain   . Orthostatic  hypotension   . PVC's (premature ventricular contractions)     a. Holter 11/2013: no AF, no SVT, rare PVC couplet, occasional PACs.  . Premature atrial contractions     a. Holter 11/2013: no AF, no SVT, rare PVC couplet, occasional PACs.  . Chronic respiratory failure   . Bradycardia    Past Surgical History  Procedure Laterality Date  . Total hip arthroplasty Right 11/01/2008  . Appendectomy  1995  . Tonsillectomy and adenoidectomy    . Joint replacement    . Coronary artery bypass graft  09/21/2009    CABG X2  . Cardiac catheterization  1990's; 06/2009  . Cholecystectomy  2008  . Larynx surgery  ~ 1960    "tumor removed"  . Total abdominal hysterectomy  1986  . Tubal ligation  1973  . Cataract extraction w/ intraocular lens  implant, bilateral Bilateral   . Eye surgery    . Retinal laser procedure Left 10/19/2013    "had a wrinkle in it"     Current Outpatient Prescriptions  Medication Sig Dispense Refill  . Aclidinium Bromide (TUDORZA PRESSAIR) 400 MCG/ACT AEPB Inhale 1 puff into the lungs 2 (two) times daily. 3 each 2  . ALPRAZolam (XANAX) 0.25 MG tablet Take 0.25 mg by mouth 2 (two) times daily as needed for anxiety or sleep.    [name] apixaban (ELIQUIS) 5 MG TABS tablet  Take 1 tablet (5 mg total) by mouth 2 (two) times daily. 28 tablet 0  . cholecalciferol (VITAMIN D) 1000 UNITS tablet Take 1,000 Units by mouth daily.    . clindamycin (CLEOCIN) 300 MG capsule Take 1 capsule by mouth 3 (three) times daily.  0  . dofetilide (TIKOSYN) 125 MCG capsule Take 1 capsule (125 mcg total) by mouth 2 (two) times daily. 60 capsule 6  . esomeprazole (NEXIUM) 40 MG capsule Take 40 mg by mouth daily before breakfast.      . furosemide (LASIX) 20 MG tablet Take 40mg  daily (Patient taking differently: Take 40 mg by mouth daily. Take 40mg  daily) 60 tablet 11  . levalbuterol (XOPENEX HFA) 45 MCG/ACT inhaler Inhale 2 puffs into the lungs every 6 (six) hours as needed for wheezing.    [name] levothyroxine  (SYNTHROID, LEVOTHROID) 25 MCG tablet Take 25 mcg by mouth daily before breakfast.     . metoprolol succinate (TOPROL-XL) 25 MG 24 hr tablet Take 1 tablet (25 mg total) by mouth 2 (two) times daily. 30 tablet 6  . nitroGLYCERIN (NITROSTAT) 0.4 MG SL tablet Place 0.4 mg under the tongue every 5 (five) minutes as needed for chest pain.    [name] Glycol-Propyl Glycol (SYSTANE OP) Place 1 drop into both eyes daily.     . Potassium Chloride ER 20 MEQ TBCR Take 40 mEq by mouth daily. 90 tablet 3  . traMADol (ULTRAM) 50 MG tablet Take 1 tablet (50 mg total) by mouth every 6 (six) hours as needed. Pain 30 tablet 0  . [DISCONTINUED] budesonide-formoterol (SYMBICORT) 160-4.5 MCG/ACT inhaler Inhale 2 puffs into the lungs 2 (two) times daily. 1 Inhaler 12  . [DISCONTINUED] calcium citrate-vitamin D (CITRACAL+D) 315-200 MG-UNIT per tablet Take 1 tablet by mouth 2 (two) times daily.       No current facility-administered medications for this visit.    Allergies:   Spiriva; Statins; Levofloxacin; Budesonide-formoterol fumarate; Morphine; Penicillins; Phenazopyridine hcl; Pneumococcal vaccine; Pneumococcal vaccines; Prednisone; Prednisone; Sulfonamide derivatives; Tylenol; Penicillins; and Sulfa antibiotics   Social History:  The patient  reports that she quit smoking about 4 years ago. Her smoking use included Cigarettes. She has a 50 pack-year smoking history. She has never used smokeless tobacco. She reports that she does not drink alcohol or use illicit drugs.   Family History:  The patient's  family history includes Breast cancer in her sister; Cancer in her sister; Colon cancer in her daughter; Colon polyps in her brother; Deep vein thrombosis in her mother; Diabetes in her father; Heart attack in her father; Heart disease in her father and mother; Hyperlipidemia in her father; Hypertension in her brother, father, and mother; Other in her sister; Transient ischemic attack in her mother.    ROS:  Please  see the history of present illness.   All other systems are reviewed and negative.    PHYSICAL EXAM: VS:  BP 128/82 mmHg  Pulse 79  Ht 5\' 6"  (1.676 m)  Wt 156 lb (70.761 kg)  BMI 25.19 kg/m2 , BMI Body mass index is 25.19 kg/(m^2). GEN: Well nourished, well developed, in no acute distress HEENT: normal Neck: no JVD, carotid bruits, or masses Cardiac: RRR; no murmurs, rubs, or gallops,no edema  Respiratory:  clear to auscultation bilaterally, normal work of breathing GI: soft, nontender, nondistended, + BS MS: no deformity or atrophy Skin: warm and dry  Neuro:  Strength and sensation are intact Psych: anxious  EKG:  EKG is ordered today. The ekg  ordered today shows sinus rhythm with nonsustained atach   Recent Labs: 11/30/2013: Pro B Natriuretic peptide (BNP) 250.9 12/16/2013: Magnesium 2.1 01/18/2014: Hemoglobin 13.1; Platelets 293.0; TSH 1.98 02/23/2014: BUN 12; Creatinine 1.14; Potassium 3.7; Sodium 138    Lipid Panel     Component Value Date/Time   CHOL * 07/16/2007 0030    207        ATP III CLASSIFICATION:  <200     mg/dL   Desirable  200-239  mg/dL   Borderline High  >=240    mg/dL   High   TRIG 116 07/16/2007 0030   HDL 45 07/16/2007 0030   CHOLHDL 4.6 07/16/2007 0030   VLDL 23 07/16/2007 0030   LDLCALC * 07/16/2007 0030    139        Total Cholesterol/HDL:CHD Risk Coronary Heart Disease Risk Table                     Men   Women  1/2 Average Risk   3.4   3.3     Wt Readings from Last 3 Encounters:  04/05/14 156 lb (70.761 kg)  03/15/14 155 lb 12.8 oz (70.67 kg)  02/01/14 155 lb (70.308 kg)      Other studies Reviewed: Additional studies/ records that were reviewed today include: Dr Klein/Jordan/Wrights notes  Echo 11/19/13 reveals preserved EF with normal LA size and no significant valvular disease   ASSESSMENT AND PLAN:  1.  paroxysmal atrial fibrillation The patient has symptomatic recurrent atrial fibrillation.  She has failed medical  therapy with amiodarone and tikosyn (due to symptoms).  I am not convinced that all of her problems are due to either afib or tikosyn but suspect that her quality of life may be improved with reduction in afib and possibly discontinuing tikosyn.  Therapeutic strategies for afib including medicine and ablation were discussed in detail with the patient today. Risk, benefits, and alternatives to EP study and radiofrequency ablation for afib were also discussed in detail today. These risks include but are not limited to stroke, bleeding, vascular damage, tamponade, perforation, damage to the esophagus, lungs, and other structures, pulmonary vein stenosis, worsening renal function, and death.  She understands that her pulmonary issues increase her risks with anesthesia.  The patient understands these risk and wishes to proceed.  We will therefore proceed with catheter ablation at the next available time.   2. CAD Stable without ischemic symptoms No change required today  3. HTN Stable No change required today   Current medicines are reviewed at length with the patient today.   The patient does not have concerns regarding her medicines.  The following changes were made today:  none  Labs/ tests ordered today include:  Orders Placed This Encounter  Procedures  . Basic metabolic panel  . CBC with Differential/Platelet  . EKG 12-Lead     Signed, Thompson Grayer, MD  04/05/2014 8:45 PM     [place] [address] [phone] (office) [phone] (fax)

## 2014-04-06 ENCOUNTER — Encounter: Payer: Self-pay | Admitting: Internal Medicine

## 2014-04-06 ENCOUNTER — Ambulatory Visit (INDEPENDENT_AMBULATORY_CARE_PROVIDER_SITE_OTHER): Payer: Medicare Other | Admitting: Internal Medicine

## 2014-04-06 VITALS — BP 120/82 | HR 60 | Ht 66.0 in | Wt 156.4 lb

## 2014-04-06 DIAGNOSIS — K219 Gastro-esophageal reflux disease without esophagitis: Secondary | ICD-10-CM | POA: Diagnosis not present

## 2014-04-06 DIAGNOSIS — J312 Chronic pharyngitis: Secondary | ICD-10-CM | POA: Diagnosis not present

## 2014-04-06 DIAGNOSIS — R1310 Dysphagia, unspecified: Secondary | ICD-10-CM | POA: Diagnosis not present

## 2014-04-06 DIAGNOSIS — Z7901 Long term (current) use of anticoagulants: Secondary | ICD-10-CM

## 2014-04-06 NOTE — Progress Notes (Signed)
HISTORY OF PRESENT ILLNESS:  Renee [name] is a 80 y.o. female who is self-referred regarding a greater than one-month history of right throat/jaw pain. She is accompanied by her husband. The patient has not been seen in this office for 5 years. She did undergo colonoscopy with polypectomy (diminutive adenomas) in 2010. Current history is that of right-sided sore throat. Increased phlegm with cough. She has seen her primary care provider as well as an ENT specialist (no information for review). She tells me that she has been treated with 2 courses of antibiotics including clindamycin currently. She also tells me that she underwent what sounds like laryngoscopy without specific diagnosis. Gastroesophageal reflux disease was suggested. Thus, she made this appointment. She has also been to a dentist. She is frustrated. In terms of reflux disease, she has had a prior history of GERD with classic symptoms. However, completely asymptomatic on Nexium. She does report vague swallowing troubles with "choking spells". This may occur without meals. She has had some weight gain. Former smoker. Multiple significant medical problems including atrial fibrillation for which she is on anticoagulation and anticipating TEE and possible ablation. GI review of systems is really unremarkable.  REVIEW OF SYSTEMS:  All non-GI ROS negative except for sinus and allergy, back pain, cough, fatigue, shortness of breath, sore throat, irregular heartbeat, ankle edema  Past Medical History  Diagnosis Date  . GERD (gastroesophageal reflux disease)   . IBS (irritable bowel syndrome)   . RBBB   . Coronary artery disease     a. CABG 09/2009. b. normal myoview in 05/2012.   [name] Anxiety   . PAF (paroxysmal atrial fibrillation)     a. Post op after CABG. Intolerant of amio in the past. b. Recurrence - started on Tikosyn 11/2013.  [name] Chronic diastolic CHF (congestive heart failure)   . Dysphagia   . Colon polyps   . Hypertension   .  HLD (hyperlipidemia)   . Hypothyroidism   . Chronic bronchitis   . Emphysema/COPD     on home O2  . Sciatic nerve pain   . Orthostatic hypotension   . PVC's (premature ventricular contractions)     a. Holter 11/2013: no AF, no SVT, rare PVC couplet, occasional PACs.  . Premature atrial contractions     a. Holter 11/2013: no AF, no SVT, rare PVC couplet, occasional PACs.  . Chronic respiratory failure   . Bradycardia     Past Surgical History  Procedure Laterality Date  . Total hip arthroplasty Right 11/01/2008  . Appendectomy  1995  . Tonsillectomy and adenoidectomy    . Joint replacement    . Coronary artery bypass graft  09/21/2009    CABG X2  . Cardiac catheterization  1990's; 06/2009  . Cholecystectomy  2008  . Larynx surgery  ~ 1960    "tumor removed"  . Total abdominal hysterectomy  1986  . Tubal ligation  1973  . Cataract extraction w/ intraocular lens  implant, bilateral Bilateral   . Eye surgery    . Retinal laser procedure Left 10/19/2013    "had a wrinkle in it"    Social History Renee [name]  reports that she quit smoking about 4 years ago. Her smoking use included Cigarettes. She has a 50 pack-year smoking history. She has never used smokeless tobacco. She reports that she does not drink alcohol or use illicit drugs.  family history includes Breast cancer in her sister; Cancer in her sister; Colon cancer in her daughter; Colon  polyps in her brother; Deep vein thrombosis in her mother; Diabetes in her father; Heart attack in her father; Heart disease in her father and mother; Hyperlipidemia in her father; Hypertension in her brother, father, and mother; Other in her sister; Transient ischemic attack in her mother.  Allergies  Allergen Reactions  . Spiriva [Tiotropium Bromide Monohydrate] Other (See Comments)    Throat irritation  . Statins     Legs hurt  . Levofloxacin Nausea Only    Unable to tolerate more than 7days  . Budesonide-Formoterol Fumarate      Tachycardia  . Morphine     REACTION: double vision  . Penicillins     REACTION: swelling/rash  . Phenazopyridine Hcl     *piridium* REACTION: face red  . Pneumococcal Vaccines Swelling    [place] of the skin  . Prednisone Swelling  . Sulfonamide Derivatives     REACTION: rash  . Tylenol [Acetaminophen]     Tylenol #3 -- rash and swelling - takes plain Tylenol ok  . Penicillins Swelling and Rash  . Sulfa Antibiotics Rash       PHYSICAL EXAMINATION: Vital signs: BP 120/82 mmHg  Pulse 60  Ht 5\' 6"  (1.676 m)  Wt 156 lb 6.4 oz (70.943 kg)  BMI 25.26 kg/m2  Constitutional: generally well-appearing, no acute distress Psychiatric: alert and oriented x3, cooperative. A bit anxious Eyes: extraocular movements intact, anicteric, conjunctiva pink Mouth: oral pharynx moist, no lesions. Dentures in place Neck: supple no lymphadenopathy. Tenderness under the angle of the right mandible without mass, erythema, or warmth Cardiovascular: heart irregular rate and rhythm, no murmur Lungs: clear to auscultation bilaterally Abdomen: soft, nontender, nondistended, no obvious ascites, no peritoneal signs, normal bowel sounds, no organomegaly Rectal: Omitted Extremities: Trace lower extremity edema bilaterally Skin: no lesions on visible extremities Neuro: No focal deficits.   ASSESSMENT:  #1. Complaints of right-sided sore throat and soreness to the mandible with palpation. This is highly unlikely to be GERD or any primary GI disorder. #2. GERD. Classic symptoms controlled with PPI #3. History of diminutive adenomas 2010 #4. Vague dysphagia #5. Multiple medical problems including atrial fibrillation for which she is on Eliquis   PLAN:  #1. I was on as with her and told her that I was not certain why she was having her complaints and that it was likely out of my area of expertise. I did recommend that she return to her PCP and/or ENT for further evaluation #2. Recommend that she continue  PPI for control of classic reflux symptoms #3. Schedule barium esophagram with tablet to evaluate vague dysphagia and rule out significant abnormality. This in lieu of straightaway endoscopy given comorbidities and chronic anticoagulation. Also important pre-TEE. We will contact her with the results #4. No plans for surveillance colonoscopy. Age and comorbidities  Copy sent to Dr. Joylene Draft

## 2014-04-06 NOTE — Patient Instructions (Signed)
You have been scheduled for a Barium Esophogram at [facility] (1st floor of the hospital) on 04/12/2014 at 11:30am. Please arrive 15 minutes prior to your appointment for registration. Make certain not to have anything to eat or drink 3 hours prior to your test. If you need to reschedule for any reason, please contact radiology at [phone] to do so. __________________________________________________________________ A barium swallow is an examination that concentrates on views of the esophagus. This tends to be a double contrast exam (barium and two liquids which, when combined, create a gas to distend the wall of the oesophagus) or single contrast (non-ionic iodine based). The study is usually tailored to your symptoms so a good history is essential. Attention is paid during the study to the form, structure and configuration of the esophagus, looking for functional disorders (such as aspiration, dysphagia, achalasia, motility and reflux) EXAMINATION You may be asked to change into a gown, depending on the type of swallow being performed. A radiologist and radiographer will perform the procedure. The radiologist will advise you of the type of contrast selected for your procedure and direct you during the exam. You will be asked to stand, sit or lie in several different positions and to hold a small amount of fluid in your mouth before being asked to swallow while the imaging is performed .In some instances you may be asked to swallow barium coated marshmallows to assess the motility of a solid food bolus. The exam can be recorded as a digital or video fluoroscopy procedure. POST PROCEDURE It will take 1-2 days for the barium to pass through your system. To facilitate this, it is important, unless otherwise directed, to increase your fluids for the next 24-48hrs and to resume your normal diet.  This test typically takes about 30 minutes to  perform. __________________________________________________________________________________

## 2014-04-12 ENCOUNTER — Ambulatory Visit (HOSPITAL_COMMUNITY)
Admission: RE | Admit: 2014-04-12 | Discharge: 2014-04-12 | Disposition: A | Discharge status: Home / Self Care | Payer: Medicare Other | Source: Ambulatory Visit | Attending: Internal Medicine | Admitting: Internal Medicine

## 2014-04-12 ENCOUNTER — Ambulatory Visit: Payer: Medicare Other | Admitting: Critical Care Medicine

## 2014-04-12 DIAGNOSIS — R1310 Dysphagia, unspecified: Secondary | ICD-10-CM | POA: Diagnosis not present

## 2014-04-21 ENCOUNTER — Ambulatory Visit: Payer: Medicare Other | Admitting: Nurse Practitioner

## 2014-04-28 ENCOUNTER — Ambulatory Visit (INDEPENDENT_AMBULATORY_CARE_PROVIDER_SITE_OTHER): Payer: Medicare Other | Admitting: Critical Care Medicine

## 2014-04-28 ENCOUNTER — Encounter: Payer: Self-pay | Admitting: Critical Care Medicine

## 2014-04-28 VITALS — BP 136/82 | HR 62 | Temp 98.0°F | Ht 66.0 in | Wt 153.2 lb

## 2014-04-28 DIAGNOSIS — J418 Mixed simple and mucopurulent chronic bronchitis: Secondary | ICD-10-CM | POA: Diagnosis not present

## 2014-04-28 MED ORDER — ACLIDINIUM BROMIDE 400 MCG/ACT IN AEPB: INHALATION_SPRAY | RESPIRATORY_TRACT | Status: DC

## 2014-04-28 NOTE — Progress Notes (Signed)
Subjective:    Patient ID: Renee [name], female    DOB: 08-Jan-1935, 80 y.o.   MRN: [id number]  HPI 80 y.o.  female with known hx of COPD   04/28/2014 Chief Complaint  Patient presents with  . Follow-up    Increased SOB, chest tightness, PND, and cough with thick, clear mucus x 1 month.  Needing to use o2 more often during this time.  Scheduled for TEE on April 4 and ablation on April 5.    Pt became ill 6 weeks ago, R ear pain, sinus pressure, throat sore, cough was productive.  Rx ABX, no better>>ENT saw the pt >>said was GI>>>>GI said Bar swallow was normal.  Pt then went to dental issue.  Upper denture issue and gagging.  No new dentures yet.  Pt has mucus and cough up and build up and gets choked on this . Mucus is thick but clear.   Review of Systems Constitutional:   No  weight loss, night sweats,  Fevers, chills,  +fatigue, or  lassitude.  HEENT:   No headaches,  Difficulty swallowing, +++++ Tooth/dental problems,no   Sore throat,                No sneezing, itching, ear ache,  +nasal congestion, post nasal drip,   CV:  No chest pain,  Orthopnea, PND, swelling in lower extremities, anasarca, dizziness, palpitations, syncope.   GI  No heartburn, indigestion, abdominal pain, nausea, vomiting, diarrhea, change in bowel habits, loss of appetite, bloody stools.   Resp:  .  No chest wall deformity  Skin: no rash or lesions.  GU: no dysuria, change in color of urine, no urgency or frequency.  No flank pain, no hematuria   MS:  No joint pain or swelling.  No decreased range of motion.  No back pain.  Psych:  No change in mood or affect. No depression or anxiety.  No memory loss.         Objective:   Physical Exam BP 136/82 mmHg  Pulse 62  Temp(Src) 98 F (36.7 C) (Oral)  Ht 5\' 6"  (1.676 m)  Wt 153 lb 3.2 oz (69.491 kg)  BMI 24.74 kg/m2  SpO2 97%  GEN: A/Ox3; pleasant , NAD, elderly   HEENT:  [place]/AT,  EACs-clear, TMs-wnl, NOSE-clear drainage  THROAT-clear, no  lesions, no postnasal drip or exudate noted.   NECK:  Supple w/ fair ROM; no JVD; normal carotid impulses w/o bruits; no thyromegaly or nodules palpated; no lymphadenopathy.  RESP  Decreased BS in bases , no accessory muscle use, no dullness to percussion  CARD:  RRR, no m/r/g  , no peripheral edema, pulses intact, no cyanosis or clubbing.  GI:   Soft & nt; nml bowel sounds; no organomegaly or masses detected.  Musco: Warm bil, no deformities or joint swelling noted.   Neuro: alert, no focal deficits noted.    Skin: Warm, no lesions or rashes   amb sats 99%.  HR 110-130 with min walking      Assessment & Plan:   COPD (chronic obstructive pulmonary disease) Stable Gold C Copd with upper airway irritation of dried powder tudorza in setting of ill fitting dentures and oral irritations. On exertional prn oxygen and qhs oxygen therapy for nocturnal hypoxemia  Plan Hold Tudorza until new dentures made Xopenex as needed Cont oxygen qhs and prn daytime: This patient continues to use and benefit from her oxygen therapy and has re qualified at this visit for continued oxygen  therapy  No other changes Return 4 months     Updated Medication List Outpatient Encounter Prescriptions as of 04/28/2014  Medication Sig  . Aclidinium Bromide (TUDORZA PRESSAIR) 400 MCG/ACT AEPB HOLD for two weeks and resume once new dentures made  . ALPRAZolam (XANAX) 0.25 MG tablet Take 0.25 mg by mouth 2 (two) times daily as needed for anxiety or sleep.  [name] apixaban (ELIQUIS) 5 MG TABS tablet Take 1 tablet (5 mg total) by mouth 2 (two) times daily.  . cholecalciferol (VITAMIN D) 1000 UNITS tablet Take 1,000 Units by mouth daily.  [name] dofetilide (TIKOSYN) 125 MCG capsule Take 1 capsule (125 mcg total) by mouth 2 (two) times daily.  [name] esomeprazole (NEXIUM) 40 MG capsule Take 40 mg by mouth daily before breakfast.    . furosemide (LASIX) 20 MG tablet Take 40mg  daily (Patient taking differently: Take 40 mg by  mouth daily. Take 40mg  daily)  . levalbuterol (XOPENEX HFA) 45 MCG/ACT inhaler Inhale 2 puffs into the lungs every 6 (six) hours as needed for wheezing.  [name] levothyroxine (SYNTHROID, LEVOTHROID) 25 MCG tablet Take 25 mcg by mouth daily before breakfast.   . metoprolol succinate (TOPROL-XL) 25 MG 24 hr tablet Take 1 tablet (25 mg total) by mouth 2 (two) times daily.  . nitroGLYCERIN (NITROSTAT) 0.4 MG SL tablet Place 0.4 mg under the tongue every 5 (five) minutes as needed for chest pain.  [name] Glycol-Propyl Glycol (SYSTANE OP) Place 1 drop into both eyes daily.   . Potassium Chloride ER 20 MEQ TBCR Take 40 mEq by mouth daily.  . traMADol (ULTRAM) 50 MG tablet Take 1 tablet (50 mg total) by mouth every 6 (six) hours as needed. Pain  . [DISCONTINUED] Aclidinium Bromide (TUDORZA PRESSAIR) 400 MCG/ACT AEPB Inhale 1 puff into the lungs 2 (two) times daily.  . [DISCONTINUED] clindamycin (CLEOCIN) 300 MG capsule Take 1 capsule by mouth 3 (three) times daily.

## 2014-04-28 NOTE — Patient Instructions (Signed)
Hold Tudorza until new dentures made Xopenex as needed No other changes Return 4 months

## 2014-04-29 NOTE — Assessment & Plan Note (Signed)
Stable Gold C Copd with upper airway irritation of dried powder tudorza in setting of ill fitting dentures and oral irritations. On exertional prn oxygen and qhs oxygen therapy for nocturnal hypoxemia  Plan Hold Tudorza until new dentures made Xopenex as needed Cont oxygen qhs and prn daytime: This patient continues to use and benefit from her oxygen therapy and has re qualified at this visit for continued oxygen therapy  No other changes Return 4 months

## 2014-05-04 ENCOUNTER — Other Ambulatory Visit (INDEPENDENT_AMBULATORY_CARE_PROVIDER_SITE_OTHER): Payer: Medicare Other | Admitting: *Deleted

## 2014-05-04 DIAGNOSIS — I480 Paroxysmal atrial fibrillation: Secondary | ICD-10-CM | POA: Diagnosis not present

## 2014-05-04 LAB — CBC WITH DIFFERENTIAL/PLATELET
BASOS ABS: 0 10*3/uL (ref 0.0–0.1)
Basophils Relative: 0.8 % (ref 0.0–3.0)
EOS ABS: 0.2 10*3/uL (ref 0.0–0.7)
Eosinophils Relative: 3.6 % (ref 0.0–5.0)
HCT: 40.5 % (ref 36.0–46.0)
HEMOGLOBIN: 13.2 g/dL (ref 12.0–15.0)
Lymphocytes Relative: 34.3 % (ref 12.0–46.0)
Lymphs Abs: 2.1 10*3/uL (ref 0.7–4.0)
MCHC: 32.6 g/dL (ref 30.0–36.0)
MCV: 82.8 fl (ref 78.0–100.0)
Monocytes Absolute: 0.7 10*3/uL (ref 0.1–1.0)
Monocytes Relative: 11.5 % (ref 3.0–12.0)
Neutro Abs: 3 10*3/uL (ref 1.4–7.7)
Neutrophils Relative %: 49.8 % (ref 43.0–77.0)
PLATELETS: 314 10*3/uL (ref 150.0–400.0)
RBC: 4.89 Mil/uL (ref 3.87–5.11)
RDW: 16.2 % — ABNORMAL HIGH (ref 11.5–15.5)
WBC: 6 10*3/uL (ref 4.0–10.5)

## 2014-05-04 LAB — BASIC METABOLIC PANEL
BUN: 14 mg/dL (ref 6–23)
CALCIUM: 9.7 mg/dL (ref 8.4–10.5)
CO2: 25 mEq/L (ref 19–32)
Chloride: 104 mEq/L (ref 96–112)
Creatinine, Ser: 1.19 mg/dL (ref 0.40–1.20)
GFR: 46.68 mL/min — AB (ref >60.00)
Glucose, Bld: 79 mg/dL (ref 70–99)
Potassium: 4.5 mEq/L (ref 3.5–5.1)
Sodium: 139 mEq/L (ref 135–145)

## 2014-05-10 ENCOUNTER — Encounter (HOSPITAL_COMMUNITY)
Admission: RE | Disposition: A | Discharge status: Home / Self Care | Payer: Self-pay | Source: Ambulatory Visit | Attending: Cardiology

## 2014-05-10 ENCOUNTER — Encounter (HOSPITAL_COMMUNITY): Payer: Self-pay | Admitting: *Deleted

## 2014-05-10 ENCOUNTER — Ambulatory Visit (HOSPITAL_COMMUNITY)
Admission: RE | Admit: 2014-05-10 | Discharge: 2014-05-10 | Disposition: A | Discharge status: Home / Self Care | Payer: Medicare Other | Source: Ambulatory Visit | Attending: Cardiology | Admitting: Cardiology

## 2014-05-10 ENCOUNTER — Encounter (HOSPITAL_COMMUNITY): Payer: Self-pay | Admitting: Certified Registered Nurse Anesthetist

## 2014-05-10 DIAGNOSIS — Z951 Presence of aortocoronary bypass graft: Secondary | ICD-10-CM | POA: Insufficient documentation

## 2014-05-10 DIAGNOSIS — E039 Hypothyroidism, unspecified: Secondary | ICD-10-CM | POA: Insufficient documentation

## 2014-05-10 DIAGNOSIS — J449 Chronic obstructive pulmonary disease, unspecified: Secondary | ICD-10-CM | POA: Insufficient documentation

## 2014-05-10 DIAGNOSIS — Z9981 Dependence on supplemental oxygen: Secondary | ICD-10-CM | POA: Insufficient documentation

## 2014-05-10 DIAGNOSIS — K219 Gastro-esophageal reflux disease without esophagitis: Secondary | ICD-10-CM | POA: Insufficient documentation

## 2014-05-10 DIAGNOSIS — I480 Paroxysmal atrial fibrillation: Secondary | ICD-10-CM | POA: Diagnosis not present

## 2014-05-10 DIAGNOSIS — I5032 Chronic diastolic (congestive) heart failure: Secondary | ICD-10-CM | POA: Insufficient documentation

## 2014-05-10 DIAGNOSIS — I10 Essential (primary) hypertension: Secondary | ICD-10-CM | POA: Insufficient documentation

## 2014-05-10 DIAGNOSIS — Z87891 Personal history of nicotine dependence: Secondary | ICD-10-CM | POA: Insufficient documentation

## 2014-05-10 DIAGNOSIS — F419 Anxiety disorder, unspecified: Secondary | ICD-10-CM | POA: Diagnosis not present

## 2014-05-10 DIAGNOSIS — I2510 Atherosclerotic heart disease of native coronary artery without angina pectoris: Secondary | ICD-10-CM | POA: Diagnosis not present

## 2014-05-10 DIAGNOSIS — E785 Hyperlipidemia, unspecified: Secondary | ICD-10-CM | POA: Diagnosis not present

## 2014-05-10 DIAGNOSIS — I350 Nonrheumatic aortic (valve) stenosis: Secondary | ICD-10-CM

## 2014-05-10 DIAGNOSIS — I4510 Unspecified right bundle-branch block: Secondary | ICD-10-CM | POA: Insufficient documentation

## 2014-05-10 HISTORY — PX: TEE WITHOUT CARDIOVERSION: SHX5443

## 2014-05-10 SURGERY — ECHOCARDIOGRAM, TRANSESOPHAGEAL
Anesthesia: Moderate Sedation

## 2014-05-10 MED ORDER — BUTAMBEN-TETRACAINE-BENZOCAINE 2-2-14 % EX AERO
INHALATION_SPRAY | CUTANEOUS | Status: DC | PRN
  Administered 2014-05-10: 2 via TOPICAL

## 2014-05-10 MED ORDER — FENTANYL CITRATE 0.05 MG/ML IJ SOLN
INTRAMUSCULAR | Status: AC
  Filled 2014-05-10: qty 2

## 2014-05-10 MED ORDER — FENTANYL CITRATE 0.05 MG/ML IJ SOLN
INTRAMUSCULAR | Status: DC | PRN
  Administered 2014-05-10 (×2): 12.5 ug via INTRAVENOUS
  Administered 2014-05-10: 25 ug via INTRAVENOUS

## 2014-05-10 MED ORDER — SODIUM CHLORIDE 0.9 % IV SOLN
INTRAVENOUS | Status: DC
  Administered 2014-05-10: 07:00:00 via INTRAVENOUS

## 2014-05-10 MED ORDER — MIDAZOLAM HCL 5 MG/ML IJ SOLN
INTRAMUSCULAR | Status: AC
  Filled 2014-05-10: qty 2

## 2014-05-10 MED ORDER — MIDAZOLAM HCL 10 MG/2ML IJ SOLN
INTRAMUSCULAR | Status: DC | PRN
  Administered 2014-05-10 (×2): 2 mg via INTRAVENOUS
  Administered 2014-05-10: 1 mg via INTRAVENOUS

## 2014-05-10 NOTE — H&P (Signed)
Date: 05/10/2014   ID: Renee [name], DOB 02-Jan-1935, MRN [id number]  PCP: [name], MD Cardiologist: Dr Martinique Primary Electrophysiologist: Dr Caryl Comes  Chief Complaint  Patient presents with    PAF    History of Present Illness: Renee [name] is a 80 y.o. female who presents today for TEE prior to undergoing Afib ablation.   She has had atrial fibrillation for several years. This was initially diagnosed after CABG in 2011. She was placed on amiodarone but did not tolerate this medicine due to worsening SOB. Due to afib with RVR, she has been treated with both cardizem and metoprolol. She has not tolerated diltiazem very well. She was evaluated by Dr Caryl Comes and placed on tikosyn 10/15. She does not feel that she has tolerated tikosyn. She reports symptoms of throat discomfort as well as fatigue. She has ongoing palpitations which appear to be primarily pacs and pvcs. She is not very active. She has chronic lung disease with poor exercise effort.  Today, she denies symptoms of chest pain, orthopnea, PND, lower extremity edema, claudication, dizziness, presyncope, syncope, bleeding, or neurologic sequela. The patient is tolerating medications without difficulties and is otherwise without complaint today.    Past Medical History  Diagnosis Date  . GERD (gastroesophageal reflux disease)   . IBS (irritable bowel syndrome)   . RBBB   . Coronary artery disease     a. CABG 09/2009. b. normal myoview in 05/2012.   [name] Anxiety   . PAF (paroxysmal atrial fibrillation)     a. Post op after CABG. Intolerant of amio in the past. b. Recurrence - started on Tikosyn 11/2013.  [name] Chronic diastolic CHF (congestive heart failure)   . Dysphagia   . Colon polyps   . Hypertension   . HLD (hyperlipidemia)   . Hypothyroidism   . Chronic bronchitis   . Emphysema/COPD     on home O2  . Sciatic  nerve pain   . Orthostatic hypotension   . PVC's (premature ventricular contractions)     a. Holter 11/2013: no AF, no SVT, rare PVC couplet, occasional PACs.  . Premature atrial contractions     a. Holter 11/2013: no AF, no SVT, rare PVC couplet, occasional PACs.  . Chronic respiratory failure   . Bradycardia    Past Surgical History  Procedure Laterality Date  . Total hip arthroplasty Right 11/01/2008  . Appendectomy  1995  . Tonsillectomy and adenoidectomy    . Joint replacement    . Coronary artery bypass graft  09/21/2009    CABG X2  . Cardiac catheterization  1990's; 06/2009  . Cholecystectomy  2008  . Larynx surgery  ~ 1960    "tumor removed"  . Total abdominal hysterectomy  1986  . Tubal ligation  1973  . Cataract extraction w/ intraocular lens implant, bilateral Bilateral   . Eye surgery    . Retinal laser procedure Left 10/19/2013    "had a wrinkle in it"     Current Outpatient Prescriptions  Medication Sig Dispense Refill  . Aclidinium Bromide (TUDORZA PRESSAIR) 400 MCG/ACT AEPB Inhale 1 puff into the lungs 2 (two) times daily. 3 each 2  . ALPRAZolam (XANAX) 0.25 MG tablet Take 0.25 mg by mouth 2 (two) times daily as needed for anxiety or sleep.    [name] apixaban (ELIQUIS) 5 MG TABS tablet Take 1 tablet (5 mg total) by mouth 2 (two) times daily. 28 tablet 0  . cholecalciferol (VITAMIN D)  1000 UNITS tablet Take 1,000 Units by mouth daily.    . clindamycin (CLEOCIN) 300 MG capsule Take 1 capsule by mouth 3 (three) times daily.  0  . dofetilide (TIKOSYN) 125 MCG capsule Take 1 capsule (125 mcg total) by mouth 2 (two) times daily. 60 capsule 6  . esomeprazole (NEXIUM) 40 MG capsule Take 40 mg by mouth daily before breakfast.     . furosemide (LASIX) 20 MG tablet Take 40mg  daily (Patient taking differently: Take 40 mg by mouth daily. Take 40mg   daily) 60 tablet 11  . levalbuterol (XOPENEX HFA) 45 MCG/ACT inhaler Inhale 2 puffs into the lungs every 6 (six) hours as needed for wheezing.    [name] levothyroxine (SYNTHROID, LEVOTHROID) 25 MCG tablet Take 25 mcg by mouth daily before breakfast.     . metoprolol succinate (TOPROL-XL) 25 MG 24 hr tablet Take 1 tablet (25 mg total) by mouth 2 (two) times daily. 30 tablet 6  . nitroGLYCERIN (NITROSTAT) 0.4 MG SL tablet Place 0.4 mg under the tongue every 5 (five) minutes as needed for chest pain.    [name] Glycol-Propyl Glycol (SYSTANE OP) Place 1 drop into both eyes daily.     . Potassium Chloride ER 20 MEQ TBCR Take 40 mEq by mouth daily. 90 tablet 3  . traMADol (ULTRAM) 50 MG tablet Take 1 tablet (50 mg total) by mouth every 6 (six) hours as needed. Pain 30 tablet 0  . [DISCONTINUED] budesonide-formoterol (SYMBICORT) 160-4.5 MCG/ACT inhaler Inhale 2 puffs into the lungs 2 (two) times daily. 1 Inhaler 12  . [DISCONTINUED] calcium citrate-vitamin D (CITRACAL+D) 315-200 MG-UNIT per tablet Take 1 tablet by mouth 2 (two) times daily.      No current facility-administered medications for this visit.    Allergies: Spiriva; Statins; Levofloxacin; Budesonide-formoterol fumarate; Morphine; Penicillins; Phenazopyridine hcl; Pneumococcal vaccine; Pneumococcal vaccines; Prednisone; Prednisone; Sulfonamide derivatives; Tylenol; Penicillins; and Sulfa antibiotics   Social History: The patient  reports that she quit smoking about 4 years ago. Her smoking use included Cigarettes. She has a 50 pack-year smoking history. She has never used smokeless tobacco. She reports that she does not drink alcohol or use illicit drugs.   Family History: The patient's family history includes Breast cancer in her sister; Cancer in her sister; Colon cancer in her daughter; Colon polyps in her brother; Deep vein thrombosis in her mother; Diabetes in her father; Heart attack  in her father; Heart disease in her father and mother; Hyperlipidemia in her father; Hypertension in her brother, father, and mother; Other in her sister; Transient ischemic attack in her mother.    ROS: Please see the history of present illness. All other systems are reviewed and negative.    PHYSICAL EXAM: VS: BP 128/82 mmHg  Pulse 79  Ht 5\' 6"  (1.676 m)  Wt 156 lb (70.761 kg)  BMI 25.19 kg/m2 , BMI Body mass index is 25.19 kg/(m^2). GEN: Well nourished, well developed, in no acute distress  HEENT: normal  Neck: no JVD, carotid bruits, or masses Cardiac: RRR; no murmurs, rubs, or gallops,no edema  Respiratory: clear to auscultation bilaterally, normal work of breathing GI: soft, nontender, nondistended, + BS MS: no deformity or atrophy  Skin: warm and dry  Neuro: Strength and sensation are intact Psych: anxious  EKG: EKG is ordered today. The ekg ordered today shows sinus rhythm with nonsustained atach   Recent Labs: 11/30/2013: Pro B Natriuretic peptide (BNP) 250.9 12/16/2013: Magnesium 2.1 01/18/2014: Hemoglobin 13.1; Platelets 293.0; TSH 1.98 02/23/2014: BUN  12; Creatinine 1.14; Potassium 3.7; Sodium 138    Lipid Panel   Labs (Brief)       Component Value Date/Time   CHOL * 07/16/2007 0030    207  ATP III CLASSIFICATION: <200 mg/dL Desirable 200-239 mg/dL Borderline High >=240 mg/dL High   TRIG 116 07/16/2007 0030   HDL 45 07/16/2007 0030   CHOLHDL 4.6 07/16/2007 0030   VLDL 23 07/16/2007 0030   LDLCALC * 07/16/2007 0030    139  Total Cholesterol/HDL:CHD Risk Coronary Heart Disease Risk Table  Men Women 1/2 Average Risk 3.4 3.3       Wt Readings from Last 3 Encounters:  04/05/14 156 lb (70.761 kg)  03/15/14 155 lb 12.8 oz (70.67 kg)  02/01/14 155 lb (70.308 kg)   ASSESSMENT AND PLAN:  1. paroxysmal atrial fibrillation The patient has  symptomatic recurrent atrial fibrillation. She has failed medical therapy with amiodarone and tikosyn (due to symptoms). I am not convinced that all of her problems are due to either afib or tikosyn but suspect that her quality of life may be improved with reduction in afib and possibly discontinuing tikosyn.  Therapeutic strategies for afib including medicine and ablation were discussed in detail with the patient by Dr. Rayann Heman. Risk, benefits, and alternatives to EP study and radiofrequency ablation for afib were also discussed in detail by Dr. Rayann Heman as well at last OV. These risks include but are not limited to stroke, bleeding, vascular damage, tamponade, perforation, damage to the esophagus, lungs, and other structures, pulmonary vein stenosis, worsening renal function, and death. She understands that her pulmonary issues increase her risks with anesthesia. The patient understands these risk and wishes to proceed. She is here for TEE prior to Afib albation.  2. CAD Stable without ischemic symptoms No change required   3. HTN Stable No change required    Signed, Fransico Him, MD [place] [phone] (office) [phone] (fax)

## 2014-05-10 NOTE — CV Procedure (Signed)
    PROCEDURE NOTE  Procedure:  Transesophageal echocardiogram with colorflow dopper and agitated saline contrast injection Operator: Fransico Him, MD Indications:  Atrial fibrillation pre ablation Complications:  None IV Meds: Versed 5mg , Fenatyl 29mcg IV  Results: Normal LV size and function Normal RV size and function Normal RA Mildly dilated LA.  No evidence of thrombus in LAA or LA Normal TV with mild TR Normal PV Normal MV with mild MR Normal trileaflet AV with trivial AR Lipomatous interatrial septum with no evidence of interatrial shunt by colorflow doppler or agitated saline contrast injection Mild atheromatous plaque in the thoracic aorta  The patient tolerated the procedure well without complications and was transferred back to her room in stable condition.  Signed: Fransico Him, MD [facility] 05/10/2014

## 2014-05-10 NOTE — CV Procedure (Signed)
Expand All Collapse All      Electrophysiology Office Note   Date: 04/05/2014   ID: Renee [name], Renee [name] 02-08-1934, MRN [id number]  PCP: [name], MD Cardiologist: Dr [place] Primary Electrophysiologist: Dr Caryl Comes  Chief Complaint  Patient presents with  . Appointment    PAF    History of Present Illness: Renee [name] is a 80 y.o. female who presents today for electrophysiology evaluation of her afib. She has had atrial fibrillation for several years. This was initially diagnosed after CABG in 2011. She was placed on amiodarone but did not tolerate this medicine due to worsening SOB. Due to afib with RVR, she has been treated with both cardizem and metoprolol. She has not tolerated diltiazem very well. She was evaluated by Dr Caryl Comes and placed on tikosyn 10/15. She does not feel that she has tolerated tikosyn. She reports symptoms of throat discomfort as well as fatigue. She has ongoing palpitations which appear to be primarily pacs and pvcs. She is not very active. She has chronic lung disease with poor exercise effort.  Today, she denies symptoms of chest pain, orthopnea, PND, lower extremity edema, claudication, dizziness, presyncope, syncope, bleeding, or neurologic sequela. The patient is tolerating medications without difficulties and is otherwise without complaint today.    Past Medical History  Diagnosis Date  . GERD (gastroesophageal reflux disease)   . IBS (irritable bowel syndrome)   . RBBB   . Coronary artery disease     a. CABG 09/2009. b. normal myoview in 05/2012.   [name] Anxiety   . PAF (paroxysmal atrial fibrillation)     a. Post op after CABG. Intolerant of amio in the past. b. Recurrence - started on Tikosyn 11/2013.  [name] Chronic diastolic CHF (congestive heart failure)   . Dysphagia   . Colon polyps   . Hypertension   . HLD (hyperlipidemia)   . Hypothyroidism   .  Chronic bronchitis   . Emphysema/COPD     on home O2  . Sciatic nerve pain   . Orthostatic hypotension   . PVC's (premature ventricular contractions)     a. Holter 11/2013: no AF, no SVT, rare PVC couplet, occasional PACs.  . Premature atrial contractions     a. Holter 11/2013: no AF, no SVT, rare PVC couplet, occasional PACs.  . Chronic respiratory failure   . Bradycardia    Past Surgical History  Procedure Laterality Date  . Total hip arthroplasty Right 11/01/2008  . Appendectomy  1995  . Tonsillectomy and adenoidectomy    . Joint replacement    . Coronary artery bypass graft  09/21/2009    CABG X2  . Cardiac catheterization  1990's; 06/2009  . Cholecystectomy  2008  . Larynx surgery  ~ 1960    "tumor removed"  . Total abdominal hysterectomy  1986  . Tubal ligation  1973  . Cataract extraction w/ intraocular lens implant, bilateral Bilateral   . Eye surgery    . Retinal laser procedure Left 10/19/2013    "had a wrinkle in it"     Current Outpatient Prescriptions  Medication Sig Dispense Refill  . Aclidinium Bromide (TUDORZA PRESSAIR) 400 MCG/ACT AEPB Inhale 1 puff into the lungs 2 (two) times daily. 3 each 2  . ALPRAZolam (XANAX) 0.25 MG tablet Take 0.25 mg by mouth 2 (two) times daily as needed for anxiety or sleep.    [name] apixaban (ELIQUIS) 5 MG TABS tablet Take 1 tablet (5 mg total) by mouth 2 (two) times daily.  28 tablet 0  . cholecalciferol (VITAMIN D) 1000 UNITS tablet Take 1,000 Units by mouth daily.    . clindamycin (CLEOCIN) 300 MG capsule Take 1 capsule by mouth 3 (three) times daily.  0  . dofetilide (TIKOSYN) 125 MCG capsule Take 1 capsule (125 mcg total) by mouth 2 (two) times daily. 60 capsule 6  . esomeprazole (NEXIUM) 40 MG capsule Take 40 mg by mouth daily before breakfast.     . furosemide (LASIX) 20 MG tablet Take 40mg   daily (Patient taking differently: Take 40 mg by mouth daily. Take 40mg  daily) 60 tablet 11  . levalbuterol (XOPENEX HFA) 45 MCG/ACT inhaler Inhale 2 puffs into the lungs every 6 (six) hours as needed for wheezing.    [name] levothyroxine (SYNTHROID, LEVOTHROID) 25 MCG tablet Take 25 mcg by mouth daily before breakfast.     . metoprolol succinate (TOPROL-XL) 25 MG 24 hr tablet Take 1 tablet (25 mg total) by mouth 2 (two) times daily. 30 tablet 6  . nitroGLYCERIN (NITROSTAT) 0.4 MG SL tablet Place 0.4 mg under the tongue every 5 (five) minutes as needed for chest pain.    [name] Glycol-Propyl Glycol (SYSTANE OP) Place 1 drop into both eyes daily.     . Potassium Chloride ER 20 MEQ TBCR Take 40 mEq by mouth daily. 90 tablet 3  . traMADol (ULTRAM) 50 MG tablet Take 1 tablet (50 mg total) by mouth every 6 (six) hours as needed. Pain 30 tablet 0  . [DISCONTINUED] budesonide-formoterol (SYMBICORT) 160-4.5 MCG/ACT inhaler Inhale 2 puffs into the lungs 2 (two) times daily. 1 Inhaler 12  . [DISCONTINUED] calcium citrate-vitamin D (CITRACAL+D) 315-200 MG-UNIT per tablet Take 1 tablet by mouth 2 (two) times daily.      No current facility-administered medications for this visit.    Allergies: Spiriva; Statins; Levofloxacin; Budesonide-formoterol fumarate; Morphine; Penicillins; Phenazopyridine hcl; Pneumococcal vaccine; Pneumococcal vaccines; Prednisone; Prednisone; Sulfonamide derivatives; Tylenol; Penicillins; and Sulfa antibiotics   Social History: The patient  reports that she quit smoking about 4 years ago. Her smoking use included Cigarettes. She has a 50 pack-year smoking history. She has never used smokeless tobacco. She reports that she does not drink alcohol or use illicit drugs.   Family History: The patient's family history includes Breast cancer in her sister; Cancer in her sister; Colon cancer in her daughter; Colon polyps in her brother;  Deep vein thrombosis in her mother; Diabetes in her father; Heart attack in her father; Heart disease in her father and mother; Hyperlipidemia in her father; Hypertension in her brother, father, and mother; Other in her sister; Transient ischemic attack in her mother.    ROS: Please see the history of present illness. All other systems are reviewed and negative.    PHYSICAL EXAM: VS: BP 128/82 mmHg  Pulse 79  Ht 5\' 6"  (1.676 m)  Wt 156 lb (70.761 kg)  BMI 25.19 kg/m2 , BMI Body mass index is 25.19 kg/(m^2). GEN: Well nourished, well developed, in no acute distress  HEENT: normal  Neck: no JVD, carotid bruits, or masses Cardiac: RRR; no murmurs, rubs, or gallops,no edema  Respiratory: clear to auscultation bilaterally, normal work of breathing GI: soft, nontender, nondistended, + BS MS: no deformity or atrophy  Skin: warm and dry  Neuro: Strength and sensation are intact Psych: anxious  EKG: EKG is ordered today. The ekg ordered today shows sinus rhythm with nonsustained atach   Recent Labs: 11/30/2013: Pro B Natriuretic peptide (BNP) 250.9 12/16/2013: Magnesium 2.1 01/18/2014:  Hemoglobin 13.1; Platelets 293.0; TSH 1.98 02/23/2014: BUN 12; Creatinine 1.14; Potassium 3.7; Sodium 138    Lipid Panel   Labs (Brief)       Component Value Date/Time   CHOL * 07/16/2007 0030    207  ATP III CLASSIFICATION: <200 mg/dL Desirable 200-239 mg/dL Borderline High >=240 mg/dL High   TRIG 116 07/16/2007 0030   HDL 45 07/16/2007 0030   CHOLHDL 4.6 07/16/2007 0030   VLDL 23 07/16/2007 0030   LDLCALC * 07/16/2007 0030    139  Total Cholesterol/HDL:CHD Risk Coronary Heart Disease Risk Table  Men Women 1/2 Average Risk 3.4 3.3       Wt Readings from Last 3 Encounters:  04/05/14 156 lb (70.761 kg)  03/15/14 155 lb 12.8 oz (70.67 kg)  02/01/14 155 lb (70.308 kg)    ASSESSMENT AND PLAN:  1. paroxysmal atrial fibrillation The patient has symptomatic recurrent atrial fibrillation. She has failed medical therapy with amiodarone and tikosyn (due to symptoms). I am not convinced that all of her problems are due to either afib or tikosyn but suspect that her quality of life may be improved with reduction in afib and possibly discontinuing tikosyn.  Therapeutic strategies for afib including medicine and ablation were discussed in detail with the patient by Dr. Rayann Heman. Risk, benefits, and alternatives to EP study and radiofrequency ablation for afib were also discussed in detail by Dr. Rayann Heman as well at last OV. These risks include but are not limited to stroke, bleeding, vascular damage, tamponade, perforation, damage to the esophagus, lungs, and other structures, pulmonary vein stenosis, worsening renal function, and death. She understands that her pulmonary issues increase her risks with anesthesia. The patient understands these risk and wishes to proceed. She is here for TEE prior to Afib albation.  2. CAD Stable without ischemic symptoms No change required   3. HTN Stable No change required    Signed, Fransico Him, MD [place] [address] [phone] (office) [phone] (fax)           Expand All Collapse All

## 2014-05-10 NOTE — Discharge Instructions (Signed)

## 2014-05-10 NOTE — Interval H&P Note (Signed)
History and Physical Interval Note:  05/10/2014 8:26 AM  Renee [name]  has presented today for surgery, with the diagnosis of AFIB  The various methods of treatment have been discussed with the patient and family. After consideration of risks, benefits and other options for treatment, the patient has consented to  Procedure(s): TRANSESOPHAGEAL ECHOCARDIOGRAM (TEE) (N/A) as a surgical intervention .  The patient's history has been reviewed, patient examined, no change in status, stable for surgery.  I have reviewed the patient's chart and labs.  Questions were answered to the patient's satisfaction.     TURNER,TRACI R

## 2014-05-10 NOTE — Progress Notes (Signed)
  Echocardiogram Echocardiogram Transesophageal has been performed.  Jovi Alvizo [name] 05/10/2014, 8:56 AM

## 2014-05-11 ENCOUNTER — Ambulatory Visit (HOSPITAL_COMMUNITY): Payer: Medicare Other | Admitting: Certified Registered Nurse Anesthetist

## 2014-05-11 ENCOUNTER — Encounter (HOSPITAL_COMMUNITY)
Admission: RE | Disposition: A | Discharge status: Home / Self Care | Payer: Self-pay | Source: Ambulatory Visit | Attending: Internal Medicine

## 2014-05-11 ENCOUNTER — Ambulatory Visit (HOSPITAL_COMMUNITY)
Admission: RE | Admit: 2014-05-11 | Discharge: 2014-05-12 | Disposition: A | Discharge status: Home / Self Care | Payer: Medicare Other | Source: Ambulatory Visit | Attending: Internal Medicine | Admitting: Internal Medicine

## 2014-05-11 ENCOUNTER — Encounter (HOSPITAL_COMMUNITY): Payer: Self-pay | Admitting: Cardiology

## 2014-05-11 DIAGNOSIS — I2510 Atherosclerotic heart disease of native coronary artery without angina pectoris: Secondary | ICD-10-CM | POA: Insufficient documentation

## 2014-05-11 DIAGNOSIS — K589 Irritable bowel syndrome without diarrhea: Secondary | ICD-10-CM | POA: Insufficient documentation

## 2014-05-11 DIAGNOSIS — I10 Essential (primary) hypertension: Secondary | ICD-10-CM | POA: Insufficient documentation

## 2014-05-11 DIAGNOSIS — Z87891 Personal history of nicotine dependence: Secondary | ICD-10-CM | POA: Insufficient documentation

## 2014-05-11 DIAGNOSIS — R509 Fever, unspecified: Secondary | ICD-10-CM | POA: Insufficient documentation

## 2014-05-11 DIAGNOSIS — K219 Gastro-esophageal reflux disease without esophagitis: Secondary | ICD-10-CM | POA: Diagnosis not present

## 2014-05-11 DIAGNOSIS — Z951 Presence of aortocoronary bypass graft: Secondary | ICD-10-CM | POA: Diagnosis not present

## 2014-05-11 DIAGNOSIS — I4891 Unspecified atrial fibrillation: Secondary | ICD-10-CM | POA: Diagnosis present

## 2014-05-11 DIAGNOSIS — I5032 Chronic diastolic (congestive) heart failure: Secondary | ICD-10-CM | POA: Insufficient documentation

## 2014-05-11 DIAGNOSIS — I480 Paroxysmal atrial fibrillation: Secondary | ICD-10-CM | POA: Diagnosis present

## 2014-05-11 DIAGNOSIS — Z9981 Dependence on supplemental oxygen: Secondary | ICD-10-CM | POA: Diagnosis not present

## 2014-05-11 DIAGNOSIS — J449 Chronic obstructive pulmonary disease, unspecified: Secondary | ICD-10-CM | POA: Diagnosis not present

## 2014-05-11 DIAGNOSIS — E039 Hypothyroidism, unspecified: Secondary | ICD-10-CM | POA: Diagnosis not present

## 2014-05-11 DIAGNOSIS — I4510 Unspecified right bundle-branch block: Secondary | ICD-10-CM | POA: Insufficient documentation

## 2014-05-11 DIAGNOSIS — J9610 Chronic respiratory failure, unspecified whether with hypoxia or hypercapnia: Secondary | ICD-10-CM | POA: Diagnosis not present

## 2014-05-11 DIAGNOSIS — E785 Hyperlipidemia, unspecified: Secondary | ICD-10-CM | POA: Insufficient documentation

## 2014-05-11 HISTORY — DX: Cerebral aneurysm, nonruptured: I67.1

## 2014-05-11 HISTORY — PX: ATRIAL FIBRILLATION ABLATION: SHX5456

## 2014-05-11 LAB — POCT ACTIVATED CLOTTING TIME
Activated Clotting Time: 325 seconds
Activated Clotting Time: 288 seconds
Activated Clotting Time: 319 seconds
Activated Clotting Time: 190 seconds
Activated Clotting Time: 196 seconds

## 2014-05-11 LAB — MRSA PCR SCREENING: MRSA by PCR: NEGATIVE

## 2014-05-11 SURGERY — ATRIAL FIBRILLATION ABLATION
Anesthesia: General

## 2014-05-11 MED ORDER — SODIUM CHLORIDE 0.9 % IV SOLN
INTRAVENOUS | Status: DC | PRN
  Administered 2014-05-11: 07:00:00 via INTRAVENOUS

## 2014-05-11 MED ORDER — HEPARIN SODIUM (PORCINE) 1000 UNIT/ML IJ SOLN
INTRAMUSCULAR | Status: AC
  Filled 2014-05-11: qty 1

## 2014-05-11 MED ORDER — FENTANYL CITRATE 0.05 MG/ML IJ SOLN
25.0000 ug | Freq: Once | INTRAMUSCULAR | Status: AC
  Administered 2014-05-11: 25 ug via INTRAVENOUS

## 2014-05-11 MED ORDER — ALPRAZOLAM 0.25 MG PO TABS
ORAL_TABLET | ORAL | Status: AC
  Filled 2014-05-11: qty 1

## 2014-05-11 MED ORDER — ONDANSETRON HCL 4 MG/2ML IJ SOLN: 4.0000 mg | Freq: Four times a day (QID) | INTRAMUSCULAR | Status: DC | PRN

## 2014-05-11 MED ORDER — ALBUMIN HUMAN 5 % IV SOLN
INTRAVENOUS | Status: DC | PRN
  Administered 2014-05-11: 09:00:00 via INTRAVENOUS

## 2014-05-11 MED ORDER — MIDAZOLAM HCL 5 MG/5ML IJ SOLN
INTRAMUSCULAR | Status: DC | PRN
Start: 2014-05-11 — End: 2014-05-11
  Administered 2014-05-11 (×2): 1 mg via INTRAVENOUS

## 2014-05-11 MED ORDER — DOBUTAMINE IN D5W 4-5 MG/ML-% IV SOLN
INTRAVENOUS | Status: AC
  Filled 2014-05-11: qty 250

## 2014-05-11 MED ORDER — FENTANYL CITRATE 0.05 MG/ML IJ SOLN
INTRAMUSCULAR | Status: AC
  Filled 2014-05-11: qty 2

## 2014-05-11 MED ORDER — OXYCODONE HCL 5 MG/5ML PO SOLN: 5.0000 mg | Freq: Once | ORAL | Status: AC | PRN

## 2014-05-11 MED ORDER — POTASSIUM CHLORIDE CRYS ER 20 MEQ PO TBCR
40.0000 meq | EXTENDED_RELEASE_TABLET | Freq: Every day | ORAL | Status: DC
  Administered 2014-05-12: 40 meq via ORAL
  Filled 2014-05-11: qty 2

## 2014-05-11 MED ORDER — OXYCODONE HCL 5 MG PO TABS: 5.0000 mg | ORAL_TABLET | Freq: Once | ORAL | Status: AC | PRN

## 2014-05-11 MED ORDER — FUROSEMIDE 40 MG PO TABS
40.0000 mg | ORAL_TABLET | Freq: Every day | ORAL | Status: DC
  Administered 2014-05-12: 40 mg via ORAL
  Filled 2014-05-11: qty 1

## 2014-05-11 MED ORDER — PROTAMINE SULFATE 10 MG/ML IV SOLN
INTRAVENOUS | Status: DC | PRN
  Administered 2014-05-11: 30 mg via INTRAVENOUS

## 2014-05-11 MED ORDER — ONDANSETRON HCL 4 MG/2ML IJ SOLN
INTRAMUSCULAR | Status: DC | PRN
  Administered 2014-05-11: 4 mg via INTRAVENOUS

## 2014-05-11 MED ORDER — SODIUM CHLORIDE 0.9 % IJ SOLN
3.0000 mL | Freq: Two times a day (BID) | INTRAMUSCULAR | Status: DC
  Administered 2014-05-11 (×2): 3 mL via INTRAVENOUS

## 2014-05-11 MED ORDER — METOPROLOL SUCCINATE ER 25 MG PO TB24
25.0000 mg | ORAL_TABLET | Freq: Two times a day (BID) | ORAL | Status: DC
  Administered 2014-05-12: 25 mg via ORAL
  Filled 2014-05-11 (×2): qty 1

## 2014-05-11 MED ORDER — FENTANYL CITRATE 0.05 MG/ML IJ SOLN
INTRAMUSCULAR | Status: DC | PRN
  Administered 2014-05-11: 50 ug via INTRAVENOUS
  Administered 2014-05-11: 25 ug via INTRAVENOUS
  Administered 2014-05-11: 50 ug via INTRAVENOUS
  Administered 2014-05-11: 25 ug via INTRAVENOUS

## 2014-05-11 MED ORDER — DOFETILIDE 125 MCG PO CAPS
125.0000 ug | ORAL_CAPSULE | Freq: Two times a day (BID) | ORAL | Status: DC
  Administered 2014-05-11 – 2014-05-12 (×2): 125 ug via ORAL
  Filled 2014-05-11 (×4): qty 1

## 2014-05-11 MED ORDER — TRAMADOL HCL 50 MG PO TABS
50.0000 mg | ORAL_TABLET | Freq: Four times a day (QID) | ORAL | Status: DC | PRN
  Administered 2014-05-11 – 2014-05-12 (×3): 50 mg via ORAL
  Filled 2014-05-11 (×4): qty 1

## 2014-05-11 MED ORDER — SODIUM CHLORIDE 0.9 % IJ SOLN: 3.0000 mL | INTRAMUSCULAR | Status: DC | PRN

## 2014-05-11 MED ORDER — APIXABAN 5 MG PO TABS
5.0000 mg | ORAL_TABLET | Freq: Two times a day (BID) | ORAL | Status: DC
  Administered 2014-05-11 – 2014-05-12 (×2): 5 mg via ORAL
  Filled 2014-05-11 (×3): qty 1

## 2014-05-11 MED ORDER — LEVOTHYROXINE SODIUM 25 MCG PO TABS
25.0000 ug | ORAL_TABLET | Freq: Every day | ORAL | Status: DC
  Administered 2014-05-12: 25 ug via ORAL
  Filled 2014-05-11 (×2): qty 1

## 2014-05-11 MED ORDER — PANTOPRAZOLE SODIUM 40 MG PO TBEC
40.0000 mg | DELAYED_RELEASE_TABLET | Freq: Every day | ORAL | Status: DC
  Administered 2014-05-11 – 2014-05-12 (×2): 40 mg via ORAL
  Filled 2014-05-11 (×2): qty 1

## 2014-05-11 MED ORDER — LACTATED RINGERS IV SOLN: INTRAVENOUS | Status: DC | PRN

## 2014-05-11 MED ORDER — PHENOL 1.4 % MT LIQD
1.0000 | OROMUCOSAL | Status: DC | PRN
  Filled 2014-05-11: qty 177

## 2014-05-11 MED ORDER — ALPRAZOLAM 0.25 MG PO TABS
0.2500 mg | ORAL_TABLET | Freq: Two times a day (BID) | ORAL | Status: DC | PRN
  Administered 2014-05-11: 0.25 mg via ORAL

## 2014-05-11 MED ORDER — PROPOFOL 10 MG/ML IV BOLUS
INTRAVENOUS | Status: DC | PRN
  Administered 2014-05-11: 100 mg via INTRAVENOUS

## 2014-05-11 MED ORDER — BUPIVACAINE HCL (PF) 0.25 % IJ SOLN
INTRAMUSCULAR | Status: AC
  Filled 2014-05-11: qty 30

## 2014-05-11 MED ORDER — LIDOCAINE HCL (CARDIAC) 20 MG/ML IV SOLN
INTRAVENOUS | Status: DC | PRN
  Administered 2014-05-11: 60 mg via INTRAVENOUS

## 2014-05-11 MED ORDER — HEPARIN SODIUM (PORCINE) 1000 UNIT/ML IJ SOLN
INTRAMUSCULAR | Status: DC | PRN
  Administered 2014-05-11: 3000 [IU] via INTRAVENOUS

## 2014-05-11 MED ORDER — SODIUM CHLORIDE 0.9 % IV SOLN: 250.0000 mL | INTRAVENOUS | Status: DC | PRN

## 2014-05-11 MED ORDER — LEVALBUTEROL HCL 0.63 MG/3ML IN NEBU: 0.6300 mg | INHALATION_SOLUTION | Freq: Four times a day (QID) | RESPIRATORY_TRACT | Status: DC | PRN

## 2014-05-11 NOTE — Progress Notes (Signed)
Patient bringing up thick dark blood tinged phlegm.

## 2014-05-11 NOTE — Progress Notes (Signed)
Dr. Marcie Bal in to assess patient. Sore throat spray ordered.

## 2014-05-11 NOTE — Progress Notes (Signed)
Patient states rt side of face and jaw feels better. Throat remains sore. Using chloraseptic spay as needed. Rt groin hurting; site level 0.

## 2014-05-11 NOTE — Discharge Summary (Addendum)
ELECTROPHYSIOLOGY PROCEDURE DISCHARGE SUMMARY    Patient ID: Renee [name],  MRN: [id number], DOB/AGE: 10/22/1934 80 y.o.  Admit date: 05/11/2014 Discharge date: 05/11/2014  Primary Care Physician: [name], MD Primary Cardiologist: [place] Electrophysiologist: Thomas Hoff, MD  Primary Discharge Diagnosis:  Paroxysmal atrial fibrillation status post ablation this admission  Secondary Discharge Diagnosis:  1.  CAD s/p CABG 2.  Chronic diastolic heart failure 3.  COPD 4.  Hypothyroidism 5.  Hypertension 6.  RBBB 7.  IBS 8.  GERD  Procedures This Admission:  1.  Electrophysiology study and radiofrequency catheter ablation on 05/11/14 by Dr Thompson Grayer.  This study demonstrated sinus rhythm upon presentation; rotational Angiography reveals a moderate sized left atrium with four separate pulmonary veins without evidence of pulmonary vein stenosis; successful electrical isolation and anatomical encircling of all four pulmonary veins with radiofrequency current; no inducible arrhythmias following ablation both on and off of dobutamine; no early apparent complications.    Brief HPI: Renee [name] is a 80 y.o. female with a history of paroxysmal atrial fibrillation.  They have failed medical therapy with Tikosyn and amiodarone. Risks, benefits, and alternatives to catheter ablation of atrial fibrillation were reviewed with the patient who wished to proceed.  The patient underwent TEE prior to the procedure which demonstrated normal LV function and no LAA thrombus.    Hospital Course:  The patient was admitted and underwent EPS/RFCA of atrial fibrillation with details as outlined above.  They were monitored on telemetry overnight which demonstrated sinus rhythm.  Groin was without complication on the day of discharge.  The patient was examined and considered to be stable for discharge.  Wound care and restrictions were reviewed with the patient.  The patient will be seen back  by Roderic Palau, NP in 4 weeks and Dr Rayann Heman in 12 weeks for post ablation follow up.   Physical Exam: Filed Vitals:   05/11/14 1055 05/11/14 1110 05/11/14 1125 05/11/14 1140  BP: 137/55 155/73 148/69 165/67  Pulse: 75 79 79 81  Temp:      TempSrc:      Resp: 16 17 24 18   Height:      Weight:      SpO2: 99% 98% 98% 96%    GEN- The patient is well appearing, alert and oriented x 3 today.   HEENT: normocephalic, atraumatic; sclera clear, conjunctiva pink; hearing intact; oropharynx clear; neck supple, no JVP Lymph- no cervical lymphadenopathy Lungs- Clear to ausculation bilaterally, normal work of breathing.  No wheezes, rales, rhonchi Heart- Regular rate and rhythm, no murmurs, rubs or gallops, PMI not laterally displaced GI- soft, non-tender, non-distended, bowel sounds present, no hepatosplenomegaly Extremities- no clubbing, cyanosis, or edema; DP/PT/radial pulses 2+ bilaterally, groin without hematoma/bruit MS- no significant deformity or atrophy Skin- warm and dry, no rash or lesion Psych- euthymic mood, full affect Neuro- strength and sensation are intact   Labs:   Lab Results  Component Value Date   WBC 6.0 05/04/2014   HGB 13.2 05/04/2014   HCT 40.5 05/04/2014   MCV 82.8 05/04/2014   PLT 314.0 05/04/2014   No results for input(s): NA, K, CL, CO2, BUN, CREATININE, CALCIUM, PROT, BILITOT, ALKPHOS, ALT, AST, GLUCOSE in the last 168 hours.  Invalid input(s): LABALBU   Discharge Medications:    Medication List    ASK your doctor about these medications        Aclidinium Bromide 400 MCG/ACT Aepb  Commonly known as:  [place] for  two weeks and resume once new dentures made     ALPRAZolam 0.25 MG tablet  Commonly known as:  XANAX  Take 0.25 mg by mouth 2 (two) times daily as needed for anxiety or sleep.     apixaban 5 MG Tabs tablet  Commonly known as:  ELIQUIS  Take 1 tablet (5 mg total) by mouth 2 (two) times daily.     cholecalciferol 1000  UNITS tablet  Commonly known as:  VITAMIN D  Take 1,000 Units by mouth daily.     dofetilide 125 MCG capsule  Commonly known as:  TIKOSYN  Take 1 capsule (125 mcg total) by mouth 2 (two) times daily.     esomeprazole 40 MG capsule  Commonly known as:  NEXIUM  Take 40 mg by mouth daily before breakfast.     furosemide 20 MG tablet  Commonly known as:  LASIX  Take 40mg  daily     levalbuterol 45 MCG/ACT inhaler  Commonly known as:  XOPENEX HFA  Inhale 2 puffs into the lungs every 6 (six) hours as needed for wheezing.     levothyroxine 25 MCG tablet  Commonly known as:  SYNTHROID, LEVOTHROID  Take 25 mcg by mouth daily before breakfast.     metoprolol succinate 25 MG 24 hr tablet  Commonly known as:  TOPROL-XL  Take 1 tablet (25 mg total) by mouth 2 (two) times daily.     nitroGLYCERIN 0.4 MG SL tablet  Commonly known as:  NITROSTAT  Place 0.4 mg under the tongue every 5 (five) minutes as needed for chest pain.     Potassium Chloride ER 20 MEQ Tbcr  Take 40 mEq by mouth daily.     SYSTANE OP  Place 1 drop into both eyes daily.     traMADol 50 MG tablet  Commonly known as:  ULTRAM  Take 1 tablet (50 mg total) by mouth every 6 (six) hours as needed. Pain        Disposition:   Follow-up Information    Follow up with CARROLL,DONNA, NP On 06/15/2014.   Specialty:  Nurse Practitioner   Why:  at [employer] information:   [place] [address] [phone]       Follow up with Thompson Grayer, MD In 3 months.   Specialty:  Cardiology   Why:  office will call to schedule   Contact information:   [place] [address] [phone]       Duration of Discharge Encounter: Greater than 30 minutes including physician time.  Army Fossa MD  05/11/2014 12:12 PM    Pt with low grade fever and urinary frequency noted prior to discharge.  UA with C&S ordered to further evaluate for UTI as the cause. Results reviewed  and will be communicated to patient.  Thompson Grayer MD, [facility] 05/14/2014 9:17 PM

## 2014-05-11 NOTE — Progress Notes (Addendum)
Dr. Rayann Heman paged re ACT 190. OK to pull sheaths. Informed doctor that patient is "miserable". C/O rt groin feeling "raw". Rt groin level 0. Clearing throat of thick clear to blood tinged sputum-Dr. Allred and anesthesia aware.

## 2014-05-11 NOTE — Discharge Instructions (Signed)
No driving for 5 days. No lifting over 5 lbs for 1 week. No sexual activity for 1 week.  Keep procedure site clean & dry. If you notice increased pain, swelling, bleeding or pus, call/return!  You may shower, but no soaking baths/hot tubs/pools for 1 week.  ° ° °

## 2014-05-11 NOTE — Anesthesia Postprocedure Evaluation (Signed)
  Anesthesia Post-op Note  Patient: Renee [name]  Procedure(s) Performed: Procedure(s): ATRIAL FIBRILLATION ABLATION (N/A)  Patient Location: Cath Lab  Anesthesia Type:General  Level of Consciousness: awake, alert  and oriented  Airway and Oxygen Therapy: Patient Spontanous Breathing and Patient connected to face mask oxygen  Post-op Pain: none  Post-op Assessment: Post-op Vital signs reviewed, Patient's Cardiovascular Status Stable, Respiratory Function Stable, Patent Airway and No signs of Nausea or vomiting  Post-op Vital Signs: Reviewed and stable  Last Vitals:  Filed Vitals:   05/11/14 0551  BP: 134/56  Pulse: 61  Temp: 36.6 C  Resp: 18    Complications: No apparent anesthesia complications

## 2014-05-11 NOTE — Op Note (Signed)
SURGEON:  Thompson Grayer, MD  PREPROCEDURE DIAGNOSES: 1. Paroxysmal atrial fibrillation.  POSTPROCEDURE DIAGNOSES: 1. Paroxysmal  atrial fibrillation.  PROCEDURES: 1. Comprehensive electrophysiologic study. 2. Coronary sinus pacing and recording. 3. Three-dimensional mapping of atrial fibrillation  4. Ablation of atrial fibrillation  5. Intracardiac echocardiography. 6. Transseptal puncture of an intact septum. 7. Rotational Angiography with processing at an independent workstation 8. Arrhythmia induction with pacing with dobutamine infusion  INTRODUCTION:  Renee [name] is a 80 y.o. female with a history of paroxysmal atrial fibrillation who now presents for EP study and radiofrequency ablation.  The patient reports initially being diagnosed with atrial fibrillation after presenting with symptomatic palpitations and fatgiue. The patient reports increasing frequency and duration of atrial fibrillation since that time.  The patient has failed medical therapy withTIkosyn, and amiodarone.  The patient therefore presents today for catheter ablation of atrial fibrillation.  DESCRIPTION OF PROCEDURE:  Informed written consent was obtained, and the patient was brought to the electrophysiology lab in a fasting state.  The patient was adequately sedated with intravenous medications as outlined in the anesthesia report.  The patient's left and right groins were prepped and draped in the usual sterile fashion by the EP lab staff.  Using a percutaneous Seldinger technique, two 7-French and one 11-French hemostasis sheaths were placed into the right common femoral vein.  3 Dimensional Rotational Angiography: A 5 french pigtail catheter was introduced through the right common femoral vein and advanced into the inferior venocava.  3 demential rotational angiography was then performed by power injection of 100cc of nonionic contrast.  Reprocessing at an independent work station was then performed.   This  demonstrated a moderate sized left atrium with 4 separate pulmonary veins which were also small in size.  There were no anomalous veins or significant abnormalities.  A 3 dimensional rendering of the left atrium was then merged using [employer] onto the [employer] system and registered with intracardiac echo (see below).  The pigtail catheter was then removed.  Catheter Placement:  A 7-French [employer] Webster Decapolar coronary sinus catheter was introduced through the right common femoral vein and advanced into the coronary sinus for recording and pacing from this location.  A quadripolar catheter was introduced through the right common femoral vein and advanced into the right ventricle for recording and pacing.  This catheter was then pulled back to the His bundle location.    Initial Measurements: The patient presented to the electrophysiology lab in sinus rhythm.  Her PR interval measured 144 msec with a QRS duration of 131 msec and a QT interval of 475 msec.  The AH interval measured 49 msec and the HV interval measured 40 msec.     Intracardiac Echocardiography: A 10-French [employer] Webster AcuNav intracardiac echocardiography catheter was introduced through the left common femoral vein and advanced into the right atrium. Intracardiac echocardiography was performed of the left atrium, and a three-dimensional anatomical rendering of the left atrium was performed using CARTO sound technology.  The patient was noted to have a moderate sized left atrium.  The interatrial septum was prominent but not aneurysmal. All 4 pulmonary veins were visualized and noted to have separate ostia.  The pulmonary veins were small to moderate in size.  The left atrial appendage was visualized and did not reveal thrombus.   There was no evidence of pulmonary vein stenosis.   Transseptal Puncture: The middle right common femoral vein sheath was exchanged for an 8.5 French SL2 transseptal sheath and  transseptal access was achieved in a standard fashion using a [place] needle under biplane fluoroscopy with intracardiac echocardiography confirmation of the transseptal puncture.  Once transseptal access had been achieved, heparin was administered intravenously and intra- arterially in order to maintain an ACT of greater than 350 seconds throughout the procedure.     3D Mapping and Ablation: The His bundle catheter was removed and in its place a 3.5 mm [employer] Thermocool ablation catheter was advanced into the right atrium.  The transseptal sheath was pulled back into the IVC over a guidewire.  The ablation catheter was advanced across the transseptal hole using the wire as a guide.  The transseptal sheath was then re-advanced over the guidewire into the left atrium.  A duodecapolar Bios[employer] Webster circular mapping catheter was introduced through the transseptal sheath and positioned over the mouth of all 4 pulmonary veins.  Three-dimensional electroanatomical mapping was performed using CARTO technology.  This demonstrated electrical activity within all four pulmonary veins at baseline. The patient underwent successful sequential electrical isolation and anatomical encircling of all four pulmonary veins using radiofrequency current with a circular mapping catheter as a guide. The right inferior and superior PVs were isolated together using a WACA approach.   Measurements Following Ablation: Following ablation, Isuprel was dobutamine up to 20 mcg/kg/min with no inducible atrial fibrillation, atrial tachycardia, atrial flutter, or sustained PACs. In sinus rhythm with RR interval was 714 msec, with PR 136 msec, QRS 138 msec, and Qtc 472 msec.  Following ablation the AH interval measured 38 msec with an HV interval of 42 msec. Ventricular pacing was performed, which revealed midline decremental VA conduction with a VA Wenckebach cycle length of 370 msec.  Rapid atrial pacing was  performed, which revealed an AV Wenckebach cycle length of 320 msec.  Electroisolation was then again confirmed in all four pulmonary veins.  Pacing was performed along the ablation line which confirmed entrance and exit block.  The procedure was therefore considered completed.  All catheters were removed, and the sheaths were aspirated and flushed.  The patient was transferred to the recovery area for sheath removal per protocol. EBL<62ml.  A limited bedside transthoracic echocardiogram revealed no pericardial effusion.  There were no early apparent complications.  CONCLUSIONS: 1. Sinus rhythm upon presentation.   2. Rotational Angiography reveals a moderate sized left atrium with four separate pulmonary veins without evidence of pulmonary vein stenosis. 3. Successful electrical isolation and anatomical encircling of all four pulmonary veins with radiofrequency current. 4. No inducible arrhythmias following ablation both on and off of dobutamine  5. No early apparent complications.   Jeneen Rinks Nancylee Gaines,MD 10:24 AM 05/11/2014

## 2014-05-11 NOTE — Anesthesia Preprocedure Evaluation (Addendum)
Anesthesia Evaluation  Patient identified by MRN, date of birth, ID band Patient awake    Reviewed: Allergy & Precautions, NPO status , Patient's Chart, lab work & pertinent test results  Airway Mallampati: II   Neck ROM: full    Dental   Pulmonary COPDformer smoker,          Cardiovascular hypertension, + CAD, + Peripheral Vascular Disease and +CHF + dysrhythmias Atrial Fibrillation     Neuro/Psych Anxiety  Neuromuscular disease    GI/Hepatic GERD-  ,  Endo/Other  Hypothyroidism   Renal/GU      Musculoskeletal   Abdominal   Peds  Hematology   Anesthesia Other Findings   Reproductive/Obstetrics                            Anesthesia Physical Anesthesia Plan  ASA: III  Anesthesia Plan: MAC   Post-op Pain Management:    Induction: Intravenous  Airway Management Planned: Simple Face Mask  Additional Equipment:   Intra-op Plan:   Post-operative Plan:   Informed Consent: I have reviewed the patients History and Physical, chart, labs and discussed the procedure including the risks, benefits and alternatives for the proposed anesthesia with the patient or authorized representative who has indicated his/her understanding and acceptance.     Plan Discussed with: CRNA, Anesthesiologist and Surgeon  Anesthesia Plan Comments:         Anesthesia Quick Evaluation

## 2014-05-11 NOTE — Progress Notes (Signed)
Dr. Rayann Heman in room to check on patient.

## 2014-05-11 NOTE — Progress Notes (Signed)
Utilization Review Completed.Donne Anon T4/06/2014

## 2014-05-11 NOTE — Anesthesia Procedure Notes (Signed)
Procedure Name: LMA Insertion Date/Time: 05/11/2014 7:53 AM Performed by: Clearnce Sorrel Pre-anesthesia Checklist: Patient identified, Timeout performed, Emergency Drugs available, Suction available and Patient being monitored Patient Re-evaluated:Patient Re-evaluated prior to inductionOxygen Delivery Method: Circle system utilized Preoxygenation: Pre-oxygenation with 100% oxygen Intubation Type: IV induction LMA: LMA inserted LMA Size: 4.0 Number of attempts: 1 Placement Confirmation: positive ETCO2 and breath sounds checked- equal and bilateral Tube secured with: Tape Dental Injury: Teeth and Oropharynx as per pre-operative assessment

## 2014-05-11 NOTE — Progress Notes (Addendum)
Patient c/o rt side of face and jaw hurting and sore throat. No swelling nor redness.  Anesthesia called and asked to come look at patient.

## 2014-05-11 NOTE — H&P (Addendum)
Expand All Collapse All       ID: Renee, [name] 1934/10/06, MRN [id number]  PCP: [name], MD Cardiologist: Dr Martinique Primary Electrophysiologist: Dr Caryl Comes  Chief Complaint  Patient presents with  . Appointment    PAF    History of Present Illness: Renee [name] is a 80 y.o. female who presents today for EPS and RFA of her afib. She has had atrial fibrillation for several years. This was initially diagnosed after CABG in 2011. She was placed on amiodarone but did not tolerate this medicine due to worsening SOB. Due to afib with RVR, she has been treated with both cardizem and metoprolol. She has not tolerated diltiazem very well. She was evaluated by Dr Caryl Comes and placed on tikosyn 10/15. She does not feel that she has tolerated tikosyn. She reports symptoms of throat discomfort as well as fatigue. She has ongoing palpitations which appear to be primarily pacs and pvcs. She is not very active. She has chronic lung disease with poor exercise effort.  Today, she denies symptoms of chest pain, orthopnea, PND, lower extremity edema, claudication, dizziness, presyncope, syncope, bleeding, or neurologic sequela. The patient is tolerating medications without difficulties and is otherwise without complaint today.    Past Medical History  Diagnosis Date  . GERD (gastroesophageal reflux disease)   . IBS (irritable bowel syndrome)   . RBBB   . Coronary artery disease     a. CABG 09/2009. b. normal myoview in 05/2012.   [name] Anxiety   . PAF (paroxysmal atrial fibrillation)     a. Post op after CABG. Intolerant of amio in the past. b. Recurrence - started on Tikosyn 11/2013.  [name] Chronic diastolic CHF (congestive heart failure)   . Dysphagia   . Colon polyps   . Hypertension   . HLD (hyperlipidemia)   . Hypothyroidism   . Chronic bronchitis   . Emphysema/COPD     on home O2  .  Sciatic nerve pain   . Orthostatic hypotension   . PVC's (premature ventricular contractions)     a. Holter 11/2013: no AF, no SVT, rare PVC couplet, occasional PACs.  . Premature atrial contractions     a. Holter 11/2013: no AF, no SVT, rare PVC couplet, occasional PACs.  . Chronic respiratory failure   . Bradycardia    Past Surgical History  Procedure Laterality Date  . Total hip arthroplasty Right 11/01/2008  . Appendectomy  1995  . Tonsillectomy and adenoidectomy    . Joint replacement    . Coronary artery bypass graft  09/21/2009    CABG X2  . Cardiac catheterization  1990's; 06/2009  . Cholecystectomy  2008  . Larynx surgery  ~ 1960    "tumor removed"  . Total abdominal hysterectomy  1986  . Tubal ligation  1973  . Cataract extraction w/ intraocular lens implant, bilateral Bilateral   . Eye surgery    . Retinal laser procedure Left 10/19/2013    "had a wrinkle in it"     Current Outpatient Prescriptions  Medication Sig Dispense Refill  . Aclidinium Bromide (TUDORZA PRESSAIR) 400 MCG/ACT AEPB Inhale 1 puff into the lungs 2 (two) times daily. 3 each 2  . ALPRAZolam (XANAX) 0.25 MG tablet Take 0.25 mg by mouth 2 (two) times daily as needed for anxiety or sleep.    [name] apixaban (ELIQUIS) 5 MG TABS tablet Take 1 tablet (5 mg total) by mouth 2 (two) times daily. 28 tablet 0  . cholecalciferol (  VITAMIN D) 1000 UNITS tablet Take 1,000 Units by mouth daily.    . clindamycin (CLEOCIN) 300 MG capsule Take 1 capsule by mouth 3 (three) times daily.  0  . dofetilide (TIKOSYN) 125 MCG capsule Take 1 capsule (125 mcg total) by mouth 2 (two) times daily. 60 capsule 6  . esomeprazole (NEXIUM) 40 MG capsule Take 40 mg by mouth daily before breakfast.     . furosemide (LASIX) 20 MG tablet Take 40mg  daily (Patient taking differently: Take 40 mg by mouth daily. Take  40mg  daily) 60 tablet 11  . levalbuterol (XOPENEX HFA) 45 MCG/ACT inhaler Inhale 2 puffs into the lungs every 6 (six) hours as needed for wheezing.    [name] levothyroxine (SYNTHROID, LEVOTHROID) 25 MCG tablet Take 25 mcg by mouth daily before breakfast.     . metoprolol succinate (TOPROL-XL) 25 MG 24 hr tablet Take 1 tablet (25 mg total) by mouth 2 (two) times daily. 30 tablet 6  . nitroGLYCERIN (NITROSTAT) 0.4 MG SL tablet Place 0.4 mg under the tongue every 5 (five) minutes as needed for chest pain.    [name] Glycol-Propyl Glycol (SYSTANE OP) Place 1 drop into both eyes daily.     . Potassium Chloride ER 20 MEQ TBCR Take 40 mEq by mouth daily. 90 tablet 3  . traMADol (ULTRAM) 50 MG tablet Take 1 tablet (50 mg total) by mouth every 6 (six) hours as needed. Pain 30 tablet 0  . [DISCONTINUED] budesonide-formoterol (SYMBICORT) 160-4.5 MCG/ACT inhaler Inhale 2 puffs into the lungs 2 (two) times daily. 1 Inhaler 12  . [DISCONTINUED] calcium citrate-vitamin D (CITRACAL+D) 315-200 MG-UNIT per tablet Take 1 tablet by mouth 2 (two) times daily.      No current facility-administered medications for this visit.    Allergies: Spiriva; Statins; Levofloxacin; Budesonide-formoterol fumarate; Morphine; Penicillins; Phenazopyridine hcl; Pneumococcal vaccine; Pneumococcal vaccines; Prednisone; Prednisone; Sulfonamide derivatives; Tylenol; Penicillins; and Sulfa antibiotics   Social History: The patient  reports that she quit smoking about 4 years ago. Her smoking use included Cigarettes. She has a 50 pack-year smoking history. She has never used smokeless tobacco. She reports that she does not drink alcohol or use illicit drugs.   Family History: The patient's family history includes Breast cancer in her sister; Cancer in her sister; Colon cancer in her daughter; Colon polyps in her brother; Deep vein thrombosis in her mother; Diabetes in her father; Heart  attack in her father; Heart disease in her father and mother; Hyperlipidemia in her father; Hypertension in her brother, father, and mother; Other in her sister; Transient ischemic attack in her mother.    ROS: Please see the history of present illness. All other systems are reviewed and negative.    PHYSICAL EXAM: Filed Vitals:   05/11/14 0551  BP: 134/56  Pulse: 61  Temp: 97.9 F (36.6 C)  Resp: 18   . GEN: Well nourished, well developed, in no acute distress  HEENT: normal  Neck: no JVD, carotid bruits, or masses Cardiac: RRR; no murmurs, rubs, or gallops,no edema  Respiratory: clear to auscultation bilaterally, normal work of breathing GI: soft, nontender, nondistended, + BS MS: no deformity or atrophy  Skin: warm and dry  Neuro: Strength and sensation are intact Psych: anxious   Lipid Panel   Labs (Brief)       Component Value Date/Time   CHOL * 07/16/2007 0030    207  ATP III CLASSIFICATION: <200 mg/dL Desirable 200-239 mg/dL Borderline High >=240 mg/dL High   TRIG  116 07/16/2007 0030   HDL 45 07/16/2007 0030   CHOLHDL 4.6 07/16/2007 0030   VLDL 23 07/16/2007 0030   LDLCALC * 07/16/2007 0030    139  Total Cholesterol/HDL:CHD Risk Coronary Heart Disease Risk Table  Men Women 1/2 Average Risk 3.4 3.3       Wt Readings from Last 3 Encounters:  04/05/14 156 lb (70.761 kg)  03/15/14 155 lb 12.8 oz (70.67 kg)  02/01/14 155 lb (70.308 kg)      Other studies Reviewed: Additional studies/ records that were reviewed today include: Dr Klein/Jordan/Wrights notes  Echo 11/19/13 reveals preserved EF with normal LA size and no significant valvular disease   ASSESSMENT AND PLAN:  1. paroxysmal atrial fibrillation The patient has symptomatic recurrent atrial fibrillation. She has failed medical therapy with amiodarone and tikosyn (due to  symptoms).Therapeutic strategies for afib including medicine and ablation were discussed in detail with the patient today. Risk, benefits, and alternatives to EP study and radiofrequency ablation for afib were also discussed in detail today. These risks include but are not limited to stroke, bleeding, vascular damage, tamponade, perforation, damage to the esophagus, lungs, and other structures, pulmonary vein stenosis, worsening renal function, and death. She understands that her pulmonary issues increase her risks with anesthesia. The patient understands these risk and wishes to proceed.   chads2vasc score is at least 5 :)

## 2014-05-11 NOTE — Transfer of Care (Signed)
Immediate Anesthesia Transfer of Care Note  Patient: Renee [name]  Procedure(s) Performed: Procedure(s): ATRIAL FIBRILLATION ABLATION (N/A)  Patient Location: PACU  Anesthesia Type:General  Level of Consciousness: awake, alert  and oriented  Airway & Oxygen Therapy: Patient Spontanous Breathing and Patient connected to face mask oxygen  Post-op Assessment: Report given to RN and Post -op Vital signs reviewed and stable  Post vital signs: Reviewed and stable  Last Vitals:  Filed Vitals:   05/11/14 0551  BP: 134/56  Pulse: 61  Temp: 36.6 C  Resp: 18    Complications: No apparent anesthesia complications

## 2014-05-11 NOTE — Progress Notes (Addendum)
Site area: Rt fem vein x3 Site Prior to Removal:  Level 0 Pressure Applied For: 25 Manual:   yes Patient Status During Pull:  A/o Post Pull Site:  Level 0 Post Pull Instructions Given:  Yes, pt understands Post Pull Pulses Present: 2+ Dressing Applied:  tegaderm and 4x4 Bedrest begins @ 12:20:00Comments: Rt groin site is unremarkable. Dressing is CDI.

## 2014-05-12 DIAGNOSIS — I480 Paroxysmal atrial fibrillation: Secondary | ICD-10-CM

## 2014-05-12 DIAGNOSIS — Z951 Presence of aortocoronary bypass graft: Secondary | ICD-10-CM | POA: Diagnosis not present

## 2014-05-12 DIAGNOSIS — I2510 Atherosclerotic heart disease of native coronary artery without angina pectoris: Secondary | ICD-10-CM | POA: Diagnosis not present

## 2014-05-12 DIAGNOSIS — K219 Gastro-esophageal reflux disease without esophagitis: Secondary | ICD-10-CM | POA: Diagnosis not present

## 2014-05-12 LAB — URINALYSIS, ROUTINE W REFLEX MICROSCOPIC
BILIRUBIN URINE: NEGATIVE
Glucose, UA: NEGATIVE mg/dL
HGB URINE DIPSTICK: NEGATIVE
Ketones, ur: NEGATIVE mg/dL
Leukocytes, UA: NEGATIVE
Nitrite: NEGATIVE
PROTEIN: NEGATIVE mg/dL
Specific Gravity, Urine: 1.034 — ABNORMAL HIGH (ref 1.005–1.030)
UROBILINOGEN UA: 0.2 mg/dL (ref 0.0–1.0)
pH: 5 (ref 5.0–8.0)

## 2014-05-12 LAB — MAGNESIUM: Magnesium: 1.8 mg/dL (ref 1.5–2.5)

## 2014-05-12 LAB — BASIC METABOLIC PANEL
Anion gap: 7 (ref 5–15)
BUN: 6 mg/dL (ref 6–23)
CO2: 26 mmol/L (ref 19–32)
Calcium: 8.4 mg/dL (ref 8.4–10.5)
Chloride: 101 mmol/L (ref 96–112)
Creatinine, Ser: 0.97 mg/dL (ref 0.50–1.10)
GFR calc Af Amer: 64 mL/min — ABNORMAL LOW (ref >90)
GFR, EST NON AFRICAN AMERICAN: 55 mL/min — AB (ref >90)
Glucose, Bld: 105 mg/dL — ABNORMAL HIGH (ref 70–99)
POTASSIUM: 3.9 mmol/L (ref 3.5–5.1)
Sodium: 134 mmol/L — ABNORMAL LOW (ref 135–145)

## 2014-05-12 MED ORDER — MAGNESIUM OXIDE 400 (241.3 MG) MG PO TABS
400.0000 mg | ORAL_TABLET | Freq: Once | ORAL | Status: AC
  Administered 2014-05-12: 400 mg via ORAL
  Filled 2014-05-12: qty 1

## 2014-05-12 MED ORDER — MAGNESIUM SULFATE 2 GM/50ML IV SOLN: 2.0000 g | Freq: Once | INTRAVENOUS | Status: DC

## 2014-05-12 MED ORDER — POTASSIUM CHLORIDE CRYS ER 20 MEQ PO TBCR
40.0000 meq | EXTENDED_RELEASE_TABLET | Freq: Once | ORAL | Status: AC
  Administered 2014-05-12: 40 meq via ORAL
  Filled 2014-05-12: qty 2

## 2014-05-12 NOTE — Progress Notes (Signed)
Pt. Claimed that she has a feeling in going to the bathroom every now and then. With low grade fever from last night. MD aware with order to do UA and urine culture before discharge home.

## 2014-05-12 NOTE — Progress Notes (Signed)
Discharged home accompanied by husband, discharge instructions given, all belongings taken home.

## 2014-05-13 LAB — URINE CULTURE: Special Requests: NORMAL

## 2014-05-14 ENCOUNTER — Other Ambulatory Visit: Payer: Self-pay | Admitting: Cardiology

## 2014-05-19 ENCOUNTER — Encounter: Payer: Self-pay | Admitting: Pulmonary Disease

## 2014-05-19 ENCOUNTER — Ambulatory Visit (INDEPENDENT_AMBULATORY_CARE_PROVIDER_SITE_OTHER)
Admission: RE | Admit: 2014-05-19 | Discharge: 2014-05-19 | Disposition: A | Discharge status: Home / Self Care | Payer: Medicare Other | Source: Ambulatory Visit | Attending: Pulmonary Disease | Admitting: Pulmonary Disease

## 2014-05-19 ENCOUNTER — Ambulatory Visit (INDEPENDENT_AMBULATORY_CARE_PROVIDER_SITE_OTHER): Payer: Medicare Other | Admitting: Pulmonary Disease

## 2014-05-19 VITALS — BP 104/70 | HR 80 | Temp 97.7°F

## 2014-05-19 DIAGNOSIS — J418 Mixed simple and mucopurulent chronic bronchitis: Secondary | ICD-10-CM

## 2014-05-19 DIAGNOSIS — I5032 Chronic diastolic (congestive) heart failure: Secondary | ICD-10-CM | POA: Diagnosis not present

## 2014-05-19 DIAGNOSIS — I2510 Atherosclerotic heart disease of native coronary artery without angina pectoris: Secondary | ICD-10-CM | POA: Diagnosis not present

## 2014-05-19 DIAGNOSIS — R0602 Shortness of breath: Secondary | ICD-10-CM | POA: Diagnosis not present

## 2014-05-19 DIAGNOSIS — R05 Cough: Secondary | ICD-10-CM

## 2014-05-19 DIAGNOSIS — R059 Cough, unspecified: Secondary | ICD-10-CM | POA: Insufficient documentation

## 2014-05-19 DIAGNOSIS — I480 Paroxysmal atrial fibrillation: Secondary | ICD-10-CM

## 2014-05-19 MED ORDER — METHYLPREDNISOLONE ACETATE 80 MG/ML IJ SUSP
80.0000 mg | Freq: Once | INTRAMUSCULAR | Status: AC
  Administered 2014-05-19: 80 mg via INTRAMUSCULAR

## 2014-05-19 MED ORDER — HYDROCOD POLST-CHLORPHEN POLST 10-8 MG/5ML PO LQCR: 5.0000 mL | Freq: Two times a day (BID) | ORAL | Status: DC | PRN

## 2014-05-19 MED ORDER — DOXYCYCLINE HYCLATE 100 MG PO TABS: 100.0000 mg | ORAL_TABLET | Freq: Two times a day (BID) | ORAL | Status: DC

## 2014-05-19 NOTE — Patient Instructions (Signed)
Today we updated your med list in our EPIC system...    Continue your current medications the same...  Today we did a follow up CXR>    We will contact you w/ the results when available...   We wrote for an antibiotic- DOXYCYCLINE 100mg  take one tab twice daily til gone...    Consider taking a probiotic as well (eg- ALIGN) or ACTIVIA yogurt while you are on the antibiotic...  We also wrote for a good cough syrup> TUSSIONEX take one tsp every 12h as needed for cough...  Call for any questions.Marland KitchenMarland Kitchen

## 2014-05-24 ENCOUNTER — Telehealth: Payer: Self-pay | Admitting: Pulmonary Disease

## 2014-05-24 ENCOUNTER — Encounter (HOSPITAL_COMMUNITY): Payer: Self-pay | Admitting: Emergency Medicine

## 2014-05-24 ENCOUNTER — Ambulatory Visit: Payer: Medicare Other | Admitting: Internal Medicine

## 2014-05-24 ENCOUNTER — Emergency Department (HOSPITAL_COMMUNITY)
Admission: EM | Admit: 2014-05-24 | Discharge: 2014-05-24 | Disposition: A | Discharge status: Home / Self Care | Payer: Medicare Other | Attending: Emergency Medicine | Admitting: Emergency Medicine

## 2014-05-24 ENCOUNTER — Emergency Department (HOSPITAL_COMMUNITY): Payer: Medicare Other

## 2014-05-24 DIAGNOSIS — E039 Hypothyroidism, unspecified: Secondary | ICD-10-CM | POA: Insufficient documentation

## 2014-05-24 DIAGNOSIS — Z9889 Other specified postprocedural states: Secondary | ICD-10-CM | POA: Insufficient documentation

## 2014-05-24 DIAGNOSIS — K589 Irritable bowel syndrome without diarrhea: Secondary | ICD-10-CM | POA: Diagnosis not present

## 2014-05-24 DIAGNOSIS — Z79899 Other long term (current) drug therapy: Secondary | ICD-10-CM | POA: Insufficient documentation

## 2014-05-24 DIAGNOSIS — J449 Chronic obstructive pulmonary disease, unspecified: Secondary | ICD-10-CM | POA: Diagnosis not present

## 2014-05-24 DIAGNOSIS — Z8739 Personal history of other diseases of the musculoskeletal system and connective tissue: Secondary | ICD-10-CM | POA: Insufficient documentation

## 2014-05-24 DIAGNOSIS — R404 Transient alteration of awareness: Secondary | ICD-10-CM | POA: Diagnosis not present

## 2014-05-24 DIAGNOSIS — I5032 Chronic diastolic (congestive) heart failure: Secondary | ICD-10-CM | POA: Insufficient documentation

## 2014-05-24 DIAGNOSIS — Z880 Allergy status to penicillin: Secondary | ICD-10-CM | POA: Diagnosis not present

## 2014-05-24 DIAGNOSIS — I10 Essential (primary) hypertension: Secondary | ICD-10-CM | POA: Insufficient documentation

## 2014-05-24 DIAGNOSIS — Z86018 Personal history of other benign neoplasm: Secondary | ICD-10-CM | POA: Insufficient documentation

## 2014-05-24 DIAGNOSIS — F419 Anxiety disorder, unspecified: Secondary | ICD-10-CM | POA: Insufficient documentation

## 2014-05-24 DIAGNOSIS — R0602 Shortness of breath: Secondary | ICD-10-CM | POA: Diagnosis not present

## 2014-05-24 DIAGNOSIS — K209 Esophagitis, unspecified without bleeding: Secondary | ICD-10-CM

## 2014-05-24 DIAGNOSIS — Z7902 Long term (current) use of antithrombotics/antiplatelets: Secondary | ICD-10-CM | POA: Diagnosis not present

## 2014-05-24 DIAGNOSIS — R531 Weakness: Secondary | ICD-10-CM | POA: Diagnosis not present

## 2014-05-24 DIAGNOSIS — I2510 Atherosclerotic heart disease of native coronary artery without angina pectoris: Secondary | ICD-10-CM | POA: Insufficient documentation

## 2014-05-24 DIAGNOSIS — Z87891 Personal history of nicotine dependence: Secondary | ICD-10-CM | POA: Diagnosis not present

## 2014-05-24 DIAGNOSIS — Z951 Presence of aortocoronary bypass graft: Secondary | ICD-10-CM | POA: Insufficient documentation

## 2014-05-24 LAB — BASIC METABOLIC PANEL
ANION GAP: 10 (ref 5–15)
BUN: 28 mg/dL — AB (ref 6–23)
CALCIUM: 8.6 mg/dL (ref 8.4–10.5)
CHLORIDE: 103 mmol/L (ref 96–112)
CO2: 28 mmol/L (ref 19–32)
CREATININE: 1.2 mg/dL — AB (ref 0.50–1.10)
GFR calc Af Amer: 49 mL/min — ABNORMAL LOW (ref >90)
GFR calc non Af Amer: 42 mL/min — ABNORMAL LOW (ref >90)
GLUCOSE: 120 mg/dL — AB (ref 70–99)
Potassium: 3.5 mmol/L (ref 3.5–5.1)
Sodium: 141 mmol/L (ref 135–145)

## 2014-05-24 LAB — CBC
HEMATOCRIT: 42.5 % (ref 36.0–46.0)
Hemoglobin: 13.1 g/dL (ref 12.0–15.0)
MCH: 27.2 pg (ref 26.0–34.0)
MCHC: 30.8 g/dL (ref 30.0–36.0)
MCV: 88.2 fL (ref 78.0–100.0)
Platelets: 426 10*3/uL — ABNORMAL HIGH (ref 150–400)
RBC: 4.82 MIL/uL (ref 3.87–5.11)
RDW: 15.1 % (ref 11.5–15.5)
WBC: 8.1 10*3/uL (ref 4.0–10.5)

## 2014-05-24 LAB — CBG MONITORING, ED: GLUCOSE-CAPILLARY: 110 mg/dL — AB (ref 70–99)

## 2014-05-24 MED ORDER — FLUCONAZOLE 40 MG/ML PO SUSR: 200.0000 mg | Freq: Three times a day (TID) | ORAL | Status: DC

## 2014-05-24 MED ORDER — SODIUM CHLORIDE 0.9 % IV BOLUS (SEPSIS)
1000.0000 mL | Freq: Once | INTRAVENOUS | Status: AC
  Administered 2014-05-24: 1000 mL via INTRAVENOUS

## 2014-05-24 MED ORDER — FLUCONAZOLE 40 MG/ML PO SUSR
200.0000 mg | Freq: Once | ORAL | Status: AC
  Administered 2014-05-24: 200 mg via ORAL
  Filled 2014-05-24: qty 5

## 2014-05-24 NOTE — Telephone Encounter (Signed)
Spoke with pt, c/o prod cough with thick green mucus, weakness, difficulty eating, dehydrated- has lost 8 lbs, chest tightness.  Denies fever, sinus congestion.   Pt uses [facility] on [address].   Pt states she will go to the hospital if she does not hear back from [place] in 1 hour.    Dr. Joya Gaskins please advise.  Thanks!

## 2014-05-24 NOTE — ED Notes (Signed)
Bed: WA17 Expected date:  Expected time:  Means of arrival:  Comments: EMS- weakness/loss of appetite

## 2014-05-24 NOTE — Telephone Encounter (Signed)
Spoke with pt, she is heading to the ED.  Nothing further needed.

## 2014-05-24 NOTE — Discharge Instructions (Signed)
Past discussed, it is important that he follow up with your gastroenterologist for appropriate ongoing care.  Return here for concerning changes in her condition.   Esophagitis Esophagitis is inflammation of the esophagus. It can involve swelling, soreness, and pain in the esophagus. This condition can make it difficult and painful to swallow. CAUSES  Most causes of esophagitis are not serious. Many different factors can cause esophagitis, including:  Gastroesophageal reflux disease (GERD). This is when acid from your stomach flows up into the esophagus.  Recurrent vomiting.  An allergic-type reaction.  Certain medicines, especially those that come in large pills.  Ingestion of harmful chemicals, such as household cleaning products.  Heavy alcohol use.  An infection of the esophagus.  Radiation treatment for cancer.  Certain diseases such as sarcoidosis, Crohn's disease, and scleroderma. These diseases may cause recurrent esophagitis. SYMPTOMS   Trouble swallowing.  Painful swallowing.  Chest pain.  Difficulty breathing.  Nausea.  Vomiting.  Abdominal pain. DIAGNOSIS  Your caregiver will take your history and do a physical exam. Depending upon what your caregiver finds, certain tests may also be done, including:  Barium X-ray. You will drink a solution that coats the esophagus, and X-rays will be taken.  Endoscopy. A lighted tube is put down the esophagus so your caregiver can examine the area.  Allergy tests. These can sometimes be arranged through follow-up visits. TREATMENT  Treatment will depend on the cause of your esophagitis. In some cases, steroids or other medicines may be given to help relieve your symptoms or to treat the underlying cause of your condition. Medicines that may be recommended include:  Viscous lidocaine, to soothe the esophagus.  Antacids.  Acid reducers.  Proton pump inhibitors.  Antiviral medicines for certain viral infections  of the esophagus.  Antifungal medicines for certain fungal infections of the esophagus.  Antibiotic medicines, depending on the cause of the esophagitis. HOME CARE INSTRUCTIONS   Avoid foods and drinks that seem to make your symptoms worse.  Eat small, frequent meals instead of large meals.  Avoid eating for the 3 hours prior to your bedtime.  If you have trouble taking pills, use a pill splitter to decrease the size and likelihood of the pill getting stuck or injuring the esophagus on the way down. Drinking water after taking a pill also helps.  Stop smoking if you smoke.  Maintain a healthy weight.  Wear loose-fitting clothing. Do not wear anything tight around your waist that causes pressure on your stomach.  Raise the head of your bed 6 to 8 inches with wood blocks to help you sleep. Extra pillows will not help.  Only take over-the-counter or prescription medicines as directed by your caregiver. SEEK IMMEDIATE MEDICAL CARE IF:  You have severe chest pain that radiates into your arm, neck, or jaw.  You feel sweaty, dizzy, or lightheaded.  You have shortness of breath.  You vomit blood.  You have difficulty or pain with swallowing.  You have bloody or black, tarry stools.  You have a fever.  You have a burning sensation in the chest more than 3 times a week for more than 2 weeks.  You cannot swallow, drink, or eat.  You drool because you cannot swallow your saliva. MAKE SURE YOU:  Understand these instructions.  Will watch your condition.  Will get help right away if you are not doing well or get worse. Document Released: 03/01/2004 Document Revised: 04/16/2011 Document Reviewed: 09/22/2010 [facility] Patient Information 2015 [place], [place]. This information  is not intended to replace advice given to you by your health care provider. Make sure you discuss any questions you have with your health care provider.

## 2014-05-24 NOTE — Telephone Encounter (Signed)
Noted,  i guess ED is best course but we did offer the appt

## 2014-05-24 NOTE — ED Notes (Signed)
Recently intubated after an ablasion, intubation caused sore in mouth leading to discomfort while eating. Since then patient has become weak, dehydrated, and has no appetite. EMS called out for SOB, on arrival pt had no labored respirations, O2 sat 97% on room air. Lives at home with husband. In route receive 750 ml of NS from EMS IV.

## 2014-05-24 NOTE — Telephone Encounter (Signed)
Needs to be seen today

## 2014-05-24 NOTE — ED Provider Notes (Signed)
CSN: [id number]     Arrival date & time 05/24/14  1211 History   First MD Initiated Contact with Patient 05/24/14 1226     Chief Complaint  Patient presents with  . Weakness  . Cough     (Consider location/radiation/quality/duration/timing/severity/associated sxs/prior Treatment) HPI Patient presents with concern of difficulty swallowing, fatigue. Symptoms began over the past days, have progressed. Patient has a notable recent history of transesophageal echocardiogram, and ablation for atrial fibrillation. She states that since these procedures she has developed her symptoms. The procedures themselves were well tolerated. She is tolerant of her own saliva, has not had any vomiting, but states that every time she swallows, solids and liquids she feels as though there is a, fullness, difficulty with food passage. Patient's daughter is here, cysts with history of present illness. He states that over the past years patient has had similar episodes, though not as pronounced. Patient has seen ENT, GI, had barium swallow study, without clear diagnosis.  Past Medical History  Diagnosis Date  . GERD (gastroesophageal reflux disease)   . IBS (irritable bowel syndrome)   . RBBB   . Coronary artery disease     a. CABG 09/2009. b. normal myoview in 05/2012.   [name] Anxiety   . PAF (paroxysmal atrial fibrillation)     a. Post op after CABG. Intolerant of amio in the past. b. Recurrence - started on Tikosyn 11/2013.  [name] Chronic diastolic CHF (congestive heart failure)   . Dysphagia   . Colon polyps   . Hypertension   . HLD (hyperlipidemia)   . Hypothyroidism   . Chronic bronchitis   . Emphysema/COPD     on home O2  . Sciatic nerve pain   . Orthostatic hypotension   . PVC's (premature ventricular contractions)     a. Holter 11/2013: no AF, no SVT, rare PVC couplet, occasional PACs.  . Premature atrial contractions     a. Holter 11/2013: no AF, no SVT, rare PVC couplet, occasional PACs.  .  Chronic respiratory failure   . Bradycardia   . Brain aneurysm    Past Surgical History  Procedure Laterality Date  . Total hip arthroplasty Right 11/01/2008  . Appendectomy  1995  . Tonsillectomy and adenoidectomy    . Joint replacement    . Coronary artery bypass graft  09/21/2009    CABG X2  . Cardiac catheterization  1990's; 06/2009  . Cholecystectomy  2008  . Larynx surgery  ~ 1960    "tumor removed"  . Total abdominal hysterectomy  1986  . Tubal ligation  1973  . Cataract extraction w/ intraocular lens  implant, bilateral Bilateral   . Eye surgery    . Retinal laser procedure Left 10/19/2013    "had a wrinkle in it"  . Tee without cardioversion N/A 05/10/2014    Procedure: TRANSESOPHAGEAL ECHOCARDIOGRAM (TEE);  Surgeon: Sueanne Margarita, MD;  Location: [facility] ENDOSCOPY;  Service: Cardiovascular;  Laterality: N/A;  . Atrial fibrillation ablation N/A 05/11/2014    Procedure: ATRIAL FIBRILLATION ABLATION;  Surgeon: Thompson Grayer, MD;  Location: [facility] CATH LAB;  Service: Cardiovascular;  Laterality: N/A;   Family History  Problem Relation Age of Onset  . Heart disease Mother   . Transient ischemic attack Mother   . Hypertension Mother   . Deep vein thrombosis Mother   . Heart attack Father   . Hypertension Father   . Diabetes Father   . Heart disease Father     Heart disease before age  46  . Hyperlipidemia Father   . Breast cancer Sister   . Cancer Sister   . Other Sister     brain tumor  . Colon polyps Brother   . Hypertension Brother   . Colon cancer Daughter    History  Substance Use Topics  . Smoking status: Former Smoker -- 1.00 packs/day for 50 years    Types: Cigarettes    Quit date: 09/21/2009  . Smokeless tobacco: Never Used  . Alcohol Use: No   OB History    No data available     Review of Systems  Constitutional:       Per HPI, otherwise negative  HENT:       Per HPI, otherwise negative  Respiratory:       Per HPI, otherwise negative  Cardiovascular:        Per HPI, otherwise negative  Gastrointestinal: Negative for vomiting.  Endocrine:       Negative aside from HPI  Genitourinary:       Neg aside from HPI   Musculoskeletal:       Per HPI, otherwise negative  Skin: Negative.   Neurological: Negative for syncope.      Allergies  Spiriva; Statins; Levofloxacin; Budesonide-formoterol fumarate; Morphine; Penicillins; Phenazopyridine hcl; Pneumococcal vaccines; Prednisone; Sulfonamide derivatives; Tylenol; Penicillins; and Sulfa antibiotics  Home Medications   Prior to Admission medications   Medication Sig Start Date End Date Taking? Authorizing Provider  ALPRAZolam (XANAX) 0.25 MG tablet Take 0.25 mg by mouth 2 (two) times daily as needed for anxiety or sleep.   Yes Historical Provider, MD  apixaban (ELIQUIS) 5 MG TABS tablet Take 1 tablet (5 mg total) by mouth 2 (two) times daily. 02/12/14  Yes Peter M Martinique, MD  chlorpheniramine-HYDROcodone [facility] PENNKINETIC ER) 10-8 MG/5ML LQCR Take 5 mLs by mouth every 12 (twelve) hours as needed for cough. 05/19/14  Yes Noralee Space, MD  cholecalciferol (VITAMIN D) 1000 UNITS tablet Take 1,000 Units by mouth daily.   Yes Historical Provider, MD  dofetilide (TIKOSYN) 125 MCG capsule Take 1 capsule (125 mcg total) by mouth 2 (two) times daily. 03/25/14  Yes Deboraha Sprang, MD  doxycycline (VIBRA-TABS) 100 MG tablet Take 1 tablet (100 mg total) by mouth 2 (two) times daily. 05/19/14  Yes Noralee Space, MD  esomeprazole (NEXIUM) 40 MG capsule Take 40 mg by mouth daily before breakfast.     Yes Historical Provider, MD  furosemide (LASIX) 20 MG tablet Take 40mg  daily Patient taking differently: Take 40 mg by mouth daily. Take 40mg  daily 01/06/14  Yes Elsie Stain, MD  levalbuterol [facility] HFA) 45 MCG/ACT inhaler Inhale 2 puffs into the lungs every 6 (six) hours as needed for wheezing.   Yes Historical Provider, MD  levothyroxine (SYNTHROID, LEVOTHROID) 25 MCG tablet Take 25 mcg by mouth daily before  breakfast.  03/02/13  Yes Historical Provider, MD  metoprolol succinate (TOPROL-XL) 25 MG 24 hr tablet Take 1 tablet (25 mg total) by mouth 2 (two) times daily. 03/26/14  Yes Deboraha Sprang, MD  nitroGLYCERIN (NITROSTAT) 0.4 MG SL tablet Place 0.4 mg under the tongue every 5 (five) minutes as needed for chest pain.   Yes Historical Provider, MD  Polyethyl Glycol-Propyl Glycol (SYSTANE OP) Place 1 drop into both eyes daily.    Yes Historical Provider, MD  Potassium Chloride ER 20 MEQ TBCR Take 40 mEq by mouth daily. 03/15/14  Yes Deboraha Sprang, MD  traMADol (ULTRAM) 50 MG tablet  Take 1 tablet (50 mg total) by mouth every 6 (six) hours as needed. Pain 03/17/11  Yes Posey Boyer, MD  Aclidinium Bromide (TUDORZA PRESSAIR) 400 MCG/ACT AEPB HOLD for two weeks and resume once new dentures made Patient not taking: Reported on 05/19/2014 04/28/14   Elsie Stain, MD  ELIQUIS 5 MG TABS tablet TAKE 1 TABLET TWICE A DAY Patient not taking: Reported on 05/24/2014 05/14/14   Thompson Grayer, MD   BP 159/74 mmHg  Pulse 71  Temp(Src) 97.9 F (36.6 C) (Oral)  Resp 18  SpO2 99% Physical Exam  Constitutional: She is oriented to person, place, and time. She has a sickly appearance.  HENT:  Head: Normocephalic and atraumatic.  Mouth/Throat:    Eyes: Conjunctivae and EOM are normal.  Cardiovascular: Normal rate and regular rhythm.   Pulmonary/Chest: Effort normal and breath sounds normal. No stridor. No respiratory distress.  Abdominal: She exhibits no distension.  Musculoskeletal: She exhibits no edema.  Neurological: She is alert and oriented to person, place, and time. No cranial nerve deficit.  Skin: Skin is warm and dry.  Psychiatric: She has a normal mood and affect.  Nursing note and vitals reviewed.   ED Course  Procedures (including critical care time) Labs Review Labs Reviewed  CBC - Abnormal; Notable for the following:    Platelets 426 (*)    All other components within normal limits  BASIC  METABOLIC PANEL - Abnormal; Notable for the following:    Glucose, Bld 120 (*)    BUN 28 (*)    Creatinine, Ser 1.20 (*)    GFR calc non Af Amer 42 (*)    GFR calc Af Amer 49 (*)    All other components within normal limits  CBG MONITORING, ED - Abnormal; Notable for the following:    Glucose-Capillary 110 (*)    All other components within normal limits    Imaging Review Dg Chest 2 View  05/24/2014   CLINICAL DATA:  Dehydration and shortness of Breath  EXAM: CHEST  2 VIEW  COMPARISON:  05/19/2014  FINDINGS: Cardiac shadow is stable. Postoperative changes are noted. The lungs are well aerated bilaterally without focal infiltrate or sizable effusion. Persistent atelectasis is noted in the left lung base. No new focal abnormality is seen.  IMPRESSION: No active cardiopulmonary disease.   Electronically Signed   By: Inez Catalina M.D.   On: 05/24/2014 13:37     EKG Interpretation   Date/Time:  Monday May 24 2014 12:27:00 EDT Ventricular Rate:  69 PR Interval:  148 QRS Duration: 132 QT Interval:  501 QTC Calculation: 537 R Axis:   58 Text Interpretation:  Sinus rhythm Right bundle branch block Probable  posterior infarct, acute Sinus rhythm Right bundle branch block ST-t wave  abnormality No significant change since last tracing Abnormal ekg  Confirmed by Carmin Muskrat  MD [phone]) on 05/24/2014 12:48:47 PM     Initial labs notable for elevated creatinine, so within the patient's historic range. IV fluids running.  2:58 PM Sx resolved, and the patient states that she feels entirely better.  She, her daughter and I had a lengthy conversation about all findings.  She will f/u w Dr. Henrene Pastor, GI - and be started on Diflucan- oral.   MDM   This elderly female presents about one week after elective TEE, ablation for atrial fibrillation, now with fullness in her mediastinal area, difficulty swallowing. Physical exam, history consistent with esophageal candidiasis. No evidence for  complete obstruction,  nor for mediastinal mass. No evidence for ACS or other infectious pathology. Patient had resolution of symptoms here, was discharged with course of antifungal to follow-up with GI.    Carmin Muskrat, MD 05/24/14 1500

## 2014-05-24 NOTE — Telephone Encounter (Signed)
Pt given appt today for 11:15 with CY States that she feels she needs fluids more than anything else.  Pt reports her medication Tikosyn making her dehydrated in the past where she has had to go to ED and get fluids, magnesium IV, etc... Pt states that she feels her Bronchitis is better, she is still currently taking her abx (has about 2 days left) Pt states that she does not feel this is her bronchitis making her feel this way and states that she feels that if she were to go to the hospital and get fluids she would feel 100% better.   Pt requesting PW rec's on if he feels this is a good idea.  Please advise. Thanks.

## 2014-06-03 ENCOUNTER — Ambulatory Visit (INDEPENDENT_AMBULATORY_CARE_PROVIDER_SITE_OTHER): Payer: Medicare Other | Admitting: Internal Medicine

## 2014-06-03 ENCOUNTER — Encounter: Payer: Self-pay | Admitting: Internal Medicine

## 2014-06-03 VITALS — BP 118/60 | HR 92 | Ht 66.0 in | Wt 143.5 lb

## 2014-06-03 DIAGNOSIS — K209 Esophagitis, unspecified without bleeding: Secondary | ICD-10-CM

## 2014-06-03 DIAGNOSIS — K219 Gastro-esophageal reflux disease without esophagitis: Secondary | ICD-10-CM

## 2014-06-03 DIAGNOSIS — I2510 Atherosclerotic heart disease of native coronary artery without angina pectoris: Secondary | ICD-10-CM | POA: Diagnosis not present

## 2014-06-03 DIAGNOSIS — B370 Candidal stomatitis: Secondary | ICD-10-CM | POA: Diagnosis not present

## 2014-06-03 DIAGNOSIS — J312 Chronic pharyngitis: Secondary | ICD-10-CM | POA: Diagnosis not present

## 2014-06-03 MED ORDER — FLUCONAZOLE 40 MG/ML PO SUSR: 200.0000 mg | Freq: Three times a day (TID) | ORAL | Status: DC

## 2014-06-03 NOTE — Progress Notes (Signed)
HISTORY OF PRESENT ILLNESS:  Renee [name] is a 80 y.o. female with past medical history as listed below. Renee [name] presents herself today for evaluation of "esophagitis"after being seen in the emergency room 05/24/2014. Renee [name] is accompanied today by Renee [name] husband. Renee [name] actually changed appointments with Renee [name] husband to be seen today. I saw Renee [name] 04/06/2014 when Renee [name] referred herself for complaints of right sided sore throat and soreness to the mandible with palpation. This was not felt to be gastrointestinal in origin. Renee [name] does have a history of GERD with no symptoms on PPI. Because of vague dysphagia did perform a barium esophagram which was entirely normal. Renee [name] subsequently underwent TEE and ablation therapy for H Roy arrhythmia. Renee [name] tells me that while hospitalized Renee [name] had some bleeding (presumably due to procedure-related trauma). In any event, Renee [name] complaints in the emergency room without of difficulty swallowing and fatigue. Emergency room physical examination stated that Renee [name] had one plaque on Renee [name] tongue. They felt that the symptoms were consistent with oral pharyngeal candidiasis and Renee [name] was prescribed fluconazole oral suspension. Renee [name] was instructed to follow-up in this office, for reasons that are unclear to me. In any event, Renee [name] tells me that the fluconazole seemed to help though not entirely. Renee [name] tells me that "Renee [name] is sick" but cannot elaborate. Again, Renee [name] seems a bit agitated. Therefore list on this diagnosis of "esophagitis". Renee [name] denies pyrosis, odynophagia, dysphagia, or further issues with bleeding as described. Renee [name] has lost 13 pounds since Renee [name] last visit.  REVIEW OF SYSTEMS:  All non-GI ROS negative except for anxiety, back pain, cough, heart murmur, heart rhythm change, shortness of breath, sore throat  Past Medical History  Diagnosis Date  . GERD (gastroesophageal reflux disease)   . IBS (irritable bowel syndrome)   . RBBB   . Coronary artery disease     a. CABG 09/2009. b. normal myoview in  05/2012.   [name] Anxiety   . PAF (paroxysmal atrial fibrillation)     a. Post op after CABG. Intolerant of amio in the past. b. Recurrence - started on Tikosyn 11/2013.  [name] Chronic diastolic CHF (congestive heart failure)   . Dysphagia   . Colon polyps   . Hypertension   . HLD (hyperlipidemia)   . Hypothyroidism   . Chronic bronchitis   . Emphysema/COPD     on home O2  . Sciatic nerve pain   . Orthostatic hypotension   . PVC's (premature ventricular contractions)     a. Holter 11/2013: no AF, no SVT, rare PVC couplet, occasional PACs.  . Premature atrial contractions     a. Holter 11/2013: no AF, no SVT, rare PVC couplet, occasional PACs.  . Chronic respiratory failure   . Bradycardia   . Brain aneurysm     Past Surgical History  Procedure Laterality Date  . Total hip arthroplasty Right 11/01/2008  . Appendectomy  1995  . Tonsillectomy and adenoidectomy    . Joint replacement    . Coronary artery bypass graft  09/21/2009    CABG X2  . Cardiac catheterization  1990's; 06/2009  . Cholecystectomy  2008  . Larynx surgery  ~ 1960    "tumor removed"  . Total abdominal hysterectomy  1986  . Tubal ligation  1973  . Cataract extraction w/ intraocular lens  implant, bilateral Bilateral   . Eye surgery    . Retinal laser procedure Left 10/19/2013    "had a wrinkle in it"  . Tee without cardioversion N/A 05/10/2014    Procedure:  TRANSESOPHAGEAL ECHOCARDIOGRAM (TEE);  Surgeon: Sueanne Margarita, MD;  Location: [facility] ENDOSCOPY;  Service: Cardiovascular;  Laterality: N/A;  . Atrial fibrillation ablation N/A 05/11/2014    Procedure: ATRIAL FIBRILLATION ABLATION;  Surgeon: Thompson Grayer, MD;  Location: [facility] CATH LAB;  Service: Cardiovascular;  Laterality: N/A;    Social History Leanndra SOUA LENK  reports that Renee [name] quit smoking about 4 years ago. Renee [name] smoking use included Cigarettes. Renee [name] has a 50 pack-year smoking history. Renee [name] has never used smokeless tobacco. Renee [name] reports that Renee [name] does not drink alcohol or use  illicit drugs.  family history includes Breast cancer in Renee [name] sister; Cancer in Renee [name] sister; Colon cancer in Renee [name] daughter; Colon polyps in Renee [name] brother; Deep vein thrombosis in Renee [name] mother; Diabetes in Renee [name] father; Heart attack in Renee [name] father; Heart disease in Renee [name] father and mother; Hyperlipidemia in Renee [name] father; Hypertension in Renee [name] brother, father, and mother; Other in Renee [name] sister; Transient ischemic attack in Renee [name] mother.  Allergies  Allergen Reactions  . Spiriva [Tiotropium Bromide Monohydrate] Other (See Comments)    Throat irritation  . Statins     Legs hurt  . Levofloxacin Nausea Only    Unable to tolerate more than 7days  . Budesonide-Formoterol Fumarate     Tachycardia  . Morphine     REACTION: double vision  . Penicillins     REACTION: swelling/rash  . Phenazopyridine Hcl     *piridium* REACTION: face red  . Pneumococcal Vaccines Swelling    Redding of the skin  . Prednisone Swelling  . Sulfonamide Derivatives     REACTION: rash  . Tylenol [Acetaminophen]     Tylenol #3 -- rash and swelling - takes plain Tylenol ok Per pt, Renee [name] took this spring 2015 with no problems  . Penicillins Swelling and Rash  . Sulfa Antibiotics Rash       PHYSICAL EXAMINATION: Vital signs: BP 118/60 mmHg  Pulse 92  Ht 5\' 6"  (1.676 m)  Wt 143 lb 8 oz (65.091 kg)  BMI 23.17 kg/m2 General: Well-developed, well-nourished, no acute distress HEENT: Sclerae are anicteric, conjunctiva pink. Oral mucosa intact. Renee [name] has dentures. I cannot see obvious thrush Abdomen: Not reexamined Psychiatric: alert and oriented x3. Cooperative   ASSESSMENT:  #1. Recent problems with pharyngeal discomfort and coughing or spitting up of blood as described. Although certainly trauma related to Renee [name] procedures. Objectively improved. #2. Possible recent oral pharyngeal candidiasis. Not obvious today. Patient still complaining and requesting additional course of Diflucan #3. GERD. They symptomatically on PPI. Normal  esophagram recently  PLAN:  #1. Reassurance #2. Will provide additional course of Diflucan suspension without refills. If further problems thereafter, Renee [name] should return to Renee [name] PCP #3. Reflux precautions and continue PPI for GERD #4. GI follow-up as needed

## 2014-06-03 NOTE — Patient Instructions (Signed)
We have sent the following medications to your pharmacy for you to pick up at your convenience:  fluconazole

## 2014-06-04 ENCOUNTER — Telehealth (HOSPITAL_COMMUNITY): Payer: Self-pay | Admitting: *Deleted

## 2014-06-04 NOTE — Telephone Encounter (Signed)
Patient called in stating she was very weak and fatigued with any form of activity since her ablation. Of note she did develop bronchitis and esophagitis after she was discharged from the hospital.  Patient states her BP/HR have been stable, she does not feel dizzy or lightheaded. Just states when she gets up to go somewhere or do anything she just does not have energy.  Suggested she is just deconditioned from the events in the last month; ablation, bronchitis, esophagitis (which she has lost 10lbs from) but patient begged to differ because she is not improving and started crying on phone.  Talked with Dr. Rayann Heman and suggested she see Flex NP in clinic at [address] office on Monday. Appointment made for Monday @ 1030 with Kerin Ransom. Patient was agreeable to this plan.

## 2014-06-07 ENCOUNTER — Ambulatory Visit (INDEPENDENT_AMBULATORY_CARE_PROVIDER_SITE_OTHER): Payer: Medicare Other | Admitting: Nurse Practitioner

## 2014-06-07 ENCOUNTER — Encounter: Payer: Self-pay | Admitting: Nurse Practitioner

## 2014-06-07 VITALS — BP 130/90 | HR 70 | Ht 66.0 in | Wt 147.0 lb

## 2014-06-07 DIAGNOSIS — I4891 Unspecified atrial fibrillation: Secondary | ICD-10-CM | POA: Diagnosis not present

## 2014-06-07 DIAGNOSIS — I2510 Atherosclerotic heart disease of native coronary artery without angina pectoris: Secondary | ICD-10-CM

## 2014-06-07 LAB — BASIC METABOLIC PANEL
BUN: 12 mg/dL (ref 6–23)
CO2: 26 mEq/L (ref 19–32)
Calcium: 9.4 mg/dL (ref 8.4–10.5)
Chloride: 102 mEq/L (ref 96–112)
Creatinine, Ser: 1.08 mg/dL (ref 0.40–1.20)
GFR: 52.19 mL/min — ABNORMAL LOW (ref >60.00)
Glucose, Bld: 97 mg/dL (ref 70–99)
Potassium: 4.4 mEq/L (ref 3.5–5.1)
Sodium: 138 mEq/L (ref 135–145)

## 2014-06-07 MED ORDER — DILTIAZEM HCL ER COATED BEADS 120 MG PO CP24: 120.0000 mg | ORAL_CAPSULE | Freq: Every day | ORAL | Status: DC

## 2014-06-07 NOTE — Progress Notes (Signed)
CARDIOLOGY OFFICE NOTE  Date:  06/07/2014    Renee [name] Date of Birth: 19-Jul-1934 Medical Record #[id number]  PCP:  [name], MD  Cardiologist:  Jordan/Allred/Klein    Chief Complaint  Patient presents with  . Cough    Work in visit for multitude of somatic complaints - seen for Dr. Sherrie Sport     History of Present Illness: Renee [name] is a 80 y.o. female who presents today for a work in visit. She has had atrial fibrillation for several years. This was initially diagnosed after CABG in 2011. She was placed on amiodarone but did not tolerate this medicine due to worsening SOB.She has failed on Tikosyn as well. Her other issues are as noted below.   I have not seen her in quite some time.   She has most recently underwent ablation with Dr. Rayann Heman.   In the ER last week with cough and feeling bad.  Phone call today - Patient called in stating she was very weak and fatigued with any form of activity since her ablation. Of note she did develop bronchitis and esophagitis after she was discharged from the hospital. Patient states her BP/HR have been stable, she does not feel dizzy or lightheaded. Just states when she gets up to go somewhere or do anything she just does not have energy. Suggested she is just deconditioned from the events in the last month; ablation, bronchitis, esophagitis (which she has lost 10lbs from) but patient begged to differ because she is not improving and started crying on phone. Talked with Dr. Rayann Heman and suggested she see Flex NP in clinic at [address] office on Monday. Appointment made for Monday @ 1030 with Kerin Ransom. Patient was agreeable to this plan.  Thus added to my schedule today.  Comes in today. Here with her husband. Coughing. Frustrated. Says something is wrong with her heart. Has had bronchitis since the ablation. Has had esophagitis - says no one paid any attention to her about her swallowing issues. Now SOB  with walking - can't do much without having to sit down. Says she was in the bed for 2 1/2 weeks but now trying to do more. Appetite picking back up. Cough, she says, is better. No chest pain. Very frustrated and basically just wants to know that she is ok.   Past Medical History  Diagnosis Date  . GERD (gastroesophageal reflux disease)   . IBS (irritable bowel syndrome)   . RBBB   . Coronary artery disease     a. CABG 09/2009. b. normal myoview in 05/2012.   [name] Anxiety   . PAF (paroxysmal atrial fibrillation)     a. Post op after CABG. Intolerant of amio in the past. b. Recurrence - started on Tikosyn 11/2013.  [name] Chronic diastolic CHF (congestive heart failure)   . Dysphagia   . Colon polyps   . Hypertension   . HLD (hyperlipidemia)   . Hypothyroidism   . Chronic bronchitis   . Emphysema/COPD     on home O2  . Sciatic nerve pain   . Orthostatic hypotension   . PVC's (premature ventricular contractions)     a. Holter 11/2013: no AF, no SVT, rare PVC couplet, occasional PACs.  . Premature atrial contractions     a. Holter 11/2013: no AF, no SVT, rare PVC couplet, occasional PACs.  . Chronic respiratory failure   . Bradycardia   . Brain aneurysm     Past Surgical History  Procedure Laterality Date  . Total hip arthroplasty Right 11/01/2008  . Appendectomy  1995  . Tonsillectomy and adenoidectomy    . Joint replacement    . Coronary artery bypass graft  09/21/2009    CABG X2  . Cardiac catheterization  1990's; 06/2009  . Cholecystectomy  2008  . Larynx surgery  ~ 1960    "tumor removed"  . Total abdominal hysterectomy  1986  . Tubal ligation  1973  . Cataract extraction w/ intraocular lens  implant, bilateral Bilateral   . Eye surgery    . Retinal laser procedure Left 10/19/2013    "had a wrinkle in it"  . Tee without cardioversion N/A 05/10/2014    Procedure: TRANSESOPHAGEAL ECHOCARDIOGRAM (TEE);  Surgeon: Sueanne Margarita, MD;  Location: [facility] ENDOSCOPY;  Service: Cardiovascular;   Laterality: N/A;  . Atrial fibrillation ablation N/A 05/11/2014    Procedure: ATRIAL FIBRILLATION ABLATION;  Surgeon: Thompson Grayer, MD;  Location: [facility] CATH LAB;  Service: Cardiovascular;  Laterality: N/A;     Medications: Current Outpatient Prescriptions  Medication Sig Dispense Refill  . ALPRAZolam (XANAX) 0.25 MG tablet Take 0.25 mg by mouth 2 (two) times daily as needed for anxiety or sleep.    [name] apixaban (ELIQUIS) 5 MG TABS tablet Take 1 tablet (5 mg total) by mouth 2 (two) times daily. 28 tablet 0  . chlorpheniramine-HYDROcodone (TUSSIONEX PENNKINETIC ER) 10-8 MG/5ML LQCR Take 5 mLs by mouth every 12 (twelve) hours as needed for cough. 115 mL 0  . cholecalciferol (VITAMIN D) 1000 UNITS tablet Take 1,000 Units by mouth daily.    [name] dofetilide (TIKOSYN) 125 MCG capsule Take 1 capsule (125 mcg total) by mouth 2 (two) times daily. 60 capsule 6  . doxycycline (VIBRA-TABS) 100 MG tablet Take 1 tablet (100 mg total) by mouth 2 (two) times daily. 14 tablet 0  . esomeprazole (NEXIUM) 40 MG capsule Take 40 mg by mouth daily before breakfast.      . fluconazole (DIFLUCAN) 40 MG/ML suspension Take 5 mLs (200 mg total) by mouth 3 (three) times daily. 140 mL 0  . furosemide (LASIX) 20 MG tablet Take 40mg  daily (Patient taking differently: Take 40 mg by mouth daily. Take 40mg  daily) 60 tablet 11  . levalbuterol (XOPENEX HFA) 45 MCG/ACT inhaler Inhale 2 puffs into the lungs every 6 (six) hours as needed for wheezing.    [name] levothyroxine (SYNTHROID, LEVOTHROID) 25 MCG tablet Take 25 mcg by mouth daily before breakfast.     . metoprolol succinate (TOPROL-XL) 25 MG 24 hr tablet Take 1 tablet (25 mg total) by mouth 2 (two) times daily. 30 tablet 6  . nitroGLYCERIN (NITROSTAT) 0.4 MG SL tablet Place 0.4 mg under the tongue every 5 (five) minutes as needed for chest pain.    [name] Glycol-Propyl Glycol (SYSTANE OP) Place 1 drop into both eyes daily.     . Potassium Chloride ER 20 MEQ TBCR Take 40 mEq by mouth  daily. 90 tablet 3  . traMADol (ULTRAM) 50 MG tablet Take 1 tablet (50 mg total) by mouth every 6 (six) hours as needed. Pain 30 tablet 0  . diltiazem (CARDIZEM CD) 120 MG 24 hr capsule Take 1 capsule (120 mg total) by mouth daily. 30 capsule 11  . [DISCONTINUED] budesonide-formoterol (SYMBICORT) 160-4.5 MCG/ACT inhaler Inhale 2 puffs into the lungs 2 (two) times daily. 1 Inhaler 12  . [DISCONTINUED] calcium citrate-vitamin D (CITRACAL+D) 315-200 MG-UNIT per tablet Take 1 tablet by mouth 2 (two) times daily.  No current facility-administered medications for this visit.    Allergies: Allergies  Allergen Reactions  . Spiriva [Tiotropium Bromide Monohydrate] Other (See Comments)    Throat irritation  . Statins     Legs hurt  . Levofloxacin Nausea Only    Unable to tolerate more than 7days  . Budesonide-Formoterol Fumarate     Tachycardia  . Morphine     REACTION: double vision  . Penicillins     REACTION: swelling/rash  . Phenazopyridine Hcl     *piridium* REACTION: face red  . Pneumococcal Vaccines Swelling    Redding of the skin  . Prednisone Swelling  . Sulfonamide Derivatives     REACTION: rash  . Tylenol [Acetaminophen]     Tylenol #3 -- rash and swelling - takes plain Tylenol ok Per pt, she took this spring 2015 with no problems  . Penicillins Swelling and Rash  . Sulfa Antibiotics Rash    Social History: The patient  reports that she quit smoking about 4 years ago. Her smoking use included Cigarettes. She has a 50 pack-year smoking history. She has never used smokeless tobacco. She reports that she does not drink alcohol or use illicit drugs.   Family History: The patient's family history includes Breast cancer in her sister; Cancer in her sister; Colon cancer in her daughter; Colon polyps in her brother; Deep vein thrombosis in her mother; Diabetes in her father; Heart attack in her father; Heart disease in her father and mother; Hyperlipidemia in her father;  Hypertension in her brother, father, and mother; Other in her sister; Transient ischemic attack in her mother.   Review of Systems: Please see the history of present illness.   Otherwise, the review of systems is positive for appetite getting back to normal, waking up with SOB, cough, DOE, dizziness, easy bruising, fatigue, chest pressure, skipped heart beats and anxiety.   All other systems are reviewed and negative.   Physical Exam: VS:  BP 130/90 mmHg  Pulse 70  Ht 5\' 6"  (1.676 m)  Wt 147 lb (66.679 kg)  BMI 23.74 kg/m2 .  BMI Body mass index is 23.74 kg/(m^2).   Her oxygen sat is 99% on RA today.  Wt Readings from Last 3 Encounters:  06/07/14 147 lb (66.679 kg)  06/03/14 143 lb 8 oz (65.091 kg)  05/11/14 162 lb 11.2 oz (73.8 kg)    General: Chronically ill appearing and in no acute distress. She is thin.  HEENT: Normal. Neck: Supple, no JVD, carotid bruits, or masses noted.  Cardiac: Regular rate and rhythm. No edema.  Respiratory:  Lungs with decreased breath sounds with normal work of breathing.  GI: Soft and nontender.  MS: No deformity or atrophy. Gait and ROM intact. Skin: Warm and dry. Color is normal.  Neuro:  Strength and sensation are intact and no gross focal deficits noted.  Psych: Alert, appropriate and with normal affect.  RA sat is 99%. I walked her from POD A to POD C. She was short of breath. Oxygen sat stayed at 99%. HR rose to 104.    LABORATORY DATA:  EKG:  EKG is ordered today. This demonstrates NSR with PACs.  Lab Results  Component Value Date   WBC 8.1 05/24/2014   HGB 13.1 05/24/2014   HCT 42.5 05/24/2014   PLT 426* 05/24/2014   GLUCOSE 120* 05/24/2014   CHOL * 07/16/2007    207        ATP III CLASSIFICATION:  <200  mg/dL   Desirable  200-239  mg/dL   Borderline High  >=240    mg/dL   High   TRIG 116 07/16/2007   HDL 45 07/16/2007   LDLCALC * 07/16/2007    139        Total Cholesterol/HDL:CHD Risk Coronary Heart Disease Risk  Table                     Men   Women  1/2 Average Risk   3.4   3.3   ALT 15 09/21/2009   AST 23 09/21/2009   NA 141 05/24/2014   K 3.5 05/24/2014   CL 103 05/24/2014   CREATININE 1.20* 05/24/2014   BUN 28* 05/24/2014   CO2 28 05/24/2014   TSH 1.98 01/18/2014   INR 1.22 09/21/2009   HGBA1C * 09/19/2009    5.7 (NOTE)                                                                       According to the ADA Clinical Practice Recommendations for 2011, when HbA1c is used as a screening test:   >=6.5%   Diagnostic of Diabetes Mellitus           (if abnormal result  is confirmed)  5.7-6.4%   Increased risk of developing Diabetes Mellitus  References:Diagnosis and Classification of Diabetes Mellitus,Diabetes OFBP,1025,85(IDPOE 1):S62-S69 and Standards of Medical Care in         Diabetes - 2011,Diabetes Care,2011,34  (Suppl 1):S11-S61.    BNP (last 3 results) No results for input(s): BNP in the last 8760 hours.  ProBNP (last 3 results)  Recent Labs  11/30/13 1222  PROBNP 250.9     Other Studies Reviewed Today:  Myoview Impression from 01/2014 Exercise Capacity: Lexiscan with no exercise. BP Response: Normal blood pressure response. Clinical Symptoms: No significant symptoms noted. ECG Impression: No significant ST segment change suggestive of ischemia. Comparison with Prior Nuclear Study: No significant change from previous study on 05/09/12  Overall Impression: Normal stress nuclear study.  LV Ejection Fraction: 66%. LV Wall Motion: NL LV Function; NL Wall Motion.  Electrophysiology study and radiofrequency catheter ablation on 05/11/14 by Dr Thompson Grayer. This study demonstrated sinus rhythm upon presentation; rotational Angiography reveals a moderate sized left atrium with four separate pulmonary veins without evidence of pulmonary vein stenosis; successful electrical isolation and anatomical encircling of all four pulmonary veins with radiofrequency current; no inducible  arrhythmias following ablation both on and off of dobutamine; no early apparent complications. The patient underwent TEE prior to the procedure which demonstrated normal LV function and no LAA thrombus.    Assessment/Plan: 1. paroxysmal atrial fibrillation - s/p recent ablation - she does remain on Tikosyn. She is in NSR today. HR does go up with ambulation of short distances today here in the office - will try adding back Diltiazem at low dose. I think she will continue to improve with just some time and patience.   2. CAD s/p CABG Stable without ischemic symptoms No change required today  3. HTN  4. Chronic diastolic heart failure - with complaints of DOE today - her oxygen sats are ok. HR does go up with ambulation - I am adding back Cardizem  120 mg a day to see if this helps.   5. COPD  6. Hypothyroidism  7. Recent bronchitis  8. Esophagitis - on Diflucan  Current medicines are reviewed with the patient today.  The patient does not have concerns regarding medicines other than what has been noted above.  The following changes have been made:  See above.  Labs/ tests ordered today include:    Orders Placed This Encounter  Procedures  . Basic metabolic panel  . EKG 12-Lead     Disposition:   FU as planned.    Patient is agreeable to this plan and will call if any problems develop in the interim.   Signed: Burtis Junes, RN, ANP-C 06/07/2014 11:26 AM  [facility] [address] [place], [place]  [address] Phone: [phone] Fax: [phone]

## 2014-06-07 NOTE — Patient Instructions (Addendum)
We will be checking the following labs today - BMET   Medication Instructions:    Continue with your current medicines.   I am adding Cardizem CD 120 mg daily back to your regimen - this is at the drug store    Testing/Procedures To Be Arranged:  N/A  Follow-Up:   We will see you back as planned    Other Special Instructions:   N/A  Call the [place] office at [phone] if you have any questions, problems or concerns.

## 2014-06-08 ENCOUNTER — Ambulatory Visit: Payer: Medicare Other | Admitting: Cardiology

## 2014-06-09 ENCOUNTER — Ambulatory Visit: Payer: Medicare Other | Admitting: Internal Medicine

## 2014-06-14 DIAGNOSIS — M859 Disorder of bone density and structure, unspecified: Secondary | ICD-10-CM | POA: Diagnosis not present

## 2014-06-14 DIAGNOSIS — E039 Hypothyroidism, unspecified: Secondary | ICD-10-CM | POA: Diagnosis not present

## 2014-06-14 DIAGNOSIS — Z0000 Encounter for general adult medical examination without abnormal findings: Secondary | ICD-10-CM | POA: Diagnosis not present

## 2014-06-14 DIAGNOSIS — E785 Hyperlipidemia, unspecified: Secondary | ICD-10-CM | POA: Diagnosis not present

## 2014-06-14 DIAGNOSIS — R7301 Impaired fasting glucose: Secondary | ICD-10-CM | POA: Diagnosis not present

## 2014-06-14 DIAGNOSIS — I10 Essential (primary) hypertension: Secondary | ICD-10-CM | POA: Diagnosis not present

## 2014-06-14 DIAGNOSIS — E538 Deficiency of other specified B group vitamins: Secondary | ICD-10-CM | POA: Diagnosis not present

## 2014-06-14 DIAGNOSIS — N390 Urinary tract infection, site not specified: Secondary | ICD-10-CM | POA: Diagnosis not present

## 2014-06-14 NOTE — Progress Notes (Signed)
Subjective:    Patient ID: Renee [name], female    DOB: 1934-02-21, 80 y.o.   MRN: [id number]  HPI  81 y.o.  female with known hx of COPD   04/28/2014: ROV w/ PW Chief Complaint  Patient presents with  . Follow-up    Increased SOB, chest tightness, PND, and cough with thick, clear mucus x 1 month.  Needing to use o2 more often during this time.  Scheduled for TEE on April 4 and ablation on April 5.    Pt became ill 6 weeks ago, R ear pain, sinus pressure, throat sore, cough was productive.  Rx ABX, no better>>ENT saw the pt >>said was GI>>>>GI said Bar swallow was normal.  Pt then went to dental issue.  Upper denture issue and gagging.  No new dentures yet.  Pt has mucus and cough up and build up and gets choked on this .Mucus is thick but clear.  IMP> Stable Gold C Copd with upper airway irritation of dried powder tudorza in setting of ill fitting dentures and oral irritations. On exertional prn oxygen and qhs oxygen therapy for nocturnal hypoxemia PLAN> Hold Tudorza until new dentures made.  Xopenex as needed.  Cont oxygen qhs and prn daytime: This patient continues to use and benefit from her oxygen therapy and has re qualified at this visit for continued oxygen therapy.  05/19/14: Add-on appt w/ SN 80 y/o WF w/ GOLD Stage 2 COPD (FEV1=1.44 [64%] Mar2014)> add-on appt requested for cough> she was [facility] 05/11/14 for PAF ablation by DrAllred, note reviewed, she remains on Tiskosyn, Eliquis, ToprolXL, Lasix, KCl; she had a TEE before the procedure & wonders if this irritated her esoph (she had a NEG Ba swallow 04/12/14- DrPerry);  She says that she had a low grade temp while in the hosp, assoc w/ sinus congestion/ drainage, cough & green sput, SOB; notes that she can't take OTC cough syrups due to the Tikosyn rx;  Her last CXR was 11/30/13 showing borderline heart size, s/p CABG, atherosclerotic changes in Ao, bibasilar atx L>R but no acute infiltrates...      Current pulm meds> nocturnal O2,  Tudorza, XopenexHFA... EXAM reveals Afeb, VSS, O2sat=95% on RA;  HEENT- neg, sl erythema, mallampati2;  Chest- clear w/o w/r/r;  Heart- RR, no m/r/g;  Ext- no c/c/e...   CXR 05/19/14 showed norm heart size, s/p CABG, mild tort Ao, clear lungs w/ sl elev left hemidiaph/ basilar atx/ NAD...  IMP/PLAN>>  We decided to cover her w/ Doxycycline, gave her a Depo80 shot and prescription for Tussionex for prn use...    Past Medical History  Diagnosis Date  . GERD (gastroesophageal reflux disease)   . IBS (irritable bowel syndrome)   . RBBB   . Coronary artery disease     a. CABG 09/2009. b. normal myoview in 05/2012.   [name] Anxiety   . PAF (paroxysmal atrial fibrillation)     a. Post op after CABG. Intolerant of amio in the past. b. Recurrence - started on Tikosyn 11/2013.  [name] Chronic diastolic CHF (congestive heart failure)   . Dysphagia   . Colon polyps   . Hypertension   . HLD (hyperlipidemia)   . Hypothyroidism   . Chronic bronchitis   . Emphysema/COPD     on home O2  . Sciatic nerve pain   . Orthostatic hypotension   . PVC's (premature ventricular contractions)     a. Holter 11/2013: no AF, no SVT, rare PVC couplet, occasional PACs.  [name]  Premature atrial contractions     a. Holter 11/2013: no AF, no SVT, rare PVC couplet, occasional PACs.  . Chronic respiratory failure   . Bradycardia   . Brain aneurysm     Past Surgical History  Procedure Laterality Date  . Total hip arthroplasty Right 11/01/2008  . Appendectomy  1995  . Tonsillectomy and adenoidectomy    . Joint replacement    . Coronary artery bypass graft  09/21/2009    CABG X2  . Cardiac catheterization  1990's; 06/2009  . Cholecystectomy  2008  . Larynx surgery  ~ 1960    "tumor removed"  . Total abdominal hysterectomy  1986  . Tubal ligation  1973  . Cataract extraction w/ intraocular lens  implant, bilateral Bilateral   . Eye surgery    . Retinal laser procedure Left 10/19/2013    "had a wrinkle in it"  . Tee without  cardioversion N/A 05/10/2014    Procedure: TRANSESOPHAGEAL ECHOCARDIOGRAM (TEE);  Surgeon: Sueanne Margarita, MD;  Location: [facility] ENDOSCOPY;  Service: Cardiovascular;  Laterality: N/A;  . Atrial fibrillation ablation N/A 05/11/2014    Procedure: ATRIAL FIBRILLATION ABLATION;  Surgeon: Thompson Grayer, MD;  Location: [facility] CATH LAB;  Service: Cardiovascular;  Laterality: N/A;    Outpatient Encounter Prescriptions as of 05/19/2014  Medication Sig  . ALPRAZolam (XANAX) 0.25 MG tablet Take 0.25 mg by mouth 2 (two) times daily as needed for anxiety or sleep.  [name] apixaban (ELIQUIS) 5 MG TABS tablet Take 1 tablet (5 mg total) by mouth 2 (two) times daily.  . cholecalciferol (VITAMIN D) 1000 UNITS tablet Take 1,000 Units by mouth daily.  [name] dofetilide (TIKOSYN) 125 MCG capsule Take 1 capsule (125 mcg total) by mouth 2 (two) times daily.  [name] esomeprazole (NEXIUM) 40 MG capsule Take 40 mg by mouth daily before breakfast.    . furosemide (LASIX) 20 MG tablet Take 40mg  daily (Patient taking differently: Take 40 mg by mouth daily. Take 40mg  daily)  . levalbuterol (XOPENEX HFA) 45 MCG/ACT inhaler Inhale 2 puffs into the lungs every 6 (six) hours as needed for wheezing.  [name] levothyroxine (SYNTHROID, LEVOTHROID) 25 MCG tablet Take 25 mcg by mouth daily before breakfast.   . metoprolol succinate (TOPROL-XL) 25 MG 24 hr tablet Take 1 tablet (25 mg total) by mouth 2 (two) times daily.  . nitroGLYCERIN (NITROSTAT) 0.4 MG SL tablet Place 0.4 mg under the tongue every 5 (five) minutes as needed for chest pain.  [name] Glycol-Propyl Glycol (SYSTANE OP) Place 1 drop into both eyes daily.   . Potassium Chloride ER 20 MEQ TBCR Take 40 mEq by mouth daily.  . traMADol (ULTRAM) 50 MG tablet Take 1 tablet (50 mg total) by mouth every 6 (six) hours as needed. Pain  .  Aclidinium Bromide (TUDORZA PRESSAIR) 400 MCG/ACT AEPB HOLD for two weeks and resume once new dentures made (Patient not taking: Reported on 05/19/2014)    Allergies    Allergen Reactions  . Spiriva [Tiotropium Bromide Monohydrate] Other (See Comments)    Throat irritation  . Statins     Legs hurt  . Levofloxacin Nausea Only    Unable to tolerate more than 7days  . Budesonide-Formoterol Fumarate     Tachycardia  . Morphine     REACTION: double vision  . Penicillins     REACTION: swelling/rash  . Phenazopyridine Hcl     *piridium* REACTION: face red  . Pneumococcal Vaccines Swelling    Redding of the skin  .  Prednisone Swelling  . Sulfonamide Derivatives     REACTION: rash  . Tylenol [Acetaminophen]     Tylenol #3 -- rash and swelling - takes plain Tylenol ok Per pt, she took this spring 2015 with no problems  . Penicillins Swelling and Rash  . Sulfa Antibiotics Rash    Current Medications, Allergies, Past Medical History, Past Surgical History, Family History, and Social History were reviewed in [employer] record.   Review of Systems  Constitutional:   No  weight loss, night sweats,  Fevers, chills,  +fatigue, or  lassitude.  HEENT:   No headaches,  Difficulty swallowing, +++++ Tooth/dental problems,no   Sore throat,                No sneezing, itching, ear ache,  +nasal congestion, post nasal drip,   CV:  No chest pain,  Orthopnea, PND, swelling in lower extremities, anasarca, dizziness, palpitations, syncope.   GI  No heartburn, indigestion, abdominal pain, nausea, vomiting, diarrhea, change in bowel habits, loss of appetite, bloody stools.   Resp:  .  No chest wall deformity  Skin: no rash or lesions.  GU: no dysuria, change in color of urine, no urgency or frequency.  No flank pain, no hematuria   MS:  No joint pain or swelling.  No decreased range of motion.  No back pain.  Psych:  No change in mood or affect. No depression or anxiety.  No memory loss.     Objective:   Physical Exam  BP 104/70 mmHg  Pulse 80  Temp(Src) 97.7 F (36.5 C) (Oral)  SpO2 95%  GEN: A/Ox3; pleasant , NAD, elderly    HEENT:  [place]/AT,  EACs-clear, TMs-wnl, NOSE-clear drainage  THROAT-clear, no lesions, no postnasal drip or exudate noted.   NECK:  Supple w/ fair ROM; no JVD; normal carotid impulses w/o bruits; no thyromegaly or nodules palpated; no lymphadenopathy.  RESP  Decreased BS in bases , no accessory muscle use, no dullness to percussion  CARD:  RRR, no m/r/g  , no peripheral edema, pulses intact, no cyanosis or clubbing.  GI:   Soft & nt; nml bowel sounds; no organomegaly or masses detected.  Musco: Warm bil, no deformities or joint swelling noted.   Neuro: alert, no focal deficits noted.    Skin: Warm, no lesions or rashes     Assessment & Plan:    IMP >>  GOLD Stage 2 COPD On nocturnal oxygen therapy Cough due to acute bronchitis PAF- s/p ablation  PLAN >>  We decided to cover her w/ Doxycycline, gave her a Depo80 shot and prescription for Tussionex for prn use.   Patient's Medications  New Prescriptions   CHLORPHENIRAMINE-HYDROCODONE (TUSSIONEX PENNKINETIC ER) 10-8 MG/5ML LQCR    Take 5 mLs by mouth every 12 (twelve) hours as needed for cough.   DOXYCYCLINE (VIBRA-TABS) 100 MG TABLET    Take 1 tablet (100 mg total) by mouth 2 (two) times daily.  Previous Medications   ALPRAZOLAM (XANAX) 0.25 MG TABLET    Take 0.25 mg by mouth 2 (two) times daily as needed for anxiety or sleep.   APIXABAN (ELIQUIS) 5 MG TABS TABLET    Take 1 tablet (5 mg total) by mouth 2 (two) times daily.   CHOLECALCIFEROL (VITAMIN D) 1000 UNITS TABLET    Take 1,000 Units by mouth daily.   DOFETILIDE (TIKOSYN) 125 MCG CAPSULE    Take 1 capsule (125 mcg total) by mouth 2 (two) times daily.  ESOMEPRAZOLE (NEXIUM) 40 MG CAPSULE    Take 40 mg by mouth daily before breakfast.     FUROSEMIDE (LASIX) 20 MG TABLET    Take 40mg  daily   LEVALBUTEROL (XOPENEX HFA) 45 MCG/ACT INHALER    Inhale 2 puffs into the lungs every 6 (six) hours as needed for wheezing.   LEVOTHYROXINE (SYNTHROID, LEVOTHROID) 25 MCG TABLET     Take 25 mcg by mouth daily before breakfast.    METOPROLOL SUCCINATE (TOPROL-XL) 25 MG 24 HR TABLET    Take 1 tablet (25 mg total) by mouth 2 (two) times daily.   NITROGLYCERIN (NITROSTAT) 0.4 MG SL TABLET    Place 0.4 mg under the tongue every 5 (five) minutes as needed for chest pain.   POLYETHYL GLYCOL-PROPYL GLYCOL (SYSTANE OP)    Place 1 drop into both eyes daily.    POTASSIUM CHLORIDE ER 20 MEQ TBCR    Take 40 mEq by mouth daily.   TRAMADOL (ULTRAM) 50 MG TABLET    Take 1 tablet (50 mg total) by mouth every 6 (six) hours as needed. Pain  Modified Medications   Modified Medication Previous Medication   FLUCONAZOLE (DIFLUCAN) 40 MG/ML SUSPENSION fluconazole (DIFLUCAN) 40 MG/ML suspension      Take 5 mLs (200 mg total) by mouth 3 (three) times daily.    Take 5 mLs (200 mg total) by mouth 3 (three) times daily.   ACLIDINIUM BROMIDE (TUDORZA PRESSAIR) 400 MCG/ACT AEPB    HOLD for two weeks and resume once new dentures made

## 2014-06-15 ENCOUNTER — Other Ambulatory Visit: Payer: Self-pay

## 2014-06-15 ENCOUNTER — Ambulatory Visit (HOSPITAL_COMMUNITY)
Admit: 2014-06-15 | Discharge: 2014-06-15 | Disposition: A | Discharge status: Home / Self Care | Payer: Medicare Other | Source: Ambulatory Visit | Attending: Nurse Practitioner | Admitting: Nurse Practitioner

## 2014-06-15 ENCOUNTER — Encounter (HOSPITAL_COMMUNITY): Payer: Self-pay | Admitting: Nurse Practitioner

## 2014-06-15 VITALS — BP 122/84 | HR 96 | Ht 66.0 in | Wt 145.0 lb

## 2014-06-15 DIAGNOSIS — R0600 Dyspnea, unspecified: Secondary | ICD-10-CM

## 2014-06-15 DIAGNOSIS — R1310 Dysphagia, unspecified: Secondary | ICD-10-CM | POA: Insufficient documentation

## 2014-06-15 DIAGNOSIS — I4891 Unspecified atrial fibrillation: Secondary | ICD-10-CM

## 2014-06-15 DIAGNOSIS — J449 Chronic obstructive pulmonary disease, unspecified: Secondary | ICD-10-CM | POA: Diagnosis not present

## 2014-06-15 NOTE — Progress Notes (Signed)
Patient ID: Renee [name], female   DOB: [birth year], 80 y.o.   MRN: [id number]  Primary Care Physician: [name], MD Referring Physician:   SRIYA KROEZE is a 80 y.o. female with a h/o CAD, s/p bypass , afib not tolerating amiodarone in the past and currently remains on tikosyn, COPD, and afib ablation 4/5, for f/u in the afib clinic. As far as patient can tell, she has not had any afib since surgery but has had increased heart rare with exertion which limits her ability to do her daily activities. She has a pulse oximeter at home and her heart rate with in one minute of activity will climb to 110. She becomes short of breath, chest heaviness and has to sit or lie down. She reports as long as she is non exertional she is asymptomatic. She has also had longstanding issues with out being able to swallow well, which got worse after the procedure. She had a swallowing test which was normal but was dx with yeast of the mouth and throat and has been treated with two rounds of diflucan. She also had bronchitis symptoms since procedure and saw her pulmonologist and was prescribed antibiotics. She also saw Truitt Merle, NP, last week and was restarted on cardizem for elevation of heart rate with activity. Has not helped symptoms.She has had two chest x-rays since PVI and both without any active CV process. She had labs drawn yesterday which are not in Epic pending physical with her PCP. She is very discouraged that she cannot do any activities due to elevation of heart rate and dyspnea, chest pressure, which makes her stop and rest, significantly contributing to  very poor quality of life. Had a negative nuclear stress test in December 2015.  Today, she denies symptoms of orthopnea, PND, lower extremity edema, dizziness, presyncope, syncope, or neurologic sequela. Positive for exertional dyspnea, chest heaviness with exertion, elevation of heart rate with exertion. The patient is tolerating  medications without difficulties and is otherwise without complaint today.   Past Medical History  Diagnosis Date  . GERD (gastroesophageal reflux disease)   . IBS (irritable bowel syndrome)   . RBBB   . Coronary artery disease     a. CABG 09/2009. b. normal myoview in 05/2012.   [name] Anxiety   . PAF (paroxysmal atrial fibrillation)     a. Post op after CABG. Intolerant of amio in the past. b. Recurrence - started on Tikosyn 11/2013.  [name] Chronic diastolic CHF (congestive heart failure)   . Dysphagia   . Colon polyps   . Hypertension   . HLD (hyperlipidemia)   . Hypothyroidism   . Chronic bronchitis   . Emphysema/COPD     on home O2  . Sciatic nerve pain   . Orthostatic hypotension   . PVC's (premature ventricular contractions)     a. Holter 11/2013: no AF, no SVT, rare PVC couplet, occasional PACs.  . Premature atrial contractions     a. Holter 11/2013: no AF, no SVT, rare PVC couplet, occasional PACs.  . Chronic respiratory failure   . Bradycardia   . Brain aneurysm    Past Surgical History  Procedure Laterality Date  . Total hip arthroplasty Right 11/01/2008  . Appendectomy  1995  . Tonsillectomy and adenoidectomy    . Joint replacement    . Coronary artery bypass graft  09/21/2009    CABG X2  . Cardiac catheterization  1990's; 06/2009  . Cholecystectomy  2008  .  Larynx surgery  ~ 1960    "tumor removed"  . Total abdominal hysterectomy  1986  . Tubal ligation  1973  . Cataract extraction w/ intraocular lens  implant, bilateral Bilateral   . Eye surgery    . Retinal laser procedure Left 10/19/2013    "had a wrinkle in it"  . Tee without cardioversion N/A 05/10/2014    Procedure: TRANSESOPHAGEAL ECHOCARDIOGRAM (TEE);  Surgeon: Sueanne Margarita, MD;  Location: [facility] ENDOSCOPY;  Service: Cardiovascular;  Laterality: N/A;  . Atrial fibrillation ablation N/A 05/11/2014    Procedure: ATRIAL FIBRILLATION ABLATION;  Surgeon: Thompson Grayer, MD;  Location: [facility] CATH LAB;  Service:  Cardiovascular;  Laterality: N/A;    Current Outpatient Prescriptions  Medication Sig Dispense Refill  . ALPRAZolam (XANAX) 0.25 MG tablet Take 0.25 mg by mouth 2 (two) times daily as needed for anxiety or sleep.    [name] apixaban (ELIQUIS) 5 MG TABS tablet Take 1 tablet (5 mg total) by mouth 2 (two) times daily. 28 tablet 0  . cholecalciferol (VITAMIN D) 1000 UNITS tablet Take 1,000 Units by mouth daily.    [name] diltiazem (CARDIZEM CD) 120 MG 24 hr capsule Take 1 capsule (120 mg total) by mouth daily. 30 capsule 11  . dofetilide (TIKOSYN) 125 MCG capsule Take 1 capsule (125 mcg total) by mouth 2 (two) times daily. 60 capsule 6  . esomeprazole (NEXIUM) 40 MG capsule Take 40 mg by mouth daily before breakfast.      . furosemide (LASIX) 20 MG tablet Take 40mg  daily (Patient taking differently: Take 40 mg by mouth daily. Take 40mg  daily) 60 tablet 11  . levalbuterol (XOPENEX HFA) 45 MCG/ACT inhaler Inhale 2 puffs into the lungs every 6 (six) hours as needed for wheezing.    [name] levothyroxine (SYNTHROID, LEVOTHROID) 25 MCG tablet Take 25 mcg by mouth daily before breakfast.     . metoprolol succinate (TOPROL-XL) 25 MG 24 hr tablet Take 1 tablet (25 mg total) by mouth 2 (two) times daily. 30 tablet 6  . nitroGLYCERIN (NITROSTAT) 0.4 MG SL tablet Place 0.4 mg under the tongue every 5 (five) minutes as needed for chest pain.    [name] Glycol-Propyl Glycol (SYSTANE OP) Place 1 drop into both eyes daily.     . Potassium Chloride ER 20 MEQ TBCR Take 40 mEq by mouth daily. 90 tablet 3  . traMADol (ULTRAM) 50 MG tablet Take 1 tablet (50 mg total) by mouth every 6 (six) hours as needed. Pain 30 tablet 0  . [DISCONTINUED] budesonide-formoterol (SYMBICORT) 160-4.5 MCG/ACT inhaler Inhale 2 puffs into the lungs 2 (two) times daily. 1 Inhaler 12  . [DISCONTINUED] calcium citrate-vitamin D (CITRACAL+D) 315-200 MG-UNIT per tablet Take 1 tablet by mouth 2 (two) times daily.       No current facility-administered  medications for this encounter.    Allergies  Allergen Reactions  . Spiriva [Tiotropium Bromide Monohydrate] Other (See Comments)    Throat irritation  . Statins     Legs hurt  . Levofloxacin Nausea Only    Unable to tolerate more than 7days  . Budesonide-Formoterol Fumarate     Tachycardia  . Morphine     REACTION: double vision  . Penicillins     REACTION: swelling/rash  . Phenazopyridine Hcl     *piridium* REACTION: face red  . Pneumococcal Vaccines Swelling    Redding of the skin  . Prednisone Swelling  . Sulfonamide Derivatives     REACTION: rash  . Tylenol [Acetaminophen]  Tylenol #3 -- rash and swelling - takes plain Tylenol ok Per pt, she took this spring 2015 with no problems  . Penicillins Swelling and Rash  . Sulfa Antibiotics Rash    History   Social History  . Marital Status: Married    Spouse Name: N/A  . Number of Children: 1  . Years of Education: N/A   Occupational History  . nurse     retired   Social History Main Topics  . Smoking status: Former Smoker -- 1.00 packs/day for 50 years    Types: Cigarettes    Quit date: 09/21/2009  . Smokeless tobacco: Never Used  . Alcohol Use: No  . Drug Use: No  . Sexual Activity: No   Other Topics Concern  . Not on file   Social History Narrative        Family History  Problem Relation Age of Onset  . Heart disease Mother   . Transient ischemic attack Mother   . Hypertension Mother   . Deep vein thrombosis Mother   . Heart attack Father   . Hypertension Father   . Diabetes Father   . Heart disease Father     Heart disease before age 61  . Hyperlipidemia Father   . Breast cancer Sister   . Cancer Sister   . Other Sister     brain tumor  . Colon polyps Brother   . Hypertension Brother   . Colon cancer Daughter     ROS- All systems are reviewed and negative except as per the HPI above  Physical Exam: Filed Vitals:   06/15/14 1011  BP: 122/84  Pulse: 96  Height: 5\' 6"  (1.676 m)    Weight: 145 lb (65.772 kg)    GEN- The patient is well appearing, alert and oriented x 3 today. Anxious, somewhat disgruntled.  Head- normocephalic, atraumatic Eyes-  Sclera clear, conjunctiva pink Ears- hearing intact Oropharynx- clear Neck- supple, no JVP Lymph- no cervical lymphadenopathy Lungs- Clear to ausculation bilaterally, normal work of breathing Heart- Regular rate and rhythm, no murmurs, rubs or gallops, PMI not laterally displaced GI- soft, NT, ND, + BS Extremities- no clubbing, cyanosis, or edema MS- no significant deformity or atrophy Skin- no rash or lesion Psych- euthymic mood, full affect Neuro- strength and sensation are intact  EKG- NSR with RBBB, at 96 bpm, QTc 485 ms.  Assessment and Plan: 1. Afib s/p ablation Procedure appears to be successful, without any c/o of afib, but with complaints of elevated heart rate with activity. Question deconditioning. No change in current drugs 48 hour monitor to further assess for tachycardia/?afib Echocardiogram looking  for any procedure related changes Consideration for repeat of stress test, with h/o CAD, if no other reason for symptoms can be uncovered.  2. COPD F/u with pulmonologist as scheduled  3. Swallowing difficulties Better with Diflucan but still not fully resloved  Will review above tests with Dr. Rayann Heman when available to determine f/u.

## 2014-06-15 NOTE — Patient Instructions (Signed)
Your physician has requested that you have an echocardiogram. Echocardiography is a painless test that uses sound waves to create images of your heart. It provides your doctor with information about the size and shape of your heart and how well your heart's chambers and valves are working. This procedure takes approximately one hour. There are no restrictions for this procedure.  Echo is scheduled for Friday, May 13th at 1pm in heart and vascular -- parking code is [phone]  Your physician has recommended that you wear a holter monitor. Holter monitors are medical devices that record the heart's electrical activity. Doctors most often use these monitors to diagnose arrhythmias. Arrhythmias are problems with the speed or rhythm of the heartbeat. The monitor is a small, portable device. You can wear one while you do your normal daily activities. This is usually used to diagnose what is causing palpitations/syncope (passing out).  holter monitor fitting is scheduled for Friday, May 13th at 230pm over at [facility]   We will call you after the results of both testing if follow up appointments before your appointment with Dr. Rayann Heman in August.

## 2014-06-18 ENCOUNTER — Ambulatory Visit (INDEPENDENT_AMBULATORY_CARE_PROVIDER_SITE_OTHER): Payer: Medicare Other

## 2014-06-18 ENCOUNTER — Ambulatory Visit (HOSPITAL_COMMUNITY)
Admission: RE | Admit: 2014-06-18 | Discharge: 2014-06-18 | Disposition: A | Discharge status: Home / Self Care | Payer: Medicare Other | Source: Ambulatory Visit | Attending: Nurse Practitioner | Admitting: Nurse Practitioner

## 2014-06-18 DIAGNOSIS — I4891 Unspecified atrial fibrillation: Secondary | ICD-10-CM

## 2014-06-18 DIAGNOSIS — I081 Rheumatic disorders of both mitral and tricuspid valves: Secondary | ICD-10-CM | POA: Diagnosis not present

## 2014-06-18 DIAGNOSIS — I509 Heart failure, unspecified: Secondary | ICD-10-CM | POA: Diagnosis not present

## 2014-06-18 DIAGNOSIS — R0600 Dyspnea, unspecified: Secondary | ICD-10-CM | POA: Diagnosis not present

## 2014-06-18 DIAGNOSIS — J449 Chronic obstructive pulmonary disease, unspecified: Secondary | ICD-10-CM | POA: Insufficient documentation

## 2014-06-18 DIAGNOSIS — I2510 Atherosclerotic heart disease of native coronary artery without angina pectoris: Secondary | ICD-10-CM | POA: Diagnosis not present

## 2014-06-18 DIAGNOSIS — E785 Hyperlipidemia, unspecified: Secondary | ICD-10-CM | POA: Diagnosis not present

## 2014-06-18 NOTE — Progress Notes (Signed)
  Echocardiogram 2D Echocardiogram has been performed.  Renee [name] [name] 06/18/2014, 2:03 PM

## 2014-06-19 DIAGNOSIS — I4891 Unspecified atrial fibrillation: Secondary | ICD-10-CM | POA: Diagnosis not present

## 2014-06-21 DIAGNOSIS — Z1389 Encounter for screening for other disorder: Secondary | ICD-10-CM | POA: Diagnosis not present

## 2014-06-21 DIAGNOSIS — J449 Chronic obstructive pulmonary disease, unspecified: Secondary | ICD-10-CM | POA: Diagnosis not present

## 2014-06-21 DIAGNOSIS — I25810 Atherosclerosis of coronary artery bypass graft(s) without angina pectoris: Secondary | ICD-10-CM | POA: Diagnosis not present

## 2014-06-21 DIAGNOSIS — I10 Essential (primary) hypertension: Secondary | ICD-10-CM | POA: Diagnosis not present

## 2014-06-21 DIAGNOSIS — E538 Deficiency of other specified B group vitamins: Secondary | ICD-10-CM | POA: Diagnosis not present

## 2014-06-21 DIAGNOSIS — I480 Paroxysmal atrial fibrillation: Secondary | ICD-10-CM | POA: Diagnosis not present

## 2014-06-21 DIAGNOSIS — E785 Hyperlipidemia, unspecified: Secondary | ICD-10-CM | POA: Diagnosis not present

## 2014-06-21 DIAGNOSIS — R7301 Impaired fasting glucose: Secondary | ICD-10-CM | POA: Diagnosis not present

## 2014-06-21 DIAGNOSIS — I252 Old myocardial infarction: Secondary | ICD-10-CM | POA: Diagnosis not present

## 2014-06-21 DIAGNOSIS — Z0000 Encounter for general adult medical examination without abnormal findings: Secondary | ICD-10-CM | POA: Diagnosis not present

## 2014-06-23 ENCOUNTER — Other Ambulatory Visit: Payer: Self-pay | Admitting: Internal Medicine

## 2014-06-23 DIAGNOSIS — F17290 Nicotine dependence, other tobacco product, uncomplicated: Secondary | ICD-10-CM

## 2014-06-25 DIAGNOSIS — S8991XA Unspecified injury of right lower leg, initial encounter: Secondary | ICD-10-CM | POA: Diagnosis not present

## 2014-06-25 DIAGNOSIS — M85861 Other specified disorders of bone density and structure, right lower leg: Secondary | ICD-10-CM | POA: Diagnosis not present

## 2014-06-28 ENCOUNTER — Encounter: Payer: Self-pay | Admitting: Internal Medicine

## 2014-06-28 DIAGNOSIS — Z961 Presence of intraocular lens: Secondary | ICD-10-CM | POA: Diagnosis not present

## 2014-06-28 DIAGNOSIS — H524 Presbyopia: Secondary | ICD-10-CM | POA: Diagnosis not present

## 2014-06-28 DIAGNOSIS — H35033 Hypertensive retinopathy, bilateral: Secondary | ICD-10-CM | POA: Diagnosis not present

## 2014-06-28 DIAGNOSIS — H35372 Puckering of macula, left eye: Secondary | ICD-10-CM | POA: Diagnosis not present

## 2014-06-30 ENCOUNTER — Ambulatory Visit
Admission: RE | Admit: 2014-06-30 | Discharge: 2014-06-30 | Disposition: A | Discharge status: Home / Self Care | Payer: Medicare Other | Source: Ambulatory Visit | Attending: Internal Medicine | Admitting: Internal Medicine

## 2014-06-30 ENCOUNTER — Telehealth (HOSPITAL_COMMUNITY): Payer: Self-pay | Admitting: Nurse Practitioner

## 2014-06-30 DIAGNOSIS — Z87891 Personal history of nicotine dependence: Secondary | ICD-10-CM | POA: Diagnosis not present

## 2014-06-30 DIAGNOSIS — Z122 Encounter for screening for malignant neoplasm of respiratory organs: Secondary | ICD-10-CM | POA: Diagnosis not present

## 2014-06-30 DIAGNOSIS — J432 Centrilobular emphysema: Secondary | ICD-10-CM | POA: Diagnosis not present

## 2014-06-30 DIAGNOSIS — F17290 Nicotine dependence, other tobacco product, uncomplicated: Secondary | ICD-10-CM

## 2014-06-30 NOTE — Telephone Encounter (Signed)
Reviewed with pt results of monitor. No afib, no significant arrhythmia's. Pt is actually feeling improved.

## 2014-07-02 DIAGNOSIS — L821 Other seborrheic keratosis: Secondary | ICD-10-CM | POA: Diagnosis not present

## 2014-08-03 DIAGNOSIS — E538 Deficiency of other specified B group vitamins: Secondary | ICD-10-CM | POA: Diagnosis not present

## 2014-08-03 DIAGNOSIS — M8580 Other specified disorders of bone density and structure, unspecified site: Secondary | ICD-10-CM | POA: Diagnosis not present

## 2014-08-16 ENCOUNTER — Ambulatory Visit (INDEPENDENT_AMBULATORY_CARE_PROVIDER_SITE_OTHER): Payer: Medicare Other | Admitting: Critical Care Medicine

## 2014-08-16 ENCOUNTER — Other Ambulatory Visit: Payer: Self-pay | Admitting: Internal Medicine

## 2014-08-16 ENCOUNTER — Encounter: Payer: Self-pay | Admitting: Critical Care Medicine

## 2014-08-16 VITALS — BP 118/72 | HR 73 | Temp 98.0°F | Wt 146.0 lb

## 2014-08-16 DIAGNOSIS — I2510 Atherosclerotic heart disease of native coronary artery without angina pectoris: Secondary | ICD-10-CM | POA: Diagnosis not present

## 2014-08-16 DIAGNOSIS — J418 Mixed simple and mucopurulent chronic bronchitis: Secondary | ICD-10-CM

## 2014-08-16 DIAGNOSIS — J9611 Chronic respiratory failure with hypoxia: Secondary | ICD-10-CM

## 2014-08-16 DIAGNOSIS — J449 Chronic obstructive pulmonary disease, unspecified: Secondary | ICD-10-CM | POA: Diagnosis not present

## 2014-08-16 NOTE — Patient Instructions (Signed)
No change in medications. Return in         4 months 

## 2014-08-16 NOTE — Progress Notes (Signed)
Subjective:    Patient ID: Renee [name], female    DOB: May 17, 1934, 80 y.o.   MRN: [id number]  HPI 08/16/2014 Chief Complaint  Patient presents with  . Follow-up    breathing is good some days: SOB happens at any time of day or night; no cough but spitting up clear/yellow mucus;     Pt fell recently and injured the knee.  Dyspnea comes and goes.  No real cough, occ mucus, occ yellow green and occ blood.  Blood is mixed with mucus: this is not new , Lung CT screen normal: no CA seen 07/01/14  Pt denies any significant sore throat, nasal congestion or excess secretions, fever, chills, sweats, unintended weight loss, pleurtic or exertional chest pain, orthopnea PND, or leg swelling Pt denies any increase in rescue therapy over baseline, denies waking up needing it or having any early am or nocturnal exacerbations of coughing/wheezing/or dyspnea. Pt also denies any obvious fluctuation in symptoms with  weather or environmental change or other alleviating or aggravating factors   Current Medications, Allergies, Complete Past Medical History, Past Surgical History, Family History, and Social History were reviewed in [place] record per todays encounter:  08/16/2014  Review of Systems  Constitutional: Negative.   HENT: Negative.  Negative for ear pain, postnasal drip, rhinorrhea, sinus pressure, sore throat, trouble swallowing and voice change.   Eyes: Negative.   Respiratory: Positive for cough, chest tightness, shortness of breath and wheezing. Negative for apnea, choking and stridor.        Hemoptysis  Cardiovascular: Negative.  Negative for chest pain, palpitations and leg swelling.  Gastrointestinal: Negative.  Negative for nausea, vomiting, abdominal pain and abdominal distention.  Genitourinary: Negative.   Musculoskeletal: Negative.  Negative for myalgias and arthralgias.  Skin: Negative.  Negative for rash.  Allergic/Immunologic: Negative.  Negative for  environmental allergies and food allergies.  Neurological: Negative.  Negative for dizziness, syncope, weakness and headaches.  Hematological: Negative.  Negative for adenopathy. Does not bruise/bleed easily.  Psychiatric/Behavioral: Negative.  Negative for sleep disturbance and agitation. The patient is not nervous/anxious.        Objective:   Physical Exam Filed Vitals:   08/16/14 1122  BP: 118/72  Pulse: 73  Temp: 98 F (36.7 C)  TempSrc: Oral  Weight: 146 lb (66.225 kg)  SpO2: 98%    Gen: Pleasant, well-nourished, in no distress,  normal affect  ENT: No lesions,  mouth clear,  oropharynx clear, no postnasal drip  Neck: No JVD, no TMG, no carotid bruits  Lungs: No use of accessory muscles, no dullness to percussion, distant bs,   Cardiovascular: RRR, heart sounds normal, no murmur or gallops, no peripheral edema  Abdomen: soft and NT, no HSM,  BS normal  Musculoskeletal: No deformities, no cyanosis or clubbing  Neuro: alert, non focal  Skin: Warm, no lesions or rashes  No results found.        Assessment & Plan:  I personally reviewed all images and lab data in the [facility] system as well as any outside material available during this office visit and agree with the  radiology impressions.   COPD (chronic obstructive pulmonary disease) Gold C Copd very fragile, high risk patient, high fall risk, high flow oxygen Stable at present Plan Cont inhaled meds No other changes Cont oxygen Rx This patient continues to use and benefit from her oxygen therapy and has re qualified at this visit for continued oxygen therapy   Chronic respiratory failure  with hypoxia Chronic resp failure hypoxemia Plan  Cont oxygen therapy    Renee [name] was seen today for follow-up.  Diagnoses and all orders for this visit:  COPD with chronic bronchitis Orders: -     AMB Referral to [place] Management  Mixed simple and mucopurulent chronic bronchitis  Chronic respiratory failure  with hypoxia    I had an extended discussion with the patient and or family lasting 10 minutes of a 25 minute visit including:  Disease state, need for home based case management with thn, Oxygen therapy

## 2014-08-17 ENCOUNTER — Other Ambulatory Visit: Payer: Self-pay

## 2014-08-17 MED ORDER — APIXABAN 5 MG PO TABS: 5.0000 mg | ORAL_TABLET | Freq: Two times a day (BID) | ORAL | Status: AC

## 2014-08-18 DIAGNOSIS — J9611 Chronic respiratory failure with hypoxia: Secondary | ICD-10-CM | POA: Insufficient documentation

## 2014-08-18 NOTE — Assessment & Plan Note (Signed)
Gold C Copd very fragile, high risk patient, high fall risk, high flow oxygen Stable at present Plan Cont inhaled meds No other changes Cont oxygen Rx This patient continues to use and benefit from her oxygen therapy and has re qualified at this visit for continued oxygen therapy

## 2014-08-18 NOTE — Assessment & Plan Note (Signed)
Chronic resp failure hypoxemia Plan  Cont oxygen therapy

## 2014-08-18 NOTE — Patient Outreach (Signed)
[facility]) Care Management  08/18/2014  Renee [name] 08/12/1934 [id number]   Referral from MD,  Assigned Renee Plowman, RN to outreach.  [name]. [name], [place] Management [place] Assistant Phone: [phone] Fax: [phone]

## 2014-08-25 ENCOUNTER — Telehealth: Payer: Self-pay | Admitting: Internal Medicine

## 2014-08-25 NOTE — Telephone Encounter (Signed)
Talked with donna carroll np regarding patients question regarding tikosyn generic.  Butch Penny would prefer patient stay on brand name since the generic is new and unsure of the total effectiveness of the generic form and the generic will not be much cheaper than the brand name.  Left message on voicemail informing patient of this and to call back with which she needs called in.

## 2014-08-25 NOTE — Telephone Encounter (Signed)
New message      Pt is wanting to know if doctor will write her Tikosyn prescription for the generic.  She went to pharmacy yesterday and the prescription was to expensive.  Pt has appt on August 15th to discuss possibly coming off the tikosyn.  Pt states she will be unavailable today from 2-4 Please call to discuss

## 2014-08-26 NOTE — Telephone Encounter (Signed)
Patient called back and stated she decided to buy the brand with hopes that Dr. Rayann Heman will stop the tikosyn at her next visit in 3 weeks.

## 2014-08-30 ENCOUNTER — Other Ambulatory Visit: Payer: Self-pay

## 2014-08-30 MED ORDER — DOFETILIDE 125 MCG PO CAPS: 125.0000 ug | ORAL_CAPSULE | Freq: Two times a day (BID) | ORAL | Status: DC

## 2014-08-31 DIAGNOSIS — J329 Chronic sinusitis, unspecified: Secondary | ICD-10-CM | POA: Diagnosis not present

## 2014-08-31 DIAGNOSIS — J309 Allergic rhinitis, unspecified: Secondary | ICD-10-CM | POA: Diagnosis not present

## 2014-08-31 DIAGNOSIS — I480 Paroxysmal atrial fibrillation: Secondary | ICD-10-CM | POA: Diagnosis not present

## 2014-08-31 DIAGNOSIS — Z6824 Body mass index (BMI) 24.0-24.9, adult: Secondary | ICD-10-CM | POA: Diagnosis not present

## 2014-08-31 DIAGNOSIS — R51 Headache: Secondary | ICD-10-CM | POA: Diagnosis not present

## 2014-09-14 DIAGNOSIS — H3531 Nonexudative age-related macular degeneration: Secondary | ICD-10-CM | POA: Diagnosis not present

## 2014-09-14 DIAGNOSIS — H35372 Puckering of macula, left eye: Secondary | ICD-10-CM | POA: Diagnosis not present

## 2014-09-20 ENCOUNTER — Ambulatory Visit (INDEPENDENT_AMBULATORY_CARE_PROVIDER_SITE_OTHER): Payer: Medicare Other | Admitting: Internal Medicine

## 2014-09-20 ENCOUNTER — Encounter: Payer: Self-pay | Admitting: Internal Medicine

## 2014-09-20 VITALS — BP 120/82 | HR 80 | Ht 66.0 in | Wt 145.6 lb

## 2014-09-20 DIAGNOSIS — J9611 Chronic respiratory failure with hypoxia: Secondary | ICD-10-CM | POA: Diagnosis not present

## 2014-09-20 DIAGNOSIS — I480 Paroxysmal atrial fibrillation: Secondary | ICD-10-CM | POA: Diagnosis not present

## 2014-09-20 DIAGNOSIS — I2510 Atherosclerotic heart disease of native coronary artery without angina pectoris: Secondary | ICD-10-CM

## 2014-09-20 MED ORDER — METOPROLOL SUCCINATE ER 25 MG PO TB24: 25.0000 mg | ORAL_TABLET | Freq: Every day | ORAL | Status: DC

## 2014-09-20 NOTE — Patient Instructions (Signed)
Medication Instructions:  Your physician has recommended you make the following change in your medication:  1) Stop Tikosyn 2) Decrease Metoprolol to 25 mg daily   Labwork: None ordered  Testing/Procedures: None ordered  Follow-Up: Your physician recommends that you schedule a follow-up appointment in: 3 months with Dr Rayann Heman   Any Other Special Instructions Will Be Listed Below (If Applicable).

## 2014-09-20 NOTE — Progress Notes (Signed)
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 female who presents today for routine electrophysiology followup.  Since her ablation, the patient reports doing well.  She has had no symptoms of AF.  Her SOB is unchanged.  She has difficulty with swallowing which was present prior to ablation.  Today, she denies symptoms of palpitations, chest pain,  lower extremity edema, dizziness, presyncope, or syncope.  The patient is otherwise without complaint today.   Past Medical History  Diagnosis Date  . GERD (gastroesophageal reflux disease)   . IBS (irritable bowel syndrome)   . RBBB   . Coronary artery disease     a. CABG 09/2009. b. normal myoview in 05/2012.   [name] Anxiety   . PAF (paroxysmal atrial fibrillation)     a. Post op after CABG. Intolerant of amio in the past. b. Recurrence - started on Tikosyn 11/2013.  [name] Chronic diastolic CHF (congestive heart failure)   . Dysphagia   . Colon polyps   . Hypertension   . HLD (hyperlipidemia)   . Hypothyroidism   . Chronic bronchitis   . Emphysema/COPD     on home O2  . Sciatic nerve pain   . Orthostatic hypotension   . PVC's (premature ventricular contractions)     a. Holter 11/2013: no AF, no SVT, rare PVC couplet, occasional PACs.  . Premature atrial contractions     a. Holter 11/2013: no AF, no SVT, rare PVC couplet, occasional PACs.  . Chronic respiratory failure   . Bradycardia   . Brain aneurysm    Past Surgical History  Procedure Laterality Date  . Total hip arthroplasty Right 11/01/2008  . Appendectomy  1995  . Tonsillectomy and adenoidectomy    . Joint replacement    . Coronary artery bypass graft  09/21/2009    CABG X2  . Cardiac catheterization  1990's; 06/2009  . Cholecystectomy  2008  . Larynx surgery  ~ 1960    "tumor removed"  . Total abdominal hysterectomy  1986  . Tubal ligation  1973  . Cataract extraction w/ intraocular lens  implant, bilateral Bilateral   . Eye surgery    . Retinal  laser procedure Left 10/19/2013    "had a wrinkle in it"  . Tee without cardioversion N/A 05/10/2014    Procedure: TRANSESOPHAGEAL ECHOCARDIOGRAM (TEE);  Surgeon: Sueanne Margarita, MD;  Location: [facility] ENDOSCOPY;  Service: Cardiovascular;  Laterality: N/A;  . Atrial fibrillation ablation N/A 05/11/2014    Procedure: ATRIAL FIBRILLATION ABLATION;  Surgeon: Thompson Grayer, MD;  Location: [facility] CATH LAB;  Service: Cardiovascular;  Laterality: N/A;    ROS- all systems are reviewed and negatives except as per HPI above  Current Outpatient Prescriptions  Medication Sig Dispense Refill  . ALPRAZolam (XANAX) 0.25 MG tablet Take 0.25 mg by mouth 2 (two) times daily as needed for anxiety or sleep.    [name] apixaban (ELIQUIS) 5 MG TABS tablet Take 1 tablet (5 mg total) by mouth 2 (two) times daily. 60 tablet 1  . cholecalciferol (VITAMIN D) 1000 UNITS tablet Take 1,000 Units by mouth daily.    [name] dofetilide (TIKOSYN) 125 MCG capsule Take 1 capsule (125 mcg total) by mouth 2 (two) times daily. 60 capsule 6  . esomeprazole (NEXIUM) 40 MG capsule Take 40 mg by mouth daily before breakfast.      . furosemide (LASIX) 40 MG tablet Take 40 mg by mouth daily.    [name] levalbuterol [facility]  HFA) 45 MCG/ACT inhaler Inhale 2 puffs into the lungs every 6 (six) hours as needed for wheezing.    [name] levothyroxine (SYNTHROID, LEVOTHROID) 25 MCG tablet Take 25 mcg by mouth daily before breakfast.     . metoprolol succinate (TOPROL-XL) 25 MG 24 hr tablet Take 1 tablet (25 mg total) by mouth 2 (two) times daily. 30 tablet 6  . nitroGLYCERIN (NITROSTAT) 0.4 MG SL tablet Place 0.4 mg under the tongue every 5 (five) minutes as needed for chest pain.    [name] Glycol-Propyl Glycol (SYSTANE OP) Place 1 drop into both eyes 2 (two) times daily.     . potassium chloride SA (K-DUR,KLOR-CON) 20 MEQ tablet Take 20 mEq by mouth 2 (two) times daily.    . traMADol (ULTRAM) 50 MG tablet Take 1 tablet (50 mg total) by mouth every 6 (six) hours as needed.  Pain 30 tablet 0  . [DISCONTINUED] budesonide-formoterol (SYMBICORT) 160-4.5 MCG/ACT inhaler Inhale 2 puffs into the lungs 2 (two) times daily. 1 Inhaler 12  . [DISCONTINUED] calcium citrate-vitamin D (CITRACAL+D) 315-200 MG-UNIT per tablet Take 1 tablet by mouth 2 (two) times daily.       No current facility-administered medications for this visit.    Physical Exam: Filed Vitals:   09/20/14 1331  BP: 120/82  Pulse: 80  Height: 5\' 6"  (1.676 m)  Weight: 66.044 kg (145 lb 9.6 oz)    GEN- The patient is well appearing, alert and oriented x 3 today.   Head- normocephalic, atraumatic Eyes-  Sclera clear, conjunctiva pink Ears- hearing intact Oropharynx- clear Lungs- Clear to ausculation bilaterally, normal work of breathing Heart- Regular rate and rhythm, no murmurs, rubs or gallops, PMI not laterally displaced GI- soft, NT, ND, + BS Extremities- no clubbing, cyanosis, or edema  ekg today reveals sinus rhythm with RBBB Echo/ myoview/ holter are reviewed  Assessment and Plan:  1. afib Well controlled post ablation Stop tikosyn Reduce metoprolol to 25mg  daily Continue eliquis  2. SOB Not due to afib Echo/ myoview/ holter low risk Likely pulmonary in etiology Could consider CPX testing to further evaluate Will defer to Drs Joya Gaskins and Caryl Comes  Return to see me in 3 months Will return EP care to Dr Caryl Comes if in sinus rhythm at that time

## 2014-09-24 ENCOUNTER — Other Ambulatory Visit: Payer: Self-pay

## 2014-09-24 NOTE — Patient Outreach (Signed)
[facility]) Care Management  09/24/2014  Renee [name] [birth year] [id number]  SUBJECTIVE:  Telephone call to patient regarding primary MD referral.  Discussed [facility] care management services. Patient states, "I'm not interested." Patient hung up phone.    ASSESSMENT: Unable to provide patient with [facility] contact information for future use.  PLAN: RNCM will refer to Lurline Del to close due to refusal of services. RNCM will notify patients primary provider, Dr. [name] of refusal of services.   Quinn Plowman RN,BSN,CCM [place] Coordinator [phone]

## 2014-09-29 DIAGNOSIS — E538 Deficiency of other specified B group vitamins: Secondary | ICD-10-CM | POA: Diagnosis not present

## 2014-10-04 NOTE — Patient Outreach (Signed)
[facility]) Care Management  09/24/2014  Renee [name] 10/11/1934 [id number]   Notification from Quinn Plowman, RN to close case due to patient declined [place] Management services.  Thanks, [name]. [place], [place] Assistant Phone: [phone] Fax: [phone]

## 2014-10-05 DIAGNOSIS — I480 Paroxysmal atrial fibrillation: Secondary | ICD-10-CM | POA: Diagnosis not present

## 2014-10-05 DIAGNOSIS — J449 Chronic obstructive pulmonary disease, unspecified: Secondary | ICD-10-CM | POA: Diagnosis not present

## 2014-10-05 DIAGNOSIS — R51 Headache: Secondary | ICD-10-CM | POA: Diagnosis not present

## 2014-10-05 DIAGNOSIS — I10 Essential (primary) hypertension: Secondary | ICD-10-CM | POA: Diagnosis not present

## 2014-10-05 DIAGNOSIS — J029 Acute pharyngitis, unspecified: Secondary | ICD-10-CM | POA: Diagnosis not present

## 2014-10-05 DIAGNOSIS — Z7901 Long term (current) use of anticoagulants: Secondary | ICD-10-CM | POA: Diagnosis not present

## 2014-10-05 DIAGNOSIS — R05 Cough: Secondary | ICD-10-CM | POA: Diagnosis not present

## 2014-10-05 DIAGNOSIS — J309 Allergic rhinitis, unspecified: Secondary | ICD-10-CM | POA: Diagnosis not present

## 2014-10-05 DIAGNOSIS — R042 Hemoptysis: Secondary | ICD-10-CM | POA: Diagnosis not present

## 2014-10-25 DIAGNOSIS — I25810 Atherosclerosis of coronary artery bypass graft(s) without angina pectoris: Secondary | ICD-10-CM | POA: Diagnosis not present

## 2014-10-25 DIAGNOSIS — K1379 Other lesions of oral mucosa: Secondary | ICD-10-CM | POA: Diagnosis not present

## 2014-10-25 DIAGNOSIS — Z1389 Encounter for screening for other disorder: Secondary | ICD-10-CM | POA: Diagnosis not present

## 2014-10-25 DIAGNOSIS — E785 Hyperlipidemia, unspecified: Secondary | ICD-10-CM | POA: Diagnosis not present

## 2014-10-25 DIAGNOSIS — I10 Essential (primary) hypertension: Secondary | ICD-10-CM | POA: Diagnosis not present

## 2014-10-25 DIAGNOSIS — R7301 Impaired fasting glucose: Secondary | ICD-10-CM | POA: Diagnosis not present

## 2014-10-25 DIAGNOSIS — I252 Old myocardial infarction: Secondary | ICD-10-CM | POA: Diagnosis not present

## 2014-10-25 DIAGNOSIS — Z7901 Long term (current) use of anticoagulants: Secondary | ICD-10-CM | POA: Diagnosis not present

## 2014-10-25 DIAGNOSIS — J449 Chronic obstructive pulmonary disease, unspecified: Secondary | ICD-10-CM | POA: Diagnosis not present

## 2014-10-27 ENCOUNTER — Telehealth: Payer: Self-pay | Admitting: Critical Care Medicine

## 2014-10-27 NOTE — Telephone Encounter (Signed)
error 

## 2014-11-03 ENCOUNTER — Encounter: Payer: Self-pay | Admitting: Internal Medicine

## 2014-11-03 ENCOUNTER — Ambulatory Visit (INDEPENDENT_AMBULATORY_CARE_PROVIDER_SITE_OTHER): Payer: Medicare Other | Admitting: Internal Medicine

## 2014-11-03 VITALS — BP 130/72 | HR 72 | Ht 66.0 in | Wt 145.2 lb

## 2014-11-03 DIAGNOSIS — K219 Gastro-esophageal reflux disease without esophagitis: Secondary | ICD-10-CM | POA: Diagnosis not present

## 2014-11-03 DIAGNOSIS — I2510 Atherosclerotic heart disease of native coronary artery without angina pectoris: Secondary | ICD-10-CM | POA: Diagnosis not present

## 2014-11-03 NOTE — Patient Instructions (Signed)
Please follow up as needed 

## 2014-11-03 NOTE — Progress Notes (Signed)
HISTORY OF PRESENT ILLNESS:  Renee [name] is a 80 y.o. female with multiple significant medical problems including oxygen-dependent COPD and coronary artery disease with prior coronary artery bypass grafting. The patient scheduled this appointment herself with chief complaint of throat pain, sore right mandible, increased phlegm production, and chronic abdominal burning sensation. She requests upper endoscopy. The patient was evaluated for similar problems earlier this year. Her problems were felt to be non-GI. She is accompanied by her husband. She tells me that she has seen multiple different specialists regarding these complaints. This includes several ENT physicians. Teeth complaint is soreness on the roof of her mouth. She tells me that she saw an oral surgeon who plans to biopsy this area. She does have a history of GERD for which she takes Nexium. No pyrosis or water brash. No dysphagia. Barium esophagram earlier that she was unremarkable. She is on chronic anticoagulation therapy. She wonders if any of the aforementioned symptoms are related to her stomach. In terms of burning sensation, she states she has had this for greater than a decade. Since her last visit she has had no weight loss. She been no evidence for GI bleeding. No lower GI complaints. Last colonoscopy in 2010 revealed minute of adenomas and hemorrhoids but was otherwise normal. She is on chronic oxygen therapy at night and as needed oxygen therapy during the day.  REVIEW OF SYSTEMS:  All non-GI ROS negative except for anxiety, cough, phlegm, sciatica, neck pain, arthritis, knee pain  Past Medical History  Diagnosis Date  . GERD (gastroesophageal reflux disease)   . IBS (irritable bowel syndrome)   . RBBB   . Coronary artery disease     a. CABG 09/2009. b. normal myoview in 05/2012.   [name] Anxiety   . PAF (paroxysmal atrial fibrillation)     a. Post op after CABG. Intolerant of amio in the past. b. Recurrence - started on  Tikosyn 11/2013.  [name] Chronic diastolic CHF (congestive heart failure)   . Dysphagia   . Colon polyps   . Hypertension   . HLD (hyperlipidemia)   . Hypothyroidism   . Chronic bronchitis   . Emphysema/COPD     on home O2  . Sciatic nerve pain   . Orthostatic hypotension   . PVC's (premature ventricular contractions)     a. Holter 11/2013: no AF, no SVT, rare PVC couplet, occasional PACs.  . Premature atrial contractions     a. Holter 11/2013: no AF, no SVT, rare PVC couplet, occasional PACs.  . Chronic respiratory failure   . Bradycardia   . Brain aneurysm   . Oral thrush   . Internal hemorrhoids     Past Surgical History  Procedure Laterality Date  . Total hip arthroplasty Right 11/01/2008  . Appendectomy  1995  . Tonsillectomy and adenoidectomy    . Joint replacement    . Coronary artery bypass graft  09/21/2009    CABG X2  . Cardiac catheterization  1990's; 06/2009  . Cholecystectomy  2008  . Larynx surgery  ~ 1960    "tumor removed"  . Total abdominal hysterectomy  1986  . Tubal ligation  1973  . Cataract extraction w/ intraocular lens  implant, bilateral Bilateral   . Retinal laser procedure Left 10/19/2013    "had a wrinkle in it"  . Tee without cardioversion N/A 05/10/2014    Procedure: TRANSESOPHAGEAL ECHOCARDIOGRAM (TEE);  Surgeon: Sueanne Margarita, MD;  Location: [facility];  Service: Cardiovascular;  Laterality: N/A;  .  Atrial fibrillation ablation N/A 05/11/2014    Procedure: ATRIAL FIBRILLATION ABLATION;  Surgeon: Thompson Grayer, MD;  Location: [facility] CATH LAB;  Service: Cardiovascular;  Laterality: N/A;    Social History Codi MAILYN STEICHEN  reports that she quit smoking about 5 years ago. Her smoking use included Cigarettes. She has a 42 pack-year smoking history. She has never used smokeless tobacco. She reports that she does not drink alcohol or use illicit drugs.  family history includes Brain cancer in her sister; Breast cancer in her sister; Colon cancer in her  daughter and other; Colon polyps in her brother; Deep vein thrombosis in her mother; Diabetes in her father; Heart attack in her father; Heart disease in her mother; Hyperlipidemia in her father; Hypertension in her brother, father, and mother; Myasthenia gravis in her brother; Transient ischemic attack in her mother.  Allergies  Allergen Reactions  . Spiriva [Tiotropium Bromide Monohydrate] Other (See Comments)    Throat irritation  . Statins     Legs hurt  . Levofloxacin Nausea Only    Unable to tolerate more than 7days  . Budesonide-Formoterol Fumarate     Tachycardia  . Morphine     REACTION: double vision  . Penicillins     REACTION: swelling/rash  . Phenazopyridine Hcl     *piridium* REACTION: face red  . Pneumococcal Vaccines Swelling    Redding of the skin  . Prednisone Swelling  . Sulfonamide Derivatives     REACTION: rash  . Tylenol [Acetaminophen]     Tylenol #3 -- rash and swelling - takes plain Tylenol ok Per pt, she took this spring 2015 with no problems  . Penicillins Swelling and Rash  . Sulfa Antibiotics Rash       PHYSICAL EXAMINATION: Vital signs: BP 130/72 mmHg  Pulse 72  Ht 5\' 6"  (1.676 m)  Wt 145 lb 3.2 oz (65.862 kg)  BMI 23.45 kg/m2  Constitutional: Chronically ill-appearing, no acute distress Psychiatric: alert and oriented x3, cooperative. Anxious Eyes: extraocular movements intact, anicteric, conjunctiva pink Mouth: oral pharynx moist, no lesions Neck: supple without thyromegaly. Chest: Midline sternotomy scar well-healed  Lymph: no lymphadenopathy Cardiovascular: heart regular rate and rhythm, no murmur Lungs: clear to auscultation bilaterally with somewhat distant breath sounds Abdomen: soft, nontender, nondistended, no obvious ascites, no peritoneal signs, normal bowel sounds, no organomegaly Rectal: Omitted Extremities: no clubbing cyanosis or lower extremity edema bilaterally Skin: no lesions on visible extremities Neuro: No focal  deficits. Cranial nerves intact.    ASSESSMENT:  #1. Complaints of sore roof of the mouth, jaw pain, and increased phlegm production. I again told her that this is not a GI problem principally. There is no role for upper endoscopy as she has requested. She should continue to work with her primary care provider and not seek out multiple specialists on her own without some direction. #2. Chronic abdominal "burning". Greater than 10 years. Vague. No worrisome features. No further workup recommended #3. GERD. On PPI #4. Multiple significant medical problems   PLAN:  #1. Reflux precautions #2. Continue PPI to control GERD symptoms #3. Return to PCP as outlined above  20 minutes spent with the patient and her husband regarding her multiple complaints and my clinical impressions. The majority of the time spent counseling regarding these issues

## 2014-11-04 ENCOUNTER — Telehealth: Payer: Self-pay | Admitting: Internal Medicine

## 2014-11-04 NOTE — Telephone Encounter (Signed)
Per protocol, okay to hold Eliquis x 24 hours prior to procedure.  Will fax clearance to Dr. Raynelle Dick office.

## 2014-11-04 NOTE — Telephone Encounter (Signed)
New message    Request for surgical clearance:  1. What type of surgery is being performed? Oral surgery  - tissue biopsy   2. When is this surgery scheduled? Pending   3. Are there any medications that need to be held prior to surgery and how long? eliquis    4. Name of physician performing surgery? Dr. Frederik Schmidt   5. What is your office phone and fax number? [phone] /  fax [phone]

## 2014-11-10 DIAGNOSIS — Z1231 Encounter for screening mammogram for malignant neoplasm of breast: Secondary | ICD-10-CM | POA: Diagnosis not present

## 2014-11-10 DIAGNOSIS — Z803 Family history of malignant neoplasm of breast: Secondary | ICD-10-CM | POA: Diagnosis not present

## 2014-11-12 ENCOUNTER — Telehealth: Payer: Self-pay | Admitting: Critical Care Medicine

## 2014-11-12 NOTE — Telephone Encounter (Signed)
Last OV note has been faxed. Nothing further was needed.

## 2014-11-16 DIAGNOSIS — D3709 Neoplasm of uncertain behavior of other specified sites of the oral cavity: Secondary | ICD-10-CM | POA: Diagnosis not present

## 2014-11-23 DIAGNOSIS — K091 Developmental (nonodontogenic) cysts of oral region: Secondary | ICD-10-CM | POA: Diagnosis not present

## 2014-11-29 ENCOUNTER — Telehealth: Payer: Self-pay | Admitting: Internal Medicine

## 2014-11-29 DIAGNOSIS — R042 Hemoptysis: Secondary | ICD-10-CM

## 2014-11-29 NOTE — Telephone Encounter (Signed)
Spoke with pt, c/o prod cough with yellow/green mucus tinged with blood, worsening X2 weeks.  Denies chest pain, fever.   Pt coming in tomorrow at 3:00 to see TP with cxr prior.  Pt aware.  Order placed.  Nothing further needed.

## 2014-11-30 ENCOUNTER — Ambulatory Visit (INDEPENDENT_AMBULATORY_CARE_PROVIDER_SITE_OTHER): Payer: Medicare Other | Admitting: Adult Health

## 2014-11-30 ENCOUNTER — Encounter: Payer: Self-pay | Admitting: Adult Health

## 2014-11-30 ENCOUNTER — Ambulatory Visit (INDEPENDENT_AMBULATORY_CARE_PROVIDER_SITE_OTHER)
Admission: RE | Admit: 2014-11-30 | Discharge: 2014-11-30 | Disposition: A | Discharge status: Home / Self Care | Payer: Medicare Other | Source: Ambulatory Visit | Attending: Adult Health | Admitting: Adult Health

## 2014-11-30 VITALS — BP 128/82 | HR 82 | Temp 98.2°F | Ht 65.0 in | Wt 143.0 lb

## 2014-11-30 DIAGNOSIS — R042 Hemoptysis: Secondary | ICD-10-CM | POA: Diagnosis not present

## 2014-11-30 DIAGNOSIS — J9611 Chronic respiratory failure with hypoxia: Secondary | ICD-10-CM

## 2014-11-30 DIAGNOSIS — I2510 Atherosclerotic heart disease of native coronary artery without angina pectoris: Secondary | ICD-10-CM | POA: Diagnosis not present

## 2014-11-30 DIAGNOSIS — J449 Chronic obstructive pulmonary disease, unspecified: Secondary | ICD-10-CM | POA: Diagnosis not present

## 2014-11-30 NOTE — Patient Instructions (Addendum)
Finish Cipro as directed.  May try Claritin 5mg  At bedtime  As needed  Drainage .  Saline nasal rinses Twice daily  As needed  Nasal congestion  Mucinex DM Twice daily  As needed  Cough/congestion.  Continue on Oxygen with activity as needed and At bedtime   Follow up Dr. Chase Caller as planned next month and As needed   Please contact office for sooner follow up if symptoms do not improve or worsen or seek emergency care  Order to change oxygen company to [facility]

## 2014-11-30 NOTE — Assessment & Plan Note (Signed)
Wants new DME cause POC does not work well.  Continue on Oxygen with activity as needed and At bedtime   Follow up Dr. Chase Caller as planned next month and As needed   Please contact office for sooner follow up if symptoms do not improve or worsen or seek emergency care  Order to change oxygen company to [facility]

## 2014-11-30 NOTE — Assessment & Plan Note (Signed)
COPD flare with URI/AR  Multiple drug intolerances .  If improved on return, consider spirometry or PFT  Plan   Finish Cipro as directed.  May try Claritin 5mg  At bedtime  As needed  Drainage .  Saline nasal rinses Twice daily  As needed  Nasal congestion  Mucinex DM Twice daily  As needed  Cough/congestion.  Continue on Oxygen with activity as needed and At bedtime   Follow up Dr. Chase Caller as planned next month and As needed   Please contact office for sooner follow up if symptoms do not improve or worsen or seek emergency care  Order to change oxygen company to [facility].

## 2014-11-30 NOTE — Progress Notes (Signed)
   Subjective:    Patient ID: Renee [name], female    DOB: Apr 18, 1934, 80 y.o.   MRN: [id number]  HPI 80 yo female with GOLD C COPD and Chronic Resp Failure on O2    11/30/2014 Acute OV Pt presents for an acute office visit.  She complains of few weeks of worsening cough with thick mucus .  Complains of prod cough with green/yellow colored mucus, sinus drainage, SOB with activity, and chest congestion. Denies any fever, nausea or vomitting.pt complains that she has increased saliva in mouth. No jaw pain or swelling.  She does have some nasal drainage. We discussed using antihistamine however says she can not want to take this.   denies fever, chest pain, orthopnea, edema or fever.   Dx with UTI by PCP , just started on Cipro today.   Has tried several inhalers in past with multiple drug intolerances. Currently on xopenex inhaler only . No controller for some time.  She has no recent spirometry or PFT .     Review of Systems Constitutional:   No  weight loss, night sweats,  Fevers, chills, + fatigue, or  lassitude.  HEENT:   No headaches,  Difficulty swallowing,  Tooth/dental problems, or  Sore throat,                No sneezing, itching, ear ache, nasal congestion, post nasal drip,   CV:  No chest pain,  Orthopnea, PND, swelling in lower extremities, anasarca, dizziness, palpitations, syncope.   GI  No heartburn, indigestion, abdominal pain, nausea, vomiting, diarrhea, change in bowel habits, loss of appetite, bloody stools.   Resp: .  No chest wall deformity  Skin: no rash or lesions.  GU: no dysuria, change in color of urine, no urgency or frequency.  No flank pain, no hematuria   MS:  No joint pain or swelling.  No decreased range of motion.  No back pain.  Psych:  No change in mood or affect. No depression or anxiety.  No memory loss.         Objective:   Physical Exam GEN: A/Ox3; pleasant , NAD,eldelry   HEENT:  [place]/AT,  EACs-clear, TMs-wnl, NOSE-clear,  THROAT-clear, no lesions, no postnasal drip or exudate noted.   NECK:  Supple w/ fair ROM; no JVD; normal carotid impulses w/o bruits; no thyromegaly or nodules palpated; no lymphadenopathy.  RESP  Decreased BS in bases , no accessory muscle use, no dullness to percussion  CARD:  RRR, no m/r/g  , no peripheral edema, pulses intact, no cyanosis or clubbing.  GI:   Soft & nt; nml bowel sounds; no organomegaly or masses detected.  Musco: Warm bil, no deformities or joint swelling noted.   Neuro: alert, no focal deficits noted.    Skin: Warm, no lesions or rashes         Assessment & Plan:

## 2014-12-01 ENCOUNTER — Telehealth: Payer: Self-pay | Admitting: Adult Health

## 2014-12-01 NOTE — Telephone Encounter (Signed)
Spoke with Melissa. Pt needs qualifying walk since she is switching DMEs. Spoke with pt. She is going to come in on Monday to have this done. Melissa is aware of this. Nothing further was needed.

## 2014-12-06 ENCOUNTER — Ambulatory Visit: Payer: Medicare Other | Admitting: Internal Medicine

## 2014-12-06 DIAGNOSIS — J418 Mixed simple and mucopurulent chronic bronchitis: Secondary | ICD-10-CM

## 2014-12-13 ENCOUNTER — Encounter: Payer: Self-pay | Admitting: Internal Medicine

## 2014-12-13 ENCOUNTER — Ambulatory Visit (INDEPENDENT_AMBULATORY_CARE_PROVIDER_SITE_OTHER): Payer: Medicare Other | Admitting: Internal Medicine

## 2014-12-13 DIAGNOSIS — R0989 Other specified symptoms and signs involving the circulatory and respiratory systems: Secondary | ICD-10-CM | POA: Diagnosis not present

## 2014-12-13 DIAGNOSIS — J449 Chronic obstructive pulmonary disease, unspecified: Secondary | ICD-10-CM | POA: Diagnosis not present

## 2014-12-13 DIAGNOSIS — J392 Other diseases of pharynx: Secondary | ICD-10-CM | POA: Diagnosis not present

## 2014-12-13 DIAGNOSIS — J9611 Chronic respiratory failure with hypoxia: Secondary | ICD-10-CM | POA: Diagnosis not present

## 2014-12-13 DIAGNOSIS — J4489 Other specified chronic obstructive pulmonary disease: Secondary | ICD-10-CM | POA: Insufficient documentation

## 2014-12-13 DIAGNOSIS — I2510 Atherosclerotic heart disease of native coronary artery without angina pectoris: Secondary | ICD-10-CM

## 2014-12-13 MED ORDER — DOXYCYCLINE HYCLATE 100 MG PO TABS: ORAL_TABLET | ORAL | Status: DC

## 2014-12-13 MED ORDER — TIOTROPIUM BROMIDE MONOHYDRATE 2.5 MCG/ACT IN AERS: 2.0000 | INHALATION_SPRAY | Freq: Every day | RESPIRATORY_TRACT | Status: DC

## 2014-12-13 MED ORDER — PREDNISONE 10 MG PO TABS: ORAL_TABLET | ORAL | Status: DC

## 2014-12-13 NOTE — Progress Notes (Signed)
Subjective:     Patient ID: Renee [name], female   DOB: [birth year], 80 y.o.   MRN: [id number]  HPI   OV 12/13/2014  Chief Complaint  Patient presents with  . Follow-up    Former PW pt. Pt saw TP on 10.25.16 as an acute visit. Pt states she feels no improvement since OV. Pt c/o prod cough with green mucus, increase SOB, chest congestion, PND. Pt c/o hemoptysis intermittenly since Jan. 2016. Pt states the mucus contains blood and discolored mucus    follow-up patient of Dr. Asencion Noble with COPD FEV1 64% in March 2014. CT scan chest in 2009 showing emphysema and low-dose CT scan chest May 2016 without any lung cancer.  She presents with her husband. At baseline she uses oxygen with exertion. I notice that she's not on any inhale maintenance drugs for her COPD. She tells me that since January 2016 he has had waxing and waning throat irritation that got particularly worse in April after she had a transesophageal echocardiogram and ablation. Apparently at this time she had some bleeding in her mouth. After that she is seen ENT evaluation. She is even been taken off her inhalers. She has an allergy listed to Spiriva as throat irritation. In addition chart review shows that she was also taken off Tudorza and none of these measures have helped her throat irritation. She has cough associated with this.  In support of this she says the last few weeks the genital irritation from her throat is worse associated with green mucus general shortness of breath and chest congestion. There is also some occasional hemoptysis with the same.  She wants a lighter oxygen tank but when she walked her 185 feet 3 laps on room air: She was able to walk only 2 laps and stopped due to fatigue and shortness of breath but did not desaturate.   Note: I personally  visualize  CT images     has a past medical history of GERD (gastroesophageal reflux disease); IBS (irritable bowel syndrome); RBBB; Coronary artery disease;  Anxiety; PAF (paroxysmal atrial fibrillation) ([place]); Chronic diastolic CHF (congestive heart failure) ([place]); Dysphagia; Colon polyps; Hypertension; HLD (hyperlipidemia); Hypothyroidism; Chronic bronchitis ([place]); Emphysema/COPD ([place]); Sciatic nerve pain; Orthostatic hypotension; PVC's (premature ventricular contractions); Premature atrial contractions; Chronic respiratory failure ([place]); Bradycardia; Brain aneurysm; Oral thrush; and Internal hemorrhoids.   reports that she quit smoking about 5 years ago. Her smoking use included Cigarettes. She has a 42 pack-year smoking history. She has never used smokeless tobacco.  Past Surgical History  Procedure Laterality Date  . Total hip arthroplasty Right 11/01/2008  . Appendectomy  1995  . Tonsillectomy and adenoidectomy    . Joint replacement    . Coronary artery bypass graft  09/21/2009    CABG X2  . Cardiac catheterization  1990's; 06/2009  . Cholecystectomy  2008  . Larynx surgery  ~ 1960    "tumor removed"  . Total abdominal hysterectomy  1986  . Tubal ligation  1973  . Cataract extraction w/ intraocular lens  implant, bilateral Bilateral   . Retinal laser procedure Left 10/19/2013    "had a wrinkle in it"  . Tee without cardioversion N/A 05/10/2014    Procedure: TRANSESOPHAGEAL ECHOCARDIOGRAM (TEE);  Surgeon: Sueanne Margarita, MD;  Location[facility] ENDOSCOPY;  Service: Cardiovascular;  Laterality: N/A;  . Atrial fibrillation ablation N/A 05/11/2014    Procedure: ATRIAL FIBRILLATION ABLATION;  Surgeon: Thompson Grayer, MD;  Location: [facility] CATH LAB;  Service: Cardiovascular;  Laterality:  N/A;    Allergies  Allergen Reactions  . Spiriva [Tiotropium Bromide Monohydrate] Other (See Comments)    Throat irritation  . Statins     Legs hurt  . Levofloxacin Nausea Only    Unable to tolerate more than 7days  . Budesonide-Formoterol Fumarate     Tachycardia  . Morphine     REACTION: double vision  . Penicillins     REACTION: swelling/rash  . Phenazopyridine  Hcl     *piridium* REACTION: face red  . Pneumococcal Vaccines Swelling    [place] of the skin  . Prednisone Swelling  . Sulfonamide Derivatives     REACTION: rash  . Tylenol [Acetaminophen]     Tylenol #3 -- rash and swelling - takes plain Tylenol ok Per pt, she took this spring 2015 with no problems  . Penicillins Swelling and Rash  . Sulfa Antibiotics Rash    Immunization History  Administered Date(s) Administered  . Influenza Split 10/08/2011  . Influenza Whole 10/06/2009, 12/07/2010  . Influenza,inj,Quad PF,36+ Mos 11/13/2012  . Influenza-Unspecified 11/13/2013  . Pneumococcal Polysaccharide-23 02/06/2007    Family History  Problem Relation Age of Onset  . Heart disease Mother   . Transient ischemic attack Mother   . Hypertension Mother   . Deep vein thrombosis Mother   . Heart attack Father   . Hypertension Father   . Diabetes Father   . Hyperlipidemia Father   . Breast cancer Sister   . Myasthenia gravis Brother   . Brain cancer Sister     mets from breast  . Colon polyps Brother   . Hypertension Brother   . Colon cancer Daughter   . Colon cancer Other     nephew     Current outpatient prescriptions:  .  ALPRAZolam (XANAX) 0.25 MG tablet, Take 0.25 mg by mouth 2 (two) times daily as needed for anxiety or sleep., Disp: , Rfl:  .  apixaban (ELIQUIS) 5 MG TABS tablet, Take 1 tablet (5 mg total) by mouth 2 (two) times daily., Disp: 60 tablet, Rfl: 1 .  esomeprazole (NEXIUM) 40 MG capsule, Take 40 mg by mouth daily before breakfast.  , Disp: , Rfl:  .  furosemide (LASIX) 40 MG tablet, Take 40 mg by mouth daily., Disp: , Rfl:  .  levalbuterol (XOPENEX HFA) 45 MCG/ACT inhaler, Inhale 2 puffs into the lungs every 6 (six) hours as needed for wheezing., Disp: , Rfl:  .  levothyroxine (SYNTHROID, LEVOTHROID) 25 MCG tablet, Take 25 mcg by mouth daily before breakfast. , Disp: , Rfl:  .  metoprolol succinate (TOPROL-XL) 25 MG 24 hr tablet, Take 1 tablet (25 mg total) by  mouth daily., Disp: 30 tablet, Rfl: 6 .  nitroGLYCERIN (NITROSTAT) 0.4 MG SL tablet, Place 0.4 mg under the tongue every 5 (five) minutes as needed for chest pain., Disp: , Rfl:  .  Polyethyl Glycol-Propyl Glycol (SYSTANE OP), Place 1 drop into both eyes 2 (two) times daily. , Disp: , Rfl:  .  potassium chloride SA (K-DUR,KLOR-CON) 20 MEQ tablet, Take 20 mEq by mouth 2 (two) times daily., Disp: , Rfl:  .  traMADol (ULTRAM) 50 MG tablet, Take 1 tablet (50 mg total) by mouth every 6 (six) hours as needed. Pain, Disp: 30 tablet, Rfl: 0 .  cholecalciferol (VITAMIN D) 1000 UNITS tablet, Take 1,000 Units by mouth daily., Disp: , Rfl:  .  [DISCONTINUED] budesonide-formoterol (SYMBICORT) 160-4.5 MCG/ACT inhaler, Inhale 2 puffs into the lungs 2 (two) times daily., Disp: 1  Inhaler, Rfl: 12 .  [DISCONTINUED] calcium citrate-vitamin D (CITRACAL+D) 315-200 MG-UNIT per tablet, Take 1 tablet by mouth 2 (two) times daily.  , Disp: , Rfl:     Review of Systems This is per history of present illness    Objective:   Physical Exam  Constitutional: She is oriented to person, place, and time. She appears well-developed and well-nourished. No distress.  Looks somewhat deconditioned  HENT:  Head: Normocephalic and atraumatic.  Right Ear: External ear normal.  Left Ear: External ear normal.  Mouth/Throat: Oropharynx is clear and moist. No oropharyngeal exudate.  Eyes: Conjunctivae and EOM are normal. Pupils are equal, round, and reactive to light. Right eye exhibits no discharge. Left eye exhibits no discharge. No scleral icterus.  Neck: Normal range of motion. Neck supple. No JVD present. No tracheal deviation present. No thyromegaly present.  Cardiovascular: Normal rate, regular rhythm, normal heart sounds and intact distal pulses.  Exam reveals no gallop and no friction rub.   No murmur heard. Pulmonary/Chest: Effort normal. No respiratory distress. She has no wheezes. She has rales. She exhibits no tenderness.   Bilateral bibasal crackles present  Abdominal: Soft. Bowel sounds are normal. She exhibits no distension and no mass. There is no tenderness. There is no rebound and no guarding.  Musculoskeletal: Normal range of motion. She exhibits no edema or tenderness.  Has a walker  Lymphadenopathy:    She has no cervical adenopathy.  Neurological: She is alert and oriented to person, place, and time. She has normal reflexes. No cranial nerve deficit. She exhibits normal muscle tone. Coordination normal.  Skin: Skin is warm and dry. No rash noted. She is not diaphoretic. No erythema. No pallor.  Psychiatric: She has a normal mood and affect. Her behavior is normal. Judgment and thought content normal.  Vitals reviewed.   Filed Vitals:   12/13/14 1133  BP: 132/80  Pulse: 88  Height: 5\' 5"  (1.651 m)  Weight: 142 lb 9.6 oz (64.683 kg)  SpO2: 96%        Assessment:       ICD-9-CM ICD-10-CM   1. COPD with chronic bronchitis (HCC) 491.20 J44.9 Pulmonary Function Test  2. Bibasilar crackles 786.7 R09.89 CT Chest High Resolution  3. Irritated throat 478.29 J39.2        Plan:         I do not have a good handle on this lady's symptoms. I suspect she is having some amount of irritable larynx syndrome or vocal cord dysfunction. Unclear what the ENT evaluation with Dr. Redmond Baseman showed in the past. For now I will treat her as a COPD exacerbation. Nevertheless because of Bibasal crackles will get pulmonary function test and high resolution CT chest rule out interstitial lung disease.. If there is no interstitial lung disease then we will review her ENT notes and possibly consider getting a CT neck and if all this is fine we'll have to refer her to speech therapy for irritable larynx and possibly consider gabapentin  She certainly has over perception in her symptoms. With walking she did not desaturate and therefore she does not qualify for home oxygen  TRy Take doxycycline 100mg  po twice daily x 5  days; take after meals and avoid sunlight Please take prednisone 40 mg x1 day, then 30 mg x1 day, then 20 mg x1 day, then 10 mg x1 day, and then 5 mg x1 day and stop Can try spiriva respimat 1 puff daily - to see if still effective  against copd without causing throat irritation You  will not qualify for home oxygen with exertion because of lack of desaturation  Do HRCT wo contrast - prone and supine Do full PFT  Followup  - return to see me or my NP after above   > 50% of this > 25 min visit spent in face to face counseling or coordination of care    Dr. Brand Males, M.D., [facility].C.P Pulmonary and Critical Care Medicine Staff Physician [place] [facility] Pager: [phone], If no answer or between  15:00h - 7:00h: call [phone]  12/13/2014 5:41 PM

## 2014-12-13 NOTE — Patient Instructions (Addendum)
ICD-9-CM ICD-10-CM   1. COPD with chronic bronchitis (HCC) 491.20 J44.9 Pulmonary Function Test  2. Bibasilar crackles 786.7 R09.89 CT Chest High Resolution  3. Irritated throat 478.29 J39.2    TRy Take doxycycline 100mg  po twice daily x 5 days; take after meals and avoid sunlight Please take prednisone 40 mg x1 day, then 30 mg x1 day, then 20 mg x1 day, then 10 mg x1 day, and then 5 mg x1 day and stop Can try spiriva respimat 1 puff daily - to see if still effective against copd without causing throat irritation You  will not qualify for home oxygen with exertion because of lack of desaturation  Do HRCT wo contrast - prone and supine Do full PFT  Followup  - return to see me or my NP after above

## 2014-12-14 ENCOUNTER — Telehealth: Payer: Self-pay | Admitting: Internal Medicine

## 2014-12-14 DIAGNOSIS — J9611 Chronic respiratory failure with hypoxia: Secondary | ICD-10-CM

## 2014-12-14 DIAGNOSIS — J418 Mixed simple and mucopurulent chronic bronchitis: Secondary | ICD-10-CM

## 2014-12-14 NOTE — Telephone Encounter (Signed)
Received a call from [place] at [facility].  She said that she received a call from patient requesting that they pick up her O2 tanks.  She advised Yvetta Coder that she does not need her tanks anymore, that she only needs the Concentrator for night time only.  Yvetta Coder calling to see if we can send an order to D/C tanks and she is wanting to know if Dr. Chase Caller would like to do an ONO on patient to determine if she needs O2 at night?    MR - please advise.

## 2014-12-15 NOTE — Telephone Encounter (Signed)
Spoke with pt. Aware we will d/c daytime O2. Order placed for ONO on RA. Nothing further needed

## 2014-12-15 NOTE — Telephone Encounter (Signed)
Dc amb o2 system  But   Yes do ONo on RA at night

## 2014-12-21 ENCOUNTER — Ambulatory Visit (INDEPENDENT_AMBULATORY_CARE_PROVIDER_SITE_OTHER)
Admission: RE | Admit: 2014-12-21 | Discharge: 2014-12-21 | Disposition: A | Discharge status: Home / Self Care | Payer: Medicare Other | Source: Ambulatory Visit | Attending: Internal Medicine | Admitting: Internal Medicine

## 2014-12-21 DIAGNOSIS — R079 Chest pain, unspecified: Secondary | ICD-10-CM | POA: Diagnosis not present

## 2014-12-21 DIAGNOSIS — J9611 Chronic respiratory failure with hypoxia: Secondary | ICD-10-CM | POA: Diagnosis not present

## 2014-12-21 DIAGNOSIS — R0989 Other specified symptoms and signs involving the circulatory and respiratory systems: Secondary | ICD-10-CM

## 2014-12-21 DIAGNOSIS — R0602 Shortness of breath: Secondary | ICD-10-CM | POA: Diagnosis not present

## 2014-12-22 ENCOUNTER — Encounter: Payer: Self-pay | Admitting: Internal Medicine

## 2014-12-22 ENCOUNTER — Ambulatory Visit (INDEPENDENT_AMBULATORY_CARE_PROVIDER_SITE_OTHER): Payer: Medicare Other | Admitting: Internal Medicine

## 2014-12-22 VITALS — BP 80/60 | HR 73 | Ht 65.5 in | Wt 142.6 lb

## 2014-12-22 DIAGNOSIS — I480 Paroxysmal atrial fibrillation: Secondary | ICD-10-CM

## 2014-12-22 DIAGNOSIS — I2510 Atherosclerotic heart disease of native coronary artery without angina pectoris: Secondary | ICD-10-CM

## 2014-12-22 DIAGNOSIS — R42 Dizziness and giddiness: Secondary | ICD-10-CM | POA: Diagnosis not present

## 2014-12-22 DIAGNOSIS — J9611 Chronic respiratory failure with hypoxia: Secondary | ICD-10-CM | POA: Diagnosis not present

## 2014-12-22 DIAGNOSIS — I4891 Unspecified atrial fibrillation: Secondary | ICD-10-CM | POA: Diagnosis not present

## 2014-12-22 LAB — CBC WITH DIFFERENTIAL/PLATELET
Basophils Absolute: 0.1 10*3/uL (ref 0.0–0.1)
Basophils Relative: 1 % (ref 0–1)
EOS ABS: 0.3 10*3/uL (ref 0.0–0.7)
Eosinophils Relative: 3 % (ref 0–5)
HCT: 42.8 % (ref 36.0–46.0)
Hemoglobin: 13.9 g/dL (ref 12.0–15.0)
Lymphocytes Relative: 35 % (ref 12–46)
Lymphs Abs: 3.5 10*3/uL (ref 0.7–4.0)
MCH: 28 pg (ref 26.0–34.0)
MCHC: 32.5 g/dL (ref 30.0–36.0)
MCV: 86.1 fL (ref 78.0–100.0)
MONO ABS: 0.9 10*3/uL (ref 0.1–1.0)
MONOS PCT: 9 % (ref 3–12)
MPV: 9.9 fL (ref 8.6–12.4)
NEUTROS PCT: 52 % (ref 43–77)
Neutro Abs: 5.3 10*3/uL (ref 1.7–7.7)
PLATELETS: 400 10*3/uL (ref 150–400)
RBC: 4.97 MIL/uL (ref 3.87–5.11)
RDW: 14.6 % (ref 11.5–15.5)
WBC: 10.1 10*3/uL (ref 4.0–10.5)

## 2014-12-22 LAB — BASIC METABOLIC PANEL
BUN: 16 mg/dL (ref 7–25)
CO2: 26 mmol/L (ref 20–31)
CREATININE: 1.19 mg/dL — AB (ref 0.60–0.93)
Calcium: 9.9 mg/dL (ref 8.6–10.4)
Chloride: 99 mmol/L (ref 98–110)
GLUCOSE: 95 mg/dL (ref 65–99)
Potassium: 4.3 mmol/L (ref 3.5–5.3)
Sodium: 138 mmol/L (ref 135–146)

## 2014-12-22 NOTE — Patient Instructions (Signed)
Medication Instructions:  Your physician has recommended you make the following change in your medication:  1) Decrease Toprol to 25 mg daily 2) Decrease Furosemide to 20 mg daily   Labwork: Your physician recommends that you return for lab work today: BMP/CBC   Testing/Procedures: None ordered   Follow-Up: Your physician recommends that you schedule a follow-up appointment in: 3 months with Dr Caryl Comes   Any Other Special Instructions Will Be Listed Below (If Applicable).     If you need a refill on your cardiac medications before your next appointment, please call your pharmacy.

## 2014-12-22 NOTE — Progress Notes (Signed)
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 female who presents today for routine electrophysiology followup.  Since her ablation, the patient reports doing well.  She has had no symptoms of AF.  Her SOB is unchanged.  She has recently had low BP and dizziness.  Today, she denies symptoms of palpitations, chest pain,  lower extremity edema, presyncope, or syncope.  The patient is otherwise without complaint today.   Past Medical History  Diagnosis Date  . GERD (gastroesophageal reflux disease)   . IBS (irritable bowel syndrome)   . RBBB   . Coronary artery disease     a. CABG 09/2009. b. normal myoview in 05/2012.   [name] Anxiety   . PAF (paroxysmal atrial fibrillation) ([place])     a. Post op after CABG. Intolerant of amio in the past. b. Recurrence - started on Tikosyn 11/2013.  [name] Chronic diastolic CHF (congestive heart failure) ([place])   . Dysphagia   . Colon polyps   . Hypertension   . HLD (hyperlipidemia)   . Hypothyroidism   . Chronic bronchitis ([place])   . Emphysema/COPD ([place])     on home O2  . Sciatic nerve pain   . Orthostatic hypotension   . PVC's (premature ventricular contractions)     a. Holter 11/2013: no AF, no SVT, rare PVC couplet, occasional PACs.  . Premature atrial contractions     a. Holter 11/2013: no AF, no SVT, rare PVC couplet, occasional PACs.  . Chronic respiratory failure ([place])   . Bradycardia   . Brain aneurysm   . Oral thrush   . Internal hemorrhoids    Past Surgical History  Procedure Laterality Date  . Total hip arthroplasty Right 11/01/2008  . Appendectomy  1995  . Tonsillectomy and adenoidectomy    . Joint replacement    . Coronary artery bypass graft  09/21/2009    CABG X2  . Cardiac catheterization  1990's; 06/2009  . Cholecystectomy  2008  . Larynx surgery  ~ 1960    "tumor removed"  . Total abdominal hysterectomy  1986  . Tubal ligation  1973  . Cataract extraction w/ intraocular lens  implant, bilateral  Bilateral   . Retinal laser procedure Left 10/19/2013    "had a wrinkle in it"  . Tee without cardioversion N/A 05/10/2014    Procedure: TRANSESOPHAGEAL ECHOCARDIOGRAM (TEE);  Surgeon: Sueanne Margarita, MD;  Location: [facility] ENDOSCOPY;  Service: Cardiovascular;  Laterality: N/A;  . Atrial fibrillation ablation N/A 05/11/2014    Procedure: ATRIAL FIBRILLATION ABLATION;  Surgeon: Thompson Grayer, MD;  Location: [facility] CATH LAB;  Service: Cardiovascular;  Laterality: N/A;    ROS- all systems are reviewed and negatives except as per HPI above  Current Outpatient Prescriptions  Medication Sig Dispense Refill  . ALPRAZolam (XANAX) 0.25 MG tablet Take 0.25 mg by mouth 2 (two) times daily as needed for anxiety or sleep.    [name] apixaban (ELIQUIS) 5 MG TABS tablet Take 1 tablet (5 mg total) by mouth 2 (two) times daily. 60 tablet 1  . cholecalciferol (VITAMIN D) 1000 UNITS tablet Take 1,000 Units by mouth daily.    [name] esomeprazole (NEXIUM) 40 MG capsule Take 40 mg by mouth daily before breakfast.      . furosemide (LASIX) 40 MG tablet Take 40 mg by mouth daily.    [name] levalbuterol (XOPENEX HFA) 45 MCG/ACT inhaler Inhale 2 puffs into the lungs every 6 (six) hours as needed  for wheezing.    [name] levothyroxine (SYNTHROID, LEVOTHROID) 25 MCG tablet Take 25 mcg by mouth daily before breakfast.     . metoprolol succinate (TOPROL-XL) 25 MG 24 hr tablet Take 1 tablet (25 mg total) by mouth daily. 30 tablet 6  . nitroGLYCERIN (NITROSTAT) 0.4 MG SL tablet Place 0.4 mg under the tongue every 5 (five) minutes as needed for chest pain.    [name] Glycol-Propyl Glycol (SYSTANE OP) Place 1 drop into both eyes 2 (two) times daily.     . potassium chloride SA (K-DUR,KLOR-CON) 20 MEQ tablet Take 20 mEq by mouth 2 (two) times daily.    . Tiotropium Bromide Monohydrate (SPIRIVA RESPIMAT) 2.5 MCG/ACT AERS Inhale 2 puffs into the lungs daily. 1 Inhaler 5  . traMADol (ULTRAM) 50 MG tablet Take 1 tablet (50 mg total) by mouth every 6 (six) hours  as needed. Pain 30 tablet 0  . [DISCONTINUED] budesonide-formoterol (SYMBICORT) 160-4.5 MCG/ACT inhaler Inhale 2 puffs into the lungs 2 (two) times daily. 1 Inhaler 12  . [DISCONTINUED] calcium citrate-vitamin D (CITRACAL+D) 315-200 MG-UNIT per tablet Take 1 tablet by mouth 2 (two) times daily.       No current facility-administered medications for this visit.    Physical Exam: Filed Vitals:   12/22/14 1113  BP: 80/60  Pulse: 73  Height: 5' 5.5" (1.664 m)  Weight: 142 lb 9.6 oz (64.683 kg)  SpO2: 99%    GEN- The patient is well appearing, alert and oriented x 3 today.   Head- normocephalic, atraumatic Eyes-  Sclera clear, conjunctiva pink Ears- hearing intact Oropharynx- clear, OP is dry Lungs- Clear to ausculation bilaterally, normal work of breathing Heart- Regular rate and rhythm, no murmurs, rubs or gallops, PMI not laterally displaced GI- soft, NT, ND, + BS Extremities- no clubbing, cyanosis, or edema Skin turgor is poor  ekg today reveals sinus rhythm with RBBB Pulm clinic notes are reviewed  Assessment and Plan:  1. afib Well controlled post ablation off of AAD therapy Reduce metoprolol to 25mg  daily (she did not do this as instructed last visit) Continue eliquis  2. SOB Not due to afib Likely pulmonary in etiology Will defer to  Pulm  3. Hypotension Appears dry on exam Reduce lasix to 20mg  daily Bmet, cbc today Adequate hydration encouarged  Follow-up with Dr Caryl Comes in 87months I will see when needed going forward  Thompson Grayer MD, [facility] 12/22/2014 11:45 AM

## 2014-12-28 ENCOUNTER — Telehealth: Payer: Self-pay | Admitting: Internal Medicine

## 2014-12-28 NOTE — Telephone Encounter (Signed)
I spoke with patient about results and she verbalized understanding and had no questions 

## 2014-12-28 NOTE — Telephone Encounter (Signed)
LEt Renee [name] know that CT 12/21/14 does not show nodules, cancer, pneumonia, Pulmonary fibrosis - so all good news. Only shows emphysema but we knew that and some old scar tissue stable since 2009. Will see her after PFT in feb 2017

## 2015-01-03 ENCOUNTER — Encounter: Payer: Self-pay | Admitting: Internal Medicine

## 2015-01-06 ENCOUNTER — Telehealth: Payer: Self-pay | Admitting: Internal Medicine

## 2015-01-06 NOTE — Telephone Encounter (Signed)
Called and spoke to pt. Informed pt of the results and recs per MR. Pt verbalized understanding and denied any further questions or concerns at this time.  

## 2015-01-06 NOTE — Telephone Encounter (Signed)
ONO 12/16/14 - desats lowest 67%, Times </= 88% at 13 minutes. She will qualify for 1L O2 at night. She can do it if sshe wants given mixed data on benefit. She can try it and see if it helps her feel better in the morning

## 2015-01-25 DIAGNOSIS — R8299 Other abnormal findings in urine: Secondary | ICD-10-CM | POA: Diagnosis not present

## 2015-01-25 DIAGNOSIS — Z23 Encounter for immunization: Secondary | ICD-10-CM | POA: Diagnosis not present

## 2015-01-25 DIAGNOSIS — E538 Deficiency of other specified B group vitamins: Secondary | ICD-10-CM | POA: Diagnosis not present

## 2015-01-25 DIAGNOSIS — N390 Urinary tract infection, site not specified: Secondary | ICD-10-CM | POA: Diagnosis not present

## 2015-02-11 ENCOUNTER — Encounter: Payer: Self-pay | Admitting: Internal Medicine

## 2015-02-23 DIAGNOSIS — I480 Paroxysmal atrial fibrillation: Secondary | ICD-10-CM | POA: Diagnosis not present

## 2015-02-23 DIAGNOSIS — I252 Old myocardial infarction: Secondary | ICD-10-CM | POA: Diagnosis not present

## 2015-02-23 DIAGNOSIS — R7301 Impaired fasting glucose: Secondary | ICD-10-CM | POA: Diagnosis not present

## 2015-02-23 DIAGNOSIS — K1379 Other lesions of oral mucosa: Secondary | ICD-10-CM | POA: Diagnosis not present

## 2015-02-23 DIAGNOSIS — I10 Essential (primary) hypertension: Secondary | ICD-10-CM | POA: Diagnosis not present

## 2015-02-23 DIAGNOSIS — E784 Other hyperlipidemia: Secondary | ICD-10-CM | POA: Diagnosis not present

## 2015-02-23 DIAGNOSIS — J449 Chronic obstructive pulmonary disease, unspecified: Secondary | ICD-10-CM | POA: Diagnosis not present

## 2015-02-23 DIAGNOSIS — I25810 Atherosclerosis of coronary artery bypass graft(s) without angina pectoris: Secondary | ICD-10-CM | POA: Diagnosis not present

## 2015-03-11 NOTE — Progress Notes (Signed)
xxx  Dr. Brand Males, M.D., [facility].C.P Pulmonary and Critical Care Medicine Staff Physician [place] [facility] Pager: [phone], If no answer or between  15:00h - 7:00h: call [phone]  03/11/2015 12:29 AM

## 2015-03-16 ENCOUNTER — Ambulatory Visit (INDEPENDENT_AMBULATORY_CARE_PROVIDER_SITE_OTHER): Payer: Medicare Other | Admitting: Internal Medicine

## 2015-03-16 ENCOUNTER — Encounter: Payer: Self-pay | Admitting: Internal Medicine

## 2015-03-16 VITALS — BP 142/84 | HR 84 | Ht 66.0 in | Wt 141.0 lb

## 2015-03-16 DIAGNOSIS — J449 Chronic obstructive pulmonary disease, unspecified: Secondary | ICD-10-CM | POA: Diagnosis not present

## 2015-03-16 LAB — PULMONARY FUNCTION TEST
DL/VA % pred: 70 %
DL/VA: 3.57 ml/min/mmHg/L
DLCO UNC % PRED: 42 %
DLCO unc: 11.55 ml/min/mmHg
FEF 25-75 PRE: 1.22 L/s
FEF 25-75 Post: 1.74 L/sec
FEF2575-%CHANGE-POST: 42 %
FEF2575-%Pred-Post: 108 %
FEF2575-%Pred-Pre: 76 %
FEV1-%CHANGE-POST: 5 %
FEV1-%PRED-POST: 73 %
FEV1-%Pred-Pre: 70 %
FEV1-POST: 1.61 L
FEV1-Pre: 1.54 L
FEV1FVC-%Change-Post: 2 %
FEV1FVC-%Pred-Pre: 105 %
FEV6-%Change-Post: 2 %
FEV6-%PRED-POST: 72 %
FEV6-%Pred-Pre: 70 %
FEV6-PRE: 1.96 L
FEV6-Post: 2.01 L
FEV6FVC-%Pred-Post: 105 %
FEV6FVC-%Pred-Pre: 105 %
FVC-%CHANGE-POST: 2 %
FVC-%PRED-POST: 68 %
FVC-%Pred-Pre: 67 %
FVC-Post: 2.01 L
FVC-Pre: 1.96 L
POST FEV1/FVC RATIO: 81 %
PRE FEV6/FVC RATIO: 100 %
Post FEV6/FVC ratio: 100 %
Pre FEV1/FVC ratio: 78 %
RV % PRED: 78 %
RV: 1.93 L
TLC % pred: 73 %
TLC: 3.92 L

## 2015-03-16 MED ORDER — TIOTROPIUM BROMIDE-OLODATEROL 2.5-2.5 MCG/ACT IN AERS: 2.0000 | INHALATION_SPRAY | Freq: Every day | RESPIRATORY_TRACT | Status: DC

## 2015-03-16 MED ORDER — BECLOMETHASONE DIPROPIONATE 80 MCG/ACT IN AERS: 2.0000 | INHALATION_SPRAY | Freq: Two times a day (BID) | RESPIRATORY_TRACT | Status: DC

## 2015-03-16 NOTE — Progress Notes (Signed)
PFT done today. 

## 2015-03-16 NOTE — Patient Instructions (Signed)
ICD-9-CM ICD-10-CM   1. Moderate COPD (chronic obstructive pulmonary disease) (HCC) 496 J44.9     - change spiriva mdi to STIOLTO daily  - start QVAR 35mcg 2 puff twice daily  - use albuterol 2 puff as needed - refer pulmonary rehab  - rehab will asesss you for fitness esp with your heart problems and insurance before making you participage  followup  2-3 months with me or NP or sooner if needed

## 2015-03-16 NOTE — Progress Notes (Signed)
Subjective:     Patient ID: Renee [name], female   DOB: 1934-06-23, 81 y.o.   MRN: [id number]  HPI  OV 12/13/2014  Chief Complaint  Patient presents with  . Follow-up    Former PW pt. Pt saw TP on 10.25.16 as an acute visit. Pt states she feels no improvement since OV. Pt c/o prod cough with green mucus, increase SOB, chest congestion, PND. Pt c/o hemoptysis intermittenly since Jan. 2016. Pt states the mucus contains blood and discolored mucus    follow-up patient of Dr. Asencion Noble with COPD FEV1 64% in March 2014. CT scan chest in 2009 showing emphysema and low-dose CT scan chest May 2016 without any lung cancer.  She presents with her husband. At baseline she uses oxygen with exertion. I notice that she's not on any inhale maintenance drugs for her COPD. She tells me that since January 2016 he has had waxing and waning throat irritation that got particularly worse in April after she had a transesophageal echocardiogram and ablation. Apparently at this time she had some bleeding in her mouth. After that she is seen ENT evaluation. She is even been taken off her inhalers. She has an allergy listed to Spiriva as throat irritation. In addition chart review shows that she was also taken off Tudorza and none of these measures have helped her throat irritation. She has cough associated with this.  In support of this she says the last few weeks the genital irritation from her throat is worse associated with green mucus general shortness of breath and chest congestion. There is also some occasional hemoptysis with the same.  She wants a lighter oxygen tank but when she walked her 185 feet 3 laps on room air: She was able to walk only 2 laps and stopped due to fatigue and shortness of breath but did not desaturate.   Note: I personally  visualize  CT images    OV 03/16/2015  Chief Complaint  Patient presents with  . Follow-up    Pt here after HRCT and PFT. Pt states her breathing is  unchanged since last OV.  Pt states the spiriva respimat makes her cough more. Pt c/o prod cough with thick green mucus. Pt denies CP/tightness and f/c/s.     Follow-up moderate COPD with over perception of symptoms. Last seen November 2016. At that time I subjected her to the following evaluation listed below. It appears that only problem is COPD/emphysema. Gold stage II. In the interim she's developed some atrial fibrillation. She is known to have coronary artery disease. She says this is being taken care of by her cardiology team. She says that her bigger issues this throat irritation from Spiriva powder. She wants to try other inhalers. She last attended pulmonary rehabilitation 5 years ago after cardiac surgery. She is open to attending it again.   Full pulmonary function test February 2017 shows FEV1 1.61 L/73% post broncho-dilator response which is only 5% and a ratio of 81. Total lung capacity is 3.9 L/73%. DLCO 7.5/42%. Pulmonary function test suggests restriction. High-resolution CT chest 12/21/2014 paradoxically does not show any interstitial lung disease. Consider shows only emphysema. She does have coronary artery calcification. She did have overnight oxygen test November 2016 and she did  desaturate. However she tried nocturnal oxygen but it did not help her. She does not want to use oxygen   Current outpatient prescriptions:  .  ALPRAZolam (XANAX) 0.25 MG tablet, Take 0.25 mg by mouth 2 (two) times daily  as needed for anxiety or sleep., Disp: , Rfl:  .  apixaban (ELIQUIS) 5 MG TABS tablet, Take 1 tablet (5 mg total) by mouth 2 (two) times daily., Disp: 60 tablet, Rfl: 1 .  cholecalciferol (VITAMIN D) 1000 UNITS tablet, Take 1,000 Units by mouth daily., Disp: , Rfl:  .  esomeprazole (NEXIUM) 40 MG capsule, Take 40 mg by mouth daily before breakfast.  , Disp: , Rfl:  .  furosemide (LASIX) 40 MG tablet, Take 20 mg by mouth daily., Disp: , Rfl:  .  levalbuterol (XOPENEX HFA) 45 MCG/ACT  inhaler, Inhale 2 puffs into the lungs every 6 (six) hours as needed for wheezing., Disp: , Rfl:  .  levothyroxine (SYNTHROID, LEVOTHROID) 25 MCG tablet, Take 25 mcg by mouth daily before breakfast. , Disp: , Rfl:  .  metoprolol succinate (TOPROL-XL) 25 MG 24 hr tablet, Take 1 tablet (25 mg total) by mouth daily., Disp: 30 tablet, Rfl: 6 .  nitroGLYCERIN (NITROSTAT) 0.4 MG SL tablet, Place 0.4 mg under the tongue every 5 (five) minutes as needed for chest pain., Disp: , Rfl:  .  Polyethyl Glycol-Propyl Glycol (SYSTANE OP), Place 1 drop into both eyes 2 (two) times daily. , Disp: , Rfl:  .  potassium chloride SA (K-DUR,KLOR-CON) 20 MEQ tablet, Take 20 mEq by mouth 2 (two) times daily., Disp: , Rfl:  .  Tiotropium Bromide Monohydrate (SPIRIVA RESPIMAT) 2.5 MCG/ACT AERS, Inhale 2 puffs into the lungs daily., Disp: 1 Inhaler, Rfl: 5 .  traMADol (ULTRAM) 50 MG tablet, Take 1 tablet (50 mg total) by mouth every 6 (six) hours as needed. Pain, Disp: 30 tablet, Rfl: 0 .  [DISCONTINUED] budesonide-formoterol (SYMBICORT) 160-4.5 MCG/ACT inhaler, Inhale 2 puffs into the lungs 2 (two) times daily., Disp: 1 Inhaler, Rfl: 12 .  [DISCONTINUED] calcium citrate-vitamin D (CITRACAL+D) 315-200 MG-UNIT per tablet, Take 1 tablet by mouth 2 (two) times daily.  , Disp: , Rfl:   Allergies  Allergen Reactions  . Spiriva [Tiotropium Bromide Monohydrate] Other (See Comments)    Throat irritation  . Statins     Legs hurt  . Levofloxacin Nausea Only    Unable to tolerate more than 7days  . Budesonide-Formoterol Fumarate Other (See Comments)    Tachycardia  . Morphine Other (See Comments)     REACTION: double vision  . Penicillins Rash    REACTION: swelling/rash  . Penicillins Swelling and Rash  . Phenazopyridine Hcl Other (See Comments)    *piridium* REACTION: face red  . Pneumococcal Vaccines Swelling    Redding of the skin  . Prednisone Swelling  . Sulfa Antibiotics Rash  . Sulfonamide Derivatives Rash     REACTION: rash  . Tylenol [Acetaminophen] Rash    Tylenol #3 -- rash and swelling - takes plain Tylenol ok Per pt, she took this spring 2015 with no problems    Immunization History  Administered Date(s) Administered  . Influenza Split 10/08/2011  . Influenza Whole 10/06/2009, 12/07/2010  . Influenza,inj,Quad PF,36+ Mos 11/13/2012, 12/07/2014  . Influenza-Unspecified 11/13/2013  . Pneumococcal Polysaccharide-23 02/06/2007      Review of Systems According to history of present illness    Objective:   Physical Exam  Constitutional: She is oriented to person, place, and time. She appears well-developed and well-nourished. No distress.  Frail lady  HENT:  Head: Normocephalic and atraumatic.  Right Ear: External ear normal.  Left Ear: External ear normal.  Mouth/Throat: Oropharynx is clear and moist. No oropharyngeal exudate.  Eyes: Conjunctivae and EOM  are normal. Pupils are equal, round, and reactive to light. Right eye exhibits no discharge. Left eye exhibits no discharge. No scleral icterus.  Neck: Normal range of motion. Neck supple. No JVD present. No tracheal deviation present. No thyromegaly present.  Cardiovascular: Normal rate, regular rhythm, normal heart sounds and intact distal pulses.  Exam reveals no gallop and no friction rub.   No murmur heard. Pulmonary/Chest: Effort normal and breath sounds normal. No respiratory distress. She has no wheezes. She has no rales. She exhibits no tenderness.  Central scar of bypass surgery present  Abdominal: Soft. Bowel sounds are normal. She exhibits no distension and no mass. There is no tenderness. There is no rebound and no guarding.  Musculoskeletal: Normal range of motion. She exhibits no edema or tenderness.  Lymphadenopathy:    She has no cervical adenopathy.  Neurological: She is alert and oriented to person, place, and time. She has normal reflexes. No cranial nerve deficit. She exhibits normal muscle tone. Coordination  normal.  Skin: Skin is warm and dry. No rash noted. She is not diaphoretic. No erythema. No pallor.  Psychiatric: She has a normal mood and affect. Her behavior is normal. Judgment and thought content normal.  Vitals reviewed.    Filed Vitals:   03/16/15 1417  BP: 142/84  Pulse: 84  Height: 5\' 6"  (1.676 m)  Weight: 141 lb (63.957 kg)  SpO2: 96%        Assessment:       ICD-9-CM ICD-10-CM   1. Moderate COPD (chronic obstructive pulmonary disease) ([place]) 496 J44.9 Pulmonary rehab therapeutic exercise       Plan:     I think physical deconditioning, based on coronary artery disease and a moderate emphysema/COPD a contributing to her symptoms. In addition Spiriva MDI probably contributing to throat irritation. She might need triple inhaler therapy. Therefore we will start her on stiolto and Qvar and referred to pulmonary rehabilitation. I'm hopeful that this will help improve her symptoms. She has cardiac issues and prior to pulmonary rehabilitation she will need some sort for cardiac clearance I suspect that this can be evaluated by pulmonary rehabilitation.  She and husband are agreeable with the plan   Dr. Brand Males, M.D., [facility].C.P Pulmonary and Critical Care Medicine Staff Physician [facility] Pager: [phone], If no answer or between  15:00h - 7:00h: call [phone]  03/16/2015 2:48 PM

## 2015-03-24 ENCOUNTER — Ambulatory Visit (INDEPENDENT_AMBULATORY_CARE_PROVIDER_SITE_OTHER): Payer: Medicare Other | Admitting: Internal Medicine

## 2015-03-24 ENCOUNTER — Encounter: Payer: Self-pay | Admitting: Internal Medicine

## 2015-03-24 VITALS — BP 120/84 | HR 70 | Ht 66.0 in | Wt 139.0 lb

## 2015-03-24 DIAGNOSIS — I480 Paroxysmal atrial fibrillation: Secondary | ICD-10-CM | POA: Diagnosis not present

## 2015-03-24 NOTE — Progress Notes (Signed)
Patient Care Team: [name], MD as PCP - General (Internal Medicine) Elsie Stain, MD (Pulmonary Disease) Peter M Martinique, MD (Cardiology)   HPI  Renee [name] is a 81 y.o. female w hx of  atrial fibrillation that dates back a number of years possibly to around the time of her bypass surgery in 2011.  nd this creates a great deal of anxiety.  Recording demonstrated episodes of very rapid atrial fibrillation with rates up to 190s. Cardizem was added to metoprolol; up titration was complicated by significant edema.  She poorly tolerated antiarrhythmic drugs. She ultimately underwent catheter ablation 4/16 and when last seen by Dr. Greggory Brandy 11/16 was doing well.  Post ablation complaints of palpitations 5/16 were evaluated with a Holter monitor demonstrates PACs and PVCs  She continues to have complaints of "atrial fibrillation again in the morning when she sits up. It resolves when she sits down. This is accompanied by significant lightheadedness.   Echocardiogram 5/16 demonstrated near-normal LV function. Myoview 212/15 had been normal   Chronic dyspnea has been ascribed pulmonary disease.     Past Medical History  Diagnosis Date  . GERD (gastroesophageal reflux disease)   . IBS (irritable bowel syndrome)   . RBBB   . Coronary artery disease     a. CABG 09/2009. b. normal myoview in 05/2012.   [name] Anxiety   . PAF (paroxysmal atrial fibrillation) ([place])     a. Post op after CABG. Intolerant of amio in the past. b. Recurrence - started on Tikosyn 11/2013.  [name] Chronic diastolic CHF (congestive heart failure) ([place])   . Dysphagia   . Colon polyps   . Hypertension   . HLD (hyperlipidemia)   . Hypothyroidism   . Chronic bronchitis ([place])   . Emphysema/COPD ([place])     on home O2  . Sciatic nerve pain   . Orthostatic hypotension   . PVC's (premature ventricular contractions)     a. Holter 11/2013: no AF, no SVT, rare PVC couplet, occasional PACs.  . Premature atrial  contractions     a. Holter 11/2013: no AF, no SVT, rare PVC couplet, occasional PACs.  . Chronic respiratory failure ([place])   . Bradycardia   . Brain aneurysm   . Oral thrush   . Internal hemorrhoids     Past Surgical History  Procedure Laterality Date  . Total hip arthroplasty Right 11/01/2008  . Appendectomy  1995  . Tonsillectomy and adenoidectomy    . Joint replacement    . Coronary artery bypass graft  09/21/2009    CABG X2  . Cardiac catheterization  1990's; 06/2009  . Cholecystectomy  2008  . Larynx surgery  ~ 1960    "tumor removed"  . Total abdominal hysterectomy  1986  . Tubal ligation  1973  . Cataract extraction w/ intraocular lens  implant, bilateral Bilateral   . Retinal laser procedure Left 10/19/2013    "had a wrinkle in it"  . Tee without cardioversion N/A 05/10/2014    Procedure: TRANSESOPHAGEAL ECHOCARDIOGRAM (TEE);  Surgeon: Sueanne Margarita, MD;  Location: [facility] ENDOSCOPY;  Service: Cardiovascular;  Laterality: N/A;  . Atrial fibrillation ablation N/A 05/11/2014    Procedure: ATRIAL FIBRILLATION ABLATION;  Surgeon: Thompson Grayer, MD;  Location: [facility] CATH LAB;  Service: Cardiovascular;  Laterality: N/A;    Current Outpatient Prescriptions  Medication Sig Dispense Refill  . ALPRAZolam (XANAX) 0.25 MG tablet Take 0.25 mg by mouth 2 (two) times daily as needed for  anxiety or sleep.    [name] apixaban (ELIQUIS) 5 MG TABS tablet Take 1 tablet (5 mg total) by mouth 2 (two) times daily. 60 tablet 1  . beclomethasone (QVAR) 80 MCG/ACT inhaler Inhale 2 puffs into the lungs 2 (two) times daily. 1 Inhaler 6  . cholecalciferol (VITAMIN D) 1000 UNITS tablet Take 1,000 Units by mouth daily.    [name] esomeprazole (NEXIUM) 40 MG capsule Take 40 mg by mouth daily before breakfast.      . furosemide (LASIX) 40 MG tablet Take 20 mg by mouth daily.    [name] levalbuterol (XOPENEX HFA) 45 MCG/ACT inhaler Inhale 2 puffs into the lungs every 6 (six) hours as needed for wheezing.    [name] levothyroxine (SYNTHROID,  LEVOTHROID) 25 MCG tablet Take 25 mcg by mouth daily before breakfast.     . metoprolol succinate (TOPROL-XL) 25 MG 24 hr tablet Take 1 tablet (25 mg total) by mouth daily. 30 tablet 6  . nitroGLYCERIN (NITROSTAT) 0.4 MG SL tablet Place 0.4 mg under the tongue every 5 (five) minutes as needed for chest pain.    [name] Glycol-Propyl Glycol (SYSTANE OP) Place 1 drop into both eyes 2 (two) times daily.     . potassium chloride SA (K-DUR,KLOR-CON) 20 MEQ tablet Take 20 mEq by mouth 2 (two) times daily.    . Tiotropium Bromide Monohydrate (SPIRIVA RESPIMAT) 2.5 MCG/ACT AERS Inhale 2 puffs into the lungs daily. 1 Inhaler 5  . Tiotropium Bromide-Olodaterol (STIOLTO RESPIMAT) 2.5-2.5 MCG/ACT AERS Inhale 2 puffs into the lungs daily. 1 Inhaler 5  . traMADol (ULTRAM) 50 MG tablet Take 1 tablet (50 mg total) by mouth every 6 (six) hours as needed. Pain 30 tablet 0  . [DISCONTINUED] budesonide-formoterol (SYMBICORT) 160-4.5 MCG/ACT inhaler Inhale 2 puffs into the lungs 2 (two) times daily. 1 Inhaler 12  . [DISCONTINUED] calcium citrate-vitamin D (CITRACAL+D) 315-200 MG-UNIT per tablet Take 1 tablet by mouth 2 (two) times daily.       No current facility-administered medications for this visit.    Allergies  Allergen Reactions  . Spiriva [Tiotropium Bromide Monohydrate] Other (See Comments)    Throat irritation  . Statins     Legs hurt  . Levofloxacin Nausea Only    Unable to tolerate more than 7days  . Budesonide-Formoterol Fumarate Other (See Comments)    Tachycardia  . Morphine Other (See Comments)     REACTION: double vision  . Penicillins Rash    REACTION: swelling/rash  . Penicillins Swelling and Rash  . Phenazopyridine Hcl Other (See Comments)    *piridium* REACTION: face red  . Pneumococcal Vaccines Swelling    Redding of the skin  . Prednisone Swelling  . Sulfa Antibiotics Rash  . Sulfonamide Derivatives Rash    REACTION: rash  . Tylenol [Acetaminophen] Rash    Tylenol #3 --  rash and swelling - takes plain Tylenol ok Per pt, she took this spring 2015 with no problems    Review of Systems negative except from HPI and PMH  Physical Exam BP 120/84 mmHg  Pulse 70  Ht 5\' 6"  (1.676 m)  Wt 139 lb (63.05 kg)  BMI 22.45 kg/m2 Well developed and well nourished in no acute distress HENT normal E scleral and icterus clear Neck Supple JVP flat; carotids brisk and full Clear to ausculatio\ Regular rate and rhythm, no murmurs gallops or rub Soft with active bowel sounds No clubbing cyanosis none Edema Alert and oriented, grossly normal motor and sensory function Skin Warm and  Dry   ECG demonstrates sinus rhythm at 70 Intervals 14/11/41 Axis XLIV Right bundle branch block Assessment and  Plan  Atrial fibrillation-paroxysmal s/p RFCA 4/16  Right bundle branch block-incomplete  Coronary disease with prior bypass  Orthostatic hypotension      No evidence of interval atrial fibrillation.  Her tachypalpitations are most consistent with orthostatic tachycardia which is demonstrated today. We have discussed the physiology.  We have recommended an abdominal binder, raising the head of her bed and being aware of the positional changes and the need to allow things to celebrate.  We will discontinue her diuretic; she will use it only as needed. We will also discontinue her potassium supplementation although we will need to followed up as she has a history of hypokalemia. We will check a metabolic profile in 4 weeks.  More than 50% of 45 min was spent in counseling related to the above

## 2015-03-24 NOTE — Patient Instructions (Signed)
Medication Instructions: 1) Stop Lasix (furosemide) 2) Stop potassium 3) Stop Toprol (Metoprolol succinate)  Labwork: - Your physician recommends that you return for lab work in: 4 weeks- BMP  Procedures/Testing: - none  Follow-Up: - Your physician recommends that you schedule a follow-up appointment in: 3 months with Joseph Art, PA for Dr. Caryl Comes (on a day Dr. Caryl Comes is in the office)  Any Additional Special Instructions Will Be Listed Below (If Applicable). - Raise the head of the bed 4 inches - please wear an abdominal binder   If you need a refill on your cardiac medications before your next appointment, please call your pharmacy.

## 2015-03-28 ENCOUNTER — Telehealth: Payer: Self-pay | Admitting: Internal Medicine

## 2015-03-28 NOTE — Addendum Note (Signed)
Addended by: Alvis Lemmings C on: 03/28/2015 05:34 PM   Modules accepted: Orders

## 2015-03-28 NOTE — Telephone Encounter (Signed)
New message      Pt c/o BP issue: STAT if pt c/o blurred vision, one-sided weakness or slurred speech  1. What are your last 5 BP readings? 178/115 HR 96, 188/112 HR 128  2. Are you having any other symptoms (ex. Dizziness, headache, blurred vision, passed out)? dizzy 3. What is your BP issue? Pt was seen recently and was taken off of her bp medication.  Bp started going up Saturday.  Pt took a metoprolol 25mg  on sat 11pm and her bp went down.  Please advise

## 2015-03-28 NOTE — Telephone Encounter (Signed)
I spoke with the patient. She was seen in the office on Thursday 2/16. Furosemide, potassium, and metoprolol were all stopped at that visit. Per the patient, she did fine on Friday, but on Saturday her BP started to go up to the 170-180/ 120 (high end of diastolic) HR- 0000000 was highest.  She did take a metoprolol succ 25 mg on Saturday night about 11 pm and again on Sunday about 11 pm. Her BP was fine during the day on Sunday per her report, but did start to trend up on Sunday night. She has done the same today with her BP.  She took this while on the phone with me and she was 152/85 HR- 81.  Reviewed with Dr. Caryl Comes- stay off lasix/ potassium, and restart metoprolol succ 25 mg daily. The patient is aware and agreeable. I have advised her to call if she has more problems with her BP/ HR control.

## 2015-03-29 ENCOUNTER — Telehealth (HOSPITAL_COMMUNITY): Payer: Self-pay | Admitting: *Deleted

## 2015-03-31 DIAGNOSIS — J449 Chronic obstructive pulmonary disease, unspecified: Secondary | ICD-10-CM | POA: Diagnosis not present

## 2015-03-31 DIAGNOSIS — E538 Deficiency of other specified B group vitamins: Secondary | ICD-10-CM | POA: Diagnosis not present

## 2015-03-31 DIAGNOSIS — R7301 Impaired fasting glucose: Secondary | ICD-10-CM | POA: Diagnosis not present

## 2015-03-31 DIAGNOSIS — I10 Essential (primary) hypertension: Secondary | ICD-10-CM | POA: Diagnosis not present

## 2015-04-01 DIAGNOSIS — G521 Disorders of glossopharyngeal nerve: Secondary | ICD-10-CM | POA: Diagnosis not present

## 2015-04-01 DIAGNOSIS — J387 Other diseases of larynx: Secondary | ICD-10-CM | POA: Diagnosis not present

## 2015-04-01 DIAGNOSIS — R490 Dysphonia: Secondary | ICD-10-CM | POA: Diagnosis not present

## 2015-04-04 ENCOUNTER — Other Ambulatory Visit: Payer: Self-pay | Admitting: Otolaryngology

## 2015-04-04 DIAGNOSIS — J324 Chronic pansinusitis: Secondary | ICD-10-CM

## 2015-04-04 DIAGNOSIS — G521 Disorders of glossopharyngeal nerve: Secondary | ICD-10-CM

## 2015-04-14 ENCOUNTER — Ambulatory Visit
Admission: RE | Admit: 2015-04-14 | Discharge: 2015-04-14 | Disposition: A | Discharge status: Home / Self Care | Payer: Medicare Other | Source: Ambulatory Visit | Attending: Otolaryngology | Admitting: Otolaryngology

## 2015-04-14 DIAGNOSIS — J324 Chronic pansinusitis: Secondary | ICD-10-CM

## 2015-04-14 DIAGNOSIS — G521 Disorders of glossopharyngeal nerve: Secondary | ICD-10-CM

## 2015-04-14 MED ORDER — GADOBENATE DIMEGLUMINE 529 MG/ML IV SOLN
13.0000 mL | Freq: Once | INTRAVENOUS | Status: AC | PRN
  Administered 2015-04-14: 13 mL via INTRAVENOUS

## 2015-04-21 ENCOUNTER — Other Ambulatory Visit: Payer: Medicare Other

## 2015-04-25 ENCOUNTER — Other Ambulatory Visit (INDEPENDENT_AMBULATORY_CARE_PROVIDER_SITE_OTHER): Payer: Medicare Other | Admitting: *Deleted

## 2015-04-25 DIAGNOSIS — I480 Paroxysmal atrial fibrillation: Secondary | ICD-10-CM

## 2015-04-25 DIAGNOSIS — I4891 Unspecified atrial fibrillation: Secondary | ICD-10-CM | POA: Diagnosis not present

## 2015-04-25 LAB — BASIC METABOLIC PANEL
BUN: 10 mg/dL (ref 7–25)
CO2: 27 mmol/L (ref 20–31)
Calcium: 8.8 mg/dL (ref 8.6–10.4)
Chloride: 102 mmol/L (ref 98–110)
Creat: 0.96 mg/dL — ABNORMAL HIGH (ref 0.60–0.93)
Glucose, Bld: 79 mg/dL (ref 65–99)
POTASSIUM: 4.1 mmol/L (ref 3.5–5.3)
Sodium: 140 mmol/L (ref 135–146)

## 2015-05-03 DIAGNOSIS — J019 Acute sinusitis, unspecified: Secondary | ICD-10-CM | POA: Diagnosis not present

## 2015-05-04 ENCOUNTER — Other Ambulatory Visit: Payer: Self-pay | Admitting: Nurse Practitioner

## 2015-05-10 DIAGNOSIS — J029 Acute pharyngitis, unspecified: Secondary | ICD-10-CM | POA: Diagnosis not present

## 2015-05-10 DIAGNOSIS — K219 Gastro-esophageal reflux disease without esophagitis: Secondary | ICD-10-CM | POA: Diagnosis not present

## 2015-05-13 ENCOUNTER — Other Ambulatory Visit: Payer: Self-pay | Admitting: Otolaryngology

## 2015-05-13 DIAGNOSIS — J029 Acute pharyngitis, unspecified: Secondary | ICD-10-CM

## 2015-05-20 ENCOUNTER — Ambulatory Visit
Admission: RE | Admit: 2015-05-20 | Discharge: 2015-05-20 | Disposition: A | Discharge status: Home / Self Care | Payer: Medicare Other | Source: Ambulatory Visit | Attending: Otolaryngology | Admitting: Otolaryngology

## 2015-05-20 DIAGNOSIS — J029 Acute pharyngitis, unspecified: Secondary | ICD-10-CM | POA: Diagnosis not present

## 2015-05-20 MED ORDER — IOPAMIDOL (ISOVUE-300) INJECTION 61%
75.0000 mL | Freq: Once | INTRAVENOUS | Status: AC | PRN
Start: 2015-05-20 — End: 2015-05-20
  Administered 2015-05-20: 75 mL via INTRAVENOUS

## 2015-05-26 ENCOUNTER — Ambulatory Visit: Payer: Medicare Other | Admitting: Internal Medicine

## 2015-05-30 ENCOUNTER — Telehealth: Payer: Self-pay | Admitting: Internal Medicine

## 2015-05-30 DIAGNOSIS — R070 Pain in throat: Secondary | ICD-10-CM | POA: Diagnosis not present

## 2015-05-30 DIAGNOSIS — D380 Neoplasm of uncertain behavior of larynx: Secondary | ICD-10-CM | POA: Diagnosis not present

## 2015-05-30 DIAGNOSIS — R1312 Dysphagia, oropharyngeal phase: Secondary | ICD-10-CM | POA: Diagnosis not present

## 2015-05-30 NOTE — Telephone Encounter (Signed)
New message     Renee [name] is calling for sooner appt  Offered MAY 30 same day that Dr.Klein is in office she declined and ants rn to call

## 2015-05-30 NOTE — Telephone Encounter (Signed)
I spoke with Holley Raring at [facility]. 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 Glenda states pt mentioned her BP has been up and down recently, [facility] states Dr Barry Dienes is requesting appt ASAP for surgical clearance.

## 2015-05-30 NOTE — Telephone Encounter (Signed)
Pt has been scheduled to see Dr Caryl Comes 4//25/17 in [place] at 9:15AM. Holley Raring at Dr [facility] office aware and will contact pt with appt time and location.  Holley Raring will fax request for surgical clearance to Dr Caryl Comes at the [facility] [phone].

## 2015-05-31 ENCOUNTER — Encounter: Payer: Self-pay | Admitting: Internal Medicine

## 2015-05-31 ENCOUNTER — Ambulatory Visit (INDEPENDENT_AMBULATORY_CARE_PROVIDER_SITE_OTHER): Payer: Medicare Other | Admitting: Internal Medicine

## 2015-05-31 ENCOUNTER — Other Ambulatory Visit: Payer: Self-pay | Admitting: Otolaryngology

## 2015-05-31 VITALS — BP 114/80 | HR 72 | Ht 66.0 in | Wt 135.0 lb

## 2015-05-31 DIAGNOSIS — I4891 Unspecified atrial fibrillation: Secondary | ICD-10-CM

## 2015-05-31 NOTE — Progress Notes (Signed)
Patient Care Team: [name], MD as PCP - General (Internal Medicine) Elsie Stain, MD (Pulmonary Disease) Peter M Martinique, MD (Cardiology)   HPI  Renee [name] is a 81 y.o. female w hx of  atrial fibrillation that dates back a number of years possibly to around the time of her bypass surgery in 2011.     Recording demonstrated episodes of very rapid atrial fibrillation with rates up to 190s. Cardizem was added to metoprolol; up titration was complicated by significant edema.  She poorly tolerated antiarrhythmic drugs. She ultimately underwent catheter ablation 4/16 and when last seen by Dr. Greggory Brandy 11/16 was doing well.  Post ablation complaints of palpitations 5/16 were evaluated with a Holter monitor demonstrates PACs and PVCs  She continues to have complaints of "atrial fibrillation"  again in the morning when she sits up. It resolves when she sits down. This is accompanied by significant lightheadedness.  On eval, she was found to be orthostatic;  This has been better with the discontinuation of her diuretics.  She is not wearing an abd binder  She is markedly limited 2/2 COPD and balance issues   She is here today for surgical clearance. She has a mass on her epiglottis.Bx scheduled forFRi   Echocardiogram 5/16 demonstrated near-normal LV function. Myoview 212/15 had been normal   Chronic dyspnea has been ascribed pulmonary disease.     Past Medical History  Diagnosis Date  . GERD (gastroesophageal reflux disease)   . IBS (irritable bowel syndrome)   . RBBB   . Coronary artery disease     a. CABG 09/2009. b. normal myoview in 05/2012.   [name] Anxiety   . PAF (paroxysmal atrial fibrillation) ([place])     a. Post op after CABG. Intolerant of amio in the past. b. Recurrence - started on Tikosyn 11/2013.  [name] Chronic diastolic CHF (congestive heart failure) ([place])   . Dysphagia   . Colon polyps   . Hypertension   . HLD (hyperlipidemia)   . Hypothyroidism   . Chronic  bronchitis ([place])   . Emphysema/COPD ([place])     on home O2  . Sciatic nerve pain   . Orthostatic hypotension   . PVC's (premature ventricular contractions)     a. Holter 11/2013: no AF, no SVT, rare PVC couplet, occasional PACs.  . Premature atrial contractions     a. Holter 11/2013: no AF, no SVT, rare PVC couplet, occasional PACs.  . Chronic respiratory failure ([place])   . Bradycardia   . Brain aneurysm   . Oral thrush   . Internal hemorrhoids     Past Surgical History  Procedure Laterality Date  . Total hip arthroplasty Right 11/01/2008  . Appendectomy  1995  . Tonsillectomy and adenoidectomy    . Joint replacement    . Coronary artery bypass graft  09/21/2009    CABG X2  . Cardiac catheterization  1990's; 06/2009  . Cholecystectomy  2008  . Larynx surgery  ~ 1960    "tumor removed"  . Total abdominal hysterectomy  1986  . Tubal ligation  1973  . Cataract extraction w/ intraocular lens  implant, bilateral Bilateral   . Retinal laser procedure Left 10/19/2013    "had a wrinkle in it"  . Tee without cardioversion N/A 05/10/2014    Procedure: TRANSESOPHAGEAL ECHOCARDIOGRAM (TEE);  Surgeon: Sueanne Margarita, MD;  Location: [facility] ENDOSCOPY;  Service: Cardiovascular;  Laterality: N/A;  . Atrial fibrillation ablation N/A 05/11/2014  Procedure: ATRIAL FIBRILLATION ABLATION;  Surgeon: Thompson Grayer, MD;  Location: [facility] CATH LAB;  Service: Cardiovascular;  Laterality: N/A;    Current Outpatient Prescriptions  Medication Sig Dispense Refill  . ALPRAZolam (XANAX) 0.25 MG tablet Take 0.25 mg by mouth 2 (two) times daily as needed for anxiety or sleep.    [name] apixaban (ELIQUIS) 5 MG TABS tablet Take 1 tablet (5 mg total) by mouth 2 (two) times daily. 60 tablet 1  . beclomethasone (QVAR) 80 MCG/ACT inhaler Inhale 2 puffs into the lungs 2 (two) times daily. 1 Inhaler 6  . cholecalciferol (VITAMIN D) 1000 UNITS tablet Take 1,000 Units by mouth daily.    [name] esomeprazole (NEXIUM) 40 MG capsule Take 40 mg by  mouth daily before breakfast.      . levalbuterol (XOPENEX HFA) 45 MCG/ACT inhaler Inhale 2 puffs into the lungs every 6 (six) hours as needed for wheezing.    [name] levothyroxine (SYNTHROID, LEVOTHROID) 25 MCG tablet Take 25 mcg by mouth daily before breakfast.     . metoprolol succinate (TOPROL-XL) 25 MG 24 hr tablet Take 1 tablet (25 mg total) by mouth daily.    . nitroGLYCERIN (NITROSTAT) 0.4 MG SL tablet Place 0.4 mg under the tongue every 5 (five) minutes as needed for chest pain.    [name] Glycol-Propyl Glycol (SYSTANE OP) Place 1 drop into both eyes 2 (two) times daily.     . Tiotropium Bromide Monohydrate (SPIRIVA RESPIMAT) 2.5 MCG/ACT AERS Inhale 2 puffs into the lungs daily. 1 Inhaler 5  . Tiotropium Bromide-Olodaterol (STIOLTO RESPIMAT) 2.5-2.5 MCG/ACT AERS Inhale 2 puffs into the lungs daily. 1 Inhaler 5  . traMADol (ULTRAM) 50 MG tablet Take 1 tablet (50 mg total) by mouth every 6 (six) hours as needed. Pain 30 tablet 0  . [DISCONTINUED] budesonide-formoterol (SYMBICORT) 160-4.5 MCG/ACT inhaler Inhale 2 puffs into the lungs 2 (two) times daily. 1 Inhaler 12  . [DISCONTINUED] calcium citrate-vitamin D (CITRACAL+D) 315-200 MG-UNIT per tablet Take 1 tablet by mouth 2 (two) times daily.       No current facility-administered medications for this visit.    Allergies  Allergen Reactions  . Spiriva [Tiotropium Bromide Monohydrate] Other (See Comments)    Throat irritation  . Statins     Legs hurt  . Levofloxacin Nausea Only    Unable to tolerate more than 7days  . Budesonide-Formoterol Fumarate Other (See Comments)    Tachycardia  . Morphine Other (See Comments)     REACTION: double vision  . Penicillins Rash    REACTION: swelling/rash  . Penicillins Swelling and Rash  . Phenazopyridine Hcl Other (See Comments)    *piridium* REACTION: face red  . Pneumococcal Vaccines Swelling    [place] of the skin  . Prednisone Swelling  . Sulfa Antibiotics Rash  . Sulfonamide  Derivatives Rash    REACTION: rash  . Tylenol [Acetaminophen] Rash    Tylenol #3 -- rash and swelling - takes plain Tylenol ok Per pt, she took this spring 2015 with no problems    Review of Systems negative except from HPI and PMH  Physical Exam BP 114/80 mmHg  Pulse 72  Ht 5\' 6"  (1.676 m)  Wt 135 lb (61.236 kg)  BMI 21.80 kg/m2 Well developed and well nourished in no acute distress HENT normal E scleral and icterus clear Neck Supple JVP flat; carotids brisk and full Clear to ausculation Regular rate and rhythm S2 widely split, no murmurs gallops or rub Soft with active bowel sounds  No clubbing cyanosis no  Edema Alert and oriented, grossly normal motor and sensory function Skin Warm and Dry   ECG demonstrates sinus rhythm at 72 Intervals 14/12/43 Axis 15 Right bundle branch block Assessment and  Plan  Atrial fibrillation-paroxysmal s/p RFCA 4/16  Right bundle branch block-incomplete  Coronary disease with prior bypass  Orthostatic hypotension    COPD  Gait instablility  She is at acceptable risk for her procedure She is frightened taht it is cancer; she has lost her sister to cancer, and her daughter now with colon cancer  We will stop her apixoban Wed PM and can be resumed per Dr [name] when he feels safe

## 2015-05-31 NOTE — Patient Instructions (Signed)
Medication Instructions: - Take you last dose of Eliquis on Wednesday night. You may resume this as soon as the surgeon feels is safe for you to do so.  Labwork: - none  Procedures/Testing: - none  Follow-Up: - Follow up as scheduled with Joseph Art, PA for Dr. Caryl Comes.   Any Additional Special Instructions Will Be Listed Below (If Applicable).     If you need a refill on your cardiac medications before your next appointment, please call your pharmacy.

## 2015-06-02 ENCOUNTER — Encounter (HOSPITAL_COMMUNITY)
Admission: RE | Admit: 2015-06-02 | Discharge: 2015-06-02 | Disposition: A | Discharge status: Home / Self Care | Payer: Medicare Other | Source: Ambulatory Visit | Attending: Otolaryngology | Admitting: Otolaryngology

## 2015-06-02 ENCOUNTER — Encounter (HOSPITAL_COMMUNITY): Payer: Self-pay

## 2015-06-02 DIAGNOSIS — Z96641 Presence of right artificial hip joint: Secondary | ICD-10-CM | POA: Diagnosis not present

## 2015-06-02 DIAGNOSIS — Z87891 Personal history of nicotine dependence: Secondary | ICD-10-CM | POA: Diagnosis not present

## 2015-06-02 DIAGNOSIS — E039 Hypothyroidism, unspecified: Secondary | ICD-10-CM | POA: Diagnosis not present

## 2015-06-02 DIAGNOSIS — Z79899 Other long term (current) drug therapy: Secondary | ICD-10-CM | POA: Diagnosis not present

## 2015-06-02 DIAGNOSIS — I480 Paroxysmal atrial fibrillation: Secondary | ICD-10-CM | POA: Diagnosis not present

## 2015-06-02 DIAGNOSIS — Z880 Allergy status to penicillin: Secondary | ICD-10-CM | POA: Diagnosis not present

## 2015-06-02 DIAGNOSIS — Z7901 Long term (current) use of anticoagulants: Secondary | ICD-10-CM | POA: Diagnosis not present

## 2015-06-02 DIAGNOSIS — I2510 Atherosclerotic heart disease of native coronary artery without angina pectoris: Secondary | ICD-10-CM | POA: Diagnosis not present

## 2015-06-02 DIAGNOSIS — C321 Malignant neoplasm of supraglottis: Secondary | ICD-10-CM | POA: Diagnosis not present

## 2015-06-02 DIAGNOSIS — E785 Hyperlipidemia, unspecified: Secondary | ICD-10-CM | POA: Diagnosis not present

## 2015-06-02 DIAGNOSIS — I5032 Chronic diastolic (congestive) heart failure: Secondary | ICD-10-CM | POA: Diagnosis not present

## 2015-06-02 DIAGNOSIS — F419 Anxiety disorder, unspecified: Secondary | ICD-10-CM | POA: Diagnosis not present

## 2015-06-02 DIAGNOSIS — J439 Emphysema, unspecified: Secondary | ICD-10-CM | POA: Diagnosis not present

## 2015-06-02 DIAGNOSIS — I252 Old myocardial infarction: Secondary | ICD-10-CM | POA: Diagnosis not present

## 2015-06-02 DIAGNOSIS — Z9981 Dependence on supplemental oxygen: Secondary | ICD-10-CM | POA: Diagnosis not present

## 2015-06-02 DIAGNOSIS — Z955 Presence of coronary angioplasty implant and graft: Secondary | ICD-10-CM | POA: Diagnosis not present

## 2015-06-02 DIAGNOSIS — I110 Hypertensive heart disease with heart failure: Secondary | ICD-10-CM | POA: Diagnosis not present

## 2015-06-02 DIAGNOSIS — C329 Malignant neoplasm of larynx, unspecified: Secondary | ICD-10-CM | POA: Diagnosis present

## 2015-06-02 HISTORY — DX: Unspecified macular degeneration: H35.30

## 2015-06-02 HISTORY — DX: Age-related osteoporosis without current pathological fracture: M81.0

## 2015-06-02 HISTORY — DX: Personal history of other infectious and parasitic diseases: Z86.19

## 2015-06-02 HISTORY — DX: Unspecified osteoarthritis, unspecified site: M19.90

## 2015-06-02 HISTORY — DX: Other chronic pain: G89.29

## 2015-06-02 HISTORY — DX: Personal history of colon polyps, unspecified: Z86.0100

## 2015-06-02 HISTORY — DX: Personal history of other specified conditions: Z87.898

## 2015-06-02 HISTORY — DX: Personal history of colonic polyps: Z86.010

## 2015-06-02 HISTORY — DX: Emphysema, unspecified: J43.9

## 2015-06-02 HISTORY — DX: Dorsalgia, unspecified: M54.9

## 2015-06-02 LAB — BASIC METABOLIC PANEL
Anion gap: 12 (ref 5–15)
CHLORIDE: 99 mmol/L — AB (ref 101–111)
CO2: 29 mmol/L (ref 22–32)
Calcium: 8.5 mg/dL — ABNORMAL LOW (ref 8.9–10.3)
Creatinine, Ser: 0.95 mg/dL (ref 0.44–1.00)
GFR calc Af Amer: >60 mL/min (ref >60)
GFR calc non Af Amer: 56 mL/min — ABNORMAL LOW (ref >60)
Glucose, Bld: 105 mg/dL — ABNORMAL HIGH (ref 65–99)
POTASSIUM: 2.7 mmol/L — AB (ref 3.5–5.1)
SODIUM: 140 mmol/L (ref 135–145)

## 2015-06-02 LAB — CBC
HEMATOCRIT: 38.3 % (ref 36.0–46.0)
Hemoglobin: 11.8 g/dL — ABNORMAL LOW (ref 12.0–15.0)
MCH: 26.6 pg (ref 26.0–34.0)
MCHC: 30.8 g/dL (ref 30.0–36.0)
MCV: 86.3 fL (ref 78.0–100.0)
PLATELETS: 462 10*3/uL — AB (ref 150–400)
RBC: 4.44 MIL/uL (ref 3.87–5.11)
RDW: 14.6 % (ref 11.5–15.5)
WBC: 6.7 10*3/uL (ref 4.0–10.5)

## 2015-06-02 NOTE — Progress Notes (Addendum)
Cardiologist is Dr.Klein,clearance in epic from 05-31-15  Medical Md is Dr. [name]  EKG in epic from 05-31-15  CXR in epic from 11-30-14  Echo reports in epic from 2011/2014/2015/2016  Stress test reports in epic from 2002/2006/2014  Heart cath in 2009  Sleep study

## 2015-06-02 NOTE — Progress Notes (Signed)
Nurse called and spoke with Dr. Brigitte Pulse and informed him that patients potassium was 2.7. Dr. Brigitte Pulse stated he would call patient, and call in a prescription for potassium and have patient start it today, and to recheck potassium in the morning. Nurse thanked Dr. Brigitte Pulse for his time. Call ended.

## 2015-06-02 NOTE — Progress Notes (Signed)
Nurse called Dr. Peggyann Juba office and spoke with [facility] and informed her that patients potassium was 2.7. Holley Raring stated she would let Dr. [name] know.

## 2015-06-02 NOTE — Progress Notes (Signed)
Anesthesia Chart Review:  Pt is a 81 year old female scheduled for direct laryngoscopy with biopsy of L epiglottic lesion on 06/03/2015 with Dr. [name].   PCP is Dr. [name]. EP cardiologist is Dr. Caryl Comes. Cardiologist is Dr. Martinique. Pulmonologist is Dr. Brand Males.   PMH includes:  CAD/MI (s/p CABG 2011), PAF (s/p ablation 05/2014), chronic diastolic CHF, RBBB, HTN, hyperlipidemia, chronic respiratory failure, COPD/emphysema, uses home oxygen, brain aneurysm, hypothyroidism, post-op N/V, GERD. Former smoker. BMI 22  Medications include: eliquis, qvar, nexium, levalbuterol, levothyroxine, metoprolol, spiriva. Eliquis stopped 2 days before surgery.   Preoperative labs reviewed.  K 2.7. Results called to Dr. [name]' office and PCP's office. PCP to address today.  Will recheck in the morning (DOS).   EKG 05/31/2015: NSR. RBBB.   Holter monitor 06/18/14: Sinus rhythm with rare PVCs and PACs. No sustained arrhythmias.  Echo 06/18/14:  - Left ventricle: LVEF is approximately 50% with akinesis of the posterior wall. The cavity size was normal. Wall thickness was normal. Doppler parameters are consistent with abnormal left ventricular relaxation (grade 1 diastolic dysfunction). - Mitral valve: There was mild regurgitation. - Impressions: Poor acoustic windows limit study.  Nuclear stress test 02/01/14: Normal stress nuclear study. LV Ejection Fraction: 66%. LV Wall Motion: NL LV Function; NL Wall Motion.  No cardiac cath since prior to CABG.   If K acceptable DOS, I anticipate pt can proceed as scheduled.   Willeen Cass, FNP-BC [facility]/Anesthesiology Phone: [phone] 06/02/2015 3:24 PM

## 2015-06-02 NOTE — Pre-Procedure Instructions (Signed)
Renee [name]  06/02/2015      [id number] - [place] - [place] AT [facility] [address] [place] [address] Phone: [phone] Fax: [phone]  [facility] [place] [address] [place] [address] Phone: [phone] Fax: [phone]    Your procedure is scheduled on Fri, April 28 @ 7:30 AM  Report to [facility] Admitting at 5:30 AM  Call this number if you have problems the morning of surgery:  [phone]   Remember:  Do not eat food or drink liquids after midnight.  Take these medicines the morning of surgery with A SIP OF WATER Alprazolam(Xanax),QVAR(Beclomethasone),Nexium(Esomeprazole),Xopenex(if needed),Synthroid(Levothyroxine),Metoprolol(Toprol),Spiriva,Systane Eye Drops,and Tramadol(Ultram-if needed)               Hold your Eliquis as noted by Dr.Klein. No Goody's,BC's,Aleve,Aspirin,Ibuprofen,Motrin,Fish Oil,or any Herbal Medications.    Do not wear jewelry, make-up or nail polish.  Do not wear lotions, powders, or perfumes.    Do not shave 48 hours prior to surgery.   Do not bring valuables to the hospital.  [facility] is not responsible for any belongings or valuables.  Contacts, dentures or bridgework may not be worn into surgery.  Leave your suitcase in the car.  After surgery it may be brought to your room.  For patients admitted to the hospital, discharge time will be determined by your treatment team.  Patients discharged the day of surgery will not be allowed to drive home.    Special instructions:  [place] - Preparing for Surgery  Before surgery, you can play an important role.  Because skin is not sterile, your skin needs to be as free of germs as possible.  You can reduce the number of germs on you skin by washing with CHG (chlorahexidine gluconate) soap before surgery.  CHG is an antiseptic cleaner which kills  germs and bonds with the skin to continue killing germs even after washing.  Please DO NOT use if you have an allergy to CHG or antibacterial soaps.  If your skin becomes reddened/irritated stop using the CHG and inform your nurse when you arrive at Short Stay.  Do not shave (including legs and underarms) for at least 48 hours prior to the first CHG shower.  You may shave your face.  Please follow these instructions carefully:   1.  Shower with CHG Soap the night before surgery and the                                morning of Surgery.  2.  If you choose to wash your hair, wash your hair first as usual with your       normal shampoo.  3.  After you shampoo, rinse your hair and body thoroughly to remove the                      Shampoo.  4.  Use CHG as you would any other liquid soap.  You can apply chg directly       to the skin and wash gently with scrungie or a clean washcloth.  5.  Apply the CHG Soap to your body ONLY FROM THE NECK DOWN.        Do not use on open wounds or open sores.  Avoid contact with  your eyes,       ears, mouth and genitals (private parts).  Wash genitals (private parts)       with your normal soap.  6.  Wash thoroughly, paying special attention to the area where your surgery        will be performed.  7.  Thoroughly rinse your body with warm water from the neck down.  8.  DO NOT shower/wash with your normal soap after using and rinsing off       the CHG Soap.  9.  Pat yourself dry with a clean towel.            10.  Wear clean pajamas.            11.  Place clean sheets on your bed the night of your first shower and do not        sleep with pets.  Day of Surgery  Do not apply any lotions/deoderants the morning of surgery.  Please wear clean clothes to the hospital/surgery center.    Please read over the following fact sheets that you were given. Pain Booklet and Coughing and Deep Breathing

## 2015-06-02 NOTE — Anesthesia Preprocedure Evaluation (Addendum)
Anesthesia Evaluation  Patient identified by MRN, date of birth, ID band Patient awake  General Assessment Comment:CAD/MI (s/p CABG 2011), PAF (s/p ablation 05/2014), chronic diastolic CHF, RBBB, HTN, hyperlipidemia, chronic respiratory failure, COPD/emphysema, uses home oxygen, brain aneurysm, hypothyroidism, post-op N/V, GERD. Former smoker. BMI 22  Reviewed: Allergy & Precautions, NPO status , Patient's Chart, lab work & pertinent test results, reviewed documented beta blocker date and time   History of Anesthesia Complications (+) history of anesthetic complications  Airway Mallampati: III  TM Distance: >3 FB Neck ROM: Full  Mouth opening: Limited Mouth Opening  Dental  (+) Teeth Intact, Dental Advisory Given, Upper Dentures, Caps   Pulmonary COPD,  COPD inhaler and oxygen dependent, former smoker,    Pulmonary exam normal breath sounds clear to auscultation       Cardiovascular hypertension, Pt. on home beta blockers and Pt. on medications + CAD, + Past MI, + Peripheral Vascular Disease and +CHF  Normal cardiovascular exam+ dysrhythmias (s/p Ablation) Atrial Fibrillation  Rhythm:Regular Rate:Normal  EKG 05/31/2015: NSR. RBBB.   Holter monitor 06/18/14: Sinus rhythm with rare PVCs and PACs. No sustained arrhythmias.  Echo 06/18/14:  - Left ventricle: LVEF is approximately 50% with akinesis of the posterior wall. The cavity size was normal. Wall thickness was normal. Doppler parameters are consistent with abnormal left ventricular relaxation (grade 1 diastolic dysfunction). - Mitral valve: There was mild regurgitation. - Impressions: Poor acoustic windows limit study.  Nuclear stress test 02/01/14: Normal stress nuclear study. LV Ejection Fraction: 66%. LV Wall Motion: NL LV Function; NL Wall Motion.    Neuro/Psych PSYCHIATRIC DISORDERS Anxiety Brain aneurysm  TIA Neuromuscular disease (Sciatica)    GI/Hepatic Neg liver ROS,  GERD  Medicated,  Endo/Other  Hypothyroidism   Renal/GU negative Renal ROS     Musculoskeletal  (+) Arthritis ,   Abdominal   Peds  Hematology   Anesthesia Other Findings Day of surgery medications reviewed with the patient.  Reproductive/Obstetrics                         Anesthesia Physical Anesthesia Plan  ASA: III  Anesthesia Plan: General   Post-op Pain Management:    Induction: Intravenous  Airway Management Planned: Oral ETT and Video Laryngoscope Planned  Additional Equipment:   Intra-op Plan:   Post-operative Plan: Extubation in OR  Informed Consent: I have reviewed the patients History and Physical, chart, labs and discussed the procedure including the risks, benefits and alternatives for the proposed anesthesia with the patient or authorized representative who has indicated his/her understanding and acceptance.   Dental advisory given  Plan Discussed with: CRNA  Anesthesia Plan Comments: (Risks/benefits of general anesthesia discussed with patient including risk of damage to teeth, lips, gum, and tongue, nausea/vomiting, allergic reactions to medications, and the possibility of heart attack, stroke and death.  All patient questions answered.  Patient wishes to proceed.)       Anesthesia Quick Evaluation

## 2015-06-03 ENCOUNTER — Ambulatory Visit (HOSPITAL_COMMUNITY)
Admission: RE | Admit: 2015-06-03 | Discharge: 2015-06-03 | Disposition: A | Discharge status: Home / Self Care | Payer: Medicare Other | Source: Ambulatory Visit | Attending: Otolaryngology | Admitting: Otolaryngology

## 2015-06-03 ENCOUNTER — Ambulatory Visit (HOSPITAL_COMMUNITY): Payer: Medicare Other | Admitting: Anesthesiology

## 2015-06-03 ENCOUNTER — Encounter (HOSPITAL_COMMUNITY)
Admission: RE | Disposition: A | Discharge status: Home / Self Care | Payer: Self-pay | Source: Ambulatory Visit | Attending: Otolaryngology

## 2015-06-03 ENCOUNTER — Encounter (HOSPITAL_COMMUNITY): Payer: Self-pay | Admitting: Certified Registered Nurse Anesthetist

## 2015-06-03 DIAGNOSIS — Z955 Presence of coronary angioplasty implant and graft: Secondary | ICD-10-CM | POA: Diagnosis not present

## 2015-06-03 DIAGNOSIS — E785 Hyperlipidemia, unspecified: Secondary | ICD-10-CM | POA: Insufficient documentation

## 2015-06-03 DIAGNOSIS — F419 Anxiety disorder, unspecified: Secondary | ICD-10-CM | POA: Insufficient documentation

## 2015-06-03 DIAGNOSIS — I5032 Chronic diastolic (congestive) heart failure: Secondary | ICD-10-CM | POA: Insufficient documentation

## 2015-06-03 DIAGNOSIS — E039 Hypothyroidism, unspecified: Secondary | ICD-10-CM | POA: Diagnosis not present

## 2015-06-03 DIAGNOSIS — I252 Old myocardial infarction: Secondary | ICD-10-CM | POA: Insufficient documentation

## 2015-06-03 DIAGNOSIS — D380 Neoplasm of uncertain behavior of larynx: Secondary | ICD-10-CM | POA: Diagnosis not present

## 2015-06-03 DIAGNOSIS — I480 Paroxysmal atrial fibrillation: Secondary | ICD-10-CM | POA: Diagnosis not present

## 2015-06-03 DIAGNOSIS — J439 Emphysema, unspecified: Secondary | ICD-10-CM | POA: Insufficient documentation

## 2015-06-03 DIAGNOSIS — Z87891 Personal history of nicotine dependence: Secondary | ICD-10-CM | POA: Insufficient documentation

## 2015-06-03 DIAGNOSIS — C321 Malignant neoplasm of supraglottis: Secondary | ICD-10-CM | POA: Insufficient documentation

## 2015-06-03 DIAGNOSIS — I2510 Atherosclerotic heart disease of native coronary artery without angina pectoris: Secondary | ICD-10-CM | POA: Insufficient documentation

## 2015-06-03 DIAGNOSIS — Z96641 Presence of right artificial hip joint: Secondary | ICD-10-CM | POA: Diagnosis not present

## 2015-06-03 DIAGNOSIS — Z79899 Other long term (current) drug therapy: Secondary | ICD-10-CM | POA: Diagnosis not present

## 2015-06-03 DIAGNOSIS — I110 Hypertensive heart disease with heart failure: Secondary | ICD-10-CM | POA: Diagnosis not present

## 2015-06-03 DIAGNOSIS — C329 Malignant neoplasm of larynx, unspecified: Secondary | ICD-10-CM | POA: Diagnosis not present

## 2015-06-03 DIAGNOSIS — K589 Irritable bowel syndrome without diarrhea: Secondary | ICD-10-CM | POA: Diagnosis not present

## 2015-06-03 DIAGNOSIS — J387 Other diseases of larynx: Secondary | ICD-10-CM | POA: Diagnosis not present

## 2015-06-03 DIAGNOSIS — Z9981 Dependence on supplemental oxygen: Secondary | ICD-10-CM | POA: Insufficient documentation

## 2015-06-03 DIAGNOSIS — Z7901 Long term (current) use of anticoagulants: Secondary | ICD-10-CM | POA: Insufficient documentation

## 2015-06-03 DIAGNOSIS — Z880 Allergy status to penicillin: Secondary | ICD-10-CM | POA: Diagnosis not present

## 2015-06-03 HISTORY — PX: DIRECT LARYNGOSCOPY: SHX5326

## 2015-06-03 LAB — POCT I-STAT 4, (NA,K, GLUC, HGB,HCT)
Glucose, Bld: 102 mg/dL — ABNORMAL HIGH (ref 65–99)
HEMATOCRIT: 36 % (ref 36.0–46.0)
Hemoglobin: 12.2 g/dL (ref 12.0–15.0)
POTASSIUM: 3.8 mmol/L (ref 3.5–5.1)
SODIUM: 142 mmol/L (ref 135–145)

## 2015-06-03 SURGERY — LARYNGOSCOPY, DIRECT
Anesthesia: General | Site: Throat

## 2015-06-03 MED ORDER — ESMOLOL HCL 100 MG/10ML IV SOLN
INTRAVENOUS | Status: DC | PRN
  Administered 2015-06-03: 40 mg via INTRAVENOUS

## 2015-06-03 MED ORDER — OXYMETAZOLINE HCL 0.05 % NA SOLN
NASAL | Status: AC
  Filled 2015-06-03: qty 15

## 2015-06-03 MED ORDER — DEXAMETHASONE SODIUM PHOSPHATE 10 MG/ML IJ SOLN
INTRAMUSCULAR | Status: DC | PRN
  Administered 2015-06-03: 10 mg via INTRAVENOUS

## 2015-06-03 MED ORDER — FENTANYL CITRATE (PF) 100 MCG/2ML IJ SOLN: 25.0000 ug | INTRAMUSCULAR | Status: DC | PRN

## 2015-06-03 MED ORDER — LIDOCAINE 2% (20 MG/ML) 5 ML SYRINGE
INTRAMUSCULAR | Status: AC
  Filled 2015-06-03: qty 10

## 2015-06-03 MED ORDER — EPINEPHRINE HCL (NASAL) 0.1 % NA SOLN
NASAL | Status: DC | PRN
  Administered 2015-06-03: 30 mL via TOPICAL

## 2015-06-03 MED ORDER — DEXAMETHASONE SODIUM PHOSPHATE 10 MG/ML IJ SOLN
INTRAMUSCULAR | Status: AC
  Filled 2015-06-03: qty 1

## 2015-06-03 MED ORDER — ONDANSETRON HCL 4 MG/2ML IJ SOLN
INTRAMUSCULAR | Status: DC | PRN
  Administered 2015-06-03: 4 mg via INTRAVENOUS

## 2015-06-03 MED ORDER — ESMOLOL HCL 100 MG/10ML IV SOLN
INTRAVENOUS | Status: AC
  Filled 2015-06-03: qty 10

## 2015-06-03 MED ORDER — EPINEPHRINE HCL 1 MG/ML IJ SOLN
INTRAMUSCULAR | Status: AC
  Filled 2015-06-03: qty 1

## 2015-06-03 MED ORDER — ONDANSETRON HCL 4 MG/2ML IJ SOLN
4.0000 mg | Freq: Once | INTRAMUSCULAR | Status: DC | PRN
Start: 2015-06-03 — End: 2015-06-03

## 2015-06-03 MED ORDER — FENTANYL CITRATE (PF) 250 MCG/5ML IJ SOLN
INTRAMUSCULAR | Status: AC
  Filled 2015-06-03: qty 5

## 2015-06-03 MED ORDER — MIDAZOLAM HCL 5 MG/5ML IJ SOLN
INTRAMUSCULAR | Status: DC | PRN
  Administered 2015-06-03: 50 mg via INTRAVENOUS

## 2015-06-03 MED ORDER — PROPOFOL 10 MG/ML IV BOLUS
INTRAVENOUS | Status: AC
  Filled 2015-06-03: qty 40

## 2015-06-03 MED ORDER — ONDANSETRON HCL 4 MG/2ML IJ SOLN
INTRAMUSCULAR | Status: AC
  Filled 2015-06-03: qty 2

## 2015-06-03 MED ORDER — ROCURONIUM BROMIDE 100 MG/10ML IV SOLN
INTRAVENOUS | Status: DC | PRN
  Administered 2015-06-03: 30 mg via INTRAVENOUS

## 2015-06-03 MED ORDER — FENTANYL CITRATE (PF) 100 MCG/2ML IJ SOLN
INTRAMUSCULAR | Status: DC | PRN
  Administered 2015-06-03 (×5): 50 ug via INTRAVENOUS

## 2015-06-03 MED ORDER — SUGAMMADEX SODIUM 500 MG/5ML IV SOLN
INTRAVENOUS | Status: DC | PRN
  Administered 2015-06-03: 120 mg via INTRAVENOUS

## 2015-06-03 MED ORDER — LACTATED RINGERS IV SOLN
INTRAVENOUS | Status: DC | PRN
  Administered 2015-06-03: 07:00:00 via INTRAVENOUS

## 2015-06-03 MED ORDER — PROPOFOL 10 MG/ML IV BOLUS
INTRAVENOUS | Status: DC | PRN
  Administered 2015-06-03: 80 mg via INTRAVENOUS

## 2015-06-03 MED ORDER — EPINEPHRINE HCL (NASAL) 0.1 % NA SOLN
NASAL | Status: AC
  Filled 2015-06-03: qty 30

## 2015-06-03 MED ORDER — LIDOCAINE HCL (CARDIAC) 20 MG/ML IV SOLN
INTRAVENOUS | Status: DC | PRN
  Administered 2015-06-03: 60 mg via INTRAVENOUS

## 2015-06-03 SURGICAL SUPPLY — 28 items
BLADE SURG 15 STRL LF DISP TIS (BLADE) IMPLANT
BLADE SURG 15 STRL SS (BLADE)
CANISTER SUCTION 2500CC (MISCELLANEOUS) ×3 IMPLANT
COVER MAYO STAND STRL (DRAPES) ×3 IMPLANT
COVER TABLE BACK 60X90 (DRAPES) ×3 IMPLANT
CRADLE DONUT ADULT HEAD (MISCELLANEOUS) ×3 IMPLANT
DRAPE PROXIMA HALF (DRAPES) ×3 IMPLANT
GAUZE SPONGE 4X4 16PLY XRAY LF (GAUZE/BANDAGES/DRESSINGS) ×3 IMPLANT
GLOVE BIOGEL PI IND STRL 7.5 (GLOVE) ×1 IMPLANT
GLOVE BIOGEL PI IND STRL 8 (GLOVE) ×1 IMPLANT
GLOVE BIOGEL PI INDICATOR 7.5 (GLOVE) ×2
GLOVE BIOGEL PI INDICATOR 8 (GLOVE) ×2
GLOVE ECLIPSE 7.5 STRL STRAW (GLOVE) ×3 IMPLANT
GLOVE SURG SS PI 7.5 STRL IVOR (GLOVE) ×3 IMPLANT
GLOVE SURG SS PI 8.0 STRL IVOR (GLOVE) ×3 IMPLANT
GUARD TEETH (MISCELLANEOUS) IMPLANT
KIT BASIN OR (CUSTOM PROCEDURE TRAY) ×3 IMPLANT
KIT ROOM TURNOVER OR (KITS) ×3 IMPLANT
NEEDLE HYPO 25GX1X1/2 BEV (NEEDLE) IMPLANT
NS IRRIG 1000ML POUR BTL (IV SOLUTION) ×3 IMPLANT
PAD ARMBOARD 7.5X6 YLW CONV (MISCELLANEOUS) ×6 IMPLANT
PATTIES SURGICAL .5 X3 (DISPOSABLE) ×3 IMPLANT
SOLUTION ANTI FOG 6CC (MISCELLANEOUS) IMPLANT
SPECIMEN JAR SMALL (MISCELLANEOUS) ×12 IMPLANT
SURGILUBE 2OZ TUBE FLIPTOP (MISCELLANEOUS) IMPLANT
TOWEL OR 17X24 6PK STRL BLUE (TOWEL DISPOSABLE) ×6 IMPLANT
TUBE CONNECTING 12'X1/4 (SUCTIONS) ×1
TUBE CONNECTING 12X1/4 (SUCTIONS) ×2 IMPLANT

## 2015-06-03 NOTE — Anesthesia Procedure Notes (Signed)
Procedure Name: Intubation Date/Time: 06/03/2015 7:50 AM Performed by: Salli Quarry Toniya Rozar Pre-anesthesia Checklist: Patient identified, Emergency Drugs available, Suction available and Patient being monitored Patient Re-evaluated:Patient Re-evaluated prior to inductionOxygen Delivery Method: Circle System Utilized Preoxygenation: Pre-oxygenation with 100% oxygen Intubation Type: IV induction Ventilation: Mask ventilation without difficulty Laryngoscope Size: Glidescope and 3 Grade View: Grade I Tube type: Oral Tube size: 6.0 mm Number of attempts: 1 Placement Confirmation: ETT inserted through vocal cords under direct vision,  positive ETCO2 and breath sounds checked- equal and bilateral Secured at: 21 cm Tube secured with: Tape Dental Injury: Teeth and Oropharynx as per pre-operative assessment  Difficulty Due To: Difficulty was anticipated and Difficult Airway- due to limited oral opening

## 2015-06-03 NOTE — Op Note (Signed)
Preop/postop diagnosis: Laryngeal mass Procedure: Direct laryngoscopy with biopsy Anesthesia: Gen. Estimated blood loss: Less than 5 cc Indications: A 81 year old with sore throat that has been going on for several months. She has had extensive workup with the exams and CT and MRI. She had a previous fiberoptic exam and then a repeat fiberoptic exam recently revealed a mass on the epiglottis. CT scan showed some nodularity in the epiglottis but otherwise no specific findings or masses. She now is for a direct laryngoscopy. She was informed risk and benefits of the procedure and options were discussed all questions are answered and consent was obtained. Procedure: Patient was taken to the operating room placed in the supine position after general endotracheal tube anesthesia was placed in the [facility] position. A moist Ray-Tec was placed over the upper alveolus. The Dedo scope was inserted in the pharynx and larynx were examined. There was immediately a obvious granular appearance to the mucosa from the left side of the epiglottis which had a little bit more exophytic component. It then had a superficial granular appearance that extended across the epiglottis toward the right down on the arytenoid and had some on the lateral pharyngeal wall. Biopsies are taken from the right lateral pharyngeal wall and the epiglottis. Postcricoid area appeared to be clear. The vocal cords are clear. Palpation of the area again reveals no mass effect. Pledgets were placed into the area with adrenaline. Good hemostasis was achieved. The patient was then awakened and brought to recovery room in stable condition counts correct

## 2015-06-03 NOTE — H&P (Signed)
Renee [name] is an 81 y.o. female.   Chief Complaint:throat lesion HPI: hx of sore throat and has a epigliottis lesion that needs biopsy  Past Medical History  Diagnosis Date  . IBS (irritable bowel syndrome)   . Coronary artery disease     a. CABG 09/2009. b. normal myoview in 05/2012.   [name] PAF (paroxysmal atrial fibrillation) ([place])     a. Post op after CABG. Intolerant of amio in the past. b. Recurrence - started on Tikosyn 11/2013.  [name] Dysphagia   . Hypertension   . HLD (hyperlipidemia)     can't take meds d/t joint aches/pains  . Chronic bronchitis ([place])   . Emphysema/COPD ([place])     on home O2  . Sciatic nerve pain   . Orthostatic hypotension   . Chronic respiratory failure ([place])   . Brain aneurysm   . Internal hemorrhoids   . PONV (postoperative nausea and vomiting)   . RBBB     takes Metoporolol daily  . Chronic diastolic CHF (congestive heart failure) (HCC)     was on Lasix but has been off for a few months  . Myocardial infarction [facility]) 2009    takes Eliquis daily.  . Emphysema lung ([place])   . Pneumonia     several yrs ago  . History of vertigo     doesn't take any meds  . Arthritis   . Osteoporosis   . Chronic back pain     buldging disc  . History of colon polyps     benign  . Hypothyroidism     takes Synthroid daily  . Macular degeneration     dry  . GERD (gastroesophageal reflux disease)     takes Nexium daily  . Anxiety     takes Xanax as needed  . History of shingles     Past Surgical History  Procedure Laterality Date  . Total hip arthroplasty Right 11/01/2008  . Appendectomy  1995  . Tonsillectomy and adenoidectomy    . Joint replacement    . Coronary artery bypass graft  09/21/2009    CABG X2  . Cardiac catheterization  1990's; 06/2009  . Cholecystectomy  2008  . Larynx surgery  ~ 1960    "tumor removed"  . Total abdominal hysterectomy  1986  . Tubal ligation  1973  . Cataract extraction w/ intraocular lens  implant, bilateral Bilateral    . Retinal laser procedure Left 10/19/2013    "had a wrinkle in it"  . Tee without cardioversion N/A 05/10/2014    Procedure: TRANSESOPHAGEAL ECHOCARDIOGRAM (TEE);  Surgeon: Sueanne Margarita, MD;  Location: [facility] ENDOSCOPY;  Service: Cardiovascular;  Laterality: N/A;  . Atrial fibrillation ablation N/A 05/11/2014    Procedure: ATRIAL FIBRILLATION ABLATION;  Surgeon: Thompson Grayer, MD;  Location: [facility] CATH LAB;  Service: Cardiovascular;  Laterality: N/A;  . Colonosocpy    . Esophagogastroduodenoscopy      Family History  Problem Relation Age of Onset  . Heart disease Mother   . Transient ischemic attack Mother   . Hypertension Mother   . Deep vein thrombosis Mother   . Heart attack Father   . Hypertension Father   . Diabetes Father   . Hyperlipidemia Father   . Breast cancer Sister   . Myasthenia gravis Brother   . Brain cancer Sister     mets from breast  . Colon polyps Brother   . Hypertension Brother   . Colon cancer Daughter   . Colon  cancer Other     nephew   Social History:  reports that she has quit smoking. Her smoking use included Cigarettes. She has never used smokeless tobacco. She reports that she does not drink alcohol or use illicit drugs.  Allergies:  Allergies  Allergen Reactions  . Levofloxacin Nausea Only    Unable to tolerate more than 7 days  . Penicillins Swelling and Rash    Swelling location undefined. Has patient had a PCN reaction causing immediate rash, facial/tongue/throat swelling, SOB or lightheadedness with hypotension: Yes Has patient had a PCN reaction causing severe rash involving mucus membranes or skin necrosis: No Has patient had a PCN reaction that required hospitalization Unk Has patient had a PCN reaction occurring within the last 10 years: Unk If all of the above answers are "NO", then may proceed with Cephalosporin use.   [name] Spiriva [Tiotropium Bromide Monohydrate] Other (See Comments)    Throat irritation  . Statins Other (See Comments)     Myalgias  . Budesonide-Formoterol Fumarate Other (See Comments)    Tachycardia  . Morphine Other (See Comments)    double vision  . Phenazopyridine Hcl Other (See Comments)    *piridium* face red  . Pneumococcal Vaccines Swelling    Redding of the skin  . Prednisone Swelling    Can't take PO but injectable is fine  . Sulfa Antibiotics Rash  . Sulfonamide Derivatives Rash    Medications Prior to Admission  Medication Sig Dispense Refill  . ALPRAZolam (XANAX) 0.25 MG tablet Take 0.25 mg by mouth 2 (two) times daily as needed for anxiety or sleep.    [name] apixaban (ELIQUIS) 5 MG TABS tablet Take 1 tablet (5 mg total) by mouth 2 (two) times daily. 60 tablet 1  . beclomethasone (QVAR) 80 MCG/ACT inhaler Inhale 2 puffs into the lungs 2 (two) times daily. 1 Inhaler 6  . cholecalciferol (VITAMIN D) 1000 UNITS tablet Take 1,000 Units by mouth daily.    [name] esomeprazole (NEXIUM) 40 MG capsule Take 40 mg by mouth daily before breakfast.      . levalbuterol (XOPENEX HFA) 45 MCG/ACT inhaler Inhale 2 puffs into the lungs every 6 (six) hours as needed for wheezing.    [name] levothyroxine (SYNTHROID, LEVOTHROID) 25 MCG tablet Take 25 mcg by mouth daily before breakfast.     . metoprolol succinate (TOPROL-XL) 25 MG 24 hr tablet Take 1 tablet (25 mg total) by mouth daily.    . nitroGLYCERIN (NITROSTAT) 0.4 MG SL tablet Place 0.4 mg under the tongue every 5 (five) minutes as needed for chest pain.    . OXYGEN Inhale 2 L into the lungs as needed (for shortness of breath).     [name] Glycol-Propyl Glycol (SYSTANE OP) Place 1 drop into both eyes 2 (two) times daily.     . potassium chloride SA (K-DUR,KLOR-CON) 20 MEQ tablet Take 20 mEq by mouth daily.    . Tiotropium Bromide Monohydrate (SPIRIVA RESPIMAT) 2.5 MCG/ACT AERS Inhale 2 puffs into the lungs daily. 1 Inhaler 5  . traMADol (ULTRAM) 50 MG tablet Take 1 tablet (50 mg total) by mouth every 6 (six) hours as needed. Pain 30 tablet 0    Results for orders  placed or performed during the hospital encounter of 06/03/15 (from the past 48 hour(s))  I-STAT 4, (NA,K, GLUC, HGB,HCT)     Status: Abnormal   Collection Time: 06/03/15  6:21 AM  Result Value Ref Range   Sodium 142 135 - 145 mmol/L  Potassium 3.8 3.5 - 5.1 mmol/L   Glucose, Bld 102 (H) 65 - 99 mg/dL   HCT 36.0 36.0 - 46.0 %   Hemoglobin 12.2 12.0 - 15.0 g/dL   No results found.  Review of Systems  Constitutional: Negative.   HENT: Negative.   Eyes: Negative.   Respiratory: Negative.   Cardiovascular: Negative.   Skin: Negative.   Neurological: Negative.     Blood pressure 151/71, pulse 75, temperature 97.9 F (36.6 C), temperature source Oral, resp. rate 20, height 5\' 6"  (1.676 m), weight 61.236 kg (135 lb), SpO2 96 %. Physical Exam  Constitutional: She appears well-developed and well-nourished.  HENT:  Head: Normocephalic and atraumatic.  Nose: Nose normal.  Mouth/Throat: Oropharynx is clear and moist.  Eyes: Conjunctivae are normal. Pupils are equal, round, and reactive to light.  Neck: Normal range of motion. Neck supple.  Cardiovascular: Normal rate.   Respiratory: Effort normal.  GI: Soft.  Musculoskeletal: Normal range of motion.     Assessment/Plan Epiglottic lesion- we discussed the procedure of DL and she is ready to proceed  [name], MD 06/03/2015, 7:11 AM

## 2015-06-03 NOTE — Transfer of Care (Signed)
Immediate Anesthesia Transfer of Care Note  Patient: Renee [name]  Procedure(s) Performed: Procedure(s): Direct laryngoscopy with biopsy left epiglottic lesion (N/A)  Patient Location: PACU  Anesthesia Type:General  Level of Consciousness: awake, alert , oriented and patient cooperative  Airway & Oxygen Therapy: Patient Spontanous Breathing and Patient connected to nasal cannula oxygen  Post-op Assessment: Report given to RN and Post -op Vital signs reviewed and stable  Post vital signs: Reviewed and stable  Last Vitals:  Filed Vitals:   06/03/15 0610  BP: 151/71  Pulse: 75  Temp: 36.6 C  Resp: 20    Last Pain:  Filed Vitals:   06/03/15 0626  PainSc: 4          Complications: No apparent anesthesia complications

## 2015-06-03 NOTE — Anesthesia Postprocedure Evaluation (Signed)
Anesthesia Post Note  Patient: Renee [name]  Procedure(s) Performed: Procedure(s) (LRB): Direct laryngoscopy with biopsy left epiglottic lesion (N/A)  Patient location during evaluation: PACU Anesthesia Type: General Level of consciousness: awake and alert Pain management: pain level controlled Vital Signs Assessment: post-procedure vital signs reviewed and stable Respiratory status: spontaneous breathing, nonlabored ventilation, respiratory function stable and patient connected to nasal cannula oxygen Cardiovascular status: blood pressure returned to baseline and stable Postop Assessment: no signs of nausea or vomiting Anesthetic complications: no    Last Vitals:  Filed Vitals:   06/03/15 0847 06/03/15 0904  BP: 157/68 134/83  Pulse: 77 80  Temp:    Resp: 19 22    Last Pain:  Filed Vitals:   06/03/15 0906  PainSc: 4                  Catalina Gravel

## 2015-06-06 ENCOUNTER — Encounter (HOSPITAL_COMMUNITY): Payer: Self-pay | Admitting: Otolaryngology

## 2015-06-13 ENCOUNTER — Encounter: Payer: Self-pay | Admitting: Radiation Oncology

## 2015-06-14 ENCOUNTER — Telehealth: Payer: Self-pay | Admitting: *Deleted

## 2015-06-14 NOTE — Telephone Encounter (Signed)
  Oncology Nurse Navigator Documentation  Navigator Location: [facility] (06/14/15 1644) Navigator Encounter Type: Introductory phone call;Education (06/14/15 1644)             Treatment Phase: Pre-Tx/Tx Discussion (06/14/15 1644)       Placed introductory call to new referral patient.  Introduced myself as the H&N oncology nurse navigator that works with Dr. [name] to whom she has been referred by Dr. [name].  She confirmed understanding of referral and appt date/time of 06/22/15 1:30 NE, 2:00 Dr. [name].  I briefly explained my role as his navigator, indicated that I would be joining her during her appt next week.  I answered her questions regarding radiation treatment.  I confirmed understanding of [place] location, explained arrival and RadOnc registration process for appt.  I provided my contact information, encouraged her to call with questions/concerns before next week.  She verbalized understanding of information provided, expressed appreciation for my call.  [name], RN, BSN, [place] [facility] [place] [phone]                           Time Spent with Patient: 15 (06/14/15 1644)

## 2015-06-15 ENCOUNTER — Encounter: Payer: Self-pay | Admitting: Internal Medicine

## 2015-06-15 ENCOUNTER — Ambulatory Visit (INDEPENDENT_AMBULATORY_CARE_PROVIDER_SITE_OTHER): Payer: Medicare Other | Admitting: Internal Medicine

## 2015-06-15 VITALS — BP 132/76 | HR 83 | Ht 66.0 in | Wt 131.0 lb

## 2015-06-15 DIAGNOSIS — J449 Chronic obstructive pulmonary disease, unspecified: Secondary | ICD-10-CM | POA: Diagnosis not present

## 2015-06-15 DIAGNOSIS — J418 Mixed simple and mucopurulent chronic bronchitis: Secondary | ICD-10-CM | POA: Diagnosis not present

## 2015-06-15 MED ORDER — IPRATROPIUM-ALBUTEROL 0.5-2.5 (3) MG/3ML IN SOLN: 3.0000 mL | Freq: Four times a day (QID) | RESPIRATORY_TRACT | Status: DC

## 2015-06-15 NOTE — Patient Instructions (Addendum)
ICD-9-CM ICD-10-CM   1. Moderate COPD (chronic obstructive pulmonary disease) (HCC) 496 J44.9    Stable copd  PLAN  - Given recent diagnosis of throat cancer we will make a switch from inhaler to nebulizer - Start DuoNeb 4 times daily with albuterol as needed  - We will process this through DME company through Medicare part D Best wishes for radiation therapy of the neck-   Follow-up - Please call us in one week after starting the nebulizers with response - Otherwise return in 3 months or sooner if needed

## 2015-06-15 NOTE — Progress Notes (Signed)
Subjective:     Patient ID: Renee [name], female   DOB: 06-29-1934, 81 y.o.   MRN: [id number]  HPI   OV 12/13/2014  Chief Complaint  Patient presents with  . Follow-up    Former PW pt. Pt saw TP on 10.25.16 as an acute visit. Pt states she feels no improvement since OV. Pt c/o prod cough with green mucus, increase SOB, chest congestion, PND. Pt c/o hemoptysis intermittenly since Jan. 2016. Pt states the mucus contains blood and discolored mucus    follow-up patient of Dr. Asencion Noble with COPD FEV1 64% in March 2014. CT scan chest in 2009 showing emphysema and low-dose CT scan chest May 2016 without any lung cancer.  She presents with her husband. At baseline she uses oxygen with exertion. I notice that she's not on any inhale maintenance drugs for her COPD. She tells me that since January 2016 he has had waxing and waning throat irritation that got particularly worse in April after she had a transesophageal echocardiogram and ablation. Apparently at this time she had some bleeding in her mouth. After that she is seen ENT evaluation. She is even been taken off her inhalers. She has an allergy listed to Spiriva as throat irritation. In addition chart review shows that she was also taken off Tudorza and none of these measures have helped her throat irritation. She has cough associated with this.  In support of this she says the last few weeks the genital irritation from her throat is worse associated with green mucus general shortness of breath and chest congestion. There is also some occasional hemoptysis with the same.  She wants a lighter oxygen tank but when she walked her 185 feet 3 laps on room air: She was able to walk only 2 laps and stopped due to fatigue and shortness of breath but did not desaturate.   Note: I personally  visualize  CT images    OV 03/16/2015  Chief Complaint  Patient presents with  . Follow-up    Pt here after HRCT and PFT. Pt states her breathing is  unchanged since last OV.  Pt states the spiriva respimat makes her cough more. Pt c/o prod cough with thick green mucus. Pt denies CP/tightness and f/c/s.     Follow-up moderate COPD with over perception of symptoms. Last seen November 2016. At that time I subjected her to the following evaluation listed below. It appears that only problem is COPD/emphysema. Gold stage II. In the interim she's developed some atrial fibrillation. She is known to have coronary artery disease. She says this is being taken care of by her cardiology team. She says that her bigger issues this throat irritation from Spiriva powder. She wants to try other inhalers. She last attended pulmonary rehabilitation 5 years ago after cardiac surgery. She is open to attending it again.   Full pulmonary function test February 2017 shows FEV1 1.61 L/73% post broncho-dilator response which is only 5% and a ratio of 81. Total lung capacity is 3.9 L/73%. DLCO 7.5/42%. Pulmonary function test suggests restriction. High-resolution CT chest 12/21/2014 paradoxically does not show any interstitial lung disease. Consider shows only emphysema. She does have coronary artery calcification. She did have overnight oxygen test November 2016 and she did  desaturate. However she tried nocturnal oxygen but it did not help her. She does not want to use oxygen    OV 06/15/2015  Chief Complaint  Patient presents with  . Follow-up    Pt states she  her breathing is unchanged since last OV - pt states she has not been taking the Stiolto and Qvar because they make her cough. Pt states she was recently diagnosed with throat cancer. Pt states she has had an increase in prod cough with clear to green mucus.       Mod copd fu - copd is stable . This is 3 mo fu. Since seeing me saw Dr Caryl Comes EP in feb 2017 and noticed to have orthostatic tachycardia without evidence of interval A Fib. Diuretic was stopped. Lopressors stopped. In itnermim due to voice issues saw  ENT and underent bx - 06/03/15 of throat and dx with epiglottis and larynx Squamous cell Ca. This explains her throat issues. Because ioft his she wants to change to nebs. XRT neck pending   has a past medical history of IBS (irritable bowel syndrome); Coronary artery disease; PAF (paroxysmal atrial fibrillation) ([place]); Dysphagia; Hypertension; HLD (hyperlipidemia); Chronic bronchitis ([place]); Emphysema/COPD ([place]); Sciatic nerve pain; Orthostatic hypotension; Chronic respiratory failure ([place]); Brain aneurysm; Internal hemorrhoids; PONV (postoperative nausea and vomiting); RBBB; Chronic diastolic CHF (congestive heart failure) ([place]); Myocardial infarction [facility]) (2009); Emphysema lung ([place]); Pneumonia; History of vertigo; Arthritis; Osteoporosis; Chronic back pain; History of colon polyps; Hypothyroidism; Macular degeneration; GERD (gastroesophageal reflux disease); Anxiety; and History of shingles.   reports that she has quit smoking. Her smoking use included Cigarettes. She has never used smokeless tobacco.  Past Surgical History  Procedure Laterality Date  . Total hip arthroplasty Right 11/01/2008  . Appendectomy  1995  . Tonsillectomy and adenoidectomy    . Joint replacement    . Coronary artery bypass graft  09/21/2009    CABG X2  . Cardiac catheterization  1990's; 06/2009  . Cholecystectomy  2008  . Larynx surgery  ~ 1960    "tumor removed"  . Total abdominal hysterectomy  1986  . Tubal ligation  1973  . Cataract extraction w/ intraocular lens  implant, bilateral Bilateral   . Retinal laser procedure Left 10/19/2013    "had a wrinkle in it"  . Tee without cardioversion N/A 05/10/2014    Procedure: TRANSESOPHAGEAL ECHOCARDIOGRAM (TEE);  Surgeon: Sueanne Margarita, MD;  Location: [facility] ENDOSCOPY;  Service: Cardiovascular;  Laterality: N/A;  . Atrial fibrillation ablation N/A 05/11/2014    Procedure: ATRIAL FIBRILLATION ABLATION;  Surgeon: Thompson Grayer, MD;  Location: [facility] CATH LAB;  Service: Cardiovascular;   Laterality: N/A;  . Colonosocpy    . Esophagogastroduodenoscopy    . Direct laryngoscopy N/A 06/03/2015    Procedure: Direct laryngoscopy with biopsy left epiglottic lesion;  Surgeon: [name], MD;  Location: [place];  Service: ENT;  Laterality: N/A;    Allergies  Allergen Reactions  . Levofloxacin Nausea Only    Unable to tolerate more than 7 days  . Penicillins Swelling and Rash    Swelling location undefined. Has patient had a PCN reaction causing immediate rash, facial/tongue/throat swelling, SOB or lightheadedness with hypotension: Yes Has patient had a PCN reaction causing severe rash involving mucus membranes or skin necrosis: No Has patient had a PCN reaction that required hospitalization Unk Has patient had a PCN reaction occurring within the last 10 years: Unk If all of the above answers are "NO", then may proceed with Cephalosporin use.   [name] Spiriva [Tiotropium Bromide Monohydrate] Other (See Comments)    Throat irritation  . Statins Other (See Comments)    Myalgias  . Budesonide-Formoterol Fumarate Other (See Comments)    Tachycardia  . Morphine Other (See  Comments)    double vision  . Phenazopyridine Hcl Other (See Comments)    *piridium* face red  . Pneumococcal Vaccines Swelling    Redding of the skin  . Prednisone Swelling    Can't take PO but injectable is fine  . Sulfa Antibiotics Rash  . Sulfonamide Derivatives Rash    Immunization History  Administered Date(s) Administered  . Influenza Split 10/08/2011  . Influenza Whole 10/06/2009, 12/07/2010  . Influenza,inj,Quad PF,36+ Mos 11/13/2012, 12/07/2014  . Influenza-Unspecified 11/13/2013  . Pneumococcal Polysaccharide-23 02/06/2007    Family History  Problem Relation Age of Onset  . Heart disease Mother   . Transient ischemic attack Mother   . Hypertension Mother   . Deep vein thrombosis Mother   . Heart attack Father   . Hypertension Father   . Diabetes Father   . Hyperlipidemia Father   .  Breast cancer Sister   . Myasthenia gravis Brother   . Brain cancer Sister     mets from breast  . Colon polyps Brother   . Hypertension Brother   . Colon cancer Daughter   . Colon cancer Other     nephew     Current outpatient prescriptions:  .  ALPRAZolam (XANAX) 0.25 MG tablet, Take 0.25 mg by mouth 2 (two) times daily as needed for anxiety or sleep., Disp: , Rfl:  .  apixaban (ELIQUIS) 5 MG TABS tablet, Take 1 tablet (5 mg total) by mouth 2 (two) times daily., Disp: 60 tablet, Rfl: 1 .  cholecalciferol (VITAMIN D) 1000 UNITS tablet, Take 1,000 Units by mouth daily., Disp: , Rfl:  .  esomeprazole (NEXIUM) 40 MG capsule, Take 40 mg by mouth daily before breakfast.  , Disp: , Rfl:  .  levothyroxine (SYNTHROID, LEVOTHROID) 25 MCG tablet, Take 25 mcg by mouth daily before breakfast. , Disp: , Rfl:  .  metoprolol succinate (TOPROL-XL) 25 MG 24 hr tablet, Take 1 tablet (25 mg total) by mouth daily., Disp: , Rfl:  .  nitroGLYCERIN (NITROSTAT) 0.4 MG SL tablet, Place 0.4 mg under the tongue every 5 (five) minutes as needed for chest pain., Disp: , Rfl:  .  OXYGEN, Inhale 2 L into the lungs as needed (for shortness of breath). , Disp: , Rfl:  .  Polyethyl Glycol-Propyl Glycol (SYSTANE OP), Place 1 drop into both eyes 2 (two) times daily. , Disp: , Rfl:  .  traMADol (ULTRAM) 50 MG tablet, Take 1 tablet (50 mg total) by mouth every 6 (six) hours as needed. Pain, Disp: 30 tablet, Rfl: 0 .  [DISCONTINUED] budesonide-formoterol (SYMBICORT) 160-4.5 MCG/ACT inhaler, Inhale 2 puffs into the lungs 2 (two) times daily., Disp: 1 Inhaler, Rfl: 12 .  [DISCONTINUED] calcium citrate-vitamin D (CITRACAL+D) 315-200 MG-UNIT per tablet, Take 1 tablet by mouth 2 (two) times daily.  , Disp: , Rfl:     Review of Systems Per hpi    Objective:   Physical Exam  Constitutional: She is oriented to person, place, and time. She appears well-developed and well-nourished. No distress.  HENT:  Head: Normocephalic and  atraumatic.  Right Ear: External ear normal.  Left Ear: External ear normal.  Mouth/Throat: Oropharynx is clear and moist. No oropharyngeal exudate.  Eyes: Conjunctivae and EOM are normal. Pupils are equal, round, and reactive to light. Right eye exhibits no discharge. Left eye exhibits no discharge. No scleral icterus.  Neck: Normal range of motion. Neck supple. No JVD present. No tracheal deviation present. No thyromegaly present.  Cardiovascular: Normal rate,  regular rhythm, normal heart sounds and intact distal pulses.  Exam reveals no gallop and no friction rub.   No murmur heard. Pulmonary/Chest: Effort normal and breath sounds normal. No respiratory distress. She has no wheezes. She has no rales. She exhibits no tenderness.  cabg scar  Abdominal: Soft. Bowel sounds are normal. She exhibits no distension and no mass. There is no tenderness. There is no rebound and no guarding.  Musculoskeletal: Normal range of motion. She exhibits no edema or tenderness.  Lymphadenopathy:    She has no cervical adenopathy.  Neurological: She is alert and oriented to person, place, and time. She has normal reflexes. No cranial nerve deficit. She exhibits normal muscle tone. Coordination normal.  Skin: Skin is warm and dry. No rash noted. She is not diaphoretic. No erythema. No pallor.  Psychiatric: She has a normal mood and affect. Her behavior is normal. Judgment and thought content normal.  Vitals reviewed.   Filed Vitals:   06/15/15 1648  BP: 132/76  Pulse: 83  Height: 5\' 6"  (1.676 m)  Weight: 131 lb (59.421 kg)  SpO2: 95%       Assessment:       ICD-9-CM ICD-10-CM   1. Mixed simple and mucopurulent chronic bronchitis ([place]) 491.1 J41.8 Ambulatory Referral for DME  2. Moderate COPD (chronic obstructive pulmonary disease) (HCC) 496 J44.9        Plan:      Stable copd  PLAN  - Given recent diagnosis of throat cancer we will make a switch from inhaler to nebulizer - Start DuoNeb 4  times daily with albuterol as needed  - We will process this through DME company through Medicare part D Best wishes for radiation therapy of the neck-   Follow-up - Please call us in one week after starting the nebulizers with response - Otherwise return in 3 months or sooner if neede  (> 50% of this 15 min visit spent in face to face counseling or/and coordination of care)   Dr. Brand Males, M.D., [facility].C.P Pulmonary and Critical Care Medicine Staff Physician [facility] Pager: [phone], If no answer or between  15:00h - 7:00h: call [phone]  06/15/2015 5:26 PM

## 2015-06-16 DIAGNOSIS — N390 Urinary tract infection, site not specified: Secondary | ICD-10-CM | POA: Diagnosis not present

## 2015-06-16 DIAGNOSIS — E538 Deficiency of other specified B group vitamins: Secondary | ICD-10-CM | POA: Diagnosis not present

## 2015-06-16 DIAGNOSIS — R8290 Unspecified abnormal findings in urine: Secondary | ICD-10-CM | POA: Diagnosis not present

## 2015-06-16 DIAGNOSIS — E559 Vitamin D deficiency, unspecified: Secondary | ICD-10-CM | POA: Diagnosis not present

## 2015-06-16 DIAGNOSIS — R7301 Impaired fasting glucose: Secondary | ICD-10-CM | POA: Diagnosis not present

## 2015-06-16 DIAGNOSIS — M810 Age-related osteoporosis without current pathological fracture: Secondary | ICD-10-CM | POA: Diagnosis not present

## 2015-06-16 DIAGNOSIS — E038 Other specified hypothyroidism: Secondary | ICD-10-CM | POA: Diagnosis not present

## 2015-06-16 DIAGNOSIS — E784 Other hyperlipidemia: Secondary | ICD-10-CM | POA: Diagnosis not present

## 2015-06-16 DIAGNOSIS — I10 Essential (primary) hypertension: Secondary | ICD-10-CM | POA: Diagnosis not present

## 2015-06-16 NOTE — Progress Notes (Addendum)
Head and Neck Cancer Location of Tumor / Histology:  06/03/15 Diagnosis 1. Epiglottis, biopsy - SQUAMOUS CELL CARCINOMA. - SEE COMMENT. 2. Larynx, biopsy, right lateral wall - SQUAMOUS CELL CARCINOMA.   Patient presented with symptoms of:  Persistent pain in the right upper palate, pain with swallowing in that location as well as throat.   Biopsies of Epiglottis and Larynx revealed: Squamous cell carcinoma  Nutrition Status Yes No Comments  Weight changes? [x]  []  In October 2016 she weighed 156 which was her normal weight. In the past 8 months she has lost about 25 lbs  Swallowing concerns? [x]  []  She has pain when swallowing, she also feels like her throat is closing up. She reports some thick saliva, and difficulty tasting things.   PEG? []  [x]     Referrals Yes No Comments  Social Work? []  [x]    Dentistry? []  [x]    Swallowing therapy? []  [x]    Nutrition? []  [x]    Med/Onc? []  [x]     Safety Issues Yes No Comments  Prior radiation? []  [x]    Pacemaker/ICD? []  [x]    Possible current pregnancy? []  [x]    Is the patient on methotrexate? []  [x]     Tobacco/Marijuana/Snuff/ETOH use: Former smoker, she quit 09/2009 before her open heart surgery.  She does not drink alcohol.   Past/Anticipated interventions by otolaryngology, if any:  06/03/15 Dr. [name] performed: Direct laryngoscopy with bx epiglottic lesion.   Past/Anticipated interventions by medical oncology, if any:  none      Current Complaints / other details:    BP 110/57 mmHg  Pulse 92  Temp(Src) 97.7 F (36.5 C)  Ht 5\' 6"  (1.676 m)  Wt 129 lb 1.6 oz (58.559 kg)  BMI 20.85 kg/m2  SpO2 97%   She is orthostatic today which she reports is a recent problem:  BP sitting 144/64 pulse 84. BP standing 110/57 pulse 92  Wt Readings from Last 3 Encounters:  06/22/15 129 lb 1.6 oz (58.559 kg)  06/15/15 131 lb (59.421 kg)  06/03/15 135 lb (61.236 kg)

## 2015-06-17 ENCOUNTER — Telehealth: Payer: Self-pay | Admitting: Internal Medicine

## 2015-06-17 NOTE — Telephone Encounter (Signed)
Called spoke with pt. She states she is unsure how she is going to get the medication. I explained to her that the order was placed for the machine and the medication on 06/15/15. She stated that she was contacted by [facility] and was told they would get the medication through [facility] pharmacy. She states she will await medication's arrival from pharmacy. She voiced understanding and had no further questions. Nothing further needed at this time.

## 2015-06-21 NOTE — Progress Notes (Addendum)
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 Squamous cell carcinoma of glottis (HCC) 161.0 C32.0 Ambulatory referral to Social Work     laryngocopy solution for Rad-Onc     Fiberoptic laryngoscopy  2. Cancer, epiglottis (HCC) 161.1 C32.1 sucralfate (CARAFATE) 1 g tablet     lidocaine (XYLOCAINE) 2 % solution     SLP modified barium swallow     Ambulatory referral to Dentistry     Referral to Neuro Rehab     Amb Referral to Nutrition and Diabetic E     Ambulatory referral to Physical Therapy     Ambulatory referral to Social Work     NM PET Image Initial (PI) Skull Base To Thigh     CANCELED: CT Chest W Contrast    HISTORY OF PRESENT ILLNESS::Renee [name] is a 81 y.o. female who presented with 1 yr 4 mo history of sore throat, dysphagia, thick phlegm, and scant hemoptysis.  Also right palate pain. She has lost 25lbs.  She is still able to swallow soft foods ie pudding, cottage cheese, yogurt, eggs.  She reports that an initial ENT doctor reassured her that she was okay.  She then saw DR [name] months later.  MRI of brain ruled out cranial nerve or midbrain lesions. CT neck showed a 31mm epiglottic mass, left.  No neck adenopathy.   .He appreciated a; left  epiglottic lesion on laryngoscope exam.  Possibly some pharyngeal wall extension.   Biopsy of epiglottis and right lateral wall of larynx  on 05/08/15 revealed: p16+ squamous cell carcinoma   Other symptoms: recent orthostasis  Tobacco history, if any: quit smoking in 2011  ETOH abuse, if any: denies   Of note, patient reports that the right hard palate was recently biopsied by an oral surgeon, found to be  negative for cancer.  Also, she reports a benign laryngeal tumor was removed when she was 81 yo.   PREVIOUS RADIATION THERAPY: no  PAST MEDICAL HISTORY:  has a past medical history of IBS (irritable bowel syndrome); Coronary artery disease; PAF (paroxysmal atrial fibrillation) ([place]); Dysphagia; Hypertension; HLD (hyperlipidemia); Chronic bronchitis ([place]); Emphysema/COPD ([place]); Sciatic nerve pain; Orthostatic hypotension; Chronic respiratory failure ([place]); Brain aneurysm; Internal hemorrhoids; PONV (postoperative nausea and vomiting); RBBB; Chronic diastolic CHF (congestive heart failure) ([place]); Myocardial infarction [facility]) (2009); Emphysema lung ([place]); Pneumonia; History of vertigo; Arthritis; Osteoporosis; Chronic back pain; History of colon polyps; Hypothyroidism; Macular degeneration; GERD (gastroesophageal reflux disease); Anxiety; and History of shingles.    PAST SURGICAL HISTORY: Past Surgical History  Procedure Laterality Date  . Total hip arthroplasty Right 11/01/2008  . Appendectomy  1995  . Tonsillectomy and adenoidectomy    . Joint replacement    . Coronary artery bypass graft  09/21/2009    CABG X2  . Cardiac catheterization  1990's; 06/2009  . Cholecystectomy  2008  . Larynx surgery  ~ 1960    "tumor removed"  . Total abdominal hysterectomy  1986  . Tubal ligation  1973  . Cataract extraction w/ intraocular lens  implant, bilateral Bilateral   . Retinal laser procedure Left 10/19/2013    "had a wrinkle in it"  . Darden Dates  without cardioversion N/A 05/10/2014    Procedure: TRANSESOPHAGEAL ECHOCARDIOGRAM (TEE);  Surgeon: Sueanne Margarita, MD;  Location: [facility] ENDOSCOPY;  Service: Cardiovascular;  Laterality: N/A;  . Atrial fibrillation ablation N/A 05/11/2014    Procedure: ATRIAL FIBRILLATION ABLATION;  Surgeon: Thompson Grayer, MD;  Location: [facility] CATH LAB;  Service: Cardiovascular;  Laterality: N/A;  . Colonosocpy    . Esophagogastroduodenoscopy    . Direct laryngoscopy N/A 06/03/2015    Procedure:  Direct laryngoscopy with biopsy left epiglottic lesion;  Surgeon: [name], MD;  Location: [place];  Service: ENT;  Laterality: N/A;    FAMILY HISTORY: family history includes Brain cancer in her sister; Breast cancer in her sister; Colon cancer in her daughter and other; Colon polyps in her brother; Deep vein thrombosis in her mother; Diabetes in her father; Heart attack in her father; Heart disease in her mother; Hyperlipidemia in her father; Hypertension in her brother, father, and mother; Myasthenia gravis in her brother; Transient ischemic attack in her mother.  SOCIAL HISTORY:  reports that she has quit smoking. Her smoking use included Cigarettes. She has never used smokeless tobacco. She reports that she does not drink alcohol or use illicit drugs.  ALLERGIES: Levofloxacin; Penicillins; Spiriva; Statins; Budesonide-formoterol fumarate; Morphine; Phenazopyridine hcl; Pneumococcal vaccines; Prednisone; Sulfa antibiotics; and Sulfonamide derivatives  MEDICATIONS:  Current Outpatient Prescriptions  Medication Sig Dispense Refill  . ALPRAZolam (XANAX) 0.25 MG tablet Take 0.25 mg by mouth 2 (two) times daily as needed for anxiety or sleep.    [name] apixaban (ELIQUIS) 5 MG TABS tablet Take 1 tablet (5 mg total) by mouth 2 (two) times daily. 60 tablet 1  . cholecalciferol (VITAMIN D) 1000 UNITS tablet Take 1,000 Units by mouth daily.    [name] esomeprazole (NEXIUM) 40 MG capsule Take 40 mg by mouth daily before breakfast.      . ipratropium-albuterol (DUONEB) 0.5-2.5 (3) MG/3ML SOLN Take 3 mLs by nebulization 4 (four) times daily. 360 mL 11  . levothyroxine (SYNTHROID, LEVOTHROID) 25 MCG tablet Take 25 mcg by mouth daily before breakfast.     . metoprolol succinate (TOPROL-XL) 25 MG 24 hr tablet Take 1 tablet (25 mg total) by mouth daily.    . nitroGLYCERIN (NITROSTAT) 0.4 MG SL tablet Place 0.4 mg under the tongue every 5 (five) minutes as needed for chest pain.    . OXYGEN Inhale 2 L into the lungs as  needed (for shortness of breath).     [name] Glycol-Propyl Glycol (SYSTANE OP) Place 1 drop into both eyes 2 (two) times daily.     . traMADol (ULTRAM) 50 MG tablet Take 1 tablet (50 mg total) by mouth every 6 (six) hours as needed. Pain 30 tablet 0  . lidocaine (XYLOCAINE) 2 % solution Mix 1 part 2%viscous lidocaine,1part H2O.Swish and/or swallow 55mL of this mixture,51min before meals and at bedtime, up to QID 100 mL 5  . sucralfate (CARAFATE) 1 g tablet Dissolve 1 tablet in 36mL H2O and swallow up to QID,PRN sore throat. 60 tablet 5  . [DISCONTINUED] budesonide-formoterol (SYMBICORT) 160-4.5 MCG/ACT inhaler Inhale 2 puffs into the lungs 2 (two) times daily. 1 Inhaler 12  . [DISCONTINUED] calcium citrate-vitamin D (CITRACAL+D) 315-200 MG-UNIT per tablet Take 1 tablet by mouth 2 (two) times daily.       No current facility-administered medications for this encounter.    REVIEW OF SYSTEMS:  Notable for that above.   PHYSICAL EXAM:  height is 5\' 6"  (1.676 m) and weight is 129 lb  1.6 oz (58.559 kg). Her temperature is 97.7 F (36.5 C). Her blood pressure is 110/57 and her pulse is 92. Her oxygen saturation is 97%.   General: Alert and oriented, in no acute distress HEENT: Head is normocephalic. Extraocular movements are intact. Oropharynx is notable for a punctate firm spot over right hard palate, very subtle and nonspecific, does not appear suspicious. Neck: Neck is notable for no palpable masses Heart: Regular in rate and rhythm with no murmurs, rubs, or gallops. Chest: Clear to auscultation bilaterally, with no rhonchi, wheezes, or rales. Abdomen: Soft, nontender, nondistended, with no rigidity or guarding. Extremities: No cyanosis or edema. Lymphatics: see Neck Exam Skin: No concerning lesions. Musculoskeletal: symmetric strength and muscle tone throughout. Neurologic: Cranial nerves II through XII are grossly intact. No obvious focalities. Speech is fluent. Coordination is  intact. Psychiatric: Judgment and insight are intact. Affect is appropriate.  PROCEDURE NOTE: After anesthetizing the nasal cavity with topical lidocaine and phenylephrine, the flexible endoscope was introduced and passed through the nasal cavity.  There is an erosive epiglottic tumor that appears to have eroded the majority of the epiglottic tissue.  Remaining tissue in epiglottis is erythematous and abnormal appearing.  I don't appreciate any other pharyngeal or laryngeal lesions.  True cords are symmetrically mobile.  No active bleeding.  ECOG = 1  0 - Asymptomatic (Fully active, able to carry on all predisease activities without restriction)  1 - Symptomatic but completely ambulatory (Restricted in physically strenuous activity but ambulatory and able to carry out work of a light or sedentary nature. For example, light housework, office work)  2 - Symptomatic, <50% in bed during the day (Ambulatory and capable of all self care but unable to carry out any work activities. Up and about more than 50% of waking hours)  3 - Symptomatic, >50% in bed, but not bedbound (Capable of only limited self-care, confined to bed or chair 50% or more of waking hours)  4 - Bedbound (Completely disabled. Cannot carry on any self-care. Totally confined to bed or chair)  5 - Death   Eustace Pen MM, Creech RH, Tormey DC, et al. [phone]). "Toxicity and response criteria of the [facility]". [place] Oncol. 5 (6): 649-55   LABORATORY DATA:  Lab Results  Component Value Date   WBC 6.7 06/02/2015   HGB 12.2 06/03/2015   HCT 36.0 06/03/2015   MCV 86.3 06/02/2015   PLT 462* 06/02/2015   CMP     Component Value Date/Time   NA 142 06/03/2015 0621   NA 144 01/02/2014 0000   K 3.8 06/03/2015 0621   CL 99* 06/02/2015 1133   CO2 29 06/02/2015 1133   GLUCOSE 102* 06/03/2015 0621   GLUCOSE 72 01/02/2014 0000   BUN <5* 06/02/2015 1133   BUN 13 01/02/2014 0000   CREATININE 0.95 06/02/2015  1133   CREATININE 0.96* 04/25/2015 1104   CALCIUM 8.5* 06/02/2015 1133   PROT 6.5 09/21/2009 0630   ALBUMIN 3.2* 09/21/2009 0630   AST 23 09/21/2009 0630   ALT 15 09/21/2009 0630   ALKPHOS 127* 09/21/2009 0630   BILITOT 0.8 09/21/2009 0630   GFRNONAA 56* 06/02/2015 1133   GFRAA >60 06/02/2015 1133      Lab Results  Component Value Date   TSH 1.98 01/18/2014      RADIOGRAPHY:  As above    IMPRESSION/PLAN:  This is a delightful patient with head and neck cancer. I recommend radiotherapy for this patient.  First, I  recommend a PET scan to rule out distant mets.  Her weight loss, while likely due to dysphagia, is concerning.  We discussed the potential risks, benefits, and side effects of radiotherapy. We talked in detail about acute and late effects. We discussed that some of the most bothersome acute effects may be mucositis, dysgeusia, salivary changes, skin irritation, hair loss, dehydration, weight loss and fatigue. We talked about late effects which include but are not necessarily limited to dysphagia, hypothyroidism, nerve injury, spinal cord injury, xerostomia, trismus, and neck edema. No guarantees of treatment were given. A consent form was signed and placed in the patient's medical record. The patient is enthusiastic about proceeding with treatment. I look forward to participating in the patient's care.    Simulation (treatment planning) will take place after dentistry and PET scan appts.  We also discussed that the treatment of head and neck cancer is a multidisciplinary process to maximize treatment outcomes and quality of life. For this reasons the following referrals have been or will be made:      Dentistry for dental evaluation,  advice on reducing risk of cavities, osteoradionecrosis, or other oral issues. Scatter guards.  I don't anticipate a likely need for extractions nor high dose to the tooth roots.    Nutritionist for nutrition support during and after  treatment.    Speech language pathology for swallowing and/or speech therapy. MBSS.     Social work for social support.     Physical therapy due to risk of lymphedema in neck and deconditioning.    Baseline labs including TSH.   __________________________________________   Eppie Gibson, MD

## 2015-06-22 ENCOUNTER — Encounter: Payer: Self-pay | Admitting: Radiation Oncology

## 2015-06-22 ENCOUNTER — Ambulatory Visit
Admission: RE | Admit: 2015-06-22 | Discharge: 2015-06-22 | Disposition: A | Discharge status: Home / Self Care | Payer: Medicare Other | Source: Ambulatory Visit | Attending: Radiation Oncology | Admitting: Radiation Oncology

## 2015-06-22 ENCOUNTER — Encounter: Payer: Self-pay | Admitting: *Deleted

## 2015-06-22 VITALS — BP 110/57 | HR 92 | Temp 97.7°F | Ht 66.0 in | Wt 129.1 lb

## 2015-06-22 DIAGNOSIS — C320 Malignant neoplasm of glottis: Secondary | ICD-10-CM | POA: Diagnosis not present

## 2015-06-22 DIAGNOSIS — Z87891 Personal history of nicotine dependence: Secondary | ICD-10-CM | POA: Insufficient documentation

## 2015-06-22 DIAGNOSIS — C321 Malignant neoplasm of supraglottis: Secondary | ICD-10-CM

## 2015-06-22 DIAGNOSIS — Z510 Encounter for antineoplastic radiation therapy: Secondary | ICD-10-CM | POA: Diagnosis not present

## 2015-06-22 MED ORDER — LIDOCAINE VISCOUS 2 % MT SOLN: OROMUCOSAL | Status: DC

## 2015-06-22 MED ORDER — LARYNGOSCOPY SOLUTION RAD-ONC
15.0000 mL | Freq: Once | TOPICAL | Status: AC
  Administered 2015-06-22: 15 mL via TOPICAL
  Filled 2015-06-22: qty 15

## 2015-06-22 MED ORDER — SUCRALFATE 1 G PO TABS: ORAL_TABLET | ORAL | Status: DC

## 2015-06-22 NOTE — Progress Notes (Signed)
  Oncology Nurse Navigator Documentation  Navigator Location: [facility]-Med Onc (06/22/15 1330) Navigator Encounter Type: Initial RadOnc;Education (06/22/15 1330)   Abnormal Finding Date: 05/20/15 (06/22/15 1330) Confirmed Diagnosis Date: 06/03/15 (06/22/15 1330)     Patient Visit Type: RadOnc (06/22/15 1330) Treatment Phase: Pre-Tx/Tx Discussion (06/22/15 1330)     Interventions: Education Method (06/22/15 1330)     Education Method: Verbal;Tour (06/22/15 1330)      Met with [name] during initial consult with Dr. [name].  She was accompanied by her husband and daughter.   1. Further introduced myself as her Navigator, explained my role as a member of the Care Team.   2. Provided New Patient Information packet, discussed contents:  Contact information for physician(s), myself, other members of the Care Team.  Advance Directive information ([place] blue pamphlet with LCSW contact info).  She stated she has ADs, will bring copies next visit.  Fall Prevention Patient Safety Plan  Appointment Guideline  Financial Assistance Information sheet  [place] with highlight of [place] 3. Provided introductory explanation of radiation treatment including SIM planning and purpose of Aquaplast head and shoulder mask, showed them example.   4. Provided and discussed education handouts for PEG.  5. Discussed upcoming 5/23 H&N MDC which she agree to attend.  I provided an 0800 arrival time, explained registration process.  6. Provided a tour of SIM and [place] areas, explained treatment and arrival procedures. 7. I encouraged them to contact me with questions/concerns as treatments/procedures begin.  They verbalized understanding of information provided.    [name], RN, BSN, [place] at [place] [phone]              Time Spent with Patient: 120 (06/22/15 1330)

## 2015-06-23 DIAGNOSIS — E538 Deficiency of other specified B group vitamins: Secondary | ICD-10-CM | POA: Diagnosis not present

## 2015-06-23 DIAGNOSIS — Z1389 Encounter for screening for other disorder: Secondary | ICD-10-CM | POA: Diagnosis not present

## 2015-06-23 DIAGNOSIS — J449 Chronic obstructive pulmonary disease, unspecified: Secondary | ICD-10-CM | POA: Diagnosis not present

## 2015-06-23 DIAGNOSIS — E46 Unspecified protein-calorie malnutrition: Secondary | ICD-10-CM | POA: Diagnosis not present

## 2015-06-23 DIAGNOSIS — C321 Malignant neoplasm of supraglottis: Secondary | ICD-10-CM | POA: Diagnosis not present

## 2015-06-23 DIAGNOSIS — I10 Essential (primary) hypertension: Secondary | ICD-10-CM | POA: Diagnosis not present

## 2015-06-23 DIAGNOSIS — R42 Dizziness and giddiness: Secondary | ICD-10-CM | POA: Diagnosis not present

## 2015-06-23 DIAGNOSIS — E784 Other hyperlipidemia: Secondary | ICD-10-CM | POA: Diagnosis not present

## 2015-06-23 DIAGNOSIS — Z0000 Encounter for general adult medical examination without abnormal findings: Secondary | ICD-10-CM | POA: Diagnosis not present

## 2015-06-23 DIAGNOSIS — J9610 Chronic respiratory failure, unspecified whether with hypoxia or hypercapnia: Secondary | ICD-10-CM | POA: Diagnosis not present

## 2015-06-24 ENCOUNTER — Telehealth: Payer: Self-pay | Admitting: *Deleted

## 2015-06-24 DIAGNOSIS — C321 Malignant neoplasm of supraglottis: Secondary | ICD-10-CM | POA: Insufficient documentation

## 2015-06-24 NOTE — Telephone Encounter (Signed)
Called patient to inform of Pet Scan for 06-30-15-arrival time 8:30 am , NPO - 6 HRS. Prior to test @ [facility], spoke with patient and she is aware of this test

## 2015-06-24 NOTE — Telephone Encounter (Signed)
  Oncology Nurse Navigator Documentation  Navigator Location: [facility]Med Onc (06/24/15 1345) Navigator Encounter Type: Telephone (06/24/15 1345) Telephone: [place]ahoma Crocker Call;Appt Confirmation/Clarification (06/24/15 1345)                     Spoke with Mrs. Avila, confirmed her understanding of an 0800 arrival for next Tuesday morning's MDC despite times indicated by automated appt reminders.  She voiced understanding.  I reviewed her additional appts next week including 5/22 1300 Dr. Enrique Sack and 5/25 0830 arrival for 0900 PET at [facility].  She voiced understanding.  I encouraged her to call me with questions.  [name], RN, BSN, [place] at [place] [phone]                       Time Spent with Patient: 15 (06/24/15 1345)

## 2015-06-27 ENCOUNTER — Other Ambulatory Visit (HOSPITAL_COMMUNITY): Payer: Medicare Other | Admitting: Dentistry

## 2015-06-27 ENCOUNTER — Encounter (HOSPITAL_COMMUNITY): Payer: Self-pay | Admitting: Dentistry

## 2015-06-27 ENCOUNTER — Ambulatory Visit (HOSPITAL_COMMUNITY): Payer: Self-pay | Admitting: Dentistry

## 2015-06-27 VITALS — BP 148/58 | HR 67 | Temp 97.7°F

## 2015-06-27 DIAGNOSIS — IMO0002 Reserved for concepts with insufficient information to code with codable children: Secondary | ICD-10-CM

## 2015-06-27 DIAGNOSIS — K08409 Partial loss of teeth, unspecified cause, unspecified class: Secondary | ICD-10-CM

## 2015-06-27 DIAGNOSIS — K0530 Chronic periodontitis, unspecified: Secondary | ICD-10-CM

## 2015-06-27 DIAGNOSIS — C321 Malignant neoplasm of supraglottis: Secondary | ICD-10-CM

## 2015-06-27 DIAGNOSIS — R1310 Dysphagia, unspecified: Secondary | ICD-10-CM

## 2015-06-27 DIAGNOSIS — K036 Deposits [accretions] on teeth: Secondary | ICD-10-CM

## 2015-06-27 DIAGNOSIS — K030 Excessive attrition of teeth: Secondary | ICD-10-CM

## 2015-06-27 DIAGNOSIS — Z972 Presence of dental prosthetic device (complete) (partial): Secondary | ICD-10-CM

## 2015-06-27 DIAGNOSIS — M264 Malocclusion, unspecified: Secondary | ICD-10-CM

## 2015-06-27 DIAGNOSIS — Z01818 Encounter for other preprocedural examination: Secondary | ICD-10-CM

## 2015-06-27 DIAGNOSIS — K060 Gingival recession: Secondary | ICD-10-CM

## 2015-06-27 DIAGNOSIS — K08109 Complete loss of teeth, unspecified cause, unspecified class: Secondary | ICD-10-CM

## 2015-06-27 MED ORDER — SODIUM FLUORIDE 1.1 % DT GEL: DENTAL | Status: DC

## 2015-06-27 MED FILL — FLUORISHIELD 1.1% GEL: 1.1 % | 30 days supply | Qty: 114 | Fill #0

## 2015-06-27 NOTE — Patient Instructions (Signed)

## 2015-06-27 NOTE — Progress Notes (Signed)
DENTAL CONSULTATION  Date of Consultation:  06/27/2015 Patient Name:   Renee [name] Date of Birth:   01-Apr-1934 Medical Record Number: [id number]  VITALS: BP 148/58 mmHg  Pulse 67  Temp(Src) 97.7 F (36.5 C) (Oral)  CHIEF COMPLAINT: Patient referred by Dr. [name] for a dental consultation.   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 Patient with anticipated radiation and possible chemotherapy. Patient is now seen as part of a pre-chemoradiation therapy dental protocol examination.  The patient currently denies acute toothaches, swellings, or abscesses.  Patient was last seen by her primary dentist, Dr. Trudi Ida, approximately 4 months ago for an exam and cleaning. Patient usually is seen on an every 6 month basis. Patient also recently had an upper complete denture fabricated by Dr. Vernard Gambles. Patient indicates that the upper complete denture is stable and retentive and "fits okay".  Patient indicates that she does have some dental phobia related to previous dentist who "pulled a lot of my teeth". The patient indicates that she had most of her crown and bridge therapy and root canal therapies performed in [place] when she lived there.   PROBLEM LIST: Patient Active Problem List   Diagnosis Date Noted  . Carcinoma of epiglottis ([place]) 06/24/2015    Priority: Medium  . Chronic obstructive pulmonary disease ([place]) 03/16/2015  . Moderate COPD (chronic obstructive pulmonary disease) ([place]) 03/16/2015  . COPD with chronic bronchitis ([place]) 12/13/2014  . Bibasilar crackles 12/13/2014  . Irritated throat 12/13/2014  . Chronic respiratory failure with hypoxia ([place]) 08/18/2014  . Cough 05/19/2014  . A-fib ([place]) 05/11/2014  . Orthostatic hypotension 01/18/2014  . HLD (hyperlipidemia)   . Hypertension   . Dizziness 11/30/2013  . Paroxysmal a-fib ([place]) 12/04/2012  . Palpitations 11/23/2011  . Hypothyroidism   .  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 IBS (irritable bowel syndrome)   . Bundle branch block, right   . Coronary artery disease   . Fatigue   . Bruises easily   . Joint pain   . Anxiety   . Chronic diastolic CHF (congestive heart failure) ([place]) 12/07/2009  . COPD (chronic obstructive pulmonary disease) ([place]) 12/07/2009  . GERD 06/27/2009  . PERSONAL HX COLONIC POLYPS 06/27/2009    PMH: Past Medical History  Diagnosis Date  . IBS (irritable bowel syndrome)   . Coronary artery disease     a. CABG 09/2009. b. normal myoview in 05/2012.   [name] PAF (paroxysmal atrial fibrillation) ([place])     a. Post op after CABG. Intolerant of amio in the past. b. Recurrence - started on Tikosyn 11/2013.  [name] Dysphagia   . Hypertension   . HLD (hyperlipidemia)     can't take meds d/t joint aches/pains  . Chronic bronchitis ([place])   . Emphysema/COPD ([place])     on home O2  . Sciatic nerve pain   . Orthostatic hypotension   . Chronic respiratory failure ([place])   . Brain aneurysm   . Internal hemorrhoids   . PONV (postoperative nausea and vomiting)   . RBBB     takes Metoporolol daily  . Chronic diastolic CHF (congestive heart failure) ([facility])     was on Lasix but has been off for a few months  . Myocardial infarction [facility]) 2009    takes Eliquis daily.  . Emphysema lung ([place])   . Pneumonia     several yrs ago  . History of  vertigo     doesn't take any meds  . Arthritis   . Osteoporosis   . Chronic back pain     buldging disc  . History of colon polyps     benign  . Hypothyroidism     takes Synthroid daily  . Macular degeneration     dry  . GERD (gastroesophageal reflux disease)     takes Nexium daily  . Anxiety     takes Xanax as needed  . History of shingles     PSH: Past Surgical History  Procedure Laterality Date  . Total hip arthroplasty Right 11/01/2008  . Appendectomy  1995  . Tonsillectomy and adenoidectomy    . Joint replacement    .  Coronary artery bypass graft  09/21/2009    CABG X2  . Cardiac catheterization  1990's; 06/2009  . Cholecystectomy  2008  . Larynx surgery  ~ 1960    "tumor removed"  . Total abdominal hysterectomy  1986  . Tubal ligation  1973  . Cataract extraction w/ intraocular lens  implant, bilateral Bilateral   . Retinal laser procedure Left 10/19/2013    "had a wrinkle in it"  . Tee without cardioversion N/A 05/10/2014    Procedure: TRANSESOPHAGEAL ECHOCARDIOGRAM (TEE);  Surgeon: Sueanne Margarita, MD;  Location[facility] ENDOSCOPY;  Service: Cardiovascular;  Laterality: N/A;  . Atrial fibrillation ablation N/A 05/11/2014    Procedure: ATRIAL FIBRILLATION ABLATION;  Surgeon: Thompson Grayer, MD;  Location: [facility] CATH LAB;  Service: Cardiovascular;  Laterality: N/A;  . Colonosocpy    . Esophagogastroduodenoscopy    . Direct laryngoscopy N/A 06/03/2015    Procedure: Direct laryngoscopy with biopsy left epiglottic lesion;  Surgeon: [name], MD;  Location: [facility];  Service: ENT;  Laterality: N/A;    ALLERGIES: Allergies  Allergen Reactions  . Levofloxacin Nausea Only    Unable to tolerate more than 7 days  . Penicillins Swelling and Rash    Swelling location undefined. Has patient had a PCN reaction causing immediate rash, facial/tongue/throat swelling, SOB or lightheadedness with hypotension: Yes Has patient had a PCN reaction causing severe rash involving mucus membranes or skin necrosis: No Has patient had a PCN reaction that required hospitalization Unk Has patient had a PCN reaction occurring within the last 10 years: Unk If all of the above answers are "NO", then may proceed with Cephalosporin use.   [name] Spiriva [Tiotropium Bromide Monohydrate] Other (See Comments)    Throat irritation  . Statins Other (See Comments)    Myalgias  . Budesonide-Formoterol Fumarate Other (See Comments)    Tachycardia  . Morphine Other (See Comments)    double vision  . Phenazopyridine Hcl Other (See Comments)    *piridium*  face red  . Pneumococcal Vaccines Swelling    [place] of the skin  . Prednisone Swelling    Can't take PO but injectable is fine  . Sulfa Antibiotics Rash  . Sulfonamide Derivatives Rash    MEDICATIONS: Current Outpatient Prescriptions  Medication Sig Dispense Refill  . ALPRAZolam (XANAX) 0.25 MG tablet Take 0.25 mg by mouth 2 (two) times daily as needed for anxiety or sleep.    [name] apixaban (ELIQUIS) 5 MG TABS tablet Take 1 tablet (5 mg total) by mouth 2 (two) times daily. 60 tablet 1  . cholecalciferol (VITAMIN D) 1000 UNITS tablet Take 1,000 Units by mouth daily.    [name] esomeprazole (NEXIUM) 40 MG capsule Take 40 mg by mouth daily before breakfast.      .  ipratropium-albuterol (DUONEB) 0.5-2.5 (3) MG/3ML SOLN Take 3 mLs by nebulization 4 (four) times daily. 360 mL 11  . levothyroxine (SYNTHROID, LEVOTHROID) 25 MCG tablet Take 25 mcg by mouth daily before breakfast.     . lidocaine (XYLOCAINE) 2 % solution Mix 1 part 2%viscous lidocaine,1part H2O.Swish and/or swallow 57mL of this mixture,57min before meals and at bedtime, up to QID 100 mL 5  . metoprolol succinate (TOPROL-XL) 25 MG 24 hr tablet Take 1 tablet (25 mg total) by mouth daily.    . nitroGLYCERIN (NITROSTAT) 0.4 MG SL tablet Place 0.4 mg under the tongue every 5 (five) minutes as needed for chest pain.    . OXYGEN Inhale 2 L into the lungs as needed (for shortness of breath).     [name] Glycol-Propyl Glycol (SYSTANE OP) Place 1 drop into both eyes 2 (two) times daily.     . sodium fluoride (FLUORISHIELD) 1.1 % GEL dental gel Instill one drop of gel per tooth space of fluoride tray. Place over teeth for 5 minutes. Remove. Spit out excess. Repeat nightly. 120 mL prn  . sucralfate (CARAFATE) 1 g tablet Dissolve 1 tablet in 71mL H2O and swallow up to QID,PRN sore throat. 60 tablet 5  . traMADol (ULTRAM) 50 MG tablet Take 1 tablet (50 mg total) by mouth every 6 (six) hours as needed. Pain 30 tablet 0  . [DISCONTINUED]  budesonide-formoterol (SYMBICORT) 160-4.5 MCG/ACT inhaler Inhale 2 puffs into the lungs 2 (two) times daily. 1 Inhaler 12  . [DISCONTINUED] calcium citrate-vitamin D (CITRACAL+D) 315-200 MG-UNIT per tablet Take 1 tablet by mouth 2 (two) times daily.       No current facility-administered medications for this visit.    LABS: Lab Results  Component Value Date   WBC 6.7 06/02/2015   HGB 12.2 06/03/2015   HCT 36.0 06/03/2015   MCV 86.3 06/02/2015   PLT 462* 06/02/2015      Component Value Date/Time   NA 142 06/03/2015 0621   NA 144 01/02/2014 0000   K 3.8 06/03/2015 0621   CL 99* 06/02/2015 1133   CO2 29 06/02/2015 1133   GLUCOSE 102* 06/03/2015 0621   GLUCOSE 72 01/02/2014 0000   BUN <5* 06/02/2015 1133   BUN 13 01/02/2014 0000   CREATININE 0.95 06/02/2015 1133   CREATININE 0.96* 04/25/2015 1104   CALCIUM 8.5* 06/02/2015 1133   GFRNONAA 56* 06/02/2015 1133   GFRAA >60 06/02/2015 1133   Lab Results  Component Value Date   INR 1.22 09/21/2009   INR 0.93 09/19/2009   INR 0.96 06/30/2009   No results found for: PTT  SOCIAL HISTORY: Social History   Social History  . Marital Status: Married    Spouse Name: N/A  . Number of Children: 1  . Years of Education: N/A   Occupational History  . nurse     retired   Social History Main Topics  . Smoking status: Former Smoker    Types: Cigarettes  . Smokeless tobacco: Never Used     Comment: quit smoking 7 yrs ago  . Alcohol Use: No  . Drug Use: No  . Sexual Activity: No   Other Topics Concern  . Not on file   Social History Narrative        FAMILY HISTORY: Family History  Problem Relation Age of Onset  . Heart disease Mother   . Transient ischemic attack Mother   . Hypertension Mother   . Deep vein thrombosis Mother   . Heart attack  Father   . Hypertension Father   . Diabetes Father   . Hyperlipidemia Father   . Breast cancer Sister   . Myasthenia gravis Brother   . Brain cancer Sister     mets from  breast  . Colon polyps Brother   . Hypertension Brother   . Colon cancer Daughter   . Colon cancer Other     nephew    REVIEW OF SYSTEMS: Reviewed with the patient as per History of Present Illness. Psych: Positive for some dental phobia.  DENTAL HISTORY: CHIEF COMPLAINT: Patient referred by Dr. [name] for a dental consultation.   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 Patient with anticipated radiation and possible chemotherapy. Patient is now seen as part of a pre-chemoradiation therapy dental protocol examination.  The patient currently denies acute toothaches, swellings, or abscesses.  Patient was last seen by her primary dentist, Dr. Trudi Ida, approximately 4 months ago for an exam and cleaning. Patient usually is seen on an every 6 month basis. Patient also recently had an upper complete denture fabricated by Dr. Vernard Gambles. Patient indicates that the upper complete denture is stable and retentive and "fits okay".  Patient indicates that she does have some dental phobia related to previous dentist who "pulled a lot of my teeth". The patient indicates that she had most of her crown and bridge therapy and root canal therapies performed in [place] when she lived there.   DENTAL EXAMINATION: GENERAL: The patient is a well-developed, slightly built female in no acute distress. HEAD AND NECK: No palpable submandibular lymphadenopathy. Patient has multiple skin lesions involving the neck region without changes by patient report. The patient denies acute TMJ symptoms. INTRAORAL EXAM: Patient has normal saliva. Patient as bilateral mandibular lingual, multilobular tori. Patient has flabby tissues associated with the premaxilla. There is atrophy of the edentulous alveolar ridges. Patient has a reduced maximum interincisal opening of 30 mm. DENTITION: Patient is missing tooth numbers 1 through 17, 19, 20, 28, 30,  and 32. Lower teeth spaces are closed with crown and bridge therapy. Patient has mandibular interincisal attrition. PERIODONTAL: Patient has chronic periodontitis with plaque accumulations, gingival recession, and no significant tooth mobility. Periodontal charting was not performed secondary to need for antibiotic premedication. DENTAL CARIES/SUBOPTIMAL RESTORATIONS: No obvious dental caries are noted. Restorations appear to be acceptable. ENDODONTIC: Patient currently denies acute pulpitis symptoms. Patient has root canal therapies associated with tooth numbers 21, 22, 27, and 29. CROWN AND BRIDGE: Patient has a bridge from tooth numbers 18 through 34, and 29-31. Patient also has crowns on tooth numbers 22 and 27. PROSTHODONTIC: Patient has an upper complete denture that has acceptable retention and stability. The upper denture is aesthetic. OCCLUSION: Patient has a class II malocclusion of the upper complete denture with the lower natural dentition. The occlusion is stable at this time.  RADIOGRAPHIC INTERPRETATION: An orthopantogram was taken and supplemented with 7 lower periapical radiographs. There are multiple missing teeth. There is incipient to moderate bone loss. There is supra-eruption and drifting of the unopposed teeth into the edentulous areas. There are multiple crown or bridge restorations noted. There are multiple root canal therapies noted. No obvious dental caries are noted. No obvious periapical radiolucencies are noted. There are radiopacities involving the mandibular arch consistent with bilateral mandibular lingual tori.  ASSESSMENTS: 1. Squamous cell carcinoma of the epiglottis 2. Pre-chemoradiation therapy dental protocol 3. Chronic periodontitis with bone loss 4. Gingival  recession  5. Accretions 6. Multiple missing teeth 7. Atrophy of the edentulous alveolar ridges 8. Flabby tissues associated with the premaxilla 9. Mandibular anterior incisal attrition 10. Class II  malocclusion of the upper complete denture against lower existing natural dentition 11. Stable and retentive upper complete denture with excellent aesthetics. 12. Need for antibiotic premedication prior to invasive dental procedures due to history of total hip replacement by Dr. Alvan Dame by patient report. 13. Bilateral mandibular lingual tori 14. Risk for bleeding with invasive dental procedures due to Eloquis therapy  PLAN/RECOMMENDATIONS: 1. I discussed the risks, benefits, and complications of various treatment options with the patient in relationship to her medical and dental conditions, anticipated radiation therapy and possible chemotherapy, and chemoradiation therapy side effects to include xerostomia, radiation caries, trismus, mucositis, taste changes, gum and jawbone changes, and risk for infection and osteoradionecrosis.. We discussed various treatment options to include no treatment, extraction of teeth in the primary field radiation therapy, alveoloplasty, pre-prosthetic surgery as indicated, periodontal therapy, dental restorations, root canal therapy, crown and bridge therapy, implant therapy, and replacement of missing teeth as indicated. We also discussed fabrication of the lower scatter protection device and fluoride tray. The patient currently wishes to proceed with lower impression today for the fabrication of a scatter protection devices and fluoride tray. The patient will then follow-up with Dr. Ignatius Specking for a dental cleaning prior to start of radiation therapy. A prescription for FluoriSHIELD was sent to [facility] long outpatient pharmacy with prn refills.  2. Discussion of findings with medical team and coordination of future medical and dental care as needed.  I spent in excess of  120 minutes during the conduct of this consultation and >50% of this time involved direct face-to-face encounter for counseling and/or coordination of the patient's care.    Lenn Cal, DDS

## 2015-06-28 ENCOUNTER — Ambulatory Visit: Payer: Medicare Other | Admitting: Nutrition

## 2015-06-28 ENCOUNTER — Ambulatory Visit: Payer: Medicare Other | Attending: Radiation Oncology

## 2015-06-28 ENCOUNTER — Other Ambulatory Visit (HOSPITAL_COMMUNITY): Payer: Self-pay | Admitting: Radiation Oncology

## 2015-06-28 ENCOUNTER — Telehealth: Payer: Self-pay | Admitting: *Deleted

## 2015-06-28 ENCOUNTER — Ambulatory Visit (HOSPITAL_COMMUNITY): Payer: Self-pay | Admitting: Dentistry

## 2015-06-28 ENCOUNTER — Ambulatory Visit
Admission: RE | Admit: 2015-06-28 | Discharge: 2015-06-28 | Disposition: A | Discharge status: Home / Self Care | Payer: Medicare Other | Source: Ambulatory Visit | Attending: Radiation Oncology | Admitting: Radiation Oncology

## 2015-06-28 ENCOUNTER — Ambulatory Visit: Payer: Medicare Other | Admitting: Physical Therapy

## 2015-06-28 ENCOUNTER — Encounter (HOSPITAL_COMMUNITY): Payer: Self-pay | Admitting: Dentistry

## 2015-06-28 ENCOUNTER — Encounter: Payer: Self-pay | Admitting: *Deleted

## 2015-06-28 VITALS — BP 132/45 | HR 70 | Temp 97.7°F

## 2015-06-28 DIAGNOSIS — R1310 Dysphagia, unspecified: Secondary | ICD-10-CM | POA: Insufficient documentation

## 2015-06-28 DIAGNOSIS — C321 Malignant neoplasm of supraglottis: Secondary | ICD-10-CM

## 2015-06-28 DIAGNOSIS — Z463 Encounter for fitting and adjustment of dental prosthetic device: Secondary | ICD-10-CM

## 2015-06-28 DIAGNOSIS — R29898 Other symptoms and signs involving the musculoskeletal system: Secondary | ICD-10-CM | POA: Diagnosis not present

## 2015-06-28 DIAGNOSIS — R2689 Other abnormalities of gait and mobility: Secondary | ICD-10-CM | POA: Diagnosis not present

## 2015-06-28 DIAGNOSIS — Z01818 Encounter for other preprocedural examination: Secondary | ICD-10-CM

## 2015-06-28 NOTE — Progress Notes (Unsigned)
Head & Neck Multidisciplinary Clinic Clinical Social Work  Clinical Social Work met with patient/family at head & neck multidisciplinary clinic to offer support and assess for psychosocial needs.  Renee [name] was accompanied by her spouse, Renee [name], and daughter, Renee [name].  [name] reported she feels her distress has lowered since the distress thermometer was completed.  The patient expressed she feels she is coping with diagnosis adequately, but feels scared.  CSW explored concept of dealing with the unknown and normalized patients feelings of fear prior to beginning treatment.  The patient/family feel the information concerns have been addressed by the medical team.  Patient shared she "just doesn't want to be a burden" [to her family].  CSW facilitated discussion among patient/family regarding these feelings.  Patient's daughter is a cancer survivor and was treated at this cancer center.  Patient's daughter's concerns surrounded providing home support for her mother and importance of patient keeping a routine throughout this process.  CSW provided information on home care agencies.  ONCBCN DISTRESS SCREENING 06/22/2015  Screening Type Initial Screening  Distress experienced in past week (1-10) 7  Emotional problem type Adjusting to illness;Adjusting to appearance changes  Spiritual/Religous concerns type Facing my mortality  Information Concerns Type Lack of info about diagnosis;Lack of info about treatment;Lack of info about complementary therapy choices  Physical Problem type Pain;Nausea/vomiting;Getting around;Breathing;Mouth sores/swallowing  Physician notified of physical symptoms Yes    Clinical Social Work briefly discussed Clinical Social Work role and [employer] support programs/services.  Clinical Social Work encouraged patient to call with any additional questions or concerns.   Polo Riley, MSW, LCSW, OSW-C Clinical Social Worker [facility] [phone]

## 2015-06-28 NOTE — Therapy (Signed)
[facility] [address] [place], [place], [address] Phone: [phone]   Fax:  [phone]  Speech Language Pathology Evaluation  Patient Details  Name: Renee [name] MRN: [id number] Date of Birth: [birth year] Referring Provider: Eppie Gibson MD  Encounter Date: 06/28/2015      End of Session - 06/28/15 1033    Visit Number 1   Number of Visits 3   Date for SLP Re-Evaluation 09/09/15   SLP Start Time 0945   SLP Stop Time  1027   SLP Time Calculation (min) 42 min   Activity Tolerance Patient tolerated treatment well      Past Medical History  Diagnosis Date  . IBS (irritable bowel syndrome)   . Coronary artery disease     a. CABG 09/2009. b. normal myoview in 05/2012.   [name] PAF (paroxysmal atrial fibrillation) ([place])     a. Post op after CABG. Intolerant of amio in the past. b. Recurrence - started on Tikosyn 11/2013.  [name] Dysphagia   . Hypertension   . HLD (hyperlipidemia)     can't take meds d/t joint aches/pains  . Chronic bronchitis ([place])   . Emphysema/COPD ([place])     on home O2  . Sciatic nerve pain   . Orthostatic hypotension   . Chronic respiratory failure ([place])   . Brain aneurysm   . Internal hemorrhoids   . PONV (postoperative nausea and vomiting)   . RBBB     takes Metoporolol daily  . Chronic diastolic CHF (congestive heart failure) (HCC)     was on Lasix but has been off for a few months  . Myocardial infarction [facility]) 2009    takes Eliquis daily.  . Emphysema lung ([place])   . Pneumonia     several yrs ago  . History of vertigo     doesn't take any meds  . Arthritis   . Osteoporosis   . Chronic back pain     buldging disc  . History of colon polyps     benign  . Hypothyroidism     takes Synthroid daily  . Macular degeneration     dry  . GERD (gastroesophageal reflux disease)     takes Nexium daily  . Anxiety     takes Xanax as needed  . History of shingles     Past Surgical  History  Procedure Laterality Date  . Total hip arthroplasty Right 11/01/2008  . Appendectomy  1995  . Tonsillectomy and adenoidectomy    . Joint replacement    . Coronary artery bypass graft  09/21/2009    CABG X2  . Cardiac catheterization  1990's; 06/2009  . Cholecystectomy  2008  . Larynx surgery  ~ 1960    "tumor removed"  . Total abdominal hysterectomy  1986  . Tubal ligation  1973  . Cataract extraction w/ intraocular lens  implant, bilateral Bilateral   . Retinal laser procedure Left 10/19/2013    "had a wrinkle in it"  . Tee without cardioversion N/A 05/10/2014    Procedure: TRANSESOPHAGEAL ECHOCARDIOGRAM (TEE);  Surgeon: Sueanne Margarita, MD;  Location: [facility] ENDOSCOPY;  Service: Cardiovascular;  Laterality: N/A;  . Atrial fibrillation ablation N/A 05/11/2014    Procedure: ATRIAL FIBRILLATION ABLATION;  Surgeon: Thompson Grayer, MD;  [facility] C[facility] LAB;  Service: Cardiovascular;  Laterality: N/A;  . Colonosocpy    . Esophagogastroduodenoscopy    . Direct laryngoscopy N/A 06/03/2015    Procedure: Direct laryngoscopy with biopsy left  epiglottic lesion;  Surgeon: [name], MD;  Location: [facility];  Service: ENT;  Laterality: N/A;    There were no vitals filed for this visit.      Subjective Assessment - 06/28/15 0940    Subjective Pt reports she gets choked or throat clears once or twice each meal. PT is clearing throat and coughing throughout exam today.   Patient is accompained by: Family member  husband Ed, daughter   Currently in Pain? Yes   Pain Score 5    Pain Location Throat   Pain Orientation Right   Pain Descriptors / Indicators Constant   Aggravating Factors  eating   Pain Relieving Factors not eating            SLP Evaluation [facility] - 06/28/15 0940    SLP Visit Information   SLP Received On 06/28/15   Referring Provider Eppie Gibson MD   Onset Date "16 months ago"   Prior Functional Status   Cognitive/Linguistic Baseline Within functional limits   Cognition    Overall Cognitive Status Within Functional Limits for tasks assessed   Auditory Comprehension   Overall Auditory Comprehension Appears within functional limits for tasks assessed   Verbal Expression   Overall Verbal Expression Appears within functional limits for tasks assessed   Oral Motor/Sensory Function   Overall Oral Motor/Sensory Function Impaired   Labial ROM Within Functional Limits   Labial Symmetry Within Functional Limits   Labial Strength Reduced   Lingual ROM Within Functional Limits   Lingual Symmetry Within Functional Limits   Lingual Strength Reduced   Lingual Coordination Reduced   Motor Speech   Overall Motor Speech Appears within functional limits for tasks assessed     Pt clearing throat throughout eval. More frequent after POs. Pt currently tolerates small bites of soft diet with thin liquids with coughing once or twice per meal. Oral motor assessment revealed as above. POs: Pt ate very small bites sandwich and sips water with throat clearing. Thyroid elevation appeared diminished/absent, and swallows appeared timely. Oral residue noted as WNL. Pt's swallow deemed abnormal at this time and a modified barium swallow exam is recommended.   Because data states the risk for dysphagia during and after radiation treatment is high due to undergoing radiation tx, SLP taught pt about the possibility of reduced/limited ability for PO intake during rad tx. SLP encouraged pt to continue swallowing POs as far into rad tx as possible, even ingesting POs and/or completing HEP shortly after administration of pain meds.   SLP educated pt, husband, and daughter re: changes to swallowing musculature after rad tx, and why adherence to dysphagia HEP provided today and PO consumption was necessary to reduce muscle fibrosis following rad tx. Pt demonstrated understanding of these things to SLP.  ==============  After eval tasks, SLP then developed a HEP for pt and pt was instructed how to  perform exercises of lingual, vocal, and pharyngeal strengthening to inhibit muscular fibrosis. SLP performed each exercise and pt return demonstrated each exercise. SLP ensured pt performance was correct prior to moving on to next exercise. Pt was instructed to complete this program once until start of radiation, then 2-3 times a day until 6 months after last radiation day, then 2-3 times per week after that.                    SLP Education - 06/28/15 1031    Education provided Yes   Education Details HEP, late effects head/neck radiation  Person(s) Educated Patient;Spouse;Child(ren)   Methods Explanation;Demonstration;Handout;Tactile cues   Comprehension Verbalized understanding;Returned demonstration;Need further instruction          SLP Short Term Goals - Jul 05, 2015 1039    SLP SHORT TERM GOAL #1   Title pt will demo HEP with rare min A   Time 2   Period --  therapy visits, for all STGs   Status New   SLP SHORT TERM GOAL #2   Title pt will tell SLP why she is completing HEP   Time 2   Period --  visits   Status New          SLP Long Term Goals - 07-05-15 1040    SLP LONG TERM GOAL #1   Title pt will demo HEP with modified independnce over two sessions   Time 4   Period --  therapy sessions, for all LTGs   Status New   SLP LONG TERM GOAL #2   Title pt will tell SLP how a food journal can assist her in returning to full and least restrictive PO diet   Time 4   Period --  visits   Status New   SLP LONG TERM GOAL #3   Title pt will tell SLP 3 s/s aspiration PNA with modified independence   Time 4   Period --  visits   Status New          Plan - 2015/07/05 1034    Clinical Impression Statement Pt presents with dysphagia, 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 Pt demonstrated overt s/s pharyngeal dysphagia during the evaluation today and a modified barium swallow exam is recommended to r/o aspiration as well as to assess for a diet with  minmal aspiration risk. Pt would benefit from cont'd ST to assess proper completion of HEP as well as to assess for safety with POs.   Speech Therapy Frequency --  approx once every four weeks   Duration --  3 visits   Treatment/Interventions Aspiration precaution training;SLP instruction and feedback;Compensatory strategies;Pharyngeal strengthening exercises;Oral motor exercises;Trials of upgraded texture/liquids;Cueing hierarchy;Patient/family education  any or all will be targeted during ST sessions   Potential to Achieve Goals Good      Patient will benefit from skilled therapeutic intervention in order to improve the following deficits and impairments:   Dysphagia      G-Codes - 07-05-15 1046    Functional Assessment Tool Used noms - 5   Functional Limitations Swallowing   Swallow Current Status [id number]) At least 20 percent but less than 40 percent impaired, limited or restricted   Swallow Goal Status [id number]) At least 1 percent but less than 20 percent impaired, limited or restricted      Problem List Patient Active Problem List   Diagnosis Date Noted  . 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 Chronic obstructive pulmonary disease ([place]) 03/16/2015  . Moderate COPD (chronic obstructive pulmonary disease) ([place]) 03/16/2015  . COPD with chronic bronchitis ([place]) 12/13/2014  . Bibasilar crackles 12/13/2014  . Irritated throat 12/13/2014  . Chronic respiratory failure with hypoxia ([place]) 08/18/2014  . Cough 05/19/2014  . A-fib ([place]) 05/11/2014  . Orthostatic hypotension 01/18/2014  . HLD (hyperlipidemia)   . Hypertension   . Dizziness 11/30/2013  . Paroxysmal a-fib ([place]) 12/04/2012  . Palpitations 11/23/2011  . Hypothyroidism   . 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 IBS (irritable bowel syndrome)   . Bundle branch block, right   . Coronary  artery disease   . Fatigue   . Bruises easily   . Joint pain   . Anxiety   . Chronic  diastolic CHF (congestive heart failure) ([place]) 12/07/2009  . COPD (chronic obstructive pulmonary disease) ([place]) 12/07/2009  . GERD 06/27/2009  . PERSONAL HX COLONIC POLYPS 06/27/2009    [facility] ,MS, CCC-SLP  06/28/2015, 10:47 AM  [facility] [address] [place], [place], [address] Phone: [phone]   Fax:  [phone]  Name: SHAUNTICE ZAMPINO MRN: [id number] Date of Birth: Apr 21, 1934

## 2015-06-28 NOTE — Progress Notes (Signed)
06/28/2015  Patient Name:   Renee [name] Date of Birth:   1934/04/14 Medical Record Number: [id number]  BP 132/45 mmHg  Pulse 70  Temp(Src) 97.7 F (36.5 C) (Oral)  [name] now presents for insertion of lower fluoride tray and scatter protection device.  PROCEDURE: Appliances were tried in and adjusted as needed. [place] Trismus device was previously fabricated at 30 mm with 20 sticks. Postop instructions were provided and a written and verbal format concerning the use and care of appliances. All questions were answered. Patient to return to clinic for periodic oral examination in approximately 3-4 weeks during radiation therapy. Patient to call if questions or problems arise before then.  Lenn Cal, DDS

## 2015-06-28 NOTE — Patient Instructions (Signed)
SWALLOWING EXERCISES Do these once a day until radiation starts, then as prescribed until 6 months until the last day of radiation, then do them 2-3 days per week  1. Effortful Swallows - Press your tongue hard against the roof of your mouth, then squeeze hard with the muscles in your neck while you swallow your  saliva or a sip of water - Repeat 15-20 times, 2-3 times a day, and use whenever you eat or drink  2. [place] Swallow - swallow with your tongue sticking out - Stick tongue out and gently bite tongue with your teeth - Swallow, while holding your tongue with your teeth - Repeat 15-20 times, 2-3 times a day *use a wet spoon if your mouth gets dry*  3. Pitch Raise - Repeat "he", once per second in as high of a pitch as you can - Repeat 20 times, 2-3 times a day  4. Mendelsohn Maneuver - "half swallow" exercise - Start to swallow and squeeze tight to hold your Adam's apple up with the muscles of the throat - Hold for 5-7 seconds and then relax - Repeat 15 times, 2-3times a day *use a wet spoon if your mouth gets dry*  5. Tongue Stretch/Teeth Clean - Move your tongue around the pocket between your gums and teeth, clockwise and then counter-clockwise - Repeat on the back side, clockwise and then counter-clockwise - Repeat 15-20 times, 3-4 times a day  6. Breath Hold - Say "HUH!" loudly, holding your breath for 3 seconds at your voice box - Repeat 20 times, 2-3 times a day  7. Chin pushback - Open your mouth  - Place your fist UNDER your chin near your neck, and push back with your fist for 5-7 seconds - Repeat 8-10 times, 2-3 times a day

## 2015-06-28 NOTE — Telephone Encounter (Signed)
Called patient to inform of MBSS for 07-01-15 - arrival time - 12:45 pm @ [facility], patient to report to [facility] Admitting, lvm  for a return call.

## 2015-06-28 NOTE — Therapy (Signed)
[place], [place], [address] Phone: [phone]   Fax:  [phone]  Physical Therapy Evaluation  Patient Details  Name: Renee [name] MRN: [id number] Date of Birth: 1934/11/03 Referring Provider: Dr. Eppie Gibson  Encounter Date: 06/28/2015      PT End of Session - 06/28/15 0901    Visit Number 1   Number of Visits 1   PT Start Time 0815   PT Stop Time 0845   PT Time Calculation (min) 30 min   Activity Tolerance Patient tolerated treatment well   Behavior During Therapy [facility] for tasks assessed/performed      Past Medical History  Diagnosis Date  . IBS (irritable bowel syndrome)   . Coronary artery disease     a. CABG 09/2009. b. normal myoview in 05/2012.   [name] PAF (paroxysmal atrial fibrillation) ([place])     a. Post op after CABG. Intolerant of amio in the past. b. Recurrence - started on Tikosyn 11/2013.  [name] Dysphagia   . Hypertension   . HLD (hyperlipidemia)     can't take meds d/t joint aches/pains  . Chronic bronchitis ([place])   . Emphysema/COPD ([place])     on home O2  . Sciatic nerve pain   . Orthostatic hypotension   . Chronic respiratory failure ([place])   . Brain aneurysm   . Internal hemorrhoids   . PONV (postoperative nausea and vomiting)   . RBBB     takes Metoporolol daily  . Chronic diastolic CHF (congestive heart failure) (HCC)     was on Lasix but has been off for a few months  . Myocardial infarction [facility]) 2009    takes Eliquis daily.  . Emphysema lung ([place])   . Pneumonia     several yrs ago  . History of vertigo     doesn't take any meds  . Arthritis   . Osteoporosis   . Chronic back pain     buldging disc  . History of colon polyps     benign  . Hypothyroidism     takes Synthroid daily  . Macular degeneration     dry  . GERD (gastroesophageal reflux disease)     takes Nexium daily  . Anxiety     takes Xanax as needed  . History of shingles     Past Surgical  History  Procedure Laterality Date  . Total hip arthroplasty Right 11/01/2008  . Appendectomy  1995  . Tonsillectomy and adenoidectomy    . Joint replacement    . Coronary artery bypass graft  09/21/2009    CABG X2  . Cardiac catheterization  1990's; 06/2009  . Cholecystectomy  2008  . Larynx surgery  ~ 1960    "tumor removed"  . Total abdominal hysterectomy  1986  . Tubal ligation  1973  . Cataract extraction w/ intraocular lens  implant, bilateral Bilateral   . Retinal laser procedure Left 10/19/2013    "had a wrinkle in it"  . Tee without cardioversion N/A 05/10/2014    Procedure: TRANSESOPHAGEAL ECHOCARDIOGRAM (TEE);  Surgeon: Sueanne Margarita, MD;  Location: [facility] ENDOSCOPY;  Service: Cardiovascular;  Laterality: N/A;  . Atrial fibrillation ablation N/A 05/11/2014    Procedure: ATRIAL FIBRILLATION ABLATION;  Surgeon: Thompson Grayer, MD;  Location: [facility] CATH LAB;  Service: Cardiovascular;  Laterality: N/A;  . Colonosocpy    . Esophagogastroduodenoscopy    . Direct laryngoscopy N/A 06/03/2015    Procedure: Direct laryngoscopy with biopsy left  epiglottic lesion;  Surgeon: [name], MD;  Location: [facility];  Service: ENT;  Laterality: N/A;    There were no vitals filed for this visit.       Subjective Assessment - 06/28/15 0846    Subjective Gets short of breath with walking a distance.   Patient is accompained by: Family member  husband and daughter   Pertinent History Pt. 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  Has lost 25 lbs.  h/o right THA 6-7 years ago; CABG, A fib ablation, HTN, chronic backpain, emphysema, h/o vertigo episode, orthostatic hypotension.   Patient Stated Goals get info from all clinic providers   Currently in Pain? Yes   Pain Score 5    Pain Location Throat   Pain Descriptors / Indicators Constant   Aggravating Factors  eating   Pain Relieving Factors not eating            [facility] PT  Assessment - 06/28/15 0001    Assessment   Medical Diagnosis squamous cell carcinoma of glottis   Referring Provider Dr. Eppie Gibson   Precautions   Precautions Fall;Other (comment)   Precaution Comments cancer precautions   Restrictions   Weight Bearing Restrictions No   Balance Screen   Has the patient fallen in the past 6 months No  but has fallen in past   Has the patient had a decrease in activity level because of a fear of falling?  No  but is cautious   Is the patient reluctant to leave their home because of a fear of falling?  No   Home Ecologist residence   Living Arrangements Spouse/significant other   Type of [place] to enter   Entrance Stairs-Number of Steps 3   [place] One level   Prior Function   Level of Independence Independent   Leisure no regular exercise   Cognition   Overall Cognitive Status Within Functional Limits for tasks assessed   Observation/Other Assessments   Observations well looking, thin older woman  says she uses O2 at night   Functional Tests   Functional tests Sit to Stand   Sit to Stand   Comments 8 times in 30 seconds, low for age, but patient was surprised she could do it at all    Posture/Postural Control   Posture/Postural Control Postural limitations   Postural Limitations Forward head   ROM / Strength   AROM / PROM / Strength AROM;Strength   AROM   Overall AROM Comments shoulders with slight limitations left > right, but functional   AROM Assessment Site Cervical   Cervical Flexion WFL   Cervical Extension 75% limited   Cervical - Right Side Bend 25% loss   Cervical - Left Side Bend 50% loss   Cervical - Right Rotation 25% loss   Cervical - Left Rotation 25% loss   Strength   Overall Strength Comments shoulder strength grossly 4 to 4+/5 bilat.   Ambulation/Gait   Ambulation/Gait Yes   Ambulation/Gait Assistance 6: Modified independent (Device/Increase  time)  not assessed; by patient report   Assistive device Rolling walker  though it's in car today; uses wheelchair forlonger distance   Stairs Yes   Stairs Assistance 6: Modified independent (Device/Increase time)  per patient report   Stair Management Technique Two rails  per patient report  LYMPHEDEMA/ONCOLOGY QUESTIONNAIRE - 06/28/15 0857    Type   Cancer Type glottis squamous cell carcinoma   Lymphedema Assessments   Lymphedema Assessments Head and Neck   Head and Neck   4 cm superior to sternal notch around neck 32.5 cm   6 cm superior to sternal notch around neck 32.5 cm   8 cm superior to sternal notch around neck 33.7 cm                        PT Education - 06/28/15 0900    Education provided Yes   Education Details neck ROM, posture, breathing, walking or other exercise, Cure article on staying active through treatment, lymphedema and therapy info   Person(s) Educated Patient;Spouse;Child(ren)   Methods Explanation;Handout   Comprehension Verbalized understanding                 Head and Neck Clinic Goals - 06/28/15 0908    Patient will be able to verbalize understanding of a home exercise program for cervical range of motion, posture, and walking.    Status Achieved   Patient will be able to verbalize understanding of proper sitting and standing posture.    Status Achieved   Patient will be able to verbalize understanding of lymphedema risk and availability of treatment for this condition.    Status Achieved           Plan - 06/28/15 0901    Clinical Impression Statement Patient who is diagnosed with squamous cell carcinoma of glottis and expects to have XRT to treat it.  She has significantly limited neck ROM to start, and slight forward head posture.  She was able to sit to stand 8 times in 30 seconds, and surprised her that she could do it at all. Shoulder strength is grossly 4 to 4+/5.  She has  throat pain.  She reports limited gait distance so that she uses a wheelchair for distance and walker at times as well. She uses O2 at night, with h/o emphysema and CABG.  She had a right THA 6-7 years ago.   Rehab Potential Good   PT Frequency One time visit   PT Treatment/Interventions Patient/family education   PT Next Visit Plan None at this time; patient may need therapy going forward if lymphedema or further deconditioning develops with treatment.   PT Home Exercise Plan see education section   Consulted and Agree with Plan of Care Patient      Patient will benefit from skilled therapeutic intervention in order to improve the following deficits and impairments:  Decreased range of motion, Decreased knowledge of precautions, Cardiopulmonary status limiting activity, Decreased endurance, Decreased strength  Visit Diagnosis: Other symptoms and signs involving the musculoskeletal system - Plan: PT plan of care cert/re-cert  Other abnormalities of gait and mobility - Plan: PT plan of care cert/re-cert      G-Codes - [id number]    Functional Assessment Tool Used clinical judgement   Functional Limitation Mobility: Walking and moving around   Mobility: Walking and Moving Around Current Status [phone]) At least 40 percent but less than 60 percent impaired, limited or restricted   Mobility: Walking and Moving Around Goal Status [phone]) At least 40 percent but less than 60 percent impaired, limited or restricted   Mobility: Walking and Moving Around Discharge Status [phone]) At least 40 percent but less than 60 percent impaired, limited or restricted       Problem List Patient Active  Problem List   Diagnosis Date Noted  . 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 Chronic obstructive pulmonary disease ([place]) 03/16/2015  . Moderate COPD (chronic obstructive pulmonary disease) ([place]) 03/16/2015  . COPD with chronic bronchitis ([place]) 12/13/2014  . Bibasilar crackles 12/13/2014  .  Irritated throat 12/13/2014  . Chronic respiratory failure with hypoxia ([place]) 08/18/2014  . Cough 05/19/2014  . A-fib ([place]) 05/11/2014  . Orthostatic hypotension 01/18/2014  . HLD (hyperlipidemia)   . Hypertension   . Dizziness 11/30/2013  . Paroxysmal a-fib ([place]) 12/04/2012  . Palpitations 11/23/2011  . Hypothyroidism   . 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 IBS (irritable bowel syndrome)   . Bundle branch block, right   . Coronary artery disease   . Fatigue   . Bruises easily   . Joint pain   . Anxiety   . Chronic diastolic CHF (congestive heart failure) ([place]) 12/07/2009  . COPD (chronic obstructive pulmonary disease) ([place]) 12/07/2009  . GERD 06/27/2009  . PERSONAL HX COLONIC POLYPS 06/27/2009    Allean Montfort 06/28/2015, 9:11 AM  [place], [place], [address] Phone: [phone]   Fax:  [phone]  Name: GEARLDEAN MCCURTY MRN: [id number] Date of Birth: [birth year]   Serafina Royals, PT 06/28/2015 9:11 AM

## 2015-06-28 NOTE — Patient Instructions (Signed)

## 2015-06-28 NOTE — Telephone Encounter (Signed)
CALLED PATIENT TO INFORM OF MBS ON 07-01-15- ARRIVAL TIME - 12:45 PM, NO RESTRICTIONS TO TEST, ARRIVAL - [facility] ADMITTING , SPOKE WITH PATIENT AND SHE IS AWARE OF THIS TEST.

## 2015-06-28 NOTE — Progress Notes (Signed)
Patient was seen in head and neck clinic.  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 She is a patient of Dr. [name].  Past medical history includes IBS, CAD, dysphasia, hypertension, hyperlipidemia, COPD, CHF, MI, osteoporosis, and GERD.  Medications include Xanax, vitamin D, Nexium, Synthroid, and Carafate.  Labs were reviewed.  Height: 66 inches. Weight: 132.5 pounds May 23. Usual body weight: 162 pounds in April 2016. BMI: 21.4.  Estimated nutrition needs: 1900-2100 calories, 78-90 grams protein, 2.1 L fluid.  Patient admits to painful and difficulty swallowing. Reports she has lost approximately 30 pounds but is pleased she has regained 3 of those pounds. Adamantly reports she does not want a feeding tube but understands this may become a necessity if she is unable to swallow throughout treatment.  Nutrition diagnosis: Inadequate oral intake related to cancer of the epiglottis as evidenced by 18% weight loss from usual body weight.  Intervention: Patient educated to consume 3 meals +3 snacks daily focusing on high-calorie, high-protein foods. Stressed importance of minimizing weight loss throughout treatment. Discussed the role of protein for healing. Provided education on strategies for dealing with thick saliva and dry mouth. Brief education provided on feeding tube placement.  Questions were answered Fact sheets were provided.  Teach back method used.  Monitoring, evaluation, goals: Patient will tolerate adequate calories and protein to minimize weight loss.  Next visit: To be scheduled weekly with treatment.  **Disclaimer: This note was dictated with voice recognition software. Similar sounding words can inadvertently be transcribed and this note may contain transcription errors which may not have been corrected upon publication of note.**

## 2015-06-30 ENCOUNTER — Encounter (HOSPITAL_COMMUNITY)
Admission: RE | Admit: 2015-06-30 | Discharge: 2015-06-30 | Disposition: A | Discharge status: Home / Self Care | Payer: Medicare Other | Source: Ambulatory Visit | Attending: Radiation Oncology | Admitting: Radiation Oncology

## 2015-06-30 DIAGNOSIS — C320 Malignant neoplasm of glottis: Secondary | ICD-10-CM | POA: Diagnosis not present

## 2015-06-30 DIAGNOSIS — C321 Malignant neoplasm of supraglottis: Secondary | ICD-10-CM | POA: Diagnosis not present

## 2015-06-30 LAB — GLUCOSE, CAPILLARY: Glucose-Capillary: 104 mg/dL — ABNORMAL HIGH (ref 65–99)

## 2015-06-30 MED ORDER — FLUDEOXYGLUCOSE F - 18 (FDG) INJECTION
6.6000 | Freq: Once | INTRAVENOUS | Status: AC | PRN
  Administered 2015-06-30: 6.6 via INTRAVENOUS

## 2015-06-30 NOTE — Progress Notes (Signed)
  Oncology Nurse Navigator Documentation  Navigator Location: [facility] (06/28/15 0867) Navigator Encounter Type: Clinic/MDC (06/28/15 0755)                   Met with [name] during H&N [place].   She was accompanied by her husband and daughter.   Arrived her to Nursing, provided verbal and written overview of [place], the clinicians who will be seeing her, encouraged her to ask questions during their time with her.  She was was seen by SLP, Nutrition, PT and LCSW.  Spoke with her at end of [facility], addressed questions.   She understands she can contact me needs/concerns as she proceeds with pre-XRT appointments.  [name], RN, BSN, [place] at [place] [phone]                             Time Spent with Patient: 60 (06/28/15 0755)

## 2015-07-01 ENCOUNTER — Ambulatory Visit (HOSPITAL_COMMUNITY)
Admission: RE | Admit: 2015-07-01 | Discharge: 2015-07-01 | Disposition: A | Discharge status: Home / Self Care | Payer: Medicare Other | Source: Ambulatory Visit | Attending: Radiation Oncology | Admitting: Radiation Oncology

## 2015-07-01 DIAGNOSIS — C321 Malignant neoplasm of supraglottis: Secondary | ICD-10-CM

## 2015-07-01 DIAGNOSIS — C320 Malignant neoplasm of glottis: Secondary | ICD-10-CM | POA: Diagnosis not present

## 2015-07-01 DIAGNOSIS — R1310 Dysphagia, unspecified: Secondary | ICD-10-CM | POA: Diagnosis not present

## 2015-07-06 ENCOUNTER — Telehealth: Payer: Self-pay | Admitting: *Deleted

## 2015-07-06 NOTE — Telephone Encounter (Signed)
A user error has taken place: encounter opened in error, closed for administrative reasons.

## 2015-07-06 NOTE — Telephone Encounter (Signed)
  Oncology Nurse Navigator Documentation  Navigator Location: [facility] (07/06/15 1436) Navigator Encounter Type: Telephone (07/06/15 1436) Telephone: Outgoing Call;Diagnostic Results (07/06/15 1436)                     Per Dr. Sondra Come, I called Renee [name] to inform her of 5/25 PET results, indicating scan noted localized cancer as previously determined, no evidence of distant metastatic disease.  She expressed relief, appreciation for information.  I confirmed her understanding of Friday's CT Simulation beginning with 2:15 IV Start.  I encouraged her to arrive to Radiation Waiting by 2:00, she voiced understanding.  [name], RN, BSN, [place] at [place] [phone]                       Time Spent with Patient: 15 (07/06/15 1436)

## 2015-07-08 ENCOUNTER — Encounter: Payer: Self-pay | Admitting: *Deleted

## 2015-07-08 ENCOUNTER — Ambulatory Visit
Admission: RE | Admit: 2015-07-08 | Discharge: 2015-07-08 | Disposition: A | Discharge status: Home / Self Care | Payer: Medicare Other | Source: Ambulatory Visit | Attending: Radiation Oncology | Admitting: Radiation Oncology

## 2015-07-08 ENCOUNTER — Other Ambulatory Visit: Payer: Self-pay | Admitting: Radiation Oncology

## 2015-07-08 VITALS — BP 126/83 | HR 81 | Temp 97.8°F | Ht 66.0 in | Wt 129.1 lb

## 2015-07-08 DIAGNOSIS — C321 Malignant neoplasm of supraglottis: Secondary | ICD-10-CM | POA: Diagnosis not present

## 2015-07-08 DIAGNOSIS — Z510 Encounter for antineoplastic radiation therapy: Secondary | ICD-10-CM | POA: Diagnosis not present

## 2015-07-08 DIAGNOSIS — Z87891 Personal history of nicotine dependence: Secondary | ICD-10-CM | POA: Diagnosis not present

## 2015-07-08 DIAGNOSIS — C320 Malignant neoplasm of glottis: Secondary | ICD-10-CM | POA: Diagnosis not present

## 2015-07-08 MED ORDER — SODIUM CHLORIDE 0.9% FLUSH
10.0000 mL | Freq: Once | INTRAVENOUS | Status: AC
  Administered 2015-07-08: 10 mL via INTRAVENOUS

## 2015-07-08 NOTE — Progress Notes (Signed)
Does patient have an allergy to IV contrast dye?: No.   Has patient ever received premedication for IV contrast dye?: No.   Does patient take metformin?: No.  If patient does take metformin when was the last dose: N/A  Date of lab work: 06/02/15 BUN: <5 CR: 0.95  IV site: Right Antecubital  BP 126/83 mmHg  Pulse 81  Temp(Src) 97.8 F (36.6 C)  Ht 5\' 6"  (1.676 m)  Wt 129 lb 1.6 oz (58.559 kg)  BMI 20.85 kg/m2  SpO2 97%

## 2015-07-13 DIAGNOSIS — C320 Malignant neoplasm of glottis: Secondary | ICD-10-CM | POA: Diagnosis not present

## 2015-07-13 DIAGNOSIS — Z87891 Personal history of nicotine dependence: Secondary | ICD-10-CM | POA: Diagnosis not present

## 2015-07-13 DIAGNOSIS — C321 Malignant neoplasm of supraglottis: Secondary | ICD-10-CM | POA: Diagnosis not present

## 2015-07-13 DIAGNOSIS — Z510 Encounter for antineoplastic radiation therapy: Secondary | ICD-10-CM | POA: Diagnosis not present

## 2015-07-13 NOTE — Progress Notes (Signed)
  Oncology Nurse Navigator Documentation  Navigator Location: [facility] (07/08/15 1415) Navigator Encounter Type: Treatment (07/08/15 1415)             Treatment Phase: CT SIM (07/08/15 1415)       To provide support, encouragement and care continuity, met with [name] during her [facility].  She was accompanied by her husband. She tolerated procedure without difficulty. They understand I can be contacted with questions/concerns.  [name], RN, BSN, [place] at [place] [phone]                          Time Spent with Patient: 45 (07/08/15 1415)

## 2015-07-13 NOTE — Progress Notes (Signed)
Head and Neck Cancer Simulation, IMRT treatment planning note Outpatient  Diagnosis:    ICD-9-CM ICD-10-CM   1. Carcinoma of epiglottis ([place]) 161.1 C32.1     The patient was taken to the CT simulator and laid in the supine position on the table. An Aquaplast head and shoulder mask was custom fitted to the patient's anatomy. High-resolution CT axial imaging was obtained of the head and neck with contrast. I verified that the quality of the imaging is good for treatment planning. 1 Medically Necessary Treatment Device was fabricated and supervised by me: Aquaplast mask.   Treatment planning note I plan to treat the patient with IMRT. I plan to treat the patient's tumor and bilateral neck nodes. I plan to treat to a total dose of 70 Gray in 35  fractions. Dose calculation was ordered from dosimetry.  IMRT planning Note  IMRT is an important modality to deliver adequate dose to the patient's at risk tissues while sparing the patient's normal structures, including the: esophagus, parotid tissue, mandible, brain stem, spinal cord, oral cavity, brachial plexus.  This justifies the use of IMRT in the patient's treatment.   Of note: Special treatment procedure cancelled; patient will not receive chemotherapy.   -----------------------------------  Eppie Gibson, MD

## 2015-07-14 DIAGNOSIS — Z87891 Personal history of nicotine dependence: Secondary | ICD-10-CM | POA: Diagnosis not present

## 2015-07-14 DIAGNOSIS — Z510 Encounter for antineoplastic radiation therapy: Secondary | ICD-10-CM | POA: Diagnosis not present

## 2015-07-14 DIAGNOSIS — C320 Malignant neoplasm of glottis: Secondary | ICD-10-CM | POA: Diagnosis not present

## 2015-07-14 DIAGNOSIS — C321 Malignant neoplasm of supraglottis: Secondary | ICD-10-CM | POA: Diagnosis not present

## 2015-07-15 ENCOUNTER — Ambulatory Visit: Payer: Self-pay | Admitting: Hematology and Oncology

## 2015-07-16 NOTE — Progress Notes (Signed)
A user error has taken place: encounter opened in error, closed for administrative reasons.

## 2015-07-18 ENCOUNTER — Telehealth: Payer: Self-pay | Admitting: *Deleted

## 2015-07-18 NOTE — Telephone Encounter (Signed)
  Oncology Nurse Navigator Documentation  Navigator Location: [facility] (07/18/15 [id number]) Navigator Encounter Type: Telephone (07/18/15 [id number]) Telephone: Appt Confirmation/Clarification (07/18/15 [id number])                     Received call from [name], provided clarification for tomorrow's New Start Tomotherapy, encouraged her to arrive at 1520 for 1540 appt.  She voiced understanding.  [name], RN, BSN, [place] at [place] [phone]                       Time Spent with Patient: 15 (07/18/15 0853)

## 2015-07-19 ENCOUNTER — Ambulatory Visit: Admission: RE | Admit: 2015-07-19 | Payer: Medicare Other | Source: Ambulatory Visit

## 2015-07-19 ENCOUNTER — Ambulatory Visit
Admission: RE | Admit: 2015-07-19 | Discharge: 2015-07-19 | Disposition: A | Discharge status: Home / Self Care | Payer: Medicare Other | Source: Ambulatory Visit | Attending: Radiation Oncology | Admitting: Radiation Oncology

## 2015-07-19 DIAGNOSIS — C321 Malignant neoplasm of supraglottis: Secondary | ICD-10-CM

## 2015-07-19 DIAGNOSIS — Z510 Encounter for antineoplastic radiation therapy: Secondary | ICD-10-CM | POA: Diagnosis not present

## 2015-07-19 DIAGNOSIS — Z87891 Personal history of nicotine dependence: Secondary | ICD-10-CM | POA: Diagnosis not present

## 2015-07-19 DIAGNOSIS — C320 Malignant neoplasm of glottis: Secondary | ICD-10-CM | POA: Diagnosis not present

## 2015-07-20 ENCOUNTER — Ambulatory Visit
Admission: RE | Admit: 2015-07-20 | Discharge: 2015-07-20 | Disposition: A | Discharge status: Home / Self Care | Payer: Medicare Other | Source: Ambulatory Visit | Attending: Radiation Oncology | Admitting: Radiation Oncology

## 2015-07-20 DIAGNOSIS — Z510 Encounter for antineoplastic radiation therapy: Secondary | ICD-10-CM | POA: Diagnosis not present

## 2015-07-20 DIAGNOSIS — C321 Malignant neoplasm of supraglottis: Secondary | ICD-10-CM | POA: Diagnosis not present

## 2015-07-20 DIAGNOSIS — C320 Malignant neoplasm of glottis: Secondary | ICD-10-CM | POA: Diagnosis not present

## 2015-07-20 DIAGNOSIS — Z87891 Personal history of nicotine dependence: Secondary | ICD-10-CM | POA: Diagnosis not present

## 2015-07-20 MED ORDER — SONAFINE EX EMUL
1.0000 "application " | Freq: Once | CUTANEOUS | Status: AC
  Administered 2015-07-20: 1 via TOPICAL

## 2015-07-20 NOTE — Progress Notes (Signed)
Pt here for patient teaching.  Pt given Radiation and You booklet, Managing Acute Radiation Side Effects for Head and Neck Cancer handout, skin care instructions and Sonafine. Pt reports they have not watched the Radiation Therapy Education video, but were given the link to watch at home.  Reviewed areas of pertinence such as fatigue, mouth changes, skin changes, throat changes, breast swelling, cough, shortness of breath, earaches and taste changes . Pt able to give teach back of to pat skin, use unscented/gentle soap and drink plenty of water,apply Sonafine bid and avoid applying anything to skin within 4 hours of treatment. Pt verbalizes understanding of information given and will contact nursing with any questions or concerns.     Http://rtanswers.org/treatmentinformation/whattoexpect/index  Managing Acute Radiation Side Effects for Head and Neck Cancer  Skin irritation:  . Sonafine  Topical Emulsion: First-line topical cream to help soothe skin irritation.  Apply to skin in radiation fields at least 4 hours before radiotherapy, or any time after treatments during the rest of the day.  . Triple Antibiotic Ointment (Neosporin): Apply to areas of skin with moist breakdown to prevent infection.  . 1% hydrocortisone cream: Apply to areas of skin that are itching, up to three times a day.  Arnetha Massy (Silver Sulfadiazine): Used in select cases if large patches of skin develop moist breakdown (let physician or nurse know if you have a "sulfa" drug allergy)  Soreness in mouth or throat: . Baking Soda Rinse: a home remedy to soothe/cleanse mouth and loosen thick saliva.  Mix 1/2 teaspoon salt, 1/2 teaspoon baking soda, 1 pint water (16 oz or two cups).  Swish, gargle and spit as needed to soothe/cleanse mouth. Use as often as you want.  . Sucralfate (Carafate): coats throat to soothe it before meals or any time of day. Crush 1 tablet in 10 mL H20 and swallow up to four times a day.  . 2% viscous  Lidocaine (Magic Mouth Wash): Soothes mouth and/or throat by numbing your mucous membranes. Mix 1 part 2% viscous lidocaine (Magic Mouth Wash), 1 part H20. Swish and/or swallow 66mL of this mixture, 71min before meals and at bedtime, up to four times a day. Alternate with Sucralfate (Carafate).  . Narcotics: Various short acting and long acting narcotics can be prescribed.  Often, medical oncology will prescribe these if you are receiving chemotherapy concurrently. Narcotics may cause constipation. It may be helpful to take a stool softener (Docusate Sodium) or gentle laxative (ie Senna or Polyethylene Glycol) to prevent constipation.  Having food in your stomach before ingesting a narcotic may reduce risk of stomach upset.  Thick Saliva: . Baking Soda Rinse: a home remedy to soothe/cleanse mouth and loosen thick saliva.  Mix 1/2 teaspoon salt, 1/2 teaspoon baking soda, 1 pint water (16 oz or two cups).  Swish, gargle, and spit as needed to soothe/cleanse mouth. Use as often as you want.  . Some patients find Diet Ginger Ale or Papaya Juice to be helpful.  . In extreme cases, your physician may consider prescribing a Scopolamine transdermal patch which dries up your saliva.     Poor taste, or lack of taste:   . There are no well-established medications to combat taste bud changes from radiotherapy.  It often takes weeks to months to regain taste function.  Eating bland foods and drinking nutritional shakes  may help you maintain your weight when food is not enjoyable.  Some patients supplement their oral intake with a feeding tube.  Fatigue and weakness: .  There is not a well-established safe and effective medication to combat radiation-induced fatigue.  However, if you are able to perform light exercise (such as a daily walk, yoga, recumbent stationary bicycling), this may combat fatigue and help you maintain muscle mass during treatment.  . Maintaining hydration and nutrition are also important.   If you have not been referred to a nutritionist and would like a referral, please let your nurse or physician know.  . Try to get at least 8 hours of sleep each night. You may need a daily nap, but try not to nap so late that it interferes with your nightly sleep schedule.

## 2015-07-20 NOTE — Progress Notes (Signed)
IMRT Device Note    ICD-9-CM ICD-10-CM   1. Carcinoma of epiglottis ([facility]) 161.1 C32.1     9.5 delivered field widths represent one set of IMRT treatment devices. The code is [phone].  -----------------------------------  Eppie Gibson, MD

## 2015-07-21 ENCOUNTER — Ambulatory Visit
Admission: RE | Admit: 2015-07-21 | Discharge: 2015-07-21 | Disposition: A | Discharge status: Home / Self Care | Payer: Medicare Other | Source: Ambulatory Visit | Attending: Radiation Oncology | Admitting: Radiation Oncology

## 2015-07-21 ENCOUNTER — Inpatient Hospital Stay: Admission: RE | Admit: 2015-07-21 | Payer: Self-pay | Source: Ambulatory Visit

## 2015-07-21 ENCOUNTER — Encounter: Payer: Self-pay | Admitting: *Deleted

## 2015-07-21 DIAGNOSIS — C320 Malignant neoplasm of glottis: Secondary | ICD-10-CM | POA: Diagnosis not present

## 2015-07-21 DIAGNOSIS — C321 Malignant neoplasm of supraglottis: Secondary | ICD-10-CM | POA: Diagnosis not present

## 2015-07-21 DIAGNOSIS — Z510 Encounter for antineoplastic radiation therapy: Secondary | ICD-10-CM | POA: Diagnosis not present

## 2015-07-21 DIAGNOSIS — Z87891 Personal history of nicotine dependence: Secondary | ICD-10-CM | POA: Diagnosis not present

## 2015-07-22 ENCOUNTER — Telehealth: Payer: Self-pay | Admitting: Internal Medicine

## 2015-07-22 ENCOUNTER — Ambulatory Visit
Admission: RE | Admit: 2015-07-22 | Discharge: 2015-07-22 | Disposition: A | Discharge status: Home / Self Care | Payer: Medicare Other | Source: Ambulatory Visit | Attending: Radiation Oncology | Admitting: Radiation Oncology

## 2015-07-22 DIAGNOSIS — C321 Malignant neoplasm of supraglottis: Secondary | ICD-10-CM | POA: Diagnosis not present

## 2015-07-22 DIAGNOSIS — C320 Malignant neoplasm of glottis: Secondary | ICD-10-CM | POA: Diagnosis not present

## 2015-07-22 DIAGNOSIS — Z87891 Personal history of nicotine dependence: Secondary | ICD-10-CM | POA: Diagnosis not present

## 2015-07-22 DIAGNOSIS — Z510 Encounter for antineoplastic radiation therapy: Secondary | ICD-10-CM | POA: Diagnosis not present

## 2015-07-22 NOTE — Progress Notes (Signed)
  Oncology Nurse Navigator Documentation  Navigator Location: [facility] (07/21/15 1440) Navigator Encounter Type: Treatment (07/21/15 1440)         Treatment Initiated Date: 07/19/15 (07/21/15 1440) Patient Visit Type: RadOnc (07/21/15 1440) Treatment Phase: Treatment (07/21/15 1440) Barriers/Navigation Needs: Education (07/21/15 1440)       Met with Renee [name] following her Tomo RT to check on her well being since initiation of treatment on Tuesday.   She reported she is managing tms without difficulty. I answered her questions re area being treated, expected SEs including throat soarness, skin irritation.  She understands to start BID application of Sonafine. She understands to call me with concerns/needs.  [name], RN, BSN, [place] at [place] [phone]             Acuity: Level 2 (07/21/15 1440)         Time Spent with Patient: 30 (07/21/15 1440)

## 2015-07-24 NOTE — Progress Notes (Signed)
Patient ID: Renee [name], female   DOB: 09-Dec-1934, 81 y.o.   MRN: [id number]    Cardiology Office Note Date:  07/24/2015  Patient ID:  Renee [name], Renee 02/19/1934, MRN [id number] PCP:  [name], MD  Cardiologist:  Dr. [place] Pulmonary: Dr. Iran Planas Electrophysiologist: Dr. Caryl Comes  **refresh   Chief Complaint: **  History of Present Illness: Renee [name] is a 81 y.o. 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  She comes today to be seen for Dr. Caryl Comes, she was last seen by him and cleared for surgery 06/03/15 doing well from an EP standpoint at that time.  ** bleeding  AFib Hx: Diagnosed 2011 Intolerant of up-titration oc CCB with edema AFib ablation 05/11/14, Dr. Rayann Heman May 2016 c/o "AF episodes" wore a monitor noting APCs/VPCs Hx of failing amiodarone (pulmonary)  Tikosyn discontinued post ablation  Past Medical History  Diagnosis Date  . IBS (irritable bowel syndrome)   . Coronary artery disease     a. CABG 09/2009. b. normal myoview in 05/2012.   [name] PAF (paroxysmal atrial fibrillation) ([place])     a. Post op after CABG. Intolerant of amio in the past. b. Recurrence - started on Tikosyn 11/2013.  [name] Dysphagia   . Hypertension   . HLD (hyperlipidemia)     can't take meds d/t joint aches/pains  . Chronic bronchitis ([place])   . Emphysema/COPD ([place])     on home O2  . Sciatic nerve pain   . Orthostatic hypotension   . Chronic respiratory failure ([place])   . Brain aneurysm   . Internal hemorrhoids   . PONV (postoperative nausea and vomiting)   . RBBB     takes Metoporolol daily  . Chronic diastolic CHF (congestive heart failure) ([facility])     was on Lasix but has been off for a few months  . Myocardial infarction [facility]) 2009    takes Eliquis daily.  . Emphysema lung  ([place])   . Pneumonia     several yrs ago  . History of vertigo     doesn't take any meds  . Arthritis   . Osteoporosis   . Chronic back pain     buldging disc  . History of colon polyps     benign  . Hypothyroidism     takes Synthroid daily  . Macular degeneration     dry  . GERD (gastroesophageal reflux disease)     takes Nexium daily  . Anxiety     takes Xanax as needed  . History of shingles     Past Surgical History  Procedure Laterality Date  . Total hip arthroplasty Right 11/01/2008  . Appendectomy  1995  . Tonsillectomy and adenoidectomy    . Joint replacement    . Coronary artery bypass graft  09/21/2009    CABG X2  . Cardiac catheterization  1990's; 06/2009  . Cholecystectomy  2008  . Larynx surgery  ~ 1960    "tumor removed"  . Total abdominal hysterectomy  1986  . Tubal ligation  1973  . Cataract extraction w/ intraocular lens  implant, bilateral Bilateral   . Retinal laser procedure Left 10/19/2013    "had a wrinkle in it"  . Tee without cardioversion N/A 05/10/2014    Procedure: TRANSESOPHAGEAL ECHOCARDIOGRAM (TEE);  Surgeon: Eber Hong  Radford Pax, MD;  Location: [facility] ENDOSCOPY;  Service: Cardiovascular;  Laterality: N/A;  . Atrial fibrillation ablation N/A 05/11/2014    Procedure: ATRIAL FIBRILLATION ABLATION;  Surgeon: Thompson Grayer, MD;  Location: [facility] CATH LAB;  Service: Cardiovascular;  Laterality: N/A;  . Colonosocpy    . Esophagogastroduodenoscopy    . Direct laryngoscopy N/A 06/03/2015    Procedure: Direct laryngoscopy with biopsy left epiglottic lesion;  Surgeon: [name], MD;  Location: [facility];  Service: ENT;  Laterality: N/A;    Current Outpatient Prescriptions  Medication Sig Dispense Refill  . ALPRAZolam (XANAX) 0.25 MG tablet Take 0.25 mg by mouth 2 (two) times daily as needed for anxiety or sleep.    [name] apixaban (ELIQUIS) 5 MG TABS tablet Take 1 tablet (5 mg total) by mouth 2 (two) times daily. 60 tablet 1  . cholecalciferol (VITAMIN D) 1000 UNITS tablet  Take 1,000 Units by mouth daily.    [name] esomeprazole (NEXIUM) 40 MG capsule Take 40 mg by mouth daily before breakfast.      . ipratropium-albuterol (DUONEB) 0.5-2.5 (3) MG/3ML SOLN Take 3 mLs by nebulization 4 (four) times daily. 360 mL 11  . levothyroxine (SYNTHROID, LEVOTHROID) 25 MCG tablet Take 25 mcg by mouth daily before breakfast.     . lidocaine (XYLOCAINE) 2 % solution Mix 1 part 2%viscous lidocaine,1part H2O.Swish and/or swallow 28mL of this mixture,34min before meals and at bedtime, up to QID 100 mL 5  . metoprolol succinate (TOPROL-XL) 25 MG 24 hr tablet Take 1 tablet (25 mg total) by mouth daily.    . nitroGLYCERIN (NITROSTAT) 0.4 MG SL tablet Place 0.4 mg under the tongue every 5 (five) minutes as needed for chest pain.    . OXYGEN Inhale 2 L into the lungs as needed (for shortness of breath).     [name] Glycol-Propyl Glycol (SYSTANE OP) Place 1 drop into both eyes 2 (two) times daily.     . sodium fluoride (FLUORISHIELD) 1.1 % GEL dental gel Instill one drop of gel per tooth space of fluoride tray. Place over teeth for 5 minutes. Remove. Spit out excess. Repeat nightly. 120 mL prn  . sucralfate (CARAFATE) 1 g tablet Dissolve 1 tablet in 60mL H2O and swallow up to QID,PRN sore throat. (Patient not taking: Reported on 07/08/2015) 60 tablet 5  . traMADol (ULTRAM) 50 MG tablet Take 1 tablet (50 mg total) by mouth every 6 (six) hours as needed. Pain 30 tablet 0  . [DISCONTINUED] budesonide-formoterol (SYMBICORT) 160-4.5 MCG/ACT inhaler Inhale 2 puffs into the lungs 2 (two) times daily. 1 Inhaler 12  . [DISCONTINUED] calcium citrate-vitamin D (CITRACAL+D) 315-200 MG-UNIT per tablet Take 1 tablet by mouth 2 (two) times daily.       No current facility-administered medications for this visit.    Allergies:   Levofloxacin; Penicillins; Spiriva; Statins; Budesonide-formoterol fumarate; Morphine; Phenazopyridine hcl; Pneumococcal vaccines; Prednisone; Sulfa antibiotics; and Sulfonamide  derivatives   Social History:  The patient  reports that she has quit smoking. Her smoking use included Cigarettes. She has never used smokeless tobacco. She reports that she does not drink alcohol or use illicit drugs.   Family History:  The patient's family history includes Brain cancer in her sister; Breast cancer in her sister; Colon cancer in her daughter and other; Colon polyps in her brother; Deep vein thrombosis in her mother; Diabetes in her father; Heart attack in her father; Heart disease in her mother; Hyperlipidemia in her father; Hypertension in her brother, father, and mother; Myasthenia  gravis in her brother; Transient ischemic attack in her mother.  ROS:  Please see the history of present illness.  All other systems are reviewed and otherwise negative.   PHYSICAL EXAM: ** VS:  There were no vitals taken for this visit. BMI: There is no weight on file to calculate BMI. Well nourished, well developed, in no acute distress HEENT: normocephalic, atraumatic Neck: no JVD, carotid bruits or masses Cardiac:  normal S1, S2; RRR; no significant murmurs, no rubs, or gallops Lungs:  clear to auscultation bilaterally, no wheezing, rhonchi or rales Abd: soft, nontender MS: no deformity or atrophy Ext: no edema Skin: warm and dry, no rash Neuro:  No gross deficits appreciated Psych: euthymic mood, full affect  ** ICD site is stable, no tethering or discomfort   EKG:  Done 06/01/15 shows SR, RBBB 06/18/15: Echocardiogram Study Conclusions - Left ventricle: LVEF is approximately 50% with akinesis of the  posteior wall. The cavity size was normal. Wall thickness was  normal. Doppler parameters are consistent with abnormal left  ventricular relaxation (grade 1 diastolic dysfunction). - Mitral valve: There was mild regurgitation. Impressions: - Poor acoustic windows limit study.  02/02/14: stress myoview Overall Impression: Normal stress nuclear study. LV Ejection Fraction:  66%. LV Wall Motion: NL LV Function; NL Wall Motion.  Recent Labs: 06/02/2015: BUN <5*; Creatinine, Ser 0.95; Platelets 462* 06/03/2015: Hemoglobin 12.2; Potassium 3.8; Sodium 142  No results found for requested labs within last 365 days.   CrCl cannot be calculated (Unknown ideal weight.).   Wt Readings from Last 3 Encounters:  07/08/15 129 lb 1.6 oz (58.559 kg)  06/28/15 132 lb 8 oz (60.102 kg)  06/22/15 129 lb 1.6 oz (58.559 kg)     Other studies reviewed: Additional studies/records reviewed today include: summarized above   ASSESSMENT AND PLAN:  ** Coumadin or OAC Labs, CHADS   1.  PAFib     CHA2DS2Vasc is at least 4, on Eliquis     PVI ablation April 2016     06/02/15 Creat 0.95, H/H stable  2. HTN     ** controlled  3. COPD  4. New dx of epiglittis/glottis cancer     Newly started on XRT   Disposition: F/u with   Current medicines are reviewed at length with the patient today.  The patient did not have any concerns regarding medicines.**  SignedJennings Books, PA-C 07/24/2015 12:22 PM     [address] [place] [address] [phone] (office)  [phone] (fax)    This encounter was created in error - please disregard.

## 2015-07-25 ENCOUNTER — Ambulatory Visit
Admission: RE | Admit: 2015-07-25 | Discharge: 2015-07-25 | Disposition: A | Discharge status: Home / Self Care | Payer: Medicare Other | Source: Ambulatory Visit | Attending: Radiation Oncology | Admitting: Radiation Oncology

## 2015-07-25 ENCOUNTER — Encounter: Payer: Medicare Other | Admitting: Physician Assistant

## 2015-07-25 ENCOUNTER — Encounter: Payer: Self-pay | Admitting: Radiation Oncology

## 2015-07-25 VITALS — BP 142/97 | HR 76 | Temp 97.6°F | Ht 66.0 in | Wt 128.1 lb

## 2015-07-25 DIAGNOSIS — Z510 Encounter for antineoplastic radiation therapy: Secondary | ICD-10-CM | POA: Diagnosis not present

## 2015-07-25 DIAGNOSIS — Z87891 Personal history of nicotine dependence: Secondary | ICD-10-CM | POA: Diagnosis not present

## 2015-07-25 DIAGNOSIS — C321 Malignant neoplasm of supraglottis: Secondary | ICD-10-CM

## 2015-07-25 DIAGNOSIS — C320 Malignant neoplasm of glottis: Secondary | ICD-10-CM | POA: Diagnosis not present

## 2015-07-25 MED ORDER — HYDROCODONE-ACETAMINOPHEN 7.5-325 MG/15ML PO SOLN: ORAL | Status: DC

## 2015-07-25 NOTE — Progress Notes (Signed)
[name] presents for her 5th fraction of radiation to her Larynx. She reports pain a 5/10 in her constant throat pain which has been there for about year. She is currently taking Tylenol and Tramadol, which she is switching back and forth to try to see which works the best. She is doing her swallowing exercises daily which she reports has caused some of her pain. She reports she is not eating well. She is eating softer foods including baked potatoes, eggs, pudding and other soft items. She does not believe she is eating enough. She does not like the nutritional supplements. She states she felt nauseated over the weekend, and was not able to eat very well. She is having daily bowel movements. She reports bloody sputum, which has been present for a year. She also reports recent migraines since starting radiation.   BP 142/97 mmHg  Pulse 76  Temp(Src) 97.6 F (36.4 C)  Ht 5\' 6"  (1.676 m)  Wt 128 lb 1.6 oz (58.106 kg)  BMI 20.69 kg/m2  SpO2 97%   Wt Readings from Last 3 Encounters:  07/25/15 128 lb 1.6 oz (58.106 kg)  07/08/15 129 lb 1.6 oz (58.559 kg)  06/28/15 132 lb 8 oz (60.102 kg)

## 2015-07-25 NOTE — Telephone Encounter (Signed)
ENT cancer noted

## 2015-07-25 NOTE — Progress Notes (Signed)
Weekly Management Note:  Outpatient    ICD-9-CM ICD-10-CM   1. 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  CBCT/MVCT images/Port film x-rays were reviewed.  The chart was checked. She reports pain a 5/10 in her constant throat pain which has been there for about year. She is currently taking Tylenol and Tramadol, which she is switching back and forth to try to see which works the best. She is doing her swallowing exercises daily which she reports has caused some of her pain. She reports she is not eating well. She is eating softer foods including baked potatoes, eggs, pudding and other soft items. She does not believe she is eating enough. She does not like the nutritional supplements. She states she felt nauseated over the weekend, and was not able to eat very well. She is having daily bowel movements. She reports bloody sputum, which has been present for a year. She also reports recent migraines since starting radiation.   Physical Findings:  Wt Readings from Last 3 Encounters:  07/25/15 128 lb 1.6 oz (58.106 kg)  07/08/15 129 lb 1.6 oz (58.559 kg)  06/28/15 132 lb 8 oz (60.102 kg)    height is 5\' 6"  (1.676 m) and weight is 128 lb 1.6 oz (58.106 kg). Her temperature is 97.6 F (36.4 C). Her blood pressure is 142/97 and her pulse is 76. Her oxygen saturation is 97%.   NAD no obvious neck masses, skin is intact over neck  CBC    Component Value Date/Time   WBC 6.7 06/02/2015 1133   WBC 9.8 03/17/2011 1449   RBC 4.44 06/02/2015 1133   RBC 4.85 03/17/2011 1449   HGB 12.2 06/03/2015 0621   HGB 13.4 03/17/2011 1449   HCT 36.0 06/03/2015 0621   HCT 43.3 03/17/2011 1449   PLT 462* 06/02/2015 1133   MCV 86.3 06/02/2015 1133   MCV 89.3 03/17/2011 1449   MCH 26.6 06/02/2015 1133   MCH 27.6 03/17/2011 1449   MCHC 30.8 06/02/2015 1133   MCHC 30.9* 03/17/2011 1449   RDW 14.6 06/02/2015 1133   LYMPHSABS 3.5 12/22/2014 1151   MONOABS 0.9 12/22/2014 1151   EOSABS 0.3 12/22/2014 1151   BASOSABS 0.1 12/22/2014 1151     CMP     Component Value Date/Time   NA 142 06/03/2015 0621   NA 144 01/02/2014 0000   K 3.8 06/03/2015 0621   CL 99* 06/02/2015 1133   CO2 29 06/02/2015 1133   GLUCOSE 102* 06/03/2015 0621   GLUCOSE 72 01/02/2014 0000   BUN <5* 06/02/2015 1133   BUN 13 01/02/2014 0000   CREATININE 0.95 06/02/2015 1133   CREATININE 0.96* 04/25/2015 1104   CALCIUM 8.5* 06/02/2015 1133   PROT 6.5 09/21/2009 0630   ALBUMIN 3.2* 09/21/2009 0630   AST 23 09/21/2009 0630   ALT 15 09/21/2009 0630   ALKPHOS 127* 09/21/2009 0630   BILITOT 0.8 09/21/2009 0630   GFRNONAA 56* 06/02/2015 1133   GFRAA >60 06/02/2015 1133     Impression:  The patient is tolerating radiotherapy.   Plan:  Continue radiotherapy as planned. Discussed nutritional goals.  Discussed bloody sputum, and instructed her to call Dr [name] as a precaution for a visit if this escalates, or call ED if it becomes severe , ie copious bright red blood.  Hycet Rx  for pain.  -----------------------------------  Eppie Gibson, MD

## 2015-07-26 ENCOUNTER — Ambulatory Visit
Admission: RE | Admit: 2015-07-26 | Discharge: 2015-07-26 | Disposition: A | Discharge status: Home / Self Care | Payer: Medicare Other | Source: Ambulatory Visit | Attending: Radiation Oncology | Admitting: Radiation Oncology

## 2015-07-26 DIAGNOSIS — Z510 Encounter for antineoplastic radiation therapy: Secondary | ICD-10-CM | POA: Diagnosis not present

## 2015-07-26 DIAGNOSIS — Z87891 Personal history of nicotine dependence: Secondary | ICD-10-CM | POA: Diagnosis not present

## 2015-07-26 DIAGNOSIS — C321 Malignant neoplasm of supraglottis: Secondary | ICD-10-CM | POA: Diagnosis not present

## 2015-07-26 DIAGNOSIS — C320 Malignant neoplasm of glottis: Secondary | ICD-10-CM | POA: Diagnosis not present

## 2015-07-27 ENCOUNTER — Ambulatory Visit
Admission: RE | Admit: 2015-07-27 | Discharge: 2015-07-27 | Disposition: A | Discharge status: Home / Self Care | Payer: Medicare Other | Source: Ambulatory Visit | Attending: Radiation Oncology | Admitting: Radiation Oncology

## 2015-07-27 DIAGNOSIS — C321 Malignant neoplasm of supraglottis: Secondary | ICD-10-CM | POA: Diagnosis not present

## 2015-07-27 DIAGNOSIS — Z510 Encounter for antineoplastic radiation therapy: Secondary | ICD-10-CM | POA: Diagnosis not present

## 2015-07-27 DIAGNOSIS — Z87891 Personal history of nicotine dependence: Secondary | ICD-10-CM | POA: Diagnosis not present

## 2015-07-27 DIAGNOSIS — C320 Malignant neoplasm of glottis: Secondary | ICD-10-CM | POA: Diagnosis not present

## 2015-07-28 ENCOUNTER — Ambulatory Visit: Payer: Medicare Other | Admitting: Nutrition

## 2015-07-28 ENCOUNTER — Ambulatory Visit
Admission: RE | Admit: 2015-07-28 | Discharge: 2015-07-28 | Disposition: A | Discharge status: Home / Self Care | Payer: Medicare Other | Source: Ambulatory Visit | Attending: Radiation Oncology | Admitting: Radiation Oncology

## 2015-07-28 DIAGNOSIS — C321 Malignant neoplasm of supraglottis: Secondary | ICD-10-CM | POA: Diagnosis not present

## 2015-07-28 DIAGNOSIS — C320 Malignant neoplasm of glottis: Secondary | ICD-10-CM | POA: Diagnosis not present

## 2015-07-28 DIAGNOSIS — Z510 Encounter for antineoplastic radiation therapy: Secondary | ICD-10-CM | POA: Diagnosis not present

## 2015-07-28 DIAGNOSIS — Z87891 Personal history of nicotine dependence: Secondary | ICD-10-CM | POA: Diagnosis not present

## 2015-07-28 NOTE — Progress Notes (Signed)
Nutrition follow-up completed with patient diagnosed with cancer of the epiglottis. Weight was documented 128.1 pounds, down from 132.5 pounds May 23. Oral intake has decreased.  However, patient is trying a variety of different foods. She continues to have painful and difficulty swallowing but now has a new medication to help her  Nutrition diagnosis: Inadequate oral intake continues.  Intervention:  Patient was educated on strategies for eating small frequent meals and snacks all throughout the day.  Hopefully every 2 hours. Reviewed strategies for increasing foods patient can tolerate. Provided additional fact sheet on soft moist, high-protein foods. Provided samples of boost breeze Questions were answered and teach back method was used.  Monitoring, evaluation, goals: Patient will tolerate adequate calories and protein to minimize further weight loss.  Next visit: Thursday, June 29 after radiation therapy.  **Disclaimer: This note was dictated with voice recognition software. Similar sounding words can inadvertently be transcribed and this note may contain transcription errors which may not have been corrected upon publication of note.**

## 2015-07-29 ENCOUNTER — Ambulatory Visit
Admission: RE | Admit: 2015-07-29 | Discharge: 2015-07-29 | Disposition: A | Discharge status: Home / Self Care | Payer: Medicare Other | Source: Ambulatory Visit | Attending: Radiation Oncology | Admitting: Radiation Oncology

## 2015-07-29 DIAGNOSIS — Z87891 Personal history of nicotine dependence: Secondary | ICD-10-CM | POA: Diagnosis not present

## 2015-07-29 DIAGNOSIS — C320 Malignant neoplasm of glottis: Secondary | ICD-10-CM | POA: Diagnosis not present

## 2015-07-29 DIAGNOSIS — Z510 Encounter for antineoplastic radiation therapy: Secondary | ICD-10-CM | POA: Diagnosis not present

## 2015-07-29 DIAGNOSIS — C321 Malignant neoplasm of supraglottis: Secondary | ICD-10-CM | POA: Diagnosis not present

## 2015-07-29 NOTE — Progress Notes (Signed)
   Weekly Management Note:  Outpatient    ICD-9-CM ICD-10-CM   1. 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  CBCT/MVCT images/Port film x-rays were reviewed.  The chart was checked. No pain. Gaining weight.    Physical Findings:  Wt Readings from Last 3 Encounters:  08/01/15 132 lb 1.6 oz (59.92 kg)  07/25/15 128 lb 1.6 oz (58.106 kg)  07/08/15 129 lb 1.6 oz (58.559 kg)    height is 5\' 6"  (1.676 m) and weight is 132 lb 1.6 oz (59.92 kg). Her temperature is 97.7 F (36.5 C). Her blood pressure is 154/84 and her pulse is 82. Her oxygen saturation is 96%.   NAD , oral cavity clear, skin is intact over neck.   CBC    Component Value Date/Time   WBC 6.7 06/02/2015 1133   WBC 9.8 03/17/2011 1449   RBC 4.44 06/02/2015 1133   RBC 4.85 03/17/2011 1449   HGB 12.2 06/03/2015 0621   HGB 13.4 03/17/2011 1449   HCT 36.0 06/03/2015 0621   HCT 43.3 03/17/2011 1449   PLT 462* 06/02/2015 1133   MCV 86.3 06/02/2015 1133   MCV 89.3 03/17/2011 1449   MCH 26.6 06/02/2015 1133   MCH 27.6 03/17/2011 1449   MCHC 30.8 06/02/2015 1133   MCHC 30.9* 03/17/2011 1449   RDW 14.6 06/02/2015 1133   LYMPHSABS 3.5 12/22/2014 1151   MONOABS 0.9 12/22/2014 1151   EOSABS 0.3 12/22/2014 1151   BASOSABS 0.1 12/22/2014 1151     CMP     Component Value Date/Time   NA 142 06/03/2015 0621   NA 144 01/02/2014 0000   K 3.8 06/03/2015 0621   CL 99* 06/02/2015 1133   CO2 29 06/02/2015 1133   GLUCOSE 102* 06/03/2015 0621   GLUCOSE 72 01/02/2014 0000   BUN <5* 06/02/2015 1133   BUN 13 01/02/2014 0000   CREATININE 0.95 06/02/2015 1133   CREATININE 0.96* 04/25/2015 1104   CALCIUM 8.5* 06/02/2015 1133   PROT 6.5 09/21/2009 0630   ALBUMIN 3.2* 09/21/2009 0630   AST 23 09/21/2009 0630   ALT 15 09/21/2009 0630   ALKPHOS 127* 09/21/2009 0630   BILITOT 0.8 09/21/2009 0630   GFRNONAA 56*  06/02/2015 1133   GFRAA >60 06/02/2015 1133     Impression:  The patient is tolerating radiotherapy.   Plan:  Continue radiotherapy as planned.    Addendum- Called pt after visit to recommend Senokot prn constipation. She reported a new headache, strong, but has had similar HAs before.  Not the worst of her life.  Advised to go to ED if it becomes severe.  -----------------------------------  Eppie Gibson, MD

## 2015-08-01 ENCOUNTER — Ambulatory Visit
Admission: RE | Admit: 2015-08-01 | Discharge: 2015-08-01 | Disposition: A | Discharge status: Home / Self Care | Payer: Medicare Other | Source: Ambulatory Visit | Attending: Radiation Oncology | Admitting: Radiation Oncology

## 2015-08-01 VITALS — BP 154/84 | HR 82 | Temp 97.7°F | Ht 66.0 in | Wt 132.1 lb

## 2015-08-01 DIAGNOSIS — C321 Malignant neoplasm of supraglottis: Secondary | ICD-10-CM

## 2015-08-01 DIAGNOSIS — Z510 Encounter for antineoplastic radiation therapy: Secondary | ICD-10-CM | POA: Diagnosis not present

## 2015-08-01 DIAGNOSIS — Z87891 Personal history of nicotine dependence: Secondary | ICD-10-CM | POA: Diagnosis not present

## 2015-08-01 DIAGNOSIS — C320 Malignant neoplasm of glottis: Secondary | ICD-10-CM | POA: Diagnosis not present

## 2015-08-01 NOTE — Progress Notes (Signed)
[name] is here for her 10th fraction of radiation to her Larynx. She denies pain at this time. She reports she is eating well. She is drinking three 8 ounce bottles of water daily and other types of drinks. She is using the Lidocaine rinses in the morning and at night. She does have throat pain when swallowing. She reports some mild constipation, but has not started taking anything at this time. She reports itching over her Radiation site. The skin to this area is slightly red. She is using the sonafine daily.   BP 154/84 mmHg  Pulse 82  Temp(Src) 97.7 F (36.5 C)  Ht 5\' 6"  (1.676 m)  Wt 132 lb 1.6 oz (59.92 kg)  BMI 21.33 kg/m2  SpO2 96%   Wt Readings from Last 3 Encounters:  08/01/15 132 lb 1.6 oz (59.92 kg)  07/25/15 128 lb 1.6 oz (58.106 kg)  07/08/15 129 lb 1.6 oz (58.559 kg)

## 2015-08-02 ENCOUNTER — Ambulatory Visit
Admission: RE | Admit: 2015-08-02 | Discharge: 2015-08-02 | Disposition: A | Discharge status: Home / Self Care | Payer: Medicare Other | Source: Ambulatory Visit | Attending: Radiation Oncology | Admitting: Radiation Oncology

## 2015-08-02 DIAGNOSIS — Z510 Encounter for antineoplastic radiation therapy: Secondary | ICD-10-CM | POA: Diagnosis not present

## 2015-08-02 DIAGNOSIS — C320 Malignant neoplasm of glottis: Secondary | ICD-10-CM | POA: Diagnosis not present

## 2015-08-02 DIAGNOSIS — C321 Malignant neoplasm of supraglottis: Secondary | ICD-10-CM | POA: Diagnosis not present

## 2015-08-02 DIAGNOSIS — Z87891 Personal history of nicotine dependence: Secondary | ICD-10-CM | POA: Diagnosis not present

## 2015-08-03 ENCOUNTER — Ambulatory Visit
Admission: RE | Admit: 2015-08-03 | Discharge: 2015-08-03 | Disposition: A | Discharge status: Home / Self Care | Payer: Medicare Other | Source: Ambulatory Visit | Attending: Radiation Oncology | Admitting: Radiation Oncology

## 2015-08-03 DIAGNOSIS — Z510 Encounter for antineoplastic radiation therapy: Secondary | ICD-10-CM | POA: Diagnosis not present

## 2015-08-03 DIAGNOSIS — C321 Malignant neoplasm of supraglottis: Secondary | ICD-10-CM | POA: Diagnosis not present

## 2015-08-03 DIAGNOSIS — C320 Malignant neoplasm of glottis: Secondary | ICD-10-CM | POA: Diagnosis not present

## 2015-08-03 DIAGNOSIS — Z87891 Personal history of nicotine dependence: Secondary | ICD-10-CM | POA: Diagnosis not present

## 2015-08-03 NOTE — Progress Notes (Signed)
Weekly Management Note:  Outpatient    ICD-9-CM ICD-10-CM   1. 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  CBCT/MVCT images/Port film x-rays were reviewed.  The chart was checked. [name] presents for her 14th fraction of radiation to her Larynx. She reports pain a 4/10 in her throat, but it does increase when she is swallowing. She is taking Hycet 2-3 times a day for this pain. She reports she is coughing up a scant amount of blood which she says has been chronic for the past year. She is not eating much at all, she says she cannot swallow her food because she feels like her throat is closing up on her. She also reports thick saliva. She has decided she would like a feeding tube . She has been using the Sonafine and hydrocortisone cream to her neck.   Physical Findings:  Wt Readings from Last 3 Encounters:  08/05/15 129 lb 4.8 oz (58.65 kg)  08/01/15 132 lb 1.6 oz (59.92 kg)  07/25/15 128 lb 1.6 oz (58.106 kg)    height is 5\' 6"  (1.676 m) and weight is 129 lb 4.8 oz (58.65 kg). Her temperature is 97.7 F (36.5 C). Her blood pressure is 143/66 and her pulse is 87. Her oxygen saturation is 95%.     Neck is slightly erythematous, no obvious masses in the neck. Oral cavity is clear.   CBC    Component Value Date/Time   WBC 6.7 06/02/2015 1133   WBC 9.8 03/17/2011 1449   RBC 4.44 06/02/2015 1133   RBC 4.85 03/17/2011 1449   HGB 12.2 06/03/2015 0621   HGB 13.4 03/17/2011 1449   HCT 36.0 06/03/2015 0621   HCT 43.3 03/17/2011 1449   PLT 462* 06/02/2015 1133   MCV 86.3 06/02/2015 1133   MCV 89.3 03/17/2011 1449   MCH 26.6 06/02/2015 1133   MCH 27.6 03/17/2011 1449   MCHC 30.8 06/02/2015 1133   MCHC 30.9* 03/17/2011 1449   RDW 14.6 06/02/2015 1133   LYMPHSABS 3.5 12/22/2014 1151   MONOABS 0.9 12/22/2014 1151   EOSABS 0.3  12/22/2014 1151   BASOSABS 0.1 12/22/2014 1151    CMP     Component Value Date/Time   NA 142 06/03/2015 0621   NA 144 01/02/2014 0000   K 3.8 06/03/2015 0621   CL 99* 06/02/2015 1133   CO2 29 06/02/2015 1133   GLUCOSE 102* 06/03/2015 0621   GLUCOSE 72 01/02/2014 0000   BUN <5* 06/02/2015 1133   BUN 13 01/02/2014 0000   CREATININE 0.95 06/02/2015 1133   CREATININE 0.96* 04/25/2015 1104   CALCIUM 8.5* 06/02/2015 1133   PROT 6.5 09/21/2009 0630   ALBUMIN 3.2* 09/21/2009 0630   AST 23 09/21/2009 0630   ALT 15 09/21/2009 0630   ALKPHOS 127* 09/21/2009 0630   BILITOT 0.8 09/21/2009 0630   GFRNONAA 56* 06/02/2015 1133   GFRAA >60 06/02/2015 1133    Impression:  The patient is tolerating radiotherapy.   Plan:  Continue radiotherapy as planned. I will refer her back to speech language pathology. I will put in an order for a feeding tube and have it scheduled ASAP. I advised her to go to the ER if she is unable to swallow liquids and is at risk for dehydration this weekend. She thinks she will be okay. I will  have her see Mike Craze next week to bridge the gap between my next visit with her on July 11th.   -----------------------------------  Eppie Gibson, MD  This document serves as a record of services personally performed by Eppie Gibson, MD. It was created on her behalf by Derek Mound, a trained medical scribe. The creation of this record is based on the scribe's personal observations and the provider's statements to them. This document has been checked and approved by the attending provider.

## 2015-08-04 ENCOUNTER — Ambulatory Visit
Admission: RE | Admit: 2015-08-04 | Discharge: 2015-08-04 | Disposition: A | Discharge status: Home / Self Care | Payer: Medicare Other | Source: Ambulatory Visit | Attending: Radiation Oncology | Admitting: Radiation Oncology

## 2015-08-04 ENCOUNTER — Ambulatory Visit: Payer: Medicare Other | Admitting: Nutrition

## 2015-08-04 DIAGNOSIS — Z87891 Personal history of nicotine dependence: Secondary | ICD-10-CM | POA: Diagnosis not present

## 2015-08-04 DIAGNOSIS — C320 Malignant neoplasm of glottis: Secondary | ICD-10-CM | POA: Diagnosis not present

## 2015-08-04 DIAGNOSIS — Z510 Encounter for antineoplastic radiation therapy: Secondary | ICD-10-CM | POA: Diagnosis not present

## 2015-08-04 DIAGNOSIS — C321 Malignant neoplasm of supraglottis: Secondary | ICD-10-CM | POA: Diagnosis not present

## 2015-08-04 NOTE — Progress Notes (Signed)
Nutrition follow-up completed with patient and husband.  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 Weight has improved and was documented as 132.1 pounds June 26 increased from 128.1 pounds June 19 Patient reports she has been unable to eat anything besides powdered donuts both yesterday and today. She reports taste alterations and painful swallowing. She tried soft crab cakes and a [employer] Frosty and was unable to swallow. Patient reports she is scared to eat. Patient states "I have decided to get the feeding tube." Patient reports she has Medicare.  Estimated nutrition needs: 1700-2000 calories, 70-85 grams protein, 2 L fluid.  Nutrition diagnosis: Inadequate oral intake continues.  Intervention: Obtained dietary recall from patient who does appear to only be eating 5 or 6 right powdered donuts both yesterday and today. Educated patient about feeding tube placement and the process for ordering tube feeding. Provided support and encouragement for patient who is discouraged that she cannot eat. Patient meets with her physician tomorrow and states she will discuss further with her.  It tube feeding, required, recommend Jevity 1.2, titrate to goal of 6 cans daily (1-1/2 cans 4 times a day) providing 1710 cal, 79.2 g protein, and 1146 mL free water.  Recommended 30 cc free water before and after bolus feeding and 240 cc free water 3 times a day.  Monitoring, evaluation, goals: Patient will tolerate calories and protein by mouth or feeding tube to minimize weight loss and promote healing after treatment.  Next visit: Thursday, July 6.  **Disclaimer: This note was dictated with voice recognition software. Similar sounding words can inadvertently be transcribed and this note may contain transcription errors which may not have been corrected upon publication of note.**

## 2015-08-05 ENCOUNTER — Ambulatory Visit
Admission: RE | Admit: 2015-08-05 | Discharge: 2015-08-05 | Disposition: A | Discharge status: Home / Self Care | Payer: Medicare Other | Source: Ambulatory Visit | Attending: Radiation Oncology | Admitting: Radiation Oncology

## 2015-08-05 ENCOUNTER — Encounter: Payer: Self-pay | Admitting: Radiation Oncology

## 2015-08-05 VITALS — BP 143/66 | HR 87 | Temp 97.7°F | Ht 66.0 in | Wt 129.3 lb

## 2015-08-05 DIAGNOSIS — C321 Malignant neoplasm of supraglottis: Secondary | ICD-10-CM | POA: Diagnosis not present

## 2015-08-05 DIAGNOSIS — C320 Malignant neoplasm of glottis: Secondary | ICD-10-CM | POA: Diagnosis not present

## 2015-08-05 DIAGNOSIS — Z87891 Personal history of nicotine dependence: Secondary | ICD-10-CM | POA: Diagnosis not present

## 2015-08-05 DIAGNOSIS — Z510 Encounter for antineoplastic radiation therapy: Secondary | ICD-10-CM | POA: Diagnosis not present

## 2015-08-05 NOTE — Progress Notes (Signed)
Renee [name] presents for her 14th fraction of radiation to her Larynx. She reports pain a 4/10 in her throat, but it does increase when she is swallowing. She is taking Hycet 2-3 times a day for this pain. She reports she is coughing up a scant amount of blood which she says has been chronic for the past year. She is not eating much at all, she says she cannot swallow her food because she feels like her throat is closing up on her.  She also reports thick salivaShe has decided she would like a feeding tube, and would like to talk to Dr. [name] about that today. Her neck is red, and itching. She has been using the sonafine and hydrocortisone cream to her neck. She would also like a referral to help with swallowing, she is doing her exercises, but is having trouble with two of them, and would like some advice on something else she could do.   BP 143/66 mmHg  Pulse 87  Temp(Src) 97.7 F (36.5 C)  Ht 5\' 6"  (1.676 m)  Wt 129 lb 4.8 oz (58.65 kg)  BMI 20.88 kg/m2  SpO2 95%   Wt Readings from Last 3 Encounters:  08/05/15 129 lb 4.8 oz (58.65 kg)  08/01/15 132 lb 1.6 oz (59.92 kg)  07/25/15 128 lb 1.6 oz (58.106 kg)

## 2015-08-08 ENCOUNTER — Encounter (HOSPITAL_COMMUNITY): Payer: Self-pay

## 2015-08-08 ENCOUNTER — Ambulatory Visit
Admission: RE | Admit: 2015-08-08 | Discharge: 2015-08-08 | Disposition: A | Discharge status: Home / Self Care | Payer: Medicare Other | Source: Ambulatory Visit | Attending: Radiation Oncology | Admitting: Radiation Oncology

## 2015-08-08 ENCOUNTER — Encounter: Payer: Self-pay | Admitting: *Deleted

## 2015-08-08 ENCOUNTER — Other Ambulatory Visit: Payer: Self-pay | Admitting: Adult Health

## 2015-08-08 ENCOUNTER — Other Ambulatory Visit: Payer: Self-pay | Admitting: Radiology

## 2015-08-08 ENCOUNTER — Ambulatory Visit: Payer: Medicare Other

## 2015-08-08 ENCOUNTER — Telehealth: Payer: Self-pay

## 2015-08-08 ENCOUNTER — Ambulatory Visit (HOSPITAL_COMMUNITY)
Admission: RE | Admit: 2015-08-08 | Discharge: 2015-08-08 | Disposition: A | Discharge status: Home / Self Care | Payer: Medicare Other | Source: Ambulatory Visit | Attending: Radiation Oncology | Admitting: Radiation Oncology

## 2015-08-08 ENCOUNTER — Ambulatory Visit: Payer: Medicare Other | Admitting: Nutrition

## 2015-08-08 DIAGNOSIS — I5032 Chronic diastolic (congestive) heart failure: Secondary | ICD-10-CM | POA: Diagnosis not present

## 2015-08-08 DIAGNOSIS — I951 Orthostatic hypotension: Secondary | ICD-10-CM | POA: Diagnosis not present

## 2015-08-08 DIAGNOSIS — Z87891 Personal history of nicotine dependence: Secondary | ICD-10-CM | POA: Insufficient documentation

## 2015-08-08 DIAGNOSIS — Z86010 Personal history of colonic polyps: Secondary | ICD-10-CM | POA: Diagnosis not present

## 2015-08-08 DIAGNOSIS — I480 Paroxysmal atrial fibrillation: Secondary | ICD-10-CM | POA: Diagnosis not present

## 2015-08-08 DIAGNOSIS — K648 Other hemorrhoids: Secondary | ICD-10-CM | POA: Insufficient documentation

## 2015-08-08 DIAGNOSIS — K219 Gastro-esophageal reflux disease without esophagitis: Secondary | ICD-10-CM | POA: Diagnosis not present

## 2015-08-08 DIAGNOSIS — C321 Malignant neoplasm of supraglottis: Secondary | ICD-10-CM

## 2015-08-08 DIAGNOSIS — J449 Chronic obstructive pulmonary disease, unspecified: Secondary | ICD-10-CM | POA: Insufficient documentation

## 2015-08-08 DIAGNOSIS — C329 Malignant neoplasm of larynx, unspecified: Secondary | ICD-10-CM | POA: Diagnosis not present

## 2015-08-08 DIAGNOSIS — K589 Irritable bowel syndrome without diarrhea: Secondary | ICD-10-CM | POA: Insufficient documentation

## 2015-08-08 DIAGNOSIS — M549 Dorsalgia, unspecified: Secondary | ICD-10-CM | POA: Insufficient documentation

## 2015-08-08 DIAGNOSIS — H3530 Unspecified macular degeneration: Secondary | ICD-10-CM | POA: Insufficient documentation

## 2015-08-08 DIAGNOSIS — Z833 Family history of diabetes mellitus: Secondary | ICD-10-CM | POA: Insufficient documentation

## 2015-08-08 DIAGNOSIS — M1990 Unspecified osteoarthritis, unspecified site: Secondary | ICD-10-CM | POA: Insufficient documentation

## 2015-08-08 DIAGNOSIS — Z8249 Family history of ischemic heart disease and other diseases of the circulatory system: Secondary | ICD-10-CM | POA: Insufficient documentation

## 2015-08-08 DIAGNOSIS — M810 Age-related osteoporosis without current pathological fracture: Secondary | ICD-10-CM | POA: Diagnosis not present

## 2015-08-08 DIAGNOSIS — I252 Old myocardial infarction: Secondary | ICD-10-CM | POA: Insufficient documentation

## 2015-08-08 DIAGNOSIS — Z803 Family history of malignant neoplasm of breast: Secondary | ICD-10-CM | POA: Insufficient documentation

## 2015-08-08 DIAGNOSIS — J9610 Chronic respiratory failure, unspecified whether with hypoxia or hypercapnia: Secondary | ICD-10-CM | POA: Insufficient documentation

## 2015-08-08 DIAGNOSIS — Z9981 Dependence on supplemental oxygen: Secondary | ICD-10-CM | POA: Diagnosis not present

## 2015-08-08 DIAGNOSIS — I2510 Atherosclerotic heart disease of native coronary artery without angina pectoris: Secondary | ICD-10-CM | POA: Insufficient documentation

## 2015-08-08 DIAGNOSIS — E46 Unspecified protein-calorie malnutrition: Secondary | ICD-10-CM

## 2015-08-08 DIAGNOSIS — F419 Anxiety disorder, unspecified: Secondary | ICD-10-CM | POA: Diagnosis not present

## 2015-08-08 DIAGNOSIS — I4510 Unspecified right bundle-branch block: Secondary | ICD-10-CM | POA: Diagnosis not present

## 2015-08-08 DIAGNOSIS — Z880 Allergy status to penicillin: Secondary | ICD-10-CM | POA: Insufficient documentation

## 2015-08-08 DIAGNOSIS — G8929 Other chronic pain: Secondary | ICD-10-CM | POA: Diagnosis not present

## 2015-08-08 DIAGNOSIS — E039 Hypothyroidism, unspecified: Secondary | ICD-10-CM | POA: Insufficient documentation

## 2015-08-08 DIAGNOSIS — Z951 Presence of aortocoronary bypass graft: Secondary | ICD-10-CM | POA: Insufficient documentation

## 2015-08-08 DIAGNOSIS — Z96641 Presence of right artificial hip joint: Secondary | ICD-10-CM | POA: Insufficient documentation

## 2015-08-08 DIAGNOSIS — Z800 Family history of malignant neoplasm of digestive organs: Secondary | ICD-10-CM | POA: Insufficient documentation

## 2015-08-08 DIAGNOSIS — R1310 Dysphagia, unspecified: Secondary | ICD-10-CM | POA: Diagnosis not present

## 2015-08-08 DIAGNOSIS — Z8619 Personal history of other infectious and parasitic diseases: Secondary | ICD-10-CM | POA: Insufficient documentation

## 2015-08-08 DIAGNOSIS — Z882 Allergy status to sulfonamides status: Secondary | ICD-10-CM | POA: Insufficient documentation

## 2015-08-08 DIAGNOSIS — I110 Hypertensive heart disease with heart failure: Secondary | ICD-10-CM | POA: Diagnosis not present

## 2015-08-08 DIAGNOSIS — Z7901 Long term (current) use of anticoagulants: Secondary | ICD-10-CM | POA: Insufficient documentation

## 2015-08-08 DIAGNOSIS — Z4682 Encounter for fitting and adjustment of non-vascular catheter: Secondary | ICD-10-CM | POA: Diagnosis not present

## 2015-08-08 DIAGNOSIS — E785 Hyperlipidemia, unspecified: Secondary | ICD-10-CM | POA: Insufficient documentation

## 2015-08-08 DIAGNOSIS — M5430 Sciatica, unspecified side: Secondary | ICD-10-CM | POA: Insufficient documentation

## 2015-08-08 LAB — CBC
HCT: 40.6 % (ref 36.0–46.0)
HEMOGLOBIN: 12.1 g/dL (ref 12.0–15.0)
MCH: 26.9 pg (ref 26.0–34.0)
MCHC: 29.8 g/dL — ABNORMAL LOW (ref 30.0–36.0)
MCV: 90.2 fL (ref 78.0–100.0)
Platelets: 229 10*3/uL (ref 150–400)
RBC: 4.5 MIL/uL (ref 3.87–5.11)
RDW: 17 % — ABNORMAL HIGH (ref 11.5–15.5)
WBC: 4.1 10*3/uL (ref 4.0–10.5)

## 2015-08-08 LAB — APTT: APTT: 33 s (ref 24–37)

## 2015-08-08 LAB — PROTIME-INR
INR: 1.11 (ref 0.00–1.49)
PROTHROMBIN TIME: 14.5 s (ref 11.6–15.2)

## 2015-08-08 LAB — BASIC METABOLIC PANEL
ANION GAP: 9 (ref 5–15)
BUN: <5 mg/dL — ABNORMAL LOW (ref 6–20)
CALCIUM: 8.7 mg/dL — AB (ref 8.9–10.3)
CO2: 30 mmol/L (ref 22–32)
Chloride: 100 mmol/L — ABNORMAL LOW (ref 101–111)
Creatinine, Ser: 0.88 mg/dL (ref 0.44–1.00)
Glucose, Bld: 98 mg/dL (ref 65–99)
Potassium: 2.5 mmol/L — CL (ref 3.5–5.1)
Sodium: 139 mmol/L (ref 135–145)

## 2015-08-08 MED ORDER — ACETAMINOPHEN 325 MG PO TABS
650.0000 mg | ORAL_TABLET | Freq: Four times a day (QID) | ORAL | Status: DC | PRN
  Administered 2015-08-08: 650 mg via ORAL
  Filled 2015-08-08: qty 2

## 2015-08-08 MED ORDER — MIDAZOLAM HCL 2 MG/2ML IJ SOLN
INTRAMUSCULAR | Status: AC | PRN
  Administered 2015-08-08 (×4): 1 mg via INTRAVENOUS

## 2015-08-08 MED ORDER — FENTANYL CITRATE (PF) 100 MCG/2ML IJ SOLN
INTRAMUSCULAR | Status: AC | PRN
  Administered 2015-08-08 (×2): 50 ug via INTRAVENOUS

## 2015-08-08 MED ORDER — FENTANYL CITRATE (PF) 100 MCG/2ML IJ SOLN
INTRAMUSCULAR | Status: AC
  Filled 2015-08-08: qty 2

## 2015-08-08 MED ORDER — VANCOMYCIN HCL IN DEXTROSE 1-5 GM/200ML-% IV SOLN
INTRAVENOUS | Status: AC
  Filled 2015-08-08: qty 200

## 2015-08-08 MED ORDER — OSMOLITE 1.5 CAL PO LIQD: ORAL | Status: AC

## 2015-08-08 MED ORDER — LIDOCAINE HCL 1 % IJ SOLN
INTRAMUSCULAR | Status: AC
  Filled 2015-08-08: qty 20

## 2015-08-08 MED ORDER — VANCOMYCIN HCL IN DEXTROSE 1-5 GM/200ML-% IV SOLN: 1000.0000 mg | INTRAVENOUS | Status: DC

## 2015-08-08 MED ORDER — MIDAZOLAM HCL 2 MG/2ML IJ SOLN
INTRAMUSCULAR | Status: AC
  Filled 2015-08-08: qty 2

## 2015-08-08 MED ORDER — SODIUM CHLORIDE 0.9 % IV SOLN: Freq: Once | INTRAVENOUS | Status: DC

## 2015-08-08 MED ORDER — IOPAMIDOL (ISOVUE-300) INJECTION 61%
INTRAVENOUS | Status: AC
  Administered 2015-08-08: 10 mL
  Filled 2015-08-08: qty 50

## 2015-08-08 MED ORDER — POTASSIUM CHLORIDE 10 MEQ/100ML IV SOLN
10.0000 meq | Freq: Once | INTRAVENOUS | Status: AC
Start: 2015-08-08 — End: 2015-08-08
  Administered 2015-08-08: 10 meq via INTRAVENOUS
  Filled 2015-08-08: qty 100

## 2015-08-08 MED ORDER — GLUCAGON HCL RDNA (DIAGNOSTIC) 1 MG IJ SOLR
INTRAMUSCULAR | Status: AC
  Filled 2015-08-08: qty 1

## 2015-08-08 MED ORDER — GLUCAGON HCL (RDNA) 1 MG IJ SOLR
INTRAMUSCULAR | Status: AC | PRN
  Administered 2015-08-08: 1 mg via INTRAVENOUS

## 2015-08-08 MED ORDER — FENTANYL CITRATE (PF) 100 MCG/2ML IJ SOLN
25.0000 ug | INTRAMUSCULAR | Status: AC | PRN
  Administered 2015-08-08 (×4): 25 ug via INTRAVENOUS

## 2015-08-08 MED ORDER — POTASSIUM CHLORIDE 10 MEQ/100ML IV SOLN
10.0000 meq | INTRAVENOUS | Status: AC
  Administered 2015-08-08 (×2): 10 meq via INTRAVENOUS
  Filled 2015-08-08 (×2): qty 100

## 2015-08-08 MED ORDER — ACETAMINOPHEN 325 MG PO TABS
ORAL_TABLET | ORAL | Status: DC
Start: 2015-08-08 — End: 2015-08-09
  Filled 2015-08-08: qty 2

## 2015-08-08 MED ORDER — POTASSIUM CHLORIDE 10 MEQ/100ML IV SOLN
10.0000 meq | INTRAVENOUS | Status: AC
  Filled 2015-08-08: qty 100

## 2015-08-08 NOTE — Telephone Encounter (Deleted)
I called and spoke with Interventional Radiology at [facility] to confirm that [name] received Potassium while getting her PEG tube today, because her Potassium level was low. They confirmed that she received 2 runs of Potassium before her PEG, and is to receive another run after the PEG is placed.

## 2015-08-08 NOTE — Procedures (Signed)
20 Fr gastrostomy No comp/EBL

## 2015-08-08 NOTE — Sedation Documentation (Signed)
Patient denies pain and is resting comfortably.  

## 2015-08-08 NOTE — Telephone Encounter (Signed)
error 

## 2015-08-08 NOTE — Progress Notes (Signed)
Patient ID: Renee [name], female   DOB: 1934/06/23, 81 y.o.   MRN: [id number]   Rx for: Percocet 5/325 mg #20 1-2 po every 4-6 hrs as needed

## 2015-08-08 NOTE — Progress Notes (Signed)
  Oncology Nurse Navigator Documentation  Navigator Location: [facility] (08/08/15 0370) Navigator Encounter Type: Other (08/08/15 9643)           Patient Visit Type: Other (08/08/15 0855) Treatment Phase: Treatment (08/08/15 0855) Barriers/Navigation Needs: Education (08/08/15 0855)   Interventions: Education Method (08/08/15 0855)     Education Method: Written;Verbal;Teach-back;Demonstration (08/08/15 8381)    Specialty Items/DME: PEG care supplies (08/08/15 0855) Acuity: Level 2 (08/08/15 0855)       Met Renee [name] at [place] where she was having PEG placed.  He husband and daughter were at the bedside.  Using  PEG teaching device   and Teach Back, provided education for PEG use and care, including: hand hygiene; gravity bolus administration of daily water flushes, nutritional supplement, fluids and medications; care of tube insertion site including daily dressing change and cleaning; S&S of infection.  Renee [name] correctly verbalized dressing change and cleaning procedures, provided correct return demonstration of dressing change and gravity administration of water as did her husband and daughter.  She understands I will be available for ongoing PEG educational support.  Provided patient the following PEG supplies:  2x2 gauze  Split gauze  Cotton-tip applicators  840 mL bottle of saline   Paper tape  Mesh brief  60 mL syringe  Provided RN Jonida (?) patient education handout (Elsevier) 'PEG Tube Home Guide, Easy-to-Read' to give to Renee [name] following procedure.  [name], RN, BSN, [place] [facility] [place] [phone]     Time Spent with Patient: 90 (08/08/15 0855)

## 2015-08-08 NOTE — Progress Notes (Signed)
Patient is status post feeding tube placement via interventional radiology today. I expect feeding tube will be ready to use tomorrow. Patient weight documented as 127 pounds which is decreased from 132 pounds June 26. Patient requires education on feeding tube administration. Patient's daughter to contact me today after feeding tube is placed selectively review tube feeding advancement  Estimated nutrition needs: 1700-2000 calories, 70-85 grams protein, 2 L fluid.  Nutrition diagnosis: Inadequate oral intake continues.  Intervention: Patient received one case of Osmolite 1.5 from [facility] today.  Patient will advance Osmolite 1.5 to goal rate of 5 cans daily with 60 cc free water before and after each feeding +240 cc water twice a day to provide 1775 cal, 74.5 g protein, 1769 cc free water. Orders were written and given to [facility]. Will follow-up with patient on Thursday, July 6, for tube feeding tolerance.  Monitoring, evaluation, goals: Patient will tolerate tube feeding advancement to meet greater than 90% of estimated nutrition needs.  Next visit: Thursday, July 6.  **Disclaimer: This note was dictated with voice recognition software. Similar sounding words can inadvertently be transcribed and this note may contain transcription errors which may not have been corrected upon publication of note.**

## 2015-08-08 NOTE — Progress Notes (Addendum)
Lab called with K+2.5, Renee Franklin PA was called to update her, will wait for further advise. No further orders followed.

## 2015-08-08 NOTE — H&P (Signed)
Chief Complaint: Patient was seen in consultation today for percutaneous gastric tube placement at the request of [place]  Referring Physician(s): Eppie Gibson  Supervising Physician: Marybelle Killings  Patient Status: Outpatient  History of Present Illness: Renee [name] is a 81 y.o. female   Pt with remote Hx laryngeal tumor (benign) removed at age 89 Has complained of sore throat and worsening dysphagia x 16 months 5/25 PET IMPRESSION: 1. Hypermetabolic thickening of the epiglottis which extends inferiorly on the RIGHT to the level of the arytenoid cartilage. 2. Three sub cm hypermetabolic RIGHT cervical lymph nodes consistent with nodal metastasis. There are two level II lymph nodes and one level III lymph node. 3. 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 Worsening dysphagia Percutaneous gastric tube placement per Dr [name] Last dose Eliquis 6/30  Past Medical History  Diagnosis Date  . IBS (irritable bowel syndrome)   . Coronary artery disease     a. CABG 09/2009. b. normal myoview in 05/2012.   [name] PAF (paroxysmal atrial fibrillation) ([place])     a. Post op after CABG. Intolerant of amio in the past. b. Recurrence - started on Tikosyn 11/2013.  [name] Dysphagia   . Hypertension   . HLD (hyperlipidemia)     can't take meds d/t joint aches/pains  . Chronic bronchitis ([place])   . Emphysema/COPD ([place])     on home O2  . Sciatic nerve pain   . Orthostatic hypotension   . Chronic respiratory failure ([place])   . Brain aneurysm   . Internal hemorrhoids   . PONV (postoperative nausea and vomiting)   . RBBB     takes Metoporolol daily  . Chronic diastolic CHF (congestive heart failure) (HCC)     was on Lasix but has been off for a few months  . Myocardial infarction [facility]) 2009    takes Eliquis daily.  . Emphysema lung ([place])   . Pneumonia     several yrs ago  . History of vertigo     doesn't take any meds    . Arthritis   . Osteoporosis   . Chronic back pain     buldging disc  . History of colon polyps     benign  . Hypothyroidism     takes Synthroid daily  . Macular degeneration     dry  . GERD (gastroesophageal reflux disease)     takes Nexium daily  . Anxiety     takes Xanax as needed  . History of shingles     Past Surgical History  Procedure Laterality Date  . Total hip arthroplasty Right 11/01/2008  . Appendectomy  1995  . Tonsillectomy and adenoidectomy    . Joint replacement    . Coronary artery bypass graft  09/21/2009    CABG X2  . Cardiac catheterization  1990's; 06/2009  . Cholecystectomy  2008  . Larynx surgery  ~ 1960    "tumor removed"  . Total abdominal hysterectomy  1986  . Tubal ligation  1973  . Cataract extraction w/ intraocular lens  implant, bilateral Bilateral   . Retinal laser procedure Left 10/19/2013    "had a wrinkle in it"  . Tee without cardioversion N/A 05/10/2014    Procedure: TRANSESOPHAGEAL ECHOCARDIOGRAM (TEE);  Surgeon: Sueanne Margarita, MD;  Location[facility] ENDOSCOPY;  Service: Cardiovascular;  Laterality: N/A;  . Atrial fibrillation ablation N/A 05/11/2014    Procedure: ATRIAL FIBRILLATION ABLATION;  Surgeon: Jeneen Rinks  Allred, MD;  Location: [facility] CATH LAB;  Service: Cardiovascular;  Laterality: N/A;  . Colonosocpy    . Esophagogastroduodenoscopy    . Direct laryngoscopy N/A 06/03/2015    Procedure: Direct laryngoscopy with biopsy left epiglottic lesion;  Surgeon: [name], MD;  Location: [facility] OR;  Service: ENT;  Laterality: N/A;    Allergies: Levofloxacin; Penicillins; Spiriva; Statins; Budesonide-formoterol fumarate; Morphine; Phenazopyridine hcl; Pneumococcal vaccines; Prednisone; Sulfa antibiotics; and Sulfonamide derivatives  Medications: Prior to Admission medications   Medication Sig Start Date End Date Taking? Authorizing Provider  acetaminophen (TYLENOL) 500 MG tablet Take 500 mg by mouth every 6 (six) hours as needed.   Yes Historical  Provider, MD  ALPRAZolam (XANAX) 0.25 MG tablet Take 0.25 mg by mouth 2 (two) times daily as needed for anxiety or sleep.   Yes Historical Provider, MD  cholecalciferol (VITAMIN D) 1000 UNITS tablet Take 1,000 Units by mouth daily.   Yes Historical Provider, MD  esomeprazole (NEXIUM) 40 MG capsule Take 40 mg by mouth daily before breakfast.     Yes Historical Provider, MD  HYDROcodone-acetaminophen (HYCET) 7.5-325 mg/15 ml solution Take 10-41mL every 4 hours as needed for pain. Take with food. 07/25/15  Yes Eppie Gibson, MD  levothyroxine (SYNTHROID, LEVOTHROID) 25 MCG tablet Take 25 mcg by mouth daily before breakfast.  03/02/13  Yes Historical Provider, MD  lidocaine (XYLOCAINE) 2 % solution Mix 1 part 2%viscous lidocaine,1part H2O.Swish and/or swallow 33mL of this mixture,30min before meals and at bedtime, up to QID 06/22/15  Yes Eppie Gibson, MD  metoprolol succinate (TOPROL-XL) 25 MG 24 hr tablet Take 1 tablet (25 mg total) by mouth daily. 03/28/15  Yes Deboraha Sprang, MD  OXYGEN Inhale 2 L into the lungs as needed (for shortness of breath).    Yes Historical Provider, MD  Polyethyl Glycol-Propyl Glycol (SYSTANE OP) Place 1 drop into both eyes 2 (two) times daily.    Yes Historical Provider, MD  senna (SENOKOT) 8.6 MG tablet Take 1 tablet by mouth daily.   Yes Historical Provider, MD  sodium fluoride (FLUORISHIELD) 1.1 % GEL dental gel Instill one drop of gel per tooth space of fluoride tray. Place over teeth for 5 minutes. Remove. Spit out excess. Repeat nightly. 06/27/15  Yes Lenn Cal, DDS  sucralfate (CARAFATE) 1 g tablet Dissolve 1 tablet in 13mL H2O and swallow up to QID,PRN sore throat. 06/22/15  Yes Eppie Gibson, MD  apixaban (ELIQUIS) 5 MG TABS tablet Take 1 tablet (5 mg total) by mouth 2 (two) times daily. 08/17/14   Thompson Grayer, MD  ipratropium-albuterol (DUONEB) 0.5-2.5 (3) MG/3ML SOLN Take 3 mLs by nebulization 4 (four) times daily. 06/15/15   Brand Males, MD  nitroGLYCERIN  (NITROSTAT) 0.4 MG SL tablet Place 0.4 mg under the tongue every 5 (five) minutes as needed for chest pain.    Historical Provider, MD  traMADol (ULTRAM) 50 MG tablet Take 1 tablet (50 mg total) by mouth every 6 (six) hours as needed. Pain Patient not taking: Reported on 08/01/2015 03/17/11   Posey Boyer, MD     Family History  Problem Relation Age of Onset  . Heart disease Mother   . Transient ischemic attack Mother   . Hypertension Mother   . Deep vein thrombosis Mother   . Heart attack Father   . Hypertension Father   . Diabetes Father   . Hyperlipidemia Father   . Breast cancer Sister   . Myasthenia gravis Brother   . Brain cancer Sister  mets from breast  . Colon polyps Brother   . Hypertension Brother   . Colon cancer Daughter   . Colon cancer Other     nephew    Social History   Social History  . Marital Status: Married    Spouse Name: N/A  . Number of Children: 1  . Years of Education: N/A   Occupational History  . nurse     retired   Social History Main Topics  . Smoking status: Former Smoker    Types: Cigarettes  . Smokeless tobacco: Never Used     Comment: quit smoking 7 yrs ago  . Alcohol Use: No  . Drug Use: No  . Sexual Activity: No   Other Topics Concern  . None   Social History Narrative         Review of Systems: A 12 point ROS discussed and pertinent positives are indicated in the HPI above.  All other systems are negative.  Review of Systems  Constitutional: Positive for appetite change and unexpected weight change. Negative for fever, activity change and fatigue.  HENT: Positive for sore throat and trouble swallowing.   Respiratory: Negative for cough and choking.   Neurological: Positive for weakness.  Psychiatric/Behavioral: Negative for behavioral problems and confusion.    Vital Signs: BP 150/101 mmHg  Pulse 107  Temp(Src) 98.4 F (36.9 C) (Oral)  Resp 16  Ht 5\' 6"  (1.676 m)  Wt 127 lb (57.607 kg)  BMI 20.51 kg/m2   SpO2 96%  Physical Exam  Constitutional: She is oriented to person, place, and time.  Thin/frail  Cardiovascular: Normal rate.   Murmur heard. Pulmonary/Chest: Effort normal and breath sounds normal. No respiratory distress.  Abdominal: Soft. Bowel sounds are normal. There is no tenderness.  Musculoskeletal: Normal range of motion.  Neurological: She is alert and oriented to person, place, and time.  Skin: Skin is warm and dry.  Psychiatric: She has a normal mood and affect. Her behavior is normal. Judgment and thought content normal.  Nursing note and vitals reviewed.   Mallampati Score:  MD Evaluation Airway: WNL Heart: WNL Abdomen: WNL Chest/ Lungs: WNL ASA  Classification: 3 Mallampati/Airway Score: One  Imaging: No results found.  Labs:  CBC:  Recent Labs  12/22/14 1151 06/02/15 1133 06/03/15 0621 08/08/15 0907  WBC 10.1 6.7  --  4.1  HGB 13.9 11.8* 12.2 12.1  HCT 42.8 38.3 36.0 40.6  PLT 400 462*  --  229    COAGS:  Recent Labs  08/08/15 0907  INR 1.11  APTT 33    BMP:  Recent Labs  12/22/14 1151 04/25/15 1104 06/02/15 1133 06/03/15 0621 08/08/15 0907  NA 138 140 140 142 139  K 4.3 4.1 2.7* 3.8 2.5*  CL 99 102 99*  --  100*  CO2 26 27 29   --  30  GLUCOSE 95 79 105* 102* 98  BUN 16 10 <5*  --  <5*  CALCIUM 9.9 8.8 8.5*  --  8.7*  CREATININE 1.19* 0.96* 0.95  --  0.88  GFRNONAA  --   --  56*  --  >60  GFRAA  --   --  >60  --  >60    LIVER FUNCTION TESTS: No results for input(s): BILITOT, AST, ALT, ALKPHOS, PROT, ALBUMIN in the last 8760 hours.  TUMOR MARKERS: No results for input(s): AFPTM, CEA, CA199, CHROMGRNA in the last 8760 hours.  Assessment and Plan:  Epiglottic Cancer Low potassium Scheduled for percutaneous gastric  tube placement Risks and Benefits discussed with the patient including, but not limited to the need for a barium enema during the procedure, bleeding, infection, peritonitis, or damage to adjacent  structures. All of the patient's questions were answered, patient is agreeable to proceed. Consent signed and in chart.  Potassium  Replacement per Dr Barbie Banner   Thank you for this interesting consult.  I greatly enjoyed meeting TOMASINA NAVIA and look forward to participating in their care.  A copy of this report was sent to the requesting provider on this date.  Electronically Signed: Aristide Waggle A 08/08/2015, 10:11 AM   I spent a total of  30 Minutes   in face to face in clinical consultation, greater than 50% of which was counseling/coordinating care for percutaneous gastric tube placement

## 2015-08-08 NOTE — Progress Notes (Signed)
C/o headache, Dr Barbie Banner paged for orders.

## 2015-08-08 NOTE — Progress Notes (Signed)
BRIEF ONCOLOGIC HISTORY:    Carcinoma of epiglottis ([place])   05/10/2015 Imaging CT neck: Nodularity of (L) epiglottis could represent mass or polyp. Direct visualization & possible biopsy recommended. No adenopathy.    05/20/2015 Imaging CT neck: Nodularity of (L) epiglottis could represent a mass of polyp. No adenopathy.    06/03/2015 Initial Biopsy Direct laryngoscopy Renee [name]). Epiglottis biopsy: Squamous cell carcinoma.  (R) lateral wall larynx biopsy: Squamous cell carcinoma, moderately to poorly differentiated. p16 strongly (+) on both specimens.    06/24/2015 Initial Diagnosis Carcinoma of epiglottis (Renee [name])   06/30/2015 PET scan Hypermetabolic thickening of epiglottis which extends inferiorly on (R) to level of arytenoid cartilage. 3 sub-cm hypermetabolic (R) cervical LNs consistent with nodal  mets. 2 level 2 and 1 level 3 LN. No distant mets.    07/19/2015 -  Radiation Therapy Currently undergoing IMRT [name]).      INTERVAL HISTORY:  As of today, Renee [name] has completed 15 of 35 planned fractions of radiation for epiglottic cancer.  I am seeing her today for acute survivorship and to address any concerning symptoms while currently undergoing radiation therapy.    She had her PEG tube placed on Monday, which has been sore.  She takes pain medication for the pain as needed.  The skin on her neck is itching; she forgot to apply her radiation lotion today.  She has been having some nausea since she started radiation, which has worsened since she got her feeding tube placed.  Regarding her tube feedings, she tells me that she only instilled 1 can yesterday "and I know that's bad that I didn't do more."  She stated that she had some reflux of the tube feeding into her throat after the 1 can, so she did not instill any more.  Today, she has instilled 2 cans with 240 ml water.   She is scheduled to see Dory Peru, RD tomorrow for follow-up.    Pain: Sore throat and G-tube site; managed with current  prescriptions   Nutritional Status:  -Intake/Diet: Tube feeding; some oral intake, but not much -Using a feeding tube? Yes -Weight change: LOSS ~2 lbs since 08/05/15  Swallowing:  Some pain    ADDITIONAL REVIEW OF SYSTEMS:  Review of Systems  Constitutional: Positive for malaise/fatigue.       (+) fatigue   HENT: Positive for sore throat.   Respiratory: Positive for sputum production.   Gastrointestinal: Positive for nausea. Negative for diarrhea and constipation.  Genitourinary: Negative for dysuria.  Skin: Positive for itching.       Neck itching in radiation fields  Neurological: Negative for dizziness.     CURRENT MEDICATIONS:  Current Outpatient Prescriptions on File Prior to Visit  Medication Sig Dispense Refill  . acetaminophen (TYLENOL) 500 MG tablet Take 500 mg by mouth every 6 (six) hours as needed.    . ALPRAZolam (XANAX) 0.25 MG tablet Take 0.25 mg by mouth 2 (two) times daily as needed for anxiety or sleep.    [name] apixaban (ELIQUIS) 5 MG TABS tablet Take 1 tablet (5 mg total) by mouth 2 (two) times daily. 60 tablet 1  . cholecalciferol (VITAMIN D) 1000 UNITS tablet Take 1,000 Units by mouth daily.    [name] esomeprazole (NEXIUM) 40 MG capsule Take 40 mg by mouth daily before breakfast.      . HYDROcodone-acetaminophen (HYCET) 7.5-325 mg/15 ml solution Take 10-29mL every 4 hours as needed for pain. Take with food. 500 mL 0  . ipratropium-albuterol (DUONEB)  0.5-2.5 (3) MG/3ML SOLN Take 3 mLs by nebulization 4 (four) times daily. 360 mL 11  . levothyroxine (SYNTHROID, LEVOTHROID) 25 MCG tablet Take 25 mcg by mouth daily before breakfast.     . lidocaine (XYLOCAINE) 2 % solution Mix 1 part 2%viscous lidocaine,1part H2O.Swish and/or swallow 97mL of this mixture,2min before meals and at bedtime, up to QID 100 mL 5  . metoprolol succinate (TOPROL-XL) 25 MG 24 hr tablet Take 1 tablet (25 mg total) by mouth daily.    . nitroGLYCERIN (NITROSTAT) 0.4 MG SL tablet Place 0.4 mg under the  tongue every 5 (five) minutes as needed for chest pain.    . Nutritional Supplements (FEEDING SUPPLEMENT, OSMOLITE 1.5 CAL,) LIQD Give 1 can of Osmolite 1.5 or equivalent - 5 times daily with 60 cc free water before and after bolus feeding via PEG.  Give additional 240 cc water BID. Send formula and supplies. 1185 mL 0  . OXYGEN Inhale 2 L into the lungs as needed (for shortness of breath).     [name] Glycol-Propyl Glycol (SYSTANE OP) Place 1 drop into both eyes 2 (two) times daily.     [name] senna (SENOKOT) 8.6 MG tablet Take 1 tablet by mouth daily.    . sodium fluoride (FLUORISHIELD) 1.1 % GEL dental gel Instill one drop of gel per tooth space of fluoride tray. Place over teeth for 5 minutes. Remove. Spit out excess. Repeat nightly. 120 mL prn  . sucralfate (CARAFATE) 1 g tablet Dissolve 1 tablet in 56mL H2O and swallow up to QID,PRN sore throat. 60 tablet 5  . traMADol (ULTRAM) 50 MG tablet Take 1 tablet (50 mg total) by mouth every 6 (six) hours as needed. Pain (Patient not taking: Reported on 08/01/2015) 30 tablet 0  . [DISCONTINUED] budesonide-formoterol (SYMBICORT) 160-4.5 MCG/ACT inhaler Inhale 2 puffs into the lungs 2 (two) times daily. 1 Inhaler 12  . [DISCONTINUED] calcium citrate-vitamin D (CITRACAL+D) 315-200 MG-UNIT per tablet Take 1 tablet by mouth 2 (two) times daily.       Current Facility-Administered Medications on File Prior to Visit  Medication Dose Route Frequency Provider Last Rate Last Dose  . 0.9 %  sodium chloride infusion   Intravenous Once Monia Sabal, PA-C      . fentaNYL (SUBLIMAZE) 100 MCG/2ML injection           . fentaNYL (SUBLIMAZE) 100 MCG/2ML injection           . glucagon (human recombinant) (GLUCAGEN) 1 MG injection           . lidocaine (XYLOCAINE) 1 % (with pres) injection        20 mL at 08/08/15 1424  . midazolam (VERSED) 2 MG/2ML injection           . midazolam (VERSED) 2 MG/2ML injection           . potassium chloride 10 mEq in 100 mL IVPB  10 mEq  Intravenous Q1 Hr x 2 Marybelle Killings, MD      . vancomycin (VANCOCIN) 1-5 GM/200ML-% IVPB           . vancomycin (VANCOCIN) IVPB 1000 mg/200 mL premix  1,000 mg Intravenous to XRAY Monia Sabal, PA-C        ALLERGIES:  Allergies  Allergen Reactions  . Levofloxacin Nausea Only    Unable to tolerate more than 7 days  . Penicillins Swelling and Rash    Swelling location undefined. Has patient had a PCN reaction causing immediate rash, facial/tongue/throat  swelling, SOB or lightheadedness with hypotension: Yes Has patient had a PCN reaction causing severe rash involving mucus membranes or skin necrosis: No Has patient had a PCN reaction that required hospitalization Unk Has patient had a PCN reaction occurring within the last 10 years: Unk If all of the above answers are "NO", then may proceed with Cephalosporin use.   [name] Spiriva [Tiotropium Bromide Monohydrate] Other (See Comments)    Throat irritation  . Statins Other (See Comments)    Myalgias  . Budesonide-Formoterol Fumarate Other (See Comments)    Tachycardia  . Morphine Other (See Comments)    double vision  . Phenazopyridine Hcl Other (See Comments)    *piridium* face red  . Pneumococcal Vaccines Swelling    [place] of the skin  . Prednisone Swelling    Can't take PO but injectable is fine  . Sulfa Antibiotics Rash  . Sulfonamide Derivatives Rash     PHYSICAL EXAM:  Filed Vitals:   08/10/15 1425 08/10/15 1428  BP: 155/86 109/69  Pulse: 98 101  Temp: 97.9 F (36.6 C)    *Orthostatic VS as above.  Weight Date  127 lb 12.8 oz (57.97 kg) 08/10/15  129 lb 4.8 oz (58.65 kg) 08/05/15  132 lb 1.6 oz (59.92 kg) 08/01/15  128 lb 1.6 oz (58.106 kg) 07/25/15  129 lb 1.6 oz (58.559 kg) Pre-treatment: 06/22/15   General: Female in no acute distress.  Accompanied by her husband today.  HEENT: Head normocephalic.  Pupils equal and reactive to light. Conjunctivae clear without exudate.  Sclerae anicteric. Oral mucosa is pink.  Tongue  pink with (+) patchy mucositis vs early thrush; non-tender.  Oropharynx is erythematous.  Neck: Skin on neck is mildly red and hyperpigmented. No desquamation.   Cardiovascular: Normal rate and rhythm. Respiratory: RUL with mild rhonchi, cleared with cough.  Otherwise, clear to auscultation bilaterally. Breathing non-labored. GI: Abdomen soft and round. Bowel sounds normoactive. G-tube in place; dressing clean, dry, & intact.  GU: Deferred.   Neuro: No focal deficits. Steady gait.   Psych: Normal mood and affect for situation. Extremities: No edema. Skin: Warm and dry.    LABORATORY DATA:  CMET, Mg, & Phos pending to monitor for refeeding syndrome.   DIAGNOSTIC IMAGING:  None at this visit.    ASSESSMENT & PLAN:  Ms. Mcniece is a pleasant 81 y.o. 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  Completed fraction #15/35 today. Presents to clinic today for close follow-up during treatment and to address any acute survivorship needs.   1. Cancer of the epiglottis:  Ms. Marsicano is tolerating treatment as expected. She will continue IMRT treatment as planned, per Dr. [name].  The patient will complete #15/35 fractions today.  She will see Dr. [name] for routine PUT visit next week.    2. Nausea: Her complaints of nausea are likely multifactorial given her recent G-tube placement and initiation of tube feeds, as well as the production of excess oral secretions as a result of treatment.  In some patients, swallowing the excess phlegm leads to nausea.  I provided her with a prescription for Zofran 4 mg po Q8Hprn for nausea, #50, 3 refills. We discussed the possible side effects of Zofran, which include constipation.  Generally, this is less likely when taken on an as needed basis, but I encouraged her to continue her stool softener nonetheless. She agreed with this plan.   3. Fatigue: Her fatigue is likely stemming from her  cancer diagnosis,  subsequent treatment, and protein-calorie malnutrition.  I encouraged her to increase her tube feedings as directed by Dory Peru, our oncology dietitian.  I also encouraged her to practice energy conservation, while also being physically active during treatment.  Patients who maintain physical activity during treatment are more likely to maintain their stamina and muscle conditioning, leading to easier recovery and healing.    4. Orthostatic hypotension/Dehydration: Ms. Urben did have orthostatic hypotension today in clinic. She is asymptomatic. This is likely related to her lack of nutrition in recent days.  I encouraged her to continue to consume what she is able to by mouth, in addition to her tube feeding regimen.  She also has a history of diastolic heart failure and is on a beta blocker, which also may be contributing some to her hypotension.  I am hopeful her orthostasis will improve with better nutrition/water flushes as directed by our dietitian.   5. At risk for refeeding syndrome: I explained the rationale for collecting lab studies today to monitor for refeeding syndrome in head & neck cancer patients.  CMET, Mg, and Phos labs are pending for today.  We discussed that if she needs supplementation of any electrolytes that I will provide the appropriate prescriptions for her.  She agreed with this plan.   6. Pain: Continue current pain regimen.   7. Constipation prevention: Continue bowel regimen with stool softener. May consider adding Miralax in the future if constipation becomes an issue in light of opiate and Zofran use.     Dispo:  -See Dr. [name] next week for routine PUT visit.  -Call me with any questions or concerns.   A total of 20 minutes was spent in face-to-face care of this patient with greater than 50% of that time spent in counseling & care-coordination.   Mike Craze, NP [place] [phone]

## 2015-08-08 NOTE — Progress Notes (Deleted)
BRIEF ONCOLOGIC HISTORY:   No history exists.     INTERVAL HISTORY:  ***  Pain:   Nutritional Status:  -Intake/Diet:  -Using a feeding tube? Yes/No -Weight change: (loss/gain) since (date)  Swallowing:      ADDITIONAL REVIEW OF SYSTEMS:  ROS   CURRENT MEDICATIONS:  Current Outpatient Prescriptions on File Prior to Visit  Medication Sig Dispense Refill  . acetaminophen (TYLENOL) 500 MG tablet Take 500 mg by mouth every 6 (six) hours as needed.    . ALPRAZolam (XANAX) 0.25 MG tablet Take 0.25 mg by mouth 2 (two) times daily as needed for anxiety or sleep.    [name] apixaban (ELIQUIS) 5 MG TABS tablet Take 1 tablet (5 mg total) by mouth 2 (two) times daily. 60 tablet 1  . cholecalciferol (VITAMIN D) 1000 UNITS tablet Take 1,000 Units by mouth daily.    [name] esomeprazole (NEXIUM) 40 MG capsule Take 40 mg by mouth daily before breakfast.      . HYDROcodone-acetaminophen (HYCET) 7.5-325 mg/15 ml solution Take 10-21mL every 4 hours as needed for pain. Take with food. 500 mL 0  . ipratropium-albuterol (DUONEB) 0.5-2.5 (3) MG/3ML SOLN Take 3 mLs by nebulization 4 (four) times daily. 360 mL 11  . levothyroxine (SYNTHROID, LEVOTHROID) 25 MCG tablet Take 25 mcg by mouth daily before breakfast.     . lidocaine (XYLOCAINE) 2 % solution Mix 1 part 2%viscous lidocaine,1part H2O.Swish and/or swallow 32mL of this mixture,72min before meals and at bedtime, up to QID 100 mL 5  . metoprolol succinate (TOPROL-XL) 25 MG 24 hr tablet Take 1 tablet (25 mg total) by mouth daily.    . nitroGLYCERIN (NITROSTAT) 0.4 MG SL tablet Place 0.4 mg under the tongue every 5 (five) minutes as needed for chest pain.    . OXYGEN Inhale 2 L into the lungs as needed (for shortness of breath).     [name] Glycol-Propyl Glycol (SYSTANE OP) Place 1 drop into both eyes 2 (two) times daily.     [name] senna (SENOKOT) 8.6 MG tablet Take 1 tablet by mouth daily.    . sodium fluoride (FLUORISHIELD) 1.1 % GEL dental gel Instill one  drop of gel per tooth space of fluoride tray. Place over teeth for 5 minutes. Remove. Spit out excess. Repeat nightly. 120 mL prn  . sucralfate (CARAFATE) 1 g tablet Dissolve 1 tablet in 78mL H2O and swallow up to QID,PRN sore throat. 60 tablet 5  . traMADol (ULTRAM) 50 MG tablet Take 1 tablet (50 mg total) by mouth every 6 (six) hours as needed. Pain (Patient not taking: Reported on 08/01/2015) 30 tablet 0  . [DISCONTINUED] budesonide-formoterol (SYMBICORT) 160-4.5 MCG/ACT inhaler Inhale 2 puffs into the lungs 2 (two) times daily. 1 Inhaler 12  . [DISCONTINUED] calcium citrate-vitamin D (CITRACAL+D) 315-200 MG-UNIT per tablet Take 1 tablet by mouth 2 (two) times daily.       Current Facility-Administered Medications on File Prior to Visit  Medication Dose Route Frequency Provider Last Rate Last Dose  . 0.9 %  sodium chloride infusion   Intravenous Once Monia Sabal, PA-C      . potassium chloride 10 mEq in 100 mL IVPB  10 mEq Intravenous Q1 Hr x 2 Monia Sabal, PA-C      . vancomycin (VANCOCIN) IVPB 1000 mg/200 mL premix  1,000 mg Intravenous to XRAY Monia Sabal, PA-C        ALLERGIES:  Allergies  Allergen Reactions  . Levofloxacin Nausea Only    Unable  to tolerate more than 7 days  . Penicillins Swelling and Rash    Swelling location undefined. Has patient had a PCN reaction causing immediate rash, facial/tongue/throat swelling, SOB or lightheadedness with hypotension: Yes Has patient had a PCN reaction causing severe rash involving mucus membranes or skin necrosis: No Has patient had a PCN reaction that required hospitalization Unk Has patient had a PCN reaction occurring within the last 10 years: Unk If all of the above answers are "NO", then may proceed with Cephalosporin use.   [name] Spiriva [Tiotropium Bromide Monohydrate] Other (See Comments)    Throat irritation  . Statins Other (See Comments)    Myalgias  . Budesonide-Formoterol Fumarate Other (See Comments)    Tachycardia  .  Morphine Other (See Comments)    double vision  . Phenazopyridine Hcl Other (See Comments)    *piridium* face red  . Pneumococcal Vaccines Swelling    Redding of the skin  . Prednisone Swelling    Can't take PO but injectable is fine  . Sulfa Antibiotics Rash  . Sulfonamide Derivatives Rash     PHYSICAL EXAM:  There were no vitals filed for this visit.  Weight Date                     Pre-treatment (RT consult date):    General: Well-nourished, well-appearing female/female*** in no acute distress.  Accompanied/Unaccompanied today.  HEENT: Head is atraumatic and normocephalic.  Pupils equal and reactive to light. Conjunctivae clear without exudate.  Sclerae anicteric. Oral mucosa is pink and moist without lesions.  Tongue pink, moist, and midline. Oropharynx is pink and moist, without lesions. Lymph: No preauricular, postauricular, supraclavicular, or infraclavicular lymphadenopathy noted on palpation.   Neck: No palpable masses. Skin on neck is ***.  Cardiovascular: Normal rate and rhythm. Respiratory: Clear to auscultation bilaterally. Chest expansion symmetric without accessory muscle use on inspiration or expiration.   GI: Abdomen soft and round. No tenderness to palpation. Bowel sounds normoactive in 4 quadrants. No hepatosplenomegaly.  GU: Deferred.   Neuro: No focal deficits. Steady gait.   Psych: Normal mood and affect for situation. Extremities: No edema, cyanosis, or clubbing.   Skin: Warm and dry.    LABORATORY DATA:  None at this visit.***  DIAGNOSTIC IMAGING:  None at this visit.    ASSESSMENT & PLAN:  Mr./Ms.*** Roso is a pleasant 81 y.o. female/female*** with ***, currently undergoing IMRT.  Completed fraction #**/35 today. Presents to clinic for today for complaints of ***.   1. Cancer***:  Mr. Mrs. [name] will continue IMRT treatment as planned, per Dr. [name]***.  The patient will complete #**/35 fractions today.  He will see Dr. [name]*** for routine  PUT visit next week.    2. Problem at visit:      Mike Craze, NP [place] [phone]

## 2015-08-08 NOTE — Sedation Documentation (Signed)
Patient is resting comfortably. 

## 2015-08-08 NOTE — Discharge Instructions (Signed)
Plus review peg tube instructions from [facility] from the cancer center GI  instructor.   Gastrostomy Tube Home Guide, Adult A gastrostomy tube is a tube that is surgically placed into the stomach. It is also called a "G-tube." G-tubes are used when a person is unable to eat and drink enough on their own to stay healthy. The tube is inserted into the stomach through a small cut (incision) in the skin. This tube is used for:  Feeding.  Giving medication. GASTROSTOMY TUBE CARE  Wash your hands with soap and water.  Remove the old dressing (if any). Some styles of G-tubes may need a dressing inserted between the skin and the G-tube. Other types of G-tubes do not require a dressing. Ask your health care provider if a dressing is needed.  Check the area where the tube enters the skin (insertion site) for redness, swelling, or pus-like (purulent) drainage. A small amount of clear or tan liquid drainage is normal. Check to make sure scar tissue (skin) is not growing around the insertion site. This could have a raised, bumpy appearance.  A cotton swab can be used to clean the skin around the tube:  When the G-tube is first put in, a normal saline solution or water can be used to clean the skin.  Mild soap and warm water can be used when the skin around the G-tube site has healed.  Roll the cotton swab around the G-tube insertion site to remove any drainage or crusting at the insertion site. STOMACH RESIDUALS Feeding tube residuals are the amount of liquids that are in the stomach at any given time. Residuals may be checked before giving feedings, medications, or as instructed by your health care provider.  Ask your health care provider if there are instances when you would not start tube feedings depending on the amount or type of contents withdrawn from the stomach.  Check residuals by attaching a syringe to the G-tube and pulling back on the syringe plunger. Note the amount, and return the residual  back into the stomach. FLUSHING THE G-TUBE  The G-tube should be periodically flushed with clean warm water to keep it from clogging.  Flush the G-tube after feedings or medications. Draw up 30 mL of warm water in a syringe. Connect the syringe to the G-tube and slowly push the water into the tube.  Do not push feedings, medications, or flushes rapidly. Flush the G-tube gently and slowly.  Only use syringes made for G-tubes to flush medications or feedings.  Your health care provider may want the G-tube flushed more often or with more water. If this is the case, follow your health care provider's instructions. FEEDINGS Your health care provider will determine whether feedings are given as a bolus (a certain amount given at one time and at scheduled times) or whether feedings will be given continuously on a feeding pump.   Formulas should be given at room temperature.  If feedings are continuous, no more than 4 hours worth of feedings should be placed in the feeding bag. This helps prevent spoilage or accidental excess infusion.  Cover and place unused formula in the refrigerator.  If feedings are continuous, stop the feedings when medications or flushes are given. Be sure to restart the feedings.  Feeding bags and syringes should be replaced as instructed by your health care provider. GIVING MEDICATION   In general, it is best if all medications are in a liquid form for G-tube administration. Liquid medications are less likely  to clog the G-tube.  Mix the liquid medication with 30 mL (or amount recommended by your health care provider) of warm water.  Draw up the medication into the syringe.  Attach the syringe to the G-tube and slowly push the mixture into the G-tube.  After giving the medication, draw up 30 mL of warm water in the syringe and slowly flush the G-tube.  For pills or capsules, check with your health care provider first before crushing medications. Some pills are  not effective if they are crushed. Some capsules are sustained-release medications.  If appropriate, crush the pill or capsule and mix with 30 mL of warm water. Using the syringe, slowly push the medication through the tube, then flush the tube with another 30 mL of tap water. G-TUBE PROBLEMS G-tube was pulled out.  Cause: May have been pulled out accidentally.  Solutions: Cover the opening with clean dressing and tape. Call your health care provider right away. The G-tube should be put in as soon as possible (within 4 hours) so the G-tube opening (tract) does not close. The G-tube needs to be put in at a health care setting. An X-ray needs to be done to confirm placement before the G-tube can be used again. Redness, irritation, soreness, or foul odor around the gastrostomy site.  Cause: May be caused by leakage or infection.  Solutions: Call your health care provider right away. Large amount of leakage of fluid or mucus-like liquid present (a large amount means it soaks clothing).  Cause: Many reasons could cause the G-tube to leak.  Solutions: Call your health care provider to discuss the amount of leakage. Skin or scar tissue appears to be growing where tube enters skin.   Cause: Tissue growth may develop around the insertion site if the G-tube is moved or pulled on excessively.  Solutions: Secure tube with tape so that excess movement does not occur. Call your health care provider. G-tube is clogged.  Cause: Thick formula or medication.  Solutions: Try to slowly push warm water into the tube with a large syringe. Never try to push any object into the tube to unclog it. Do not force fluid into the G-tube. If you are unable to unclog the tube, call your health care provider right away. TIPS  Head of bed (HOB) position refers to the upright position of a person's upper body.  When giving medications or a feeding bolus, keep the [facility] up as told by your health care provider. Do this  during the feeding and for 1 hour after the feeding or medication administration.  If continuous feedings are being given, it is best to keep the [facility] up as told by your health care provider. When ADLs (activities of daily living) are performed and the [facility] needs to be flat, be sure to turn the feeding pump off. Restart the feeding pump when the [facility] is returned to the recommended height.  Do not pull or put tension on the tube.  To prevent fluid backflow, kink the G-tube before removing the cap or disconnecting a syringe.  Check the G-tube length every day. Measure from the insertion site to the end of the G-tube. If the length is longer than previous measurements, the tube may be coming out. Call your health care provider if you notice increasing G-tube length.  Oral care, such as brushing teeth, must be continued.  You may need to remove excess air (vent) from the G-tube. Your health care provider will tell you if this is  needed.  Always call your health care provider if you have questions or problems with the G-tube. SEEK IMMEDIATE MEDICAL CARE IF:   You have severe abdominal pain, tenderness, or abdominal bloating (distension).  You have nausea or vomiting.  You are constipated or have problems moving your bowels.  The G-tube insertion site is red, swollen, has a foul smell, or has yellow or brown drainage.  You have difficulty breathing or shortness of breath.  You have a fever.  You have a large amount of feeding tube residuals.  The G-tube is clogged and cannot be flushed. MAKE SURE YOU:   Understand these instructions.  Will watch your condition.  Will get help right away if you are not doing well or get worse.   This information is not intended to replace advice given to you by your health care provider. Make sure you discuss any questions you have with your health care provider.   Document Released: 04/02/2001 Document Revised: 06/08/2014 Document Reviewed:  09/29/2012 Elsevier Interactive Patient Education [employer].

## 2015-08-10 ENCOUNTER — Ambulatory Visit (HOSPITAL_BASED_OUTPATIENT_CLINIC_OR_DEPARTMENT_OTHER): Payer: Medicare Other | Admitting: Adult Health

## 2015-08-10 ENCOUNTER — Telehealth: Payer: Self-pay | Admitting: Internal Medicine

## 2015-08-10 ENCOUNTER — Encounter: Payer: Self-pay | Admitting: Adult Health

## 2015-08-10 ENCOUNTER — Other Ambulatory Visit (HOSPITAL_BASED_OUTPATIENT_CLINIC_OR_DEPARTMENT_OTHER): Payer: Medicare Other

## 2015-08-10 ENCOUNTER — Ambulatory Visit
Admission: RE | Admit: 2015-08-10 | Discharge: 2015-08-10 | Disposition: A | Discharge status: Home / Self Care | Payer: Medicare Other | Source: Ambulatory Visit | Attending: Radiation Oncology | Admitting: Radiation Oncology

## 2015-08-10 VITALS — BP 109/69 | HR 101 | Temp 97.9°F | Wt 127.8 lb

## 2015-08-10 DIAGNOSIS — E46 Unspecified protein-calorie malnutrition: Secondary | ICD-10-CM

## 2015-08-10 DIAGNOSIS — R110 Nausea: Secondary | ICD-10-CM | POA: Diagnosis not present

## 2015-08-10 DIAGNOSIS — C321 Malignant neoplasm of supraglottis: Secondary | ICD-10-CM

## 2015-08-10 DIAGNOSIS — Z87891 Personal history of nicotine dependence: Secondary | ICD-10-CM | POA: Diagnosis not present

## 2015-08-10 DIAGNOSIS — J449 Chronic obstructive pulmonary disease, unspecified: Secondary | ICD-10-CM

## 2015-08-10 DIAGNOSIS — Z510 Encounter for antineoplastic radiation therapy: Secondary | ICD-10-CM | POA: Diagnosis not present

## 2015-08-10 DIAGNOSIS — C320 Malignant neoplasm of glottis: Secondary | ICD-10-CM | POA: Diagnosis not present

## 2015-08-10 DIAGNOSIS — R112 Nausea with vomiting, unspecified: Secondary | ICD-10-CM

## 2015-08-10 LAB — COMPREHENSIVE METABOLIC PANEL
ALBUMIN: 3 g/dL — AB (ref 3.5–5.0)
ALK PHOS: 143 U/L (ref 40–150)
ANION GAP: 13 meq/L — AB (ref 3–11)
AST: 17 U/L (ref 5–34)
BUN: 8.9 mg/dL (ref 7.0–26.0)
CALCIUM: 9.2 mg/dL (ref 8.4–10.4)
CHLORIDE: 95 meq/L — AB (ref 98–109)
CO2: 29 mEq/L (ref 22–29)
CREATININE: 0.9 mg/dL (ref 0.6–1.1)
EGFR: 65 mL/min/{1.73_m2} — ABNORMAL LOW (ref >90)
Glucose: 93 mg/dl (ref 70–140)
POTASSIUM: 3.1 meq/L — AB (ref 3.5–5.1)
Sodium: 137 mEq/L (ref 136–145)
Total Bilirubin: 0.7 mg/dL (ref 0.20–1.20)
Total Protein: 7.2 g/dL (ref 6.4–8.3)

## 2015-08-10 LAB — POCT I-STAT 4, (NA,K, GLUC, HGB,HCT)
GLUCOSE: 87 mg/dL (ref 65–99)
HEMATOCRIT: 34 % — AB (ref 36.0–46.0)
Hemoglobin: 11.6 g/dL — ABNORMAL LOW (ref 12.0–15.0)
Potassium: 3.1 mmol/L — ABNORMAL LOW (ref 3.5–5.1)
SODIUM: 140 mmol/L (ref 135–145)

## 2015-08-10 LAB — MAGNESIUM: MAGNESIUM: 1.8 mg/dL (ref 1.5–2.5)

## 2015-08-10 MED ORDER — ONDANSETRON HCL 4 MG PO TABS: 4.0000 mg | ORAL_TABLET | Freq: Three times a day (TID) | ORAL | Status: AC | PRN

## 2015-08-10 NOTE — Patient Instructions (Signed)
Your prescription for ondansetron (Zofran) for nausea was sent to your pharmacy.  Take as needed.  I will call you if we need to add any supplements (potassium, etc) after we get the results of your lab work back.   You will see Raford Pitcher tomorrow to further discuss your tube feedings. Try pre-medicating with pain medicine before your feeding tube dressing changes to see if that will help with the discomfort.  This will continue to heal and improve with time.   Keep up the great work & call me with any concerns! Mike Craze, NP

## 2015-08-10 NOTE — Telephone Encounter (Signed)
Spoke with pt and she has been diagnosed with throat cancer and has not used the nebulizer. She would like to discontinue. She states that she has not needed it and is doing well as far as breathing is concerned.   MR - FYI above. Thanks!

## 2015-08-10 NOTE — Telephone Encounter (Signed)
Lmtcb x1

## 2015-08-10 NOTE — Telephone Encounter (Signed)
Sorry to hear. Best wishes for speedy recovery. Ok to [facility]

## 2015-08-10 NOTE — Telephone Encounter (Signed)
Pt returning call . pls call back

## 2015-08-11 ENCOUNTER — Ambulatory Visit
Admission: RE | Admit: 2015-08-11 | Discharge: 2015-08-11 | Disposition: A | Discharge status: Home / Self Care | Payer: Medicare Other | Source: Ambulatory Visit | Attending: Radiation Oncology | Admitting: Radiation Oncology

## 2015-08-11 ENCOUNTER — Ambulatory Visit: Payer: Medicare Other | Admitting: Nutrition

## 2015-08-11 ENCOUNTER — Other Ambulatory Visit: Payer: Self-pay | Admitting: Adult Health

## 2015-08-11 ENCOUNTER — Encounter: Payer: Self-pay | Admitting: Adult Health

## 2015-08-11 DIAGNOSIS — Z87891 Personal history of nicotine dependence: Secondary | ICD-10-CM | POA: Diagnosis not present

## 2015-08-11 DIAGNOSIS — C321 Malignant neoplasm of supraglottis: Secondary | ICD-10-CM | POA: Diagnosis not present

## 2015-08-11 DIAGNOSIS — E876 Hypokalemia: Secondary | ICD-10-CM

## 2015-08-11 DIAGNOSIS — Z510 Encounter for antineoplastic radiation therapy: Secondary | ICD-10-CM | POA: Diagnosis not present

## 2015-08-11 DIAGNOSIS — C320 Malignant neoplasm of glottis: Secondary | ICD-10-CM | POA: Diagnosis not present

## 2015-08-11 LAB — PHOSPHORUS: Phosphorus, Ser: 2.7 mg/dL (ref 2.5–4.5)

## 2015-08-11 MED ORDER — POTASSIUM CHLORIDE 40 MEQ/15ML (20%) PO SOLN: 15.0000 mL | Freq: Two times a day (BID) | ORAL | Status: DC

## 2015-08-11 NOTE — Telephone Encounter (Signed)
Called spoke with pt. Informed her of MR's recs. She voiced understanding and had no further questions. Order placed, nothing further needed.

## 2015-08-11 NOTE — Progress Notes (Signed)
Nutrition follow-up completed with patient and husband.  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 Weight decreased and documented as 127.8 pounds July 5, down from 132.1 pounds June 26. Patient states she has a lot of difficulty trying to eat but is able to sip on water and coffee. Patient is using Osmolite 1.5 one half can every 3 hours and tolerated a total of 2 cans yesterday. So far today, patient has tolerated a half a can twice a day. Noted potassium still low at 3.1.  Magnesium and phosphorus with were within normal limits.  Estimated nutrition needs: 1700-2000 calories, 70-85 grams protein, 2 L fluid.  Nutrition diagnosis: Inadequate oral intake continues.  Intervention: Educated patient to continue Osmolite 1.5 and try one half can 5 times daily every 3 hours. Patient is agreeable to increasing Osmolite 1.5 on Sunday to goal rate of 5 cans daily providing 1775 mL calories, 74.5 g protein, 905 mL fluid Patient is using approximately 60 cc of free water before and after bolus feedings plus an additional 240 cc twice a day. Recommend checking CMET, magnesium, and phosphorus 2 times next week, possibly Monday and Thursday. Questions answered.  Teach back method used.  Monitoring, evaluation, goals: Patient will work to increase tube feedings to goal rate to minimize weight loss.  Next visit: Monday, July 10.  **Disclaimer: This note was dictated with voice recognition software. Similar sounding words can inadvertently be transcribed and this note may contain transcription errors which may not have been corrected upon publication of note.**

## 2015-08-11 NOTE — Progress Notes (Signed)
Paper prescription for KCl solution given to Renee [name], RD to give to Renee [name] today for recent serum K of 3.1.    She will take Potassium Chloride 40 mEq/68mLs via tube twice daily to total 80 mEq per day.   Patient should not take her previously prescribed potassium, as we do not have record of the dose or type of potassium (short vs. long-acting) and do not want her to receive too much potassium.   Otherwise her labs were largely stable.  We will continue to monitor labs as recommended by Renee [name], RD as patient remains at risk for refeeding syndrome.   Patient can call me with any questions.   Mike Craze, NP [place] [phone]

## 2015-08-12 ENCOUNTER — Ambulatory Visit
Admission: RE | Admit: 2015-08-12 | Discharge: 2015-08-12 | Disposition: A | Discharge status: Home / Self Care | Payer: Medicare Other | Source: Ambulatory Visit | Attending: Radiation Oncology | Admitting: Radiation Oncology

## 2015-08-12 DIAGNOSIS — C321 Malignant neoplasm of supraglottis: Secondary | ICD-10-CM | POA: Diagnosis not present

## 2015-08-12 DIAGNOSIS — Z510 Encounter for antineoplastic radiation therapy: Secondary | ICD-10-CM | POA: Diagnosis not present

## 2015-08-12 DIAGNOSIS — C320 Malignant neoplasm of glottis: Secondary | ICD-10-CM | POA: Diagnosis not present

## 2015-08-12 DIAGNOSIS — Z87891 Personal history of nicotine dependence: Secondary | ICD-10-CM | POA: Diagnosis not present

## 2015-08-15 ENCOUNTER — Ambulatory Visit
Admit: 2015-08-15 | Discharge: 2015-08-15 | Disposition: A | Discharge status: Home / Self Care | Payer: Medicare Other | Attending: Radiation Oncology | Admitting: Radiation Oncology

## 2015-08-15 ENCOUNTER — Other Ambulatory Visit: Payer: Self-pay

## 2015-08-15 ENCOUNTER — Telehealth: Payer: Self-pay | Admitting: *Deleted

## 2015-08-15 ENCOUNTER — Observation Stay (HOSPITAL_COMMUNITY): Payer: Medicare Other

## 2015-08-15 ENCOUNTER — Encounter (HOSPITAL_COMMUNITY): Payer: Self-pay

## 2015-08-15 ENCOUNTER — Encounter: Payer: Self-pay | Admitting: Adult Health

## 2015-08-15 ENCOUNTER — Encounter: Payer: Self-pay | Admitting: Nutrition

## 2015-08-15 ENCOUNTER — Ambulatory Visit: Admission: RE | Admit: 2015-08-15 | Payer: Medicare Other | Source: Ambulatory Visit

## 2015-08-15 ENCOUNTER — Ambulatory Visit (HOSPITAL_BASED_OUTPATIENT_CLINIC_OR_DEPARTMENT_OTHER): Payer: Medicare Other | Admitting: Adult Health

## 2015-08-15 ENCOUNTER — Encounter: Payer: Self-pay | Admitting: *Deleted

## 2015-08-15 ENCOUNTER — Observation Stay (HOSPITAL_COMMUNITY)
Admission: AD | Admit: 2015-08-15 | Discharge: 2015-08-17 | Disposition: A | Discharge status: Home / Self Care | Payer: Medicare Other | Source: Ambulatory Visit | Attending: Internal Medicine | Admitting: Internal Medicine

## 2015-08-15 ENCOUNTER — Ambulatory Visit (HOSPITAL_BASED_OUTPATIENT_CLINIC_OR_DEPARTMENT_OTHER): Payer: Medicare Other

## 2015-08-15 VITALS — BP 148/93 | HR 123 | Temp 98.1°F | Resp 20

## 2015-08-15 DIAGNOSIS — E860 Dehydration: Secondary | ICD-10-CM | POA: Insufficient documentation

## 2015-08-15 DIAGNOSIS — Z9981 Dependence on supplemental oxygen: Secondary | ICD-10-CM | POA: Insufficient documentation

## 2015-08-15 DIAGNOSIS — Z7901 Long term (current) use of anticoagulants: Secondary | ICD-10-CM | POA: Insufficient documentation

## 2015-08-15 DIAGNOSIS — H3530 Unspecified macular degeneration: Secondary | ICD-10-CM | POA: Insufficient documentation

## 2015-08-15 DIAGNOSIS — I4510 Unspecified right bundle-branch block: Secondary | ICD-10-CM | POA: Insufficient documentation

## 2015-08-15 DIAGNOSIS — Z6820 Body mass index (BMI) 20.0-20.9, adult: Secondary | ICD-10-CM | POA: Insufficient documentation

## 2015-08-15 DIAGNOSIS — I2510 Atherosclerotic heart disease of native coronary artery without angina pectoris: Secondary | ICD-10-CM | POA: Insufficient documentation

## 2015-08-15 DIAGNOSIS — Z96641 Presence of right artificial hip joint: Secondary | ICD-10-CM | POA: Insufficient documentation

## 2015-08-15 DIAGNOSIS — M810 Age-related osteoporosis without current pathological fracture: Secondary | ICD-10-CM | POA: Insufficient documentation

## 2015-08-15 DIAGNOSIS — R000 Tachycardia, unspecified: Secondary | ICD-10-CM | POA: Diagnosis not present

## 2015-08-15 DIAGNOSIS — Z951 Presence of aortocoronary bypass graft: Secondary | ICD-10-CM | POA: Insufficient documentation

## 2015-08-15 DIAGNOSIS — D649 Anemia, unspecified: Secondary | ICD-10-CM | POA: Insufficient documentation

## 2015-08-15 DIAGNOSIS — Z931 Gastrostomy status: Secondary | ICD-10-CM | POA: Insufficient documentation

## 2015-08-15 DIAGNOSIS — I480 Paroxysmal atrial fibrillation: Secondary | ICD-10-CM | POA: Diagnosis not present

## 2015-08-15 DIAGNOSIS — I110 Hypertensive heart disease with heart failure: Secondary | ICD-10-CM | POA: Diagnosis not present

## 2015-08-15 DIAGNOSIS — E785 Hyperlipidemia, unspecified: Secondary | ICD-10-CM | POA: Diagnosis not present

## 2015-08-15 DIAGNOSIS — I252 Old myocardial infarction: Secondary | ICD-10-CM | POA: Diagnosis not present

## 2015-08-15 DIAGNOSIS — I5032 Chronic diastolic (congestive) heart failure: Secondary | ICD-10-CM | POA: Insufficient documentation

## 2015-08-15 DIAGNOSIS — D72819 Decreased white blood cell count, unspecified: Secondary | ICD-10-CM | POA: Diagnosis not present

## 2015-08-15 DIAGNOSIS — Z87891 Personal history of nicotine dependence: Secondary | ICD-10-CM | POA: Insufficient documentation

## 2015-08-15 DIAGNOSIS — F419 Anxiety disorder, unspecified: Secondary | ICD-10-CM | POA: Diagnosis not present

## 2015-08-15 DIAGNOSIS — D72829 Elevated white blood cell count, unspecified: Secondary | ICD-10-CM | POA: Diagnosis not present

## 2015-08-15 DIAGNOSIS — R110 Nausea: Secondary | ICD-10-CM

## 2015-08-15 DIAGNOSIS — E039 Hypothyroidism, unspecified: Secondary | ICD-10-CM | POA: Diagnosis not present

## 2015-08-15 DIAGNOSIS — E43 Unspecified severe protein-calorie malnutrition: Secondary | ICD-10-CM | POA: Insufficient documentation

## 2015-08-15 DIAGNOSIS — R05 Cough: Secondary | ICD-10-CM | POA: Diagnosis not present

## 2015-08-15 DIAGNOSIS — C321 Malignant neoplasm of supraglottis: Secondary | ICD-10-CM

## 2015-08-15 DIAGNOSIS — R112 Nausea with vomiting, unspecified: Secondary | ICD-10-CM | POA: Insufficient documentation

## 2015-08-15 DIAGNOSIS — Z79899 Other long term (current) drug therapy: Secondary | ICD-10-CM | POA: Diagnosis not present

## 2015-08-15 DIAGNOSIS — K219 Gastro-esophageal reflux disease without esophagitis: Secondary | ICD-10-CM | POA: Diagnosis not present

## 2015-08-15 DIAGNOSIS — R059 Cough, unspecified: Secondary | ICD-10-CM

## 2015-08-15 DIAGNOSIS — J449 Chronic obstructive pulmonary disease, unspecified: Secondary | ICD-10-CM | POA: Diagnosis present

## 2015-08-15 DIAGNOSIS — J209 Acute bronchitis, unspecified: Secondary | ICD-10-CM | POA: Diagnosis not present

## 2015-08-15 DIAGNOSIS — R0602 Shortness of breath: Secondary | ICD-10-CM | POA: Diagnosis not present

## 2015-08-15 DIAGNOSIS — J440 Chronic obstructive pulmonary disease with acute lower respiratory infection: Secondary | ICD-10-CM | POA: Diagnosis not present

## 2015-08-15 DIAGNOSIS — E876 Hypokalemia: Secondary | ICD-10-CM | POA: Diagnosis not present

## 2015-08-15 DIAGNOSIS — Z4682 Encounter for fitting and adjustment of non-vascular catheter: Secondary | ICD-10-CM | POA: Diagnosis not present

## 2015-08-15 DIAGNOSIS — R0600 Dyspnea, unspecified: Secondary | ICD-10-CM | POA: Diagnosis present

## 2015-08-15 DIAGNOSIS — Z510 Encounter for antineoplastic radiation therapy: Secondary | ICD-10-CM | POA: Diagnosis not present

## 2015-08-15 DIAGNOSIS — R070 Pain in throat: Secondary | ICD-10-CM

## 2015-08-15 DIAGNOSIS — C320 Malignant neoplasm of glottis: Secondary | ICD-10-CM | POA: Diagnosis not present

## 2015-08-15 DIAGNOSIS — J418 Mixed simple and mucopurulent chronic bronchitis: Secondary | ICD-10-CM | POA: Diagnosis not present

## 2015-08-15 LAB — TROPONIN I: Troponin I: <0.03 ng/mL (ref <0.03)

## 2015-08-15 LAB — CBC WITH DIFFERENTIAL/PLATELET
BASO%: 0.1 % (ref 0.0–2.0)
Basophils Absolute: 0 10*3/uL (ref 0.0–0.1)
EOS ABS: 0 10*3/uL (ref 0.0–0.5)
EOS%: 0.2 % (ref 0.0–7.0)
HEMATOCRIT: 41.3 % (ref 34.8–46.6)
HEMOGLOBIN: 13 g/dL (ref 11.6–15.9)
LYMPH#: 0.5 10*3/uL — AB (ref 0.9–3.3)
LYMPH%: 4.6 % — ABNORMAL LOW (ref 14.0–49.7)
MCH: 27.4 pg (ref 25.1–34.0)
MCHC: 31.5 g/dL (ref 31.5–36.0)
MCV: 86.9 fL (ref 79.5–101.0)
MONO#: 0.6 10*3/uL (ref 0.1–0.9)
MONO%: 6 % (ref 0.0–14.0)
NEUT%: 89.1 % — ABNORMAL HIGH (ref 38.4–76.8)
NEUTROS ABS: 9.4 10*3/uL — AB (ref 1.5–6.5)
PLATELETS: 348 10*3/uL (ref 145–400)
RBC: 4.75 10*6/uL (ref 3.70–5.45)
RDW: 17.7 % — AB (ref 11.2–14.5)
WBC: 10.5 10*3/uL — AB (ref 3.9–10.3)

## 2015-08-15 LAB — COMPREHENSIVE METABOLIC PANEL
ALT: <9 U/L (ref 0–55)
ANION GAP: 12 meq/L — AB (ref 3–11)
AST: 23 U/L (ref 5–34)
Albumin: 3.3 g/dL — ABNORMAL LOW (ref 3.5–5.0)
Alkaline Phosphatase: 142 U/L (ref 40–150)
BUN: 10.2 mg/dL (ref 7.0–26.0)
CALCIUM: 10.2 mg/dL (ref 8.4–10.4)
CHLORIDE: 99 meq/L (ref 98–109)
CO2: 28 meq/L (ref 22–29)
Creatinine: 0.8 mg/dL (ref 0.6–1.1)
EGFR: 66 mL/min/{1.73_m2} — ABNORMAL LOW (ref >90)
Glucose: 125 mg/dl (ref 70–140)
POTASSIUM: 4.6 meq/L (ref 3.5–5.1)
Sodium: 139 mEq/L (ref 136–145)
Total Bilirubin: 0.85 mg/dL (ref 0.20–1.20)
Total Protein: 7.9 g/dL (ref 6.4–8.3)

## 2015-08-15 LAB — GLUCOSE, CAPILLARY
GLUCOSE-CAPILLARY: 120 mg/dL — AB (ref 65–99)
Glucose-Capillary: 130 mg/dL — ABNORMAL HIGH (ref 65–99)

## 2015-08-15 MED ORDER — ONDANSETRON HCL 4 MG PO TABS: 4.0000 mg | ORAL_TABLET | Freq: Three times a day (TID) | ORAL | Status: DC | PRN

## 2015-08-15 MED ORDER — GUAIFENESIN ER 600 MG PO TB12
600.0000 mg | ORAL_TABLET | Freq: Two times a day (BID) | ORAL | Status: DC
  Administered 2015-08-15: 600 mg via ORAL
  Filled 2015-08-15 (×4): qty 1

## 2015-08-15 MED ORDER — METOPROLOL SUCCINATE ER 25 MG PO TB24
25.0000 mg | ORAL_TABLET | Freq: Every day | ORAL | Status: DC
  Administered 2015-08-16 – 2015-08-17 (×2): 25 mg via ORAL
  Filled 2015-08-15 (×2): qty 1

## 2015-08-15 MED ORDER — HYDROCODONE-ACETAMINOPHEN 5-325 MG PO TABS
1.0000 | ORAL_TABLET | Freq: Once | ORAL | Status: AC
  Administered 2015-08-15: 1 via ORAL
  Filled 2015-08-15: qty 1

## 2015-08-15 MED ORDER — LEVOTHYROXINE SODIUM 25 MCG PO TABS
25.0000 ug | ORAL_TABLET | Freq: Every day | ORAL | Status: DC
  Administered 2015-08-15 – 2015-08-17 (×3): 25 ug via ORAL
  Filled 2015-08-15 (×3): qty 1

## 2015-08-15 MED ORDER — PANTOPRAZOLE SODIUM 40 MG PO TBEC
40.0000 mg | DELAYED_RELEASE_TABLET | Freq: Every day | ORAL | Status: DC
  Administered 2015-08-15: 40 mg via ORAL
  Filled 2015-08-15: qty 1

## 2015-08-15 MED ORDER — NITROGLYCERIN 0.4 MG SL SUBL: 0.4000 mg | SUBLINGUAL_TABLET | SUBLINGUAL | Status: DC | PRN

## 2015-08-15 MED ORDER — OSMOLITE 1.5 CAL PO LIQD
237.0000 mL | Freq: Four times a day (QID) | ORAL | Status: DC
  Administered 2015-08-15 – 2015-08-17 (×7): 237 mL
  Filled 2015-08-15 (×10): qty 237

## 2015-08-15 MED ORDER — HYDROCODONE-ACETAMINOPHEN 7.5-325 MG/15ML PO SOLN
10.0000 mL | Freq: Four times a day (QID) | ORAL | Status: DC | PRN
  Administered 2015-08-15 – 2015-08-17 (×7): 10 mL
  Filled 2015-08-15 (×7): qty 15

## 2015-08-15 MED ORDER — HYDROCODONE-ACETAMINOPHEN 7.5-325 MG/15ML PO SOLN: 10.0000 mL | Freq: Four times a day (QID) | ORAL | Status: DC | PRN

## 2015-08-15 MED ORDER — SENNA 8.6 MG PO TABS
1.0000 | ORAL_TABLET | Freq: Every day | ORAL | Status: DC
Start: 2015-08-15 — End: 2015-08-17
  Administered 2015-08-15 – 2015-08-17 (×3): 8.6 mg via ORAL
  Filled 2015-08-15 (×3): qty 1

## 2015-08-15 MED ORDER — ONDANSETRON HCL 4 MG PO TABS
4.0000 mg | ORAL_TABLET | Freq: Four times a day (QID) | ORAL | Status: DC | PRN
Start: 2015-08-15 — End: 2015-08-17

## 2015-08-15 MED ORDER — ALPRAZOLAM 0.25 MG PO TABS
0.1250 mg | ORAL_TABLET | Freq: Two times a day (BID) | ORAL | Status: DC | PRN
  Administered 2015-08-15 – 2015-08-17 (×3): 0.125 mg via ORAL
  Filled 2015-08-15 (×3): qty 1

## 2015-08-15 MED ORDER — SODIUM CHLORIDE 0.9 % IV SOLN
Freq: Once | INTRAVENOUS | Status: AC
  Administered 2015-08-15: 10:00:00 via INTRAVENOUS

## 2015-08-15 MED ORDER — SODIUM CHLORIDE 0.9 % IV SOLN
Freq: Once | INTRAVENOUS | Status: AC
  Administered 2015-08-15: 10:00:00 via INTRAVENOUS
  Filled 2015-08-15: qty 2

## 2015-08-15 MED ORDER — IPRATROPIUM-ALBUTEROL 0.5-2.5 (3) MG/3ML IN SOLN
3.0000 mL | Freq: Four times a day (QID) | RESPIRATORY_TRACT | Status: DC
  Administered 2015-08-15 – 2015-08-17 (×9): 3 mL via RESPIRATORY_TRACT
  Filled 2015-08-15 (×9): qty 3

## 2015-08-15 MED ORDER — JEVITY 1.2 CAL PO LIQD: 1000.0000 mL | ORAL | Status: DC

## 2015-08-15 MED ORDER — PANTOPRAZOLE SODIUM 40 MG PO PACK
40.0000 mg | PACK | Freq: Every day | ORAL | Status: DC
  Administered 2015-08-16 – 2015-08-17 (×2): 40 mg
  Filled 2015-08-15 (×4): qty 20

## 2015-08-15 MED ORDER — ACETAMINOPHEN 325 MG PO TABS
650.0000 mg | ORAL_TABLET | Freq: Four times a day (QID) | ORAL | Status: DC | PRN
  Administered 2015-08-15: 650 mg via ORAL
  Filled 2015-08-15: qty 2

## 2015-08-15 MED ORDER — ACETAMINOPHEN 500 MG PO TABS: 500.0000 mg | ORAL_TABLET | Freq: Four times a day (QID) | ORAL | Status: DC | PRN

## 2015-08-15 MED ORDER — POLYVINYL ALCOHOL 1.4 % OP SOLN
1.0000 [drp] | Freq: Two times a day (BID) | OPHTHALMIC | Status: DC
  Administered 2015-08-15 – 2015-08-17 (×5): 1 [drp] via OPHTHALMIC
  Filled 2015-08-15: qty 15

## 2015-08-15 MED ORDER — APIXABAN 5 MG PO TABS
5.0000 mg | ORAL_TABLET | Freq: Two times a day (BID) | ORAL | Status: DC
  Administered 2015-08-15 – 2015-08-17 (×4): 5 mg via ORAL
  Filled 2015-08-15 (×4): qty 1

## 2015-08-15 MED ORDER — ALBUTEROL SULFATE (2.5 MG/3ML) 0.083% IN NEBU: 2.5000 mg | INHALATION_SOLUTION | RESPIRATORY_TRACT | Status: DC | PRN

## 2015-08-15 MED ORDER — ACETAMINOPHEN 650 MG RE SUPP: 650.0000 mg | Freq: Four times a day (QID) | RECTAL | Status: DC | PRN

## 2015-08-15 MED ORDER — POLYETHYL GLYCOL-PROPYL GLYCOL 0.4-0.3 % OP GEL: Freq: Two times a day (BID) | OPHTHALMIC | Status: DC

## 2015-08-15 MED ORDER — PROCHLORPERAZINE EDISYLATE 5 MG/ML IJ SOLN
5.0000 mg | Freq: Once | INTRAMUSCULAR | Status: AC
  Administered 2015-08-15: 5 mg via INTRAVENOUS
  Filled 2015-08-15: qty 2

## 2015-08-15 MED ORDER — ONDANSETRON HCL 4 MG/2ML IJ SOLN
4.0000 mg | Freq: Four times a day (QID) | INTRAMUSCULAR | Status: DC | PRN
  Administered 2015-08-15 – 2015-08-16 (×4): 4 mg via INTRAVENOUS
  Filled 2015-08-15 (×4): qty 2

## 2015-08-15 MED ORDER — SODIUM CHLORIDE 0.9 % IV SOLN
INTRAVENOUS | Status: DC
  Administered 2015-08-15 – 2015-08-17 (×4): via INTRAVENOUS

## 2015-08-15 NOTE — Progress Notes (Signed)
Initial Nutrition Assessment  DOCUMENTATION CODES:   Severe malnutrition in context of chronic illness  INTERVENTION:   Monitor magnesium, potassium, and phosphorus daily for at least 3 days, MD to replete as needed, as pt is at risk for refeeding syndrome given severe malnutrition status and poor PO intake prior to PEG placement.  Initiate Osmolite 1.5, 1 can every 4 hours. For 7/10: Begin bolus feeds at 1600 and 2000.  Starting 7/11: Provide 1 can every 4 hours (0800, 1200, 1600, 2000) Recommend free water flushes of 60 ml before and after each bolus. Also provide 240 ml additional free water BID via PEG or PO. This tube feeding regimen provides 1420 kcal (81% of needs), 60 g protein (80% of needs) and 1684 ml H2O.  Goal rate: 5 cans of Osmolite 1.5 daily. Tube feeding regimen + free water flushes will provide 1775 kcal, 75 g protein and 1865 ml H2O.  Will advance as tolerated to goal.  RD to continue to monitor  NUTRITION DIAGNOSIS:   Malnutrition related to chronic illness as evidenced by percent weight loss, severe depletion of body fat, severe depletion of muscle mass.  GOAL:   Patient will meet greater than or equal to 90% of their needs  MONITOR:   PO intake, Labs, Weight trends, TF tolerance, I & O's  REASON FOR ASSESSMENT:   Consult Enteral/tube feeding initiation and management  ASSESSMENT:   81 y.o. 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 Patient was complaining of nausea, poor oral intake, weakness, dry cough and mild dyspnea.   Patient in room with no family at bedside. Pt has been followed by Sharon RD, last seen 7/6. Pt had PEG placed 7/3 and has been advancing tube feeding slowly via bolus feedings. Pt received Osmolite 1.5 and had started administering 4 cans daily (1 can every 3 hours). Her  goal rate is 5 cans daily which meets her estimated needs. Pt states that she continues to have nausea but would like to see if she can tolerate a bolus feed today. Will initiate 1 can every 4 hours. Hope pt can tolerate 4 cans tomorrow 7/11 then can advance to goal rate of 5 cans daily. If unable to tolerate bolus feeds today, may need initiate continuous feeds via pump and can transition back to bolus feeds prior to discharge. RD will monitor for tolerance and plan.  Per weight history, pt has lost 5 lb since 6/26 (4% wt loss x 2 weeks, significant for time frame). Nutrition-Focused physical exam completed. Findings are severe fat depletion, severe muscle depletion, and no edema.   Would recommend monitor for refeeding syndrome given severe malnutrition status and poor intake prior to PEG placement.  Medications reviewed. Labs reviewed: Mg/Phos WNL  Diet Order:  Diet clear liquid Room service appropriate?: Yes; Fluid consistency:: Thin  Skin:  Reviewed, no issues  Last BM:  PTA  Height:   Ht Readings from Last 1 Encounters:  08/15/15 5\' 6"  (1.676 m)    Weight:   Wt Readings from Last 1 Encounters:  08/15/15 127 lb (57.607 kg)    Ideal Body Weight:  59.1 kg  BMI:  Body mass index is 20.51 kg/(m^2).  Estimated Nutritional Needs:   Kcal:  1750-2000  Protein:  75-90g  Fluid:  2L/day  EDUCATION NEEDS:   Education needs addressed  Clayton Bibles, MS, RD, LDN Pager: [phone] After Hours  Pager: [phone]  

## 2015-08-15 NOTE — Progress Notes (Addendum)
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 Direct visualization & possible biopsy recommended. No adenopathy.    05/20/2015 Imaging CT neck: Nodularity of (L) epiglottis could represent a mass of polyp. No adenopathy.    06/03/2015 Initial Biopsy Direct laryngoscopy [name]). Epiglottis biopsy: Squamous cell carcinoma.  (R) lateral wall larynx biopsy: Squamous cell carcinoma, moderately to poorly differentiated. p16 strongly (+) on both specimens.    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 3 sub-cm hypermetabolic (R) cervical LNs consistent with nodal  mets. 2 level 2 and 1 level 3 LN. No distant mets.    07/19/2015 -  Radiation Therapy Currently undergoing IMRT [name]).      INTERVAL HISTORY:  Ms. Snead presents for evaluation today after "feeling sick all weekend" and made a call in to [name], [place] Nurse Navigator.  Ms. Burgardt tells me that she woke up on Saturday "and had completely lost my voice."  She began having a hacking, dry cough on Sunday.  Her husband has recently been sick and thinks he may have a cold (he has been coughing as well).  She denies any fevers.  She has been nauseated and "dry heaving", but has not vomited at all.  No diarrhea.  Her last tube feeding was last evening where she instilled 1 can of feeding with water flushes.     ADDITIONAL REVIEW OF SYSTEMS:  Review of Systems  Constitutional: Positive for malaise/fatigue.       (+) fatigue; "I feel so sick"   HENT: Positive for sore throat.   Respiratory: Positive for cough.        Dry cough since Sun. 08/14/15  Gastrointestinal: Positive for nausea. Negative for diarrhea and constipation.  Genitourinary: Negative for dysuria.  Neurological: Positive for  speech change and weakness.       (+) laryngitis      CURRENT MEDICATIONS:  Current Outpatient Prescriptions on File Prior to Visit  Medication Sig Dispense Refill  . acetaminophen (TYLENOL) 500 MG tablet Take 500 mg by mouth every 6 (six) hours as needed.    . ALPRAZolam (XANAX) 0.25 MG tablet Take 0.25 mg by mouth 2 (two) times daily as needed for anxiety or sleep.    [name] apixaban (ELIQUIS) 5 MG TABS tablet Take 1 tablet (5 mg total) by mouth 2 (two) times daily. 60 tablet 1  . cholecalciferol (VITAMIN D) 1000 UNITS tablet Take 1,000 Units by mouth daily.    [name] esomeprazole (NEXIUM) 40 MG capsule Take 40 mg by mouth daily before breakfast.      . HYDROcodone-acetaminophen (HYCET) 7.5-325 mg/15 ml solution Take 10-93mL every 4 hours as needed for pain. Take with food. 500 mL 0  . ipratropium-albuterol (DUONEB) 0.5-2.5 (3) MG/3ML SOLN Take 3 mLs by nebulization 4 (four) times daily. 360 mL 11  . levothyroxine (SYNTHROID, LEVOTHROID) 25 MCG tablet Take 25 mcg by mouth daily before breakfast.     . lidocaine (XYLOCAINE) 2 % solution Mix 1 part 2%viscous lidocaine,1part H2O.Swish and/or swallow 95mL of this mixture,7min before meals and at bedtime, up to QID 100 mL 5  . metoprolol succinate (TOPROL-XL) 25 MG 24 hr tablet Take 1 tablet (25 mg total) by mouth daily.    . nitroGLYCERIN (NITROSTAT) 0.4 MG SL tablet Place 0.4  mg under the tongue every 5 (five) minutes as needed for chest pain.    . Nutritional Supplements (FEEDING SUPPLEMENT, OSMOLITE 1.5 CAL,) LIQD Give 1 can of Osmolite 1.5 or equivalent - 5 times daily with 60 cc free water before and after bolus feeding via PEG.  Give additional 240 cc water BID. Send formula and supplies. 1185 mL 0  . ondansetron (ZOFRAN) 4 MG tablet Take 1 tablet (4 mg total) by mouth every 8 (eight) hours as needed for nausea or vomiting. 50 tablet 3  . OXYGEN Inhale 2 L into the lungs as needed (for shortness of breath).     [name] Glycol-Propyl Glycol  (SYSTANE OP) Place 1 drop into both eyes 2 (two) times daily.     . Potassium Chloride 40 MEQ/15ML (20%) SOLN Place 15 mLs into feeding tube 2 (two) times daily. Take 15 mLs in the morning and 15 mLs in the evening for a total of 80 mEq of potassium per day. 1 Bottle 1  . senna (SENOKOT) 8.6 MG tablet Take 1 tablet by mouth daily.    . sodium fluoride (FLUORISHIELD) 1.1 % GEL dental gel Instill one drop of gel per tooth space of fluoride tray. Place over teeth for 5 minutes. Remove. Spit out excess. Repeat nightly. 120 mL prn  . sucralfate (CARAFATE) 1 g tablet Dissolve 1 tablet in 7mL H2O and swallow up to QID,PRN sore throat. 60 tablet 5  . traMADol (ULTRAM) 50 MG tablet Take 1 tablet (50 mg total) by mouth every 6 (six) hours as needed. Pain (Patient not taking: Reported on 08/01/2015) 30 tablet 0  . [DISCONTINUED] budesonide-formoterol (SYMBICORT) 160-4.5 MCG/ACT inhaler Inhale 2 puffs into the lungs 2 (two) times daily. 1 Inhaler 12  . [DISCONTINUED] calcium citrate-vitamin D (CITRACAL+D) 315-200 MG-UNIT per tablet Take 1 tablet by mouth 2 (two) times daily.       No current facility-administered medications on file prior to visit.    ALLERGIES:  Allergies  Allergen Reactions  . Levofloxacin Nausea Only    Unable to tolerate more than 7 days  . Penicillins Swelling and Rash    Swelling location undefined. Has patient had a PCN reaction causing immediate rash, facial/tongue/throat swelling, SOB or lightheadedness with hypotension: Yes Has patient had a PCN reaction causing severe rash involving mucus membranes or skin necrosis: No Has patient had a PCN reaction that required hospitalization Unk Has patient had a PCN reaction occurring within the last 10 years: Unk If all of the above answers are "NO", then may proceed with Cephalosporin use.   [name] Spiriva [Tiotropium Bromide Monohydrate] Other (See Comments)    Throat irritation  . Statins Other (See Comments)    Myalgias  .  Budesonide-Formoterol Fumarate Other (See Comments)    Tachycardia  . Morphine Other (See Comments)    double vision  . Phenazopyridine Hcl Other (See Comments)    *piridium* face red  . Pneumococcal Vaccines Swelling    [place] of the skin  . Prednisone Swelling    Can't take PO but injectable is fine  . Sulfa Antibiotics Rash  . Sulfonamide Derivatives Rash     PHYSICAL EXAM:  Filed Vitals:   08/15/15 0915  BP: 148/93  Pulse: 123  Temp: 98.1 F (36.7 C)  Resp: 20  O2 sat:               98% on room air   Weight Date  127 lb 12.8 oz (57.97 kg) 08/10/15  129  lb 4.8 oz (58.65 kg) 08/05/15  132 lb 1.6 oz (59.92 kg) 08/01/15  128 lb 1.6 oz (58.106 kg) 07/25/15  129 lb 1.6 oz (58.559 kg) Pre-treatment: 06/22/15   General: Female in mild distress given cough and nausea. Accompanied by her husband today.  HEENT: Head normocephalic.  Pupils equal and reactive to light. Conjunctivae clear without exudate.  Sclerae anicteric. Neck: Skin on neck is mildly red and hyperpigmented. No desquamation.   Cardiovascular: Tachycardic, but regular rhythm.  Respiratory: Bilat upper lobes with rhonchi and wheezes.  Bases diminished.  Breathing non-labored. GI: Abdomen soft and round. Bowel sounds normoactive. G-tube in place.  GU: Deferred.   Neuro/MSK: No focal deficits. Weakness; unable to stand for orthostatic vitals.  Psych: Normal mood. Extremities: No edema. Skin: Warm and dry.    LABORATORY DATA:  CMP Latest Ref Rng 08/15/2015 08/10/2015 08/08/2015  Glucose 70 - 140 mg/dl 125 93 87  BUN 7.0 - 26.0 mg/dL 10.2 8.9 -  Creatinine 0.6 - 1.1 mg/dL 0.8 0.9 -  Sodium 136 - 145 mEq/L 139 137 140  Potassium 3.5 - 5.1 mEq/L 4.6 3.1(L) 3.1(L)  Chloride 101 - 111 mmol/L - - -  CO2 22 - 29 mEq/L 28 29 -  Calcium 8.4 - 10.4 mg/dL 10.2 9.2 -  Total Protein 6.4 - 8.3 g/dL 7.9 7.2 -  Total Bilirubin 0.20 - 1.20 mg/dL 0.85 0.70 -  Alkaline Phos 40 - 150 U/L 142 143 -  AST 5 - 34 U/L 23 17 -  ALT 0 - 55  U/L <9 <9 -    CBC    Component Value Date/Time   WBC 10.5* 08/15/2015 0941   WBC 4.1 08/08/2015 0907   WBC 9.8 03/17/2011 1449   RBC 4.75 08/15/2015 0941   RBC 4.50 08/08/2015 0907   RBC 4.85 03/17/2011 1449   HGB 13.0 08/15/2015 0941   HGB 11.6* 08/08/2015 1329   HGB 13.4 03/17/2011 1449   HCT 41.3 08/15/2015 0941   HCT 34.0* 08/08/2015 1329   HCT 43.3 03/17/2011 1449   PLT 348 08/15/2015 0941   PLT 229 08/08/2015 0907   MCV 86.9 08/15/2015 0941   MCV 90.2 08/08/2015 0907   MCV 89.3 03/17/2011 1449   MCH 27.4 08/15/2015 0941   MCH 26.9 08/08/2015 0907   MCH 27.6 03/17/2011 1449   MCHC 31.5 08/15/2015 0941   MCHC 29.8* 08/08/2015 0907   MCHC 30.9* 03/17/2011 1449   RDW 17.7* 08/15/2015 0941   RDW 17.0* 08/08/2015 0907   LYMPHSABS 0.5* 08/15/2015 0941   LYMPHSABS 3.5 12/22/2014 1151   MONOABS 0.6 08/15/2015 0941   MONOABS 0.9 12/22/2014 1151   EOSABS 0.0 08/15/2015 0941   EOSABS 0.3 12/22/2014 1151   BASOSABS 0.0 08/15/2015 0941   BASOSABS 0.1 12/22/2014 1151      DIAGNOSTIC IMAGING:  None at this visit.    ASSESSMENT & PLAN:  Ms. Dilling is a pleasant 81 y.o. 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  Completed fraction #17/35 on Friday, 08/12/15. Presents to clinic today for acute concerns.   1. Cancer of the epiglottis:  Ms. Leis should continue her planned radiation treatments, as tolerated, per Dr. Lisbeth Renshaw (on-call radiation oncologist in the absence of Dr. [name], the patient's attending radiation oncologist).  [facility] radiation therapists made aware of patient's admission to the hospital and the potential need for adjustments in her treatment schedule.  They will coordinate with inpatient nurses.  2. Cough, malaise, mild leukocytosis: It appears Ms. Rummler may have acquired an upper respiratory viral illness given her current complaints.  Her WBC's were mildly elevated today at 10.5 (up from  4.1 on 08/08/15).  She is afebrile. She does have a history of COPD/emphysema, but tells me that she has not had a COPD flair in many years.  She does not routinely use any steroids inhalers or nebulizer treatments.  I explained that she may need chest x-ray or other imaging as deemed necessary by the hospitalist team.  She is not in acute respiratory distress in clinic today.  She will be admitted to [facility] for further evaluation.   3. Nausea & dehydration secondary to viral illness & protein-calorie malnutrition:  Ms. Sampson was having some nausea last week, likely secondary to new tube feeding initiation.  Her dehydration and nausea now are likely multifactorial considering current possible viral illness.  A peripheral IV was started in clinic today (#22 in left [facility]) and she began infusion of Normal Saline at 250 mL/hr.  I purposefully am gently hydrating her given her history of diastolic heart failure.  She is tachycardic in clinic today with HR in 120s; rhythm regular and not suggestive of A-fib with RVR. Her BP is stable and she is hemodynamically stable in clinic.  She was also given Zofran 4 mg IV once in clinic as well.    4. Sore throat: This is likely secondary to her radiation therapy and recent persistent cough.  She was given 1 tab Hydrocodone/APAP 5/325 in clinic today via G-tube for pain.  I am hopeful this may provide a bit of relief for her cough as well.    *Patient transported in stable condition from clinic area to [facility] Admitting by [name], RN-H&N Navigator.   Dispo:   -Admission to [facility] telemetry bed, under the care of hospitalist service/Dr. Tyrell Antonio, with weakness, dehydration, and nausea.  Patient and husband agreed with this plan.  -Continue planned radiation treatments.   *This patient was seen by Dr. Lisbeth Renshaw, on-call radiation oncologist, who agreed with the above plan of care.   A total of 35 minutes was spent in face-to-face  care of this patient with greater than 50% of that time spent in counseling & care-coordination.   Mike Craze, NP [place] [phone]

## 2015-08-15 NOTE — Progress Notes (Signed)
sent prior auth req for ondansetron via covermymeds

## 2015-08-15 NOTE — H&P (Signed)
History and Physical    Renee [name] [id number] DOB: 06/06/1934 DOA: 08/15/2015  PCP: [name], MD  Patient coming from: Home   Chief Complaint: feeling sick, cough, nausea.   HPI: Renee [name] is a 81 y.o. 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 Patient was complaining of nausea, poor oral intake, weakness, dry cough and mild dyspnea. She was found to be tachycardic. We were ask for direct admission. Patient report symptoms started over the weekend. She denies fever or chills. She report that her husband has been coughing and sick too.  She denies chest pain, abdominal pain. She report nausea.   ED Course: Direct admission from cancer center.   Review of Systems: As per HPI otherwise 10 point review of systems negative.    Past Medical History  Diagnosis Date  . IBS (irritable bowel syndrome)   . Coronary artery disease     a. CABG 09/2009. b. normal myoview in 05/2012.   [name] PAF (paroxysmal atrial fibrillation) ([place])     a. Post op after CABG. Intolerant of amio in the past. b. Recurrence - started on Tikosyn 11/2013.  [name] Dysphagia   . Hypertension   . HLD (hyperlipidemia)     can't take meds d/t joint aches/pains  . Chronic bronchitis ([place])   . Emphysema/COPD ([place])     on home O2  . Sciatic nerve pain   . Orthostatic hypotension   . Chronic respiratory failure ([place])   . Brain aneurysm   . Internal hemorrhoids   . PONV (postoperative nausea and vomiting)   . RBBB     takes Metoporolol daily  . Chronic diastolic CHF (congestive heart failure) (HCC)     was on Lasix but has been off for a few months  . Myocardial infarction [facility]) 2009    takes Eliquis daily.  . Emphysema lung ([place])   . Pneumonia     several yrs ago  . History of vertigo     doesn't take any meds  . Arthritis   . Osteoporosis   .  Chronic back pain     buldging disc  . History of colon polyps     benign  . Hypothyroidism     takes Synthroid daily  . Macular degeneration     dry  . GERD (gastroesophageal reflux disease)     takes Nexium daily  . Anxiety     takes Xanax as needed  . History of shingles     Past Surgical History  Procedure Laterality Date  . Total hip arthroplasty Right 11/01/2008  . Appendectomy  1995  . Tonsillectomy and adenoidectomy    . Joint replacement    . Coronary artery bypass graft  09/21/2009    CABG X2  . Cardiac catheterization  1990's; 06/2009  . Cholecystectomy  2008  . Larynx surgery  ~ 1960    "tumor removed"  . Total abdominal hysterectomy  1986  . Tubal ligation  1973  . Cataract extraction w/ intraocular lens  implant, bilateral Bilateral   . Retinal laser procedure Left 10/19/2013    "had a wrinkle in it"  . Tee without cardioversion N/A 05/10/2014    Procedure: TRANSESOPHAGEAL ECHOCARDIOGRAM (TEE);  Surgeon: Sueanne Margarita, MD;  Location: [facility] ENDOSCOPY;  Service: Cardiovascular;  Laterality: N/A;  . Atrial fibrillation ablation N/A 05/11/2014    Procedure: ATRIAL FIBRILLATION ABLATION;  Surgeon: Thompson Grayer, MD;  Location: [facility] CATH LAB;  Service: Cardiovascular;  Laterality: N/A;  . Colonosocpy    . Esophagogastroduodenoscopy    . Direct laryngoscopy N/A 06/03/2015    Procedure: Direct laryngoscopy with biopsy left epiglottic lesion;  Surgeon: [name], MD;  Location: [place];  Service: ENT;  Laterality: N/A;     reports that she has quit smoking. Her smoking use included Cigarettes. She has never used smokeless tobacco. She reports that she does not drink alcohol or use illicit drugs.  Allergies  Allergen Reactions  . Levofloxacin Nausea Only    Unable to tolerate more than 7 days  . Penicillins Swelling and Rash    Swelling location undefined. Has patient had a PCN reaction causing immediate rash, facial/tongue/throat swelling, SOB or lightheadedness with  hypotension: Yes Has patient had a PCN reaction causing severe rash involving mucus membranes or skin necrosis: No Has patient had a PCN reaction that required hospitalization Unk Has patient had a PCN reaction occurring within the last 10 years: Unk If all of the above answers are "NO", then may proceed with Cephalosporin use.   [name] Spiriva [Tiotropium Bromide Monohydrate] Other (See Comments)    Throat irritation  . Statins Other (See Comments)    Myalgias  . Budesonide-Formoterol Fumarate Other (See Comments)    Tachycardia  . Morphine Other (See Comments)    double vision  . Phenazopyridine Hcl Other (See Comments)    *piridium* face red  . Pneumococcal Vaccines Swelling    [place] of the skin  . Prednisone Swelling    Can't take PO but injectable is fine  . Sulfa Antibiotics Rash  . Sulfonamide Derivatives Rash    Family History  Problem Relation Age of Onset  . Heart disease Mother   . Transient ischemic attack Mother   . Hypertension Mother   . Deep vein thrombosis Mother   . Heart attack Father   . Hypertension Father   . Diabetes Father   . Hyperlipidemia Father   . Breast cancer Sister   . Myasthenia gravis Brother   . Brain cancer Sister     mets from breast  . Colon polyps Brother   . Hypertension Brother   . Colon cancer Daughter   . Colon cancer Other     nephew     Prior to Admission medications   Medication Sig Start Date End Date Taking? Authorizing Provider  acetaminophen (TYLENOL) 500 MG tablet Take 500 mg by mouth every 6 (six) hours as needed.    Historical Provider, MD  ALPRAZolam Duanne Moron) 0.25 MG tablet Take 0.25 mg by mouth 2 (two) times daily as needed for anxiety or sleep.    Historical Provider, MD  apixaban (ELIQUIS) 5 MG TABS tablet Take 1 tablet (5 mg total) by mouth 2 (two) times daily. 08/17/14   Thompson Grayer, MD  cholecalciferol (VITAMIN D) 1000 UNITS tablet Take 1,000 Units by mouth daily.    Historical Provider, MD  esomeprazole  (NEXIUM) 40 MG capsule Take 40 mg by mouth daily before breakfast.      Historical Provider, MD  HYDROcodone-acetaminophen (HYCET) 7.5-325 mg/15 ml solution Take 10-54mL every 4 hours as needed for pain. Take with food. 07/25/15   Eppie Gibson, MD  ipratropium-albuterol (DUONEB) 0.5-2.5 (3) MG/3ML SOLN Take 3 mLs by nebulization 4 (four) times daily. 06/15/15   Brand Males, MD  levothyroxine (SYNTHROID, LEVOTHROID) 25 MCG tablet Take 25 mcg by mouth daily before breakfast.  03/02/13   Historical  Provider, MD  lidocaine (XYLOCAINE) 2 % solution Mix 1 part 2%viscous lidocaine,1part H2O.Swish and/or swallow 42mL of this mixture,9min before meals and at bedtime, up to QID 06/22/15   Eppie Gibson, MD  metoprolol succinate (TOPROL-XL) 25 MG 24 hr tablet Take 1 tablet (25 mg total) by mouth daily. 03/28/15   Deboraha Sprang, MD  nitroGLYCERIN (NITROSTAT) 0.4 MG SL tablet Place 0.4 mg under the tongue every 5 (five) minutes as needed for chest pain.    Historical Provider, MD  Nutritional Supplements (FEEDING SUPPLEMENT, OSMOLITE 1.5 CAL,) LIQD Give 1 can of Osmolite 1.5 or equivalent - 5 times daily with 60 cc free water before and after bolus feeding via PEG.  Give additional 240 cc water BID. Send formula and supplies. 08/08/15   Eppie Gibson, MD  ondansetron (ZOFRAN) 4 MG tablet Take 1 tablet (4 mg total) by mouth every 8 (eight) hours as needed for nausea or vomiting. 08/10/15   Holley Bouche, NP  OXYGEN Inhale 2 L into the lungs as needed (for shortness of breath).     Historical Provider, MD  Polyethyl Glycol-Propyl Glycol (SYSTANE OP) Place 1 drop into both eyes 2 (two) times daily.     Historical Provider, MD  Potassium Chloride 40 MEQ/15ML (20%) SOLN Place 15 mLs into feeding tube 2 (two) times daily. Take 15 mLs in the morning and 15 mLs in the evening for a total of 80 mEq of potassium per day. 08/11/15   Holley Bouche, NP  senna (SENOKOT) 8.6 MG tablet Take 1 tablet by mouth daily.    Historical  Provider, MD  sodium fluoride (FLUORISHIELD) 1.1 % GEL dental gel Instill one drop of gel per tooth space of fluoride tray. Place over teeth for 5 minutes. Remove. Spit out excess. Repeat nightly. 06/27/15   Lenn Cal, DDS  sucralfate (CARAFATE) 1 g tablet Dissolve 1 tablet in 87mL H2O and swallow up to QID,PRN sore throat. 06/22/15   Eppie Gibson, MD  traMADol (ULTRAM) 50 MG tablet Take 1 tablet (50 mg total) by mouth every 6 (six) hours as needed. Pain Patient not taking: Reported on 08/01/2015 03/17/11   Posey Boyer, MD    Physical Exam: Filed Vitals:   08/15/15 1045 08/15/15 1135  BP: 157/73   Pulse: 104   Temp: 98.1 F (36.7 C)   TempSrc: Oral   Resp: 20   Height: 5\' 6"  (1.676 m)   Weight: 57.607 kg (127 lb)   SpO2: 93% 99%      Constitutional: NAD, calm, comfortable, cachetic.  Filed Vitals:   08/15/15 1045 08/15/15 1135  BP: 157/73   Pulse: 104   Temp: 98.1 F (36.7 C)   TempSrc: Oral   Resp: 20   Height: 5\' 6"  (1.676 m)   Weight: 57.607 kg (127 lb)   SpO2: 93% 99%   Eyes: PERRL, lids and conjunctivae normal ENMT: Mucous membranes are moist. Posterior pharynx clear of any exudate or lesions.Normal dentition.  Neck: normal, supple, no masses, no thyromegaly Respiratory: clear to auscultation bilaterally, no wheezing, no crackles. Normal respiratory effort. No accessory muscle use.  Cardiovascular: Regular rate and rhythm, no murmurs / rubs / gallops. No extremity edema. 2+ pedal pulses. No carotid bruits.  Abdomen: no tenderness, no masses palpated. No hepatosplenomegaly. Bowel sounds positive. Peg tube in place.  Musculoskeletal: no clubbing / cyanosis. No joint deformity upper and lower extremities. Good ROM, no contractures. Normal muscle tone.  Skin: neck with redness.  Neurologic: CN  2-12 grossly intact. Sensation intact, DTR normal. Strength 5/5 in all 4.  Psychiatric: Normal judgment and insight. Alert and oriented x 3. Normal mood.     Labs on  Admission: I have personally reviewed following labs and imaging studies  CBC:  Recent Labs Lab 08/08/15 1329 08/15/15 0941  WBC  --  10.5*  NEUTROABS  --  9.4*  HGB 11.6* 13.0  HCT 34.0* 41.3  MCV  --  86.9  PLT  --  0000000   Basic Metabolic Panel:  Recent Labs Lab 08/08/15 1329 08/10/15 1453 08/15/15 0941  NA 140 137 139  K 3.1* 3.1* 4.6  CO2  --  29 28  GLUCOSE 87 93 125  BUN  --  8.9 10.2  CREATININE  --  0.9 0.8  CALCIUM  --  9.2 10.2  MG  --  1.8  --   PHOS  --  2.7  --    GFR: Estimated Creatinine Clearance: 52.7 mL/min (by C-G formula based on Cr of 0.8). Liver Function Tests:  Recent Labs Lab 08/10/15 1453 08/15/15 0941  AST 17 23  ALT <9 <9  ALKPHOS 143 142  BILITOT 0.70 0.85  PROT 7.2 7.9  ALBUMIN 3.0* 3.3*   No results for input(s): LIPASE, AMYLASE in the last 168 hours. No results for input(s): AMMONIA in the last 168 hours. Coagulation Profile: No results for input(s): INR, PROTIME in the last 168 hours. Cardiac Enzymes:  Recent Labs Lab 08/15/15 1139  TROPONINI <0.03   BNP (last 3 results) No results for input(s): PROBNP in the last 8760 hours. HbA1C: No results for input(s): HGBA1C in the last 72 hours. CBG: No results for input(s): GLUCAP in the last 168 hours. Lipid Profile: No results for input(s): CHOL, HDL, LDLCALC, TRIG, CHOLHDL, LDLDIRECT in the last 72 hours. Thyroid Function Tests: No results for input(s): TSH, T4TOTAL, FREET4, T3FREE, THYROIDAB in the last 72 hours. Anemia Panel: No results for input(s): VITAMINB12, FOLATE, FERRITIN, TIBC, IRON, RETICCTPCT in the last 72 hours. Urine analysis:    Component Value Date/Time   COLORURINE YELLOW 05/12/2014 [place] 05/12/2014 0937   LABSPEC 1.034* 05/12/2014 0937   PHURINE 5.0 05/12/2014 0937   GLUCOSEU NEGATIVE 05/12/2014 0937   GLUCOSEU NEGATIVE 12/28/2013 1056   [place] 05/12/2014 0937   BILIRUBINUR NEGATIVE 05/12/2014 0937   KETONESUR  NEGATIVE 05/12/2014 0937   PROTEINUR NEGATIVE 05/12/2014 0937   UROBILINOGEN 0.2 05/12/2014 0937   NITRITE NEGATIVE 05/12/2014 0937   LEUKOCYTESUR NEGATIVE 05/12/2014 0937   Sepsis Labs: @LABRCNTIP (procalcitonin:4,lacticidven:4) )No results found for this or any previous visit (from the past 240 hour(s)).   Radiological Exams on Admission: No results found.  EKG; will order EKG.   Assessment/Plan Active Problems:   Chronic diastolic CHF (congestive heart failure) (HCC)   COPD (chronic obstructive pulmonary disease) (HCC)   Coronary artery disease   Paroxysmal a-fib (HCC)   Carcinoma of epiglottis (HCC)   Dyspnea  1-Respiratory infection; Patient presents with cough, mild dyspnea, feeling sick, tachycardia. No significant leukocytosis. Suspect bronchitis. Will check chest x ray to rule out PNA.  Nebulizer treatments. Cycle cardiac enzymes.   2-COPD; no wheezing on lung exam hold on started steroids. Nebulizer treatments ordered.  On 2 L oxygen as needed.   3-Tachycardia; appears sinus on telemetry.  Resume betablocker.  IV fluids.   4- Paroxysmal A fib.  Continue with metoprolol, eliquis.   5-Cancer of the epiglottis Has Peg tube.  Resume tube feeding.  6-Nausea, poor intake. Check KUB.  IV fluids.  7-Chronic  diastolic HF; check chest x ray, bnp  DVT prophylaxis: On Eliquis.  Code Status: Partial CODE , does not want to be intubated.  Family Communication: care discussed with husband who was at bedside.  Disposition Plan: home in 24 to 48 hours.  Consults called: none Admission status: Observation, telemetry    Niel Hummer A MD [facility] Pager [phone]  If 7PM-7AM, please contact night-coverage [email or web address] Password [facility]  08/15/2015, 12:29 PM

## 2015-08-15 NOTE — Telephone Encounter (Signed)
  Oncology Nurse Navigator Documentation  Navigator Location: [facility]Med Onc (08/15/15 [id number]) Navigator Encounter Type: Telephone (08/15/15 0746) Telephone: Incoming Call;Symptom Mgt (08/15/15 [id number])                     Received call from Ms Renee [name].  She reported she has been up all HS with a persistent cough, feels very weak.  I encouraged her to come to [facility] to be evaluated.  She stated she can safely be brought by her husband.  [name], RN, BSN, [place] at [place] [phone]                       Time Spent with Patient: 15 (08/15/15 0746)

## 2015-08-15 NOTE — Progress Notes (Signed)
  Oncology Nurse Navigator Documentation  Navigator Location: [facility] Onc (08/15/15 1000) Navigator Encounter Type: Other (08/15/15 1000)                   With RN Anderson Malta Malmfelt's assist, transported [name] via stretcher to 1442 following her assessment by Survivorship NP Mike Craze and Dr. Lisbeth Renshaw, initiation of NS IVF, administration of IV Zofran and hydrocodone via PEG.  Gave bedside report to [facility], including RT transport time of 1:15.  Patient's husband was present.  [name], RN, BSN, [place] at [place] [phone]                           Time Spent with Patient: 30 (08/15/15 1000)

## 2015-08-16 ENCOUNTER — Ambulatory Visit
Admission: RE | Admit: 2015-08-16 | Discharge: 2015-08-16 | Disposition: A | Discharge status: Home / Self Care | Payer: Medicare Other | Source: Ambulatory Visit | Attending: Radiation Oncology | Admitting: Radiation Oncology

## 2015-08-16 ENCOUNTER — Encounter: Payer: Self-pay | Admitting: Radiation Oncology

## 2015-08-16 VITALS — BP 148/70 | HR 102 | Temp 98.2°F

## 2015-08-16 DIAGNOSIS — C321 Malignant neoplasm of supraglottis: Secondary | ICD-10-CM

## 2015-08-16 DIAGNOSIS — J440 Chronic obstructive pulmonary disease with acute lower respiratory infection: Secondary | ICD-10-CM | POA: Diagnosis not present

## 2015-08-16 DIAGNOSIS — I5032 Chronic diastolic (congestive) heart failure: Secondary | ICD-10-CM | POA: Diagnosis not present

## 2015-08-16 LAB — BASIC METABOLIC PANEL
Anion gap: 5 (ref 5–15)
BUN: 9 mg/dL (ref 6–20)
CALCIUM: 8.5 mg/dL — AB (ref 8.9–10.3)
CO2: 27 mmol/L (ref 22–32)
CREATININE: 0.66 mg/dL (ref 0.44–1.00)
Chloride: 106 mmol/L (ref 101–111)
GFR calc non Af Amer: >60 mL/min (ref >60)
Glucose, Bld: 99 mg/dL (ref 65–99)
Potassium: 3.8 mmol/L (ref 3.5–5.1)
SODIUM: 138 mmol/L (ref 135–145)

## 2015-08-16 LAB — CBC
HCT: 34.5 % — ABNORMAL LOW (ref 36.0–46.0)
Hemoglobin: 10.4 g/dL — ABNORMAL LOW (ref 12.0–15.0)
MCH: 26.5 pg (ref 26.0–34.0)
MCHC: 30.1 g/dL (ref 30.0–36.0)
MCV: 88 fL (ref 78.0–100.0)
PLATELETS: 252 10*3/uL (ref 150–400)
RBC: 3.92 MIL/uL (ref 3.87–5.11)
RDW: 17.8 % — AB (ref 11.5–15.5)
WBC: 3.6 10*3/uL — ABNORMAL LOW (ref 4.0–10.5)

## 2015-08-16 LAB — GLUCOSE, CAPILLARY
GLUCOSE-CAPILLARY: 103 mg/dL — AB (ref 65–99)
GLUCOSE-CAPILLARY: 103 mg/dL — AB (ref 65–99)
GLUCOSE-CAPILLARY: 116 mg/dL — AB (ref 65–99)
GLUCOSE-CAPILLARY: 83 mg/dL (ref 65–99)
Glucose-Capillary: 112 mg/dL — ABNORMAL HIGH (ref 65–99)
Glucose-Capillary: 122 mg/dL — ABNORMAL HIGH (ref 65–99)
Glucose-Capillary: 93 mg/dL (ref 65–99)

## 2015-08-16 LAB — TROPONIN I

## 2015-08-16 NOTE — Progress Notes (Signed)
Nutrition Follow-up  DOCUMENTATION CODES:   Severe malnutrition in context of chronic illness  INTERVENTION:   -Continue 1 can of Osmolite 1.5 every 4 hours (0800, 1200, 1600, 2000). -Recommend free water flushes of 60 ml before and after each bolus. Also provide 240 ml additional free water BID via PEG or PO. -This tube feeding regimen provides 1420 kcal (81% of needs), 60 g protein (80% of needs) and 1684 ml H2O.  Goal rate: 5 cans of Osmolite 1.5 daily. Tube feeding regimen + free water flushes will provide 1775 kcal, 75 g protein and 1865 ml H2O.  Will advance as tolerated to goal.  RD to continue to monitor  NUTRITION DIAGNOSIS:   Malnutrition related to chronic illness as evidenced by percent weight loss, severe depletion of body fat, severe depletion of muscle mass.  Ongoing.  GOAL:   Patient will meet greater than or equal to 90% of their needs  Progressing.  MONITOR:   PO intake, Labs, Weight trends, TF tolerance, I & O's  REASON FOR ASSESSMENT:   Consult Enteral/tube feeding initiation and management  ASSESSMENT:   81 y.o. 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 Patient was complaining of nausea, poor oral intake, weakness, dry cough and mild dyspnea.   Patient reports tolerating bolus feeds well with no issues. She states she feels much better since having some nutrition. Pt with clear liquid tray at bedside, states she is eating bites of jello which is not causing a metallic taste in her mouth like most other foods. Pt to attempt to eat some italian ice as well.  Will continue order of 4 cans of Osmolite 1.5 daily (0800, 1200, 1600, 2000). If patient tolerates, recommend increase to 5 cans daily on 7/12. Explained goal rate to patient.  Medications: Zofran IV PRN Labs reviewed: CBGs: 93-103  Diet  Order:  Diet clear liquid Room service appropriate?: Yes; Fluid consistency:: Thin  Skin:  Reviewed, no issues  Last BM:  7/11  Height:   Ht Readings from Last 1 Encounters:  08/15/15 5\' 6"  (1.676 m)    Weight:   Wt Readings from Last 1 Encounters:  08/16/15 129 lb 13.6 oz (58.9 kg)    Ideal Body Weight:  59.1 kg  BMI:  Body mass index is 20.97 kg/(m^2).  Estimated Nutritional Needs:   Kcal:  1750-2000  Protein:  75-90g  Fluid:  2L/day  EDUCATION NEEDS:   Education needs addressed  Clayton Bibles, MS, RD, LDN Pager: [phone] After Hours Pager: [phone]

## 2015-08-16 NOTE — Progress Notes (Signed)
Renee [name] is here for her 18th fraction of radiation to her Larynx. She was admitted to the hospital yesterday for nausea, dehydration, coughing. She reports she is feeling much better today. Her cough has improved. She reports her headache has gone away. She has been able to tolerate her Bolus tube feedings since yesterday without nausea. She has some throat pain, and has received pain medicine several times while in the hospital. She has tried to eat food orally, but still has had difficulty due to pain, and feeling like the food is getting stuck. She also reports taste changes which also make eating difficult. Her neck is red, and tender. She is using the sonafine cream twice daily as directed. She reports some constipation, and reports they gave her sennakot today through her tube.   BP 148/70 mmHg  Pulse 102  Temp(Src) 98.2 F (36.8 C)  SpO2 98%   Wt Readings from Last 3 Encounters:  08/16/15 129 lb 13.6 oz (58.9 kg)  08/15/15 127 lb (57.607 kg)  08/10/15 127 lb 12.8 oz (57.97 kg)

## 2015-08-16 NOTE — Progress Notes (Signed)
PROGRESS NOTE    Renee [name]  [id number] DOB: 14-Jul-1934 DOA: 08/15/2015 PCP: [name], MD    Brief Narrative:   81 y.o. 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 Patient was complaining of nausea, poor oral intake, weakness, dry cough and mild dyspnea. She was found to be tachycardic  Assessment & Plan:   1-Respiratory infection; Patient presents with cough, mild dyspnea, feeling sick, tachycardia. No significant leukocytosis. Suspecting bronchitis. Chest x-ray negative for pneumonia. Nebulizer treatments.  - Cardiac enzymes negative - Suspecting viral etiology. Continue to provide supportive therapy. Given no fevers will hold antibiotics  2-COPD; no wheezing on lung exam hold on started steroids. Nebulizer treatments ordered.  On 2 L oxygen as needed.   3-Tachycardia; appears sinus on telemetry.  Resume betablocker.  IV fluids.   4- Paroxysmal A fib.  Continue with metoprolol, eliquis.   5-Cancer of the epiglottis Has Peg tube.  Resume tube feeding.   6-Nausea, poor intake. Check KUB.  IV fluids.   7-Chronic diastolic HF; check chest x ray, bnp   DVT prophylaxis: Pt on Eliquis Code Status:  Partial Family Communication: Discussed with patient directly Disposition Plan: Pending improvement in condition   Consultants:   none   Procedures: None  Antimicrobials: None  Subjective: Patient has no new complaints today. No acute issues overnight. Breathing minimally improved currently  Objective: Filed Vitals:   08/16/15 0735 08/16/15 1143 08/16/15 1545 08/16/15 1600  BP:    145/80  Pulse:    95  Temp:    98.2 F (36.8 C)  TempSrc:    Oral  Resp:    20  Height:      Weight:      SpO2: 92% 98% 85% 98%    Intake/Output Summary (Last 24 hours) at 08/16/15 1715 Last data filed  at 08/16/15 0600  Gross per 24 hour  Intake   1310 ml  Output      0 ml  Net   1310 ml   Filed Weights   08/15/15 1045 08/16/15 0450  Weight: 57.607 kg (127 lb) 58.9 kg (129 lb 13.6 oz)    Examination:  General exam: Appears calm and comfortable  Respiratory system: No increased work of breathing, equal chest rise, no rales Cardiovascular system: S1 & S2 heard, RRR. No JVD, murmurs, rubs, gallops or clicks. No pedal edema. Gastrointestinal system: Abdomen is nondistended, soft and nontender. No organomegaly or masses felt. Normal bowel sounds heard. Central nervous system: Alert and oriented. No focal neurological deficits. Extremities: Symmetric 5 x 5 power. Skin: No rashes, lesions or ulcers, on limited exam Psychiatry: Judgement and insight appear normal. Mood & affect appropriate.     Data Reviewed: I have personally reviewed following labs and imaging studies  CBC:  Recent Labs Lab 08/15/15 0941 08/16/15 0433  WBC 10.5* 3.6*  NEUTROABS 9.4*  --   HGB 13.0 10.4*  HCT 41.3 34.5*  MCV 86.9 88.0  PLT 348 [id number]   Basic Metabolic Panel:  Recent Labs Lab 08/10/15 1453 08/15/15 0941 08/16/15 0433  NA 137 139 138  K 3.1* 4.6 3.8  CL  --   --  106  CO2 29 28 27   GLUCOSE 93 125 99  BUN 8.9 10.2 9  CREATININE 0.9 0.8 0.66  CALCIUM 9.2 10.2 8.5*  MG 1.8  --   --   PHOS 2.7  --   --  GFR: Estimated Creatinine Clearance: 53.9 mL/min (by C-G formula based on Cr of 0.66). Liver Function Tests:  Recent Labs Lab 08/10/15 1453 08/15/15 0941  AST 17 23  ALT <9 <9  ALKPHOS 143 142  BILITOT 0.70 0.85  PROT 7.2 7.9  ALBUMIN 3.0* 3.3*   No results for input(s): LIPASE, AMYLASE in the last 168 hours. No results for input(s): AMMONIA in the last 168 hours. Coagulation Profile: No results for input(s): INR, PROTIME in the last 168 hours. Cardiac Enzymes:  Recent Labs Lab 08/15/15 1139 08/15/15 1724 08/15/15 2318  TROPONINI <0.03 <0.03 <0.03   BNP (last 3  results) No results for input(s): PROBNP in the last 8760 hours. HbA1C: No results for input(s): HGBA1C in the last 72 hours. CBG:  Recent Labs Lab 08/15/15 2009 08/16/15 0010 08/16/15 0454 08/16/15 0758 08/16/15 1253  GLUCAP 130* 116* 103* 93 112*   Lipid Profile: No results for input(s): CHOL, HDL, LDLCALC, TRIG, CHOLHDL, LDLDIRECT in the last 72 hours. Thyroid Function Tests: No results for input(s): TSH, T4TOTAL, FREET4, T3FREE, THYROIDAB in the last 72 hours. Anemia Panel: No results for input(s): VITAMINB12, FOLATE, FERRITIN, TIBC, IRON, RETICCTPCT in the last 72 hours. Sepsis Labs: No results for input(s): PROCALCITON, LATICACIDVEN in the last 168 hours.  No results found for this or any previous visit (from the past 240 hour(s)).       Radiology Studies: Dg Chest 2 View  08/15/2015  CLINICAL DATA:  History of head neck cancer, cough, congestion, shortness of breath, recent G-tube placement, just does not feel RIGHT, coronary artery disease, hypertension, emphysema, former smoker EXAM: CHEST  2 VIEW COMPARISON:  11/30/2014 FINDINGS: Normal heart size post CABG. Atherosclerotic calcification aorta. Mediastinal contours and pulmonary vascularity normal. Minimal lingular scarring. No acute infiltrate, pleural effusion or pneumothorax. Bones appear demineralized. Retained contrast in colon. IMPRESSION: Minimal lingular scarring. No acute abnormalities. Aortic atherosclerosis. Electronically Signed   By: Lavonia Dana M.D.   On: 08/15/2015 12:51   Dg Abd 1 View  08/15/2015  CLINICAL DATA:  PEG tube feeding. EXAM: ABDOMEN - 1 VIEW COMPARISON:  Gastrostomy tube placement 08/08/2015 FINDINGS: Patient is status post gastrostomy tube placement. The gastrostomy tube is seen overlying the gastric body. Oral contrast is seen opacifying the left colon to the level of the rectum. There is no evidence of small-bowel or colonic obstruction. IMPRESSION: Gastrostomy tube overlies the gastric  body. Oral contrast opacifies the left colon. Patient is status post gastrostomy tube placement on 08/08/2015. Therefore, this may represent residual intraluminal contrast, demonstrating slow clearing from the colon. Has the patient however had administration of oral contrast more recently? Electronically Signed   By: Fidela Salisbury M.D.   On: 08/15/2015 17:31    Scheduled Meds: . apixaban  5 mg Oral BID  . feeding supplement (OSMOLITE 1.5 CAL)  237 mL Per Tube QID  . guaiFENesin  600 mg Oral BID  . ipratropium-albuterol  3 mL Nebulization QID  . levothyroxine  25 mcg Oral QAC breakfast  . metoprolol succinate  25 mg Oral Daily  . pantoprazole sodium  40 mg Per Tube Daily  . polyvinyl alcohol  1 drop Both Eyes BID  . senna  1 tablet Oral Daily   Continuous Infusions: . sodium chloride 75 mL/hr at 08/16/15 1624     LOS: 1 day    Time spent: > 35 minutes  Velvet Bathe, MD [facility] Pager [phone]  If 7PM-7AM, please contact night-coverage [email or web address] Password [facility] 08/16/2015, 5:15  PM

## 2015-08-16 NOTE — Progress Notes (Signed)
A user error has taken place: encounter opened in error, closed for administrative reasons.

## 2015-08-17 ENCOUNTER — Ambulatory Visit
Admission: RE | Admit: 2015-08-17 | Discharge: 2015-08-17 | Disposition: A | Discharge status: Home / Self Care | Payer: Medicare Other | Source: Ambulatory Visit | Attending: Radiation Oncology | Admitting: Radiation Oncology

## 2015-08-17 ENCOUNTER — Encounter: Payer: Self-pay | Admitting: *Deleted

## 2015-08-17 ENCOUNTER — Other Ambulatory Visit: Payer: Self-pay | Admitting: Radiation Oncology

## 2015-08-17 DIAGNOSIS — J440 Chronic obstructive pulmonary disease with acute lower respiratory infection: Secondary | ICD-10-CM | POA: Diagnosis not present

## 2015-08-17 DIAGNOSIS — I5032 Chronic diastolic (congestive) heart failure: Secondary | ICD-10-CM | POA: Diagnosis not present

## 2015-08-17 DIAGNOSIS — C321 Malignant neoplasm of supraglottis: Secondary | ICD-10-CM | POA: Diagnosis not present

## 2015-08-17 DIAGNOSIS — J418 Mixed simple and mucopurulent chronic bronchitis: Secondary | ICD-10-CM

## 2015-08-17 DIAGNOSIS — J209 Acute bronchitis, unspecified: Secondary | ICD-10-CM | POA: Diagnosis not present

## 2015-08-17 LAB — GLUCOSE, CAPILLARY
GLUCOSE-CAPILLARY: 70 mg/dL (ref 65–99)
Glucose-Capillary: 102 mg/dL — ABNORMAL HIGH (ref 65–99)
Glucose-Capillary: 132 mg/dL — ABNORMAL HIGH (ref 65–99)

## 2015-08-17 MED ORDER — HYDROCODONE-ACETAMINOPHEN 7.5-325 MG/15ML PO SOLN: 10.0000 mL | Freq: Four times a day (QID) | ORAL | Status: DC | PRN

## 2015-08-17 NOTE — Progress Notes (Signed)
Weekly Management Note: Inpatient  Carcinoma of epiglottis (HCC) 161.1 C32.1    Current Dose:  36 Gy  Projected Dose: 70 Gy   Narrative:  The patient presents for routine under treatment assessment.  CBCT/MVCT images/Port film x-rays were reviewed.  The chart was checked. here for her 18th fraction of radiation to her Larynx. She was admitted to the hospital yesterday for nausea, dehydration, coughing. She reports she is feeling much better today. Her cough has improved. She reports her headache has gone away. She has been able to tolerate her Bolus tube feedings since yesterday without nausea. She has some throat pain, and has received pain medicine several times while in the hospital. She has tried to eat food orally, but still has had difficulty due to pain, and feeling like the food is getting stuck. She also reports taste changes which also make eating difficult. Her neck is red, and tender. She is using the sonafine cream twice daily as directed. She reports some constipation, and reports they gave her sennakot today through her tube.   Physical Findings:    BP 148/70 mmHg  Pulse 102  Temp(Src) 98.2 F (36.8 C)  SpO2 98%   Wt Readings from Last 3 Encounters:  08/16/15 129 lb 13.6 oz (58.9 kg)  08/15/15 127 lb (57.607 kg)  08/10/15 127 lb 12.8 oz (57.97 kg)    Oral cavity with minimal mucositis and no thrush.  Dry skin over neck w/ erythema  CBC    Component Value Date/Time   WBC 3.6* 08/16/2015 0433   WBC 10.5* 08/15/2015 0941   WBC 9.8 03/17/2011 1449   RBC 3.92 08/16/2015 0433   RBC 4.75 08/15/2015 0941   RBC 4.85 03/17/2011 1449   HGB 10.4* 08/16/2015 0433   HGB 13.0 08/15/2015 0941   HGB 13.4 03/17/2011 1449   HCT 34.5* 08/16/2015 0433   HCT 41.3 08/15/2015 0941   HCT 43.3 03/17/2011 1449   PLT 252 08/16/2015 0433   PLT 348 08/15/2015 0941   MCV 88.0 08/16/2015 0433   MCV 86.9 08/15/2015 0941   MCV 89.3 03/17/2011 1449   MCH 26.5 08/16/2015 0433   MCH 27.4 08/15/2015 0941   MCH 27.6 03/17/2011 1449   MCHC 30.1 08/16/2015 0433   MCHC 31.5 08/15/2015 0941   MCHC 30.9* 03/17/2011 1449   RDW 17.8* 08/16/2015 0433   RDW 17.7* 08/15/2015 0941   LYMPHSABS 0.5* 08/15/2015 0941   LYMPHSABS 3.5 12/22/2014 1151   MONOABS 0.6 08/15/2015 0941   MONOABS 0.9 12/22/2014 1151   EOSABS 0.0 08/15/2015 0941   EOSABS 0.3 12/22/2014 1151   BASOSABS 0.0 08/15/2015 0941   BASOSABS 0.1 12/22/2014 1151     CMP     Component Value Date/Time   NA 138 08/16/2015 0433   NA 139 08/15/2015 0941   NA 144 01/02/2014 0000   K 3.8 08/16/2015 0433   K 4.6 08/15/2015 0941   CL 106 08/16/2015 0433   CO2 27 08/16/2015 0433   CO2 28 08/15/2015 0941   GLUCOSE 99 08/16/2015 0433   GLUCOSE 125 08/15/2015 0941   GLUCOSE 72 01/02/2014 0000   BUN 9 08/16/2015 0433   BUN 10.2 08/15/2015 0941   BUN 13 01/02/2014 0000   CREATININE 0.66 08/16/2015 0433   CREATININE 0.8 08/15/2015 0941   CREATININE 0.96* 04/25/2015 1104   CALCIUM 8.5* 08/16/2015 0433   CALCIUM 10.2 08/15/2015 0941   PROT 7.9 08/15/2015 0941   PROT 6.5 09/21/2009 0630   ALBUMIN 3.3*  08/15/2015 0941   ALBUMIN 3.2* 09/21/2009 0630   AST 23 08/15/2015 0941   AST 23 09/21/2009 0630   ALT <9 08/15/2015 0941   ALT 15 09/21/2009 0630   ALKPHOS 142 08/15/2015 0941   ALKPHOS 127* 09/21/2009 0630   BILITOT 0.85 08/15/2015 0941   BILITOT 0.8 09/21/2009 0630   GFRNONAA >60 08/16/2015 0433   GFRAA >60 08/16/2015 0433     Impression:  The patient is tolerating radiotherapy.   Plan:  Continue radiotherapy as planned.  Appreciate supportive inpatient care. -----------------------------------  Eppie Gibson, MD

## 2015-08-17 NOTE — Progress Notes (Signed)
[facility] Therapy Treatment Record Phone [phone]   Radiation Therapy was administered to Renee [name] on: 08/17/2015  2:04 PM and was treatment # 19 out of a planned course of 35 treatments.  Radiation Treatment  1). Beam photons with 6-10 energy and Photons 6-10 MeV  2). Brachytherapy None  3). Stereotactic Radiosurgery None  4). Other Radiation None     Lamaj Metoyer J, Rad Therap

## 2015-08-17 NOTE — Discharge Summary (Signed)
Physician Discharge Summary  Renee [name] [id number] DOB: 05/24/1934 DOA: 08/15/2015  PCP: [name], MD  Admit date: 08/15/2015 Discharge date: 08/17/2015  Time spent: 35 minutes  Recommendations for Outpatient Follow-up:  1. Please call the [facility] tomorrow to schedule a follow-up appointment with outpatient labs for next week, first available.  Patient needs CBC and BMP. 2. Continue radiation oncology appointments as scheduled. 3. The patient has two areas of dry skin on her face.  Advised to moisturize with Vaseline or Aquaphor daily for now and monitor.  Do not use antibiotic or steroid creams for now.  She will need repeat assessment next week.   Discharge Diagnoses:  Principal Problem:   Acute bronchitis Active Problems:   Chronic diastolic CHF (congestive heart failure) (HCC)   COPD (chronic obstructive pulmonary disease) (HCC)   Coronary artery disease   Paroxysmal a-fib (HCC)   Carcinoma of epiglottis ([place])   Discharge Condition: Stable  Diet recommendation:   -Continue 1 can of Osmolite 1.5 every 4 hours (0800, 1200, 1600, 2000). -Recommend free water flushes of 60 ml before and after each bolus. Also provide 240 ml additional free water BID via PEG or PO. -This tube feeding regimen provides 1420 kcal (81% of needs), 60 g protein (80% of needs) and 1684 ml H2O.  Goal rate: 5 cans of Osmolite 1.5 daily. Tube feeding regimen + free water flushes will provide 1775 kcal, 75 g protein and 1865 ml H2O.   Oral intake as tolerated or as directed by oncology providers.  Filed Weights   08/15/15 1045 08/16/15 0450 08/17/15 0542  Weight: 57.607 kg (127 lb) 58.9 kg (129 lb 13.6 oz) 58.7 kg (129 lb 6.6 oz)    History of present illness:  81yo woman with cancer of the epiglottis, chronic diastolic heart failure (compensated), CAD, and hypothyroidism and severe protein calorie malnutrition who presented from the radiation oncology office with evidence of  dehydration and acute bronchitis, likely viral in etiology or provoked by mild aspiration of tube feedings (she reported some problems with regurgitation since starting this therapy one week ago).  Hospital course  1-Respiratory infection; Probable acute bronchitis of viral etiology.  I suspect CBC was hemoconcentrated on presentation in the setting of dehydration.  Repeat CBC appears closer to baseline.  She has a mild leukopenia and chronic anemia.  The patient improved with supportive care.  She never received antibiotics.  2-COPD; stable. On 2 L oxygen as needed. Continue home nebulizer treatments.  3-Tachycardia; Improved with IV fluids resuscitation and home metoprolol dose.  4- Paroxysmal A fib. No acute issues. Continue with metoprolol, eliquis.   5-Cancer of the epiglottis Has Peg tube.  Tolerating tube feedings as previously prescribed.  6-Nausea, poor intake. Improved.  On tube feedings.  7-Chronic diastolic HF; compensated   Procedures:  Radiation therapy  Consultations:  Radiation oncology  Discharge Exam: Filed Vitals:   08/17/15 0430 08/17/15 0542  BP: 170/67 136/69  Pulse: 81   Temp: 99 F (37.2 C)   Resp: 12     General: Awake and alert, husband present.  Nontoxic appearing.  NAD.  Expecting to go home today. Cardiovascular: NR/RR Respiratory: Breathing unlabored.  No wheeze or ronchi GI: Abdomen soft and compressible, bowel sounds are present.  PEG tube dressing C/D/I.  No apparent drainage. Skin: Patient points out two dry patches on her face.  Skin is scaly.  Does not appear fungal to me.   Discharge Instructions    Current Discharge Medication List  CONTINUE these medications which have CHANGED   Details  HYDROcodone-acetaminophen (HYCET) 7.5-325 mg/15 ml solution Place 10 mLs into feeding tube every 6 (six) hours as needed for moderate pain. Qty: 120 mL, Refills: 0      CONTINUE these medications which have NOT CHANGED   Details   acetaminophen (TYLENOL) 500 MG tablet Take 500 mg by mouth every 6 (six) hours as needed for moderate pain or headache.     albuterol (PROAIR HFA) 108 (90 Base) MCG/ACT inhaler Inhale 2 puffs into the lungs every 6 (six) hours as needed for wheezing or shortness of breath.     ALPRAZolam (XANAX) 0.25 MG tablet Take 0.125 mg by mouth 2 (two) times daily as needed for anxiety or sleep.     apixaban (ELIQUIS) 5 MG TABS tablet Take 1 tablet (5 mg total) by mouth 2 (two) times daily. Qty: 60 tablet, Refills: 1    cholecalciferol (VITAMIN D) 1000 UNITS tablet Take 1,000 Units by mouth daily.    esomeprazole (NEXIUM) 40 MG capsule Take 40 mg by mouth daily before breakfast.      levothyroxine (SYNTHROID, LEVOTHROID) 25 MCG tablet Take 25 mcg by mouth daily before breakfast.     lidocaine (XYLOCAINE) 2 % solution Mix 1 part 2%viscous lidocaine,1part H2O.Swish and/or swallow 52mL of this mixture,51min before meals and at bedtime, up to QID Qty: 100 mL, Refills: 5   Associated Diagnoses: Cancer, epiglottis (HCC)    metoprolol succinate (TOPROL-XL) 25 MG 24 hr tablet Take 1 tablet (25 mg total) by mouth daily.    nitroGLYCERIN (NITROSTAT) 0.4 MG SL tablet Place 0.4 mg under the tongue every 5 (five) minutes as needed for chest pain.    Nutritional Supplements (FEEDING SUPPLEMENT, OSMOLITE 1.5 CAL,) LIQD Give 1 can of Osmolite 1.5 or equivalent - 5 times daily with 60 cc free water before and after bolus feeding via PEG.  Give additional 240 cc water BID. Send formula and supplies. Qty: 1185 mL, Refills: 0   Associated Diagnoses: Carcinoma of epiglottis (HCC)    Polyethyl Glycol-Propyl Glycol (SYSTANE OP) Place 1 drop into both eyes 2 (two) times daily.     Potassium Chloride 40 MEQ/15ML (20%) SOLN Place 15 mLs into feeding tube 2 (two) times daily. Take 15 mLs in the morning and 15 mLs in the evening for a total of 80 mEq of potassium per day. Qty: 1 Bottle, Refills: 1   Associated Diagnoses:  Carcinoma of epiglottis ([place]); Hypokalemia    senna (SENOKOT) 8.6 MG tablet Take 1 tablet by mouth daily as needed for constipation.     sodium fluoride (FLUORISHIELD) 1.1 % GEL dental gel Instill one drop of gel per tooth space of fluoride tray. Place over teeth for 5 minutes. Remove. Spit out excess. Repeat nightly. Qty: 120 mL, Refills: prn    ondansetron (ZOFRAN) 4 MG tablet Take 1 tablet (4 mg total) by mouth every 8 (eight) hours as needed for nausea or vomiting. Qty: 50 tablet, Refills: 3   Associated Diagnoses: Carcinoma of epiglottis ([place]); Nausea and vomiting, intractability of vomiting not specified, unspecified vomiting type    OXYGEN Inhale 2 L into the lungs as needed (for shortness of breath).       STOP taking these medications     oxyCODONE-acetaminophen (PERCOCET/ROXICET) 5-325 MG tablet      sucralfate (CARAFATE) 1 g tablet      ipratropium-albuterol (DUONEB) 0.5-2.5 (3) MG/3ML SOLN        Allergies  Allergen Reactions  .  Levofloxacin Nausea Only    Unable to tolerate more than 7 days  . Penicillins Swelling and Rash    Swelling location undefined. Has patient had a PCN reaction causing immediate rash, facial/tongue/throat swelling, SOB or lightheadedness with hypotension: Yes Has patient had a PCN reaction causing severe rash involving mucus membranes or skin necrosis: No Has patient had a PCN reaction that required hospitalization Unk Has patient had a PCN reaction occurring within the last 10 years: Unk If all of the above answers are "NO", then may proceed with Cephalosporin use.   [name] Spiriva [Tiotropium Bromide Monohydrate] Other (See Comments)    Throat irritation  . Statins Other (See Comments)    Myalgias  . Budesonide-Formoterol Fumarate Other (See Comments)    Tachycardia  . Morphine Other (See Comments)    double vision  . Phenazopyridine Hcl Other (See Comments)    *piridium* face red  . Pneumococcal Vaccines Swelling    [place] of the skin   . Prednisone Swelling    Can't take PO but injectable is fine  . Sulfa Antibiotics Rash  . Sulfonamide Derivatives Rash   Follow-up Information    Follow up with [facility]. Call in 1 day.   Why:  Please call the [facility] tomorrow to set up an appointment to have your labs checked (CBC and BMP) next week, 1st available.       The results of significant diagnostics from this hospitalization (including imaging, microbiology, ancillary and laboratory) are listed below for reference.    Significant Diagnostic Studies: Dg Chest 2 View  08/15/2015  CLINICAL DATA:  History of head neck cancer, cough, congestion, shortness of breath, recent G-tube placement, just does not feel RIGHT, coronary artery disease, hypertension, emphysema, former smoker EXAM: CHEST  2 VIEW COMPARISON:  11/30/2014 FINDINGS: Normal heart size post CABG. Atherosclerotic calcification aorta. Mediastinal contours and pulmonary vascularity normal. Minimal lingular scarring. No acute infiltrate, pleural effusion or pneumothorax. Bones appear demineralized. Retained contrast in colon. IMPRESSION: Minimal lingular scarring. No acute abnormalities. Aortic atherosclerosis. Electronically Signed   By: Lavonia Dana M.D.   On: 08/15/2015 12:51   Dg Abd 1 View  08/15/2015  CLINICAL DATA:  PEG tube feeding. EXAM: ABDOMEN - 1 VIEW COMPARISON:  Gastrostomy tube placement 08/08/2015 FINDINGS: Patient is status post gastrostomy tube placement. The gastrostomy tube is seen overlying the gastric body. Oral contrast is seen opacifying the left colon to the level of the rectum. There is no evidence of small-bowel or colonic obstruction. IMPRESSION: Gastrostomy tube overlies the gastric body. Oral contrast opacifies the left colon. Patient is status post gastrostomy tube placement on 08/08/2015. Therefore, this may represent residual intraluminal contrast, demonstrating slow clearing from the colon. Has the patient however had  administration of oral contrast more recently? Electronically Signed   By: Fidela Salisbury M.D.   On: 08/15/2015 17:31   Ir Gastrostomy Tube Mod Sed  08/08/2015  INDICATION: Layering G all cancer EXAM: PERC PLACEMENT GASTROSTOMY MEDICATIONS: Vancomycin; Antibiotics were administered within 1 hour of the procedure. Glucagon 1 mg IV ANESTHESIA/SEDATION: Versed 4 mg IV; Fentanyl 100 mcg IV Moderate Sedation Time:  20 The patient was continuously monitored during the procedure by the interventional radiology nurse under my direct supervision. CONTRAST:  10 cc Omnipaque 300. - administered into the gastric lumen. FLUOROSCOPY TIME:  Fluoroscopy Time: 9 minutes 12 seconds (12 mGy). COMPLICATIONS: None immediate. PROCEDURE: The procedure, risks, benefits, and alternatives were explained to the patient. Questions regarding the procedure  were encouraged and answered. The patient understands and consents to the procedure. The epigastrium was prepped with Betadine in a sterile fashion, and a sterile drape was applied covering the operative field. A sterile gown and sterile gloves were used for the procedure. A 5-French orogastric tube is placed under fluoroscopic guidance. Scout imaging of the abdomen confirms barium within the transverse colon. The stomach was distended with gas. Under fluoroscopic guidance, an 18 gauge needle was utilized to puncture the anterior wall of the body of the stomach. An Amplatz wire was advanced through the needle passing a T fastener into the lumen of the stomach. The T fastener was secured for gastropexy. A 9-French sheath was inserted. A snare was advanced through the 9-French sheath. A Britta Mccreedy was advanced through the orogastric tube. It was snared then pulled out the oral cavity, pulling the snare, as well. The leading edge of the gastrostomy was attached to the snare. It was then pulled down the esophagus and out the percutaneous site. It was secured in place. Contrast was injected. The  image demonstrates placement of a 20-French pull-through type gastrostomy tube into the body of the stomach. IMPRESSION: Successful 20 French pull-through gastrostomy. Electronically Signed   By: Marybelle Killings M.D.   On: 08/08/2015 15:06    Microbiology: No results found for this or any previous visit (from the past 240 hour(s)).   Labs: Basic Metabolic Panel:  Recent Labs Lab 08/10/15 1453 08/15/15 0941 08/16/15 0433  NA 137 139 138  K 3.1* 4.6 3.8  CL  --   --  106  CO2 29 28 27   GLUCOSE 93 125 99  BUN 8.9 10.2 9  CREATININE 0.9 0.8 0.66  CALCIUM 9.2 10.2 8.5*  MG 1.8  --   --   PHOS 2.7  --   --    Liver Function Tests:  Recent Labs Lab 08/10/15 1453 08/15/15 0941  AST 17 23  ALT <9 <9  ALKPHOS 143 142  BILITOT 0.70 0.85  PROT 7.2 7.9  ALBUMIN 3.0* 3.3*   CBC:  Recent Labs Lab 08/15/15 0941 08/16/15 0433  WBC 10.5* 3.6*  NEUTROABS 9.4*  --   HGB 13.0 10.4*  HCT 41.3 34.5*  MCV 86.9 88.0  PLT 348 252   Cardiac Enzymes:  Recent Labs Lab 08/15/15 1139 08/15/15 1724 08/15/15 2318  TROPONINI <0.03 <0.03 <0.03    CBG:  Recent Labs Lab 08/16/15 2020 08/16/15 2337 08/17/15 0427 08/17/15 0630 08/17/15 0803  GLUCAP 83 122* 70 102* 132*       Signed:  Eber Jones MD.  [facility] 08/17/2015, 1:51 PM

## 2015-08-17 NOTE — Progress Notes (Signed)
[facility] Therapy Treatment Record Phone [phone]   Radiation Therapy was administered to Renee [name] on: 08/17/2015  2:05 PM and was treatment # 18 out of a planned course of 35 treatments. This fraction of treatment occurred on 7/11 not 7/12.  Radiation Treatment  1). Beam photons with 6-10 energy  2). Brachytherapy None  3). Stereotactic Radiosurgery None  4). Other Radiation None     Renee [name] J, Rad Therap

## 2015-08-17 NOTE — Progress Notes (Signed)
Nutrition Follow-up  DOCUMENTATION CODES:   Severe malnutrition in context of chronic illness  INTERVENTION:   -Continue 1 can of Osmolite 1.5 every 4 hours (0800, 1200, 1600, 2000). -Recommend free water flushes of 60 ml before and after each bolus. Also provide 240 ml additional free water BID via PEG or PO. -This tube feeding regimen provides 1420 kcal (81% of needs), 60 g protein (80% of needs) and 1684 ml H2O.  Goal rate: 5 cans of Osmolite 1.5 daily. Tube feeding regimen + free water flushes will provide 1775 kcal, 75 g protein and 1865 ml H2O.   -Will advance as tolerated to goal. -Encouraged pt to follow-up with [place] RD   NUTRITION DIAGNOSIS:   Malnutrition related to chronic illness as evidenced by percent weight loss, severe depletion of body fat, severe depletion of muscle mass.  Ongoing.  GOAL:   Patient will meet greater than or equal to 90% of their needs  Progressing.  MONITOR:   PO intake, Labs, Weight trends, TF tolerance, I & O's  ASSESSMENT:   81 y.o. 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 Patient was complaining of nausea, poor oral intake, weakness, dry cough and mild dyspnea.   Patient reports no complaints. She is tolerating her bolus feeds. Denies any nausea. States she still has pain in her throat. She is not taking anything PO. Pt missed one feeding yesterday d/t radiation treatment. Pt encouraged to administer 4 cans today. She is hoping to go home. Explained that her goal is 5 cans daily of Osmolite 1.5. She states she will try to administer this amount once she goes home. Encouraged follow-up with [place] RD upon next visit at the [facility]  Medications reviewed.  Labs reviewed: CBGs: 70-132  Diet Order:  Diet clear liquid Room service appropriate?: Yes; Fluid  consistency:: Thin  Skin:  Reviewed, no issues  Last BM:  7/11  Height:   Ht Readings from Last 1 Encounters:  08/15/15 5\' 6"  (1.676 m)    Weight:   Wt Readings from Last 1 Encounters:  08/17/15 129 lb 6.6 oz (58.7 kg)    Ideal Body Weight:  59.1 kg  BMI:  Body mass index is 20.9 kg/(m^2).  Estimated Nutritional Needs:   Kcal:  1750-2000  Protein:  75-90g  Fluid:  2L/day  EDUCATION NEEDS:   Education needs addressed  Clayton Bibles, MS, RD, LDN Pager: [phone] After Hours Pager: [phone]

## 2015-08-17 NOTE — Care Management Obs Status (Signed)
[place] NOTIFICATION   Patient Details  Name: Renee [name] MRN: [id number] Date of Birth: 19-Mar-1934   Medicare Observation Status Notification Given:  Yes    [name], RN 08/17/2015, 2:26 PM

## 2015-08-17 NOTE — Discharge Instructions (Signed)
°-  Continue 1 can of Osmolite 1.5 every 4 hours (0800, 1200, 1600, 2000). -Recommend free water flushes of 60 ml before and after each bolus. Also provide 240 ml additional free water BID via PEG or PO. -This tube feeding regimen provides 1420 kcal (81% of needs), 60 g protein (80% of needs) and 1684 ml H2O.  Goal rate: 5 cans of Osmolite 1.5 daily. Tube feeding regimen + free water flushes will provide 1775 kcal, 75 g protein and 1865 ml H2O.   -Will advance as tolerated to goal. -Encouraged pt to follow-up with [place]

## 2015-08-17 NOTE — Progress Notes (Signed)
  Oncology Nurse Navigator Documentation  Navigator Location: [facility] (08/17/15 1220) Navigator Encounter Type: Other (08/17/15 1220)           Patient Visit Type: Inpatient (08/17/15 1220)         Visited Ms. London [id number] to check on her well-being.  Her husband was at the bedside. She reported feeling better since Monday's admission, is tolerating RT. I provided support and encouragement. She expects to be DC'ed today.  She denied any needs at this time.  [name], RN, BSN, [place] at [place] [phone]                         Time Spent with Patient: 30 (08/17/15 1220)

## 2015-08-18 ENCOUNTER — Ambulatory Visit
Admission: RE | Admit: 2015-08-18 | Discharge: 2015-08-18 | Disposition: A | Discharge status: Home / Self Care | Payer: Medicare Other | Source: Ambulatory Visit | Attending: Radiation Oncology | Admitting: Radiation Oncology

## 2015-08-18 DIAGNOSIS — C321 Malignant neoplasm of supraglottis: Secondary | ICD-10-CM | POA: Diagnosis not present

## 2015-08-18 DIAGNOSIS — Z510 Encounter for antineoplastic radiation therapy: Secondary | ICD-10-CM | POA: Diagnosis not present

## 2015-08-18 DIAGNOSIS — C320 Malignant neoplasm of glottis: Secondary | ICD-10-CM | POA: Diagnosis not present

## 2015-08-18 DIAGNOSIS — Z87891 Personal history of nicotine dependence: Secondary | ICD-10-CM | POA: Diagnosis not present

## 2015-08-18 LAB — GLUCOSE, CAPILLARY: GLUCOSE-CAPILLARY: 96 mg/dL (ref 65–99)

## 2015-08-19 ENCOUNTER — Ambulatory Visit
Admission: RE | Admit: 2015-08-19 | Discharge: 2015-08-19 | Disposition: A | Discharge status: Home / Self Care | Payer: Medicare Other | Source: Ambulatory Visit | Attending: Radiation Oncology | Admitting: Radiation Oncology

## 2015-08-19 DIAGNOSIS — C321 Malignant neoplasm of supraglottis: Secondary | ICD-10-CM | POA: Diagnosis not present

## 2015-08-19 DIAGNOSIS — Z510 Encounter for antineoplastic radiation therapy: Secondary | ICD-10-CM | POA: Diagnosis not present

## 2015-08-19 DIAGNOSIS — C320 Malignant neoplasm of glottis: Secondary | ICD-10-CM | POA: Diagnosis not present

## 2015-08-19 DIAGNOSIS — Z87891 Personal history of nicotine dependence: Secondary | ICD-10-CM | POA: Diagnosis not present

## 2015-08-22 ENCOUNTER — Ambulatory Visit
Admission: RE | Admit: 2015-08-22 | Discharge: 2015-08-22 | Disposition: A | Discharge status: Home / Self Care | Payer: Medicare Other | Source: Ambulatory Visit | Attending: Radiation Oncology | Admitting: Radiation Oncology

## 2015-08-22 ENCOUNTER — Encounter: Payer: Self-pay | Admitting: Radiation Oncology

## 2015-08-22 ENCOUNTER — Ambulatory Visit: Payer: Medicare Other

## 2015-08-22 ENCOUNTER — Encounter: Payer: Self-pay | Admitting: *Deleted

## 2015-08-22 ENCOUNTER — Ambulatory Visit: Payer: Self-pay

## 2015-08-22 VITALS — BP 89/57 | HR 113 | Temp 97.3°F | Ht 66.0 in | Wt 126.9 lb

## 2015-08-22 DIAGNOSIS — Z87891 Personal history of nicotine dependence: Secondary | ICD-10-CM | POA: Diagnosis not present

## 2015-08-22 DIAGNOSIS — C321 Malignant neoplasm of supraglottis: Secondary | ICD-10-CM

## 2015-08-22 DIAGNOSIS — Z510 Encounter for antineoplastic radiation therapy: Secondary | ICD-10-CM | POA: Diagnosis not present

## 2015-08-22 DIAGNOSIS — C320 Malignant neoplasm of glottis: Secondary | ICD-10-CM | POA: Diagnosis not present

## 2015-08-22 LAB — CBC WITH DIFFERENTIAL/PLATELET
BASO%: 1 % (ref 0.0–2.0)
Basophils Absolute: 0.1 10*3/uL (ref 0.0–0.1)
EOS ABS: 0.1 10*3/uL (ref 0.0–0.5)
EOS%: 1 % (ref 0.0–7.0)
HCT: 39 % (ref 34.8–46.6)
HGB: 12.3 g/dL (ref 11.6–15.9)
LYMPH%: 8.1 % — AB (ref 14.0–49.7)
MCH: 27 pg (ref 25.1–34.0)
MCHC: 31.6 g/dL (ref 31.5–36.0)
MCV: 85.3 fL (ref 79.5–101.0)
MONO#: 0.6 10*3/uL (ref 0.1–0.9)
MONO%: 10.3 % (ref 0.0–14.0)
NEUT#: 4.8 10*3/uL (ref 1.5–6.5)
NEUT%: 79.6 % — AB (ref 38.4–76.8)
PLATELETS: 406 10*3/uL — AB (ref 145–400)
RBC: 4.57 10*6/uL (ref 3.70–5.45)
RDW: 18.5 % — AB (ref 11.2–14.5)
WBC: 6 10*3/uL (ref 3.9–10.3)
lymph#: 0.5 10*3/uL — ABNORMAL LOW (ref 0.9–3.3)

## 2015-08-22 LAB — BASIC METABOLIC PANEL
Anion Gap: 8 mEq/L (ref 3–11)
BUN: 16.3 mg/dL (ref 7.0–26.0)
CHLORIDE: 100 meq/L (ref 98–109)
CO2: 29 mEq/L (ref 22–29)
Calcium: 10 mg/dL (ref 8.4–10.4)
Creatinine: 0.9 mg/dL (ref 0.6–1.1)
EGFR: 63 mL/min/{1.73_m2} — AB (ref >90)
Glucose: 107 mg/dl (ref 70–140)
POTASSIUM: 5.5 meq/L — AB (ref 3.5–5.1)
SODIUM: 138 meq/L (ref 136–145)

## 2015-08-22 MED ORDER — HYDROCODONE-ACETAMINOPHEN 7.5-325 MG/15ML PO SOLN: 15.0000 mL | ORAL | Status: DC | PRN

## 2015-08-22 MED ORDER — FENTANYL 12 MCG/HR TD PT72: 12.5000 ug | MEDICATED_PATCH | TRANSDERMAL | Status: DC

## 2015-08-22 NOTE — Progress Notes (Signed)
Renee [name] is here for her 22nd fraction of radiation to her Larynx. She reports pain in her throat a 5/10. She is taking Hycet 10-15 ml about every 6 hours for pain. She is not taking much orally except some water. She is instilling 4 cans of osmolite daiy into her feeding tube. She plans to increase it to 5 cans today. She is instilling 480 cc's water into her feeding tube daily. She also drinks water throughout the day. Her Neck is red, irritated with dry skin noted. She is applying neosporin to her neck and sonafine cream. She has had some constipation, but had a bowel movement today after taking a sennakot.  BP 124/56 mmHg  Pulse 101  Temp(Src) 97.3 F (36.3 C)  Ht 5\' 6"  (1.676 m)  Wt 126 lb 14.4 oz (57.561 kg)  BMI 20.49 kg/m2  SpO2 97%   Wt Readings from Last 3 Encounters:  08/22/15 126 lb 14.4 oz (57.561 kg)  08/17/15 129 lb 6.6 oz (58.7 kg)  08/15/15 127 lb (57.607 kg)

## 2015-08-22 NOTE — Progress Notes (Signed)
Weekly Management Note:  Outpatient    ICD-9-CM ICD-10-CM   1. Carcinoma of epiglottis (HCC) 161.1 C32.1 HYDROcodone-acetaminophen (HYCET) 7.5-325 mg/15 ml solution     fentaNYL (DURAGESIC) 12 MCG/HR     Basic metabolic panel    Current Dose: 44 Gy  Projected Dose: 70 Gy   Narrative:  The patient presents for routine under treatment assessment.  CBCT/MVCT images/Port film x-rays were reviewed.  The chart was checked.  Renee [name] is here for her 22nd fraction of radiation to her Larynx. She reports pain in her throat a 5/10. She is taking Hycet 10-15 ml about every 6 hours for pain. She is not taking much orally except some water. She is instilling 4 cans of osmolite daily into her feeding tube. She plans to increase it to 5 cans today. She is instilling 480 cc's water into her feeding tube daily. She also drinks water throughout the day. Her Neck is red, irritated with dry skin noted. She is applying neosporin to her neck and sonafine cream. She has had some constipation, but had a bowel movement today after taking a senakot.  Physical Findings:  Wt Readings from Last 3 Encounters:  08/22/15 126 lb 14.4 oz (57.561 kg)  08/17/15 129 lb 6.6 oz (58.7 kg)  08/15/15 127 lb (57.607 kg)    height is 5\' 6"  (1.676 m) and weight is 126 lb 14.4 oz (57.561 kg). Her temperature is 97.3 F (36.3 C). Her blood pressure is 89/57 and her pulse is 113. Her oxygen saturation is 97%.    Vitals with BMI 08/22/2015 08/22/2015  Height  5\' 6"   Weight  126 lbs 14 oz  BMI  [id number]  Systolic 89 [id number]  Diastolic 57 56  Pulse [id number] 101  Respirations       Erythema with minimal mucositis in her oral pharynx. Neck has bright erythema with some mild radiation dermatitis over the back of the right neck.  CBC    Component Value Date/Time   WBC 6.0 08/22/2015 1340   WBC 3.6* 08/16/2015 0433   WBC 9.8 03/17/2011 1449   RBC 4.57 08/22/2015 1340   RBC 3.92 08/16/2015 0433   RBC 4.85 03/17/2011 1449   HGB 12.3  08/22/2015 1340   HGB 10.4* 08/16/2015 0433   HGB 13.4 03/17/2011 1449   HCT 39.0 08/22/2015 1340   HCT 34.5* 08/16/2015 0433   HCT 43.3 03/17/2011 1449   PLT 406* 08/22/2015 1340   PLT 252 08/16/2015 0433   MCV 85.3 08/22/2015 1340   MCV 88.0 08/16/2015 0433   MCV 89.3 03/17/2011 1449   MCH 27.0 08/22/2015 1340   MCH 26.5 08/16/2015 0433   MCH 27.6 03/17/2011 1449   MCHC 31.6 08/22/2015 1340   MCHC 30.1 08/16/2015 0433   MCHC 30.9* 03/17/2011 1449   RDW 18.5* 08/22/2015 1340   RDW 17.8* 08/16/2015 0433   LYMPHSABS 0.5* 08/22/2015 1340   LYMPHSABS 3.5 12/22/2014 1151   MONOABS 0.6 08/22/2015 1340   MONOABS 0.9 12/22/2014 1151   EOSABS 0.1 08/22/2015 1340   EOSABS 0.3 12/22/2014 1151   BASOSABS 0.1 08/22/2015 1340   BASOSABS 0.1 12/22/2014 1151    CMP     Component Value Date/Time   NA 138 08/22/2015 1339   NA 138 08/16/2015 0433   NA 144 01/02/2014 0000   K 5.5* 08/22/2015 1339   K 3.8 08/16/2015 0433   CL 106 08/16/2015 0433   CO2 29 08/22/2015 1339   CO2 27 08/16/2015 0433  GLUCOSE 107 08/22/2015 1339   GLUCOSE 99 08/16/2015 0433   GLUCOSE 72 01/02/2014 0000   BUN 16.3 08/22/2015 1339   BUN 9 08/16/2015 0433   BUN 13 01/02/2014 0000   CREATININE 0.9 08/22/2015 1339   CREATININE 0.66 08/16/2015 0433   CREATININE 0.96* 04/25/2015 1104   CALCIUM 10.0 08/22/2015 1339   CALCIUM 8.5* 08/16/2015 0433   PROT 7.9 08/15/2015 0941   PROT 6.5 09/21/2009 0630   ALBUMIN 3.3* 08/15/2015 0941   ALBUMIN 3.2* 09/21/2009 0630   AST 23 08/15/2015 0941   AST 23 09/21/2009 0630   ALT <9 08/15/2015 0941   ALT 15 09/21/2009 0630   ALKPHOS 142 08/15/2015 0941   ALKPHOS 127* 09/21/2009 0630   BILITOT 0.85 08/15/2015 0941   BILITOT 0.8 09/21/2009 0630   GFRNONAA >60 08/16/2015 0433   GFRAA >60 08/16/2015 0433    Impression:  The patient is tolerating radiotherapy.   Plan:  Continue radiotherapy as planned. I recommend she drinks more water due to her low standing blood  pressure/tachycardia. I suspect she made need more sodium, she states she craves salt.  I recommend that she puts 1/4 tsp salt in 480 cc of  the water she injects into her PEG tube. I refilled her Hycet. I prescribed her Fentanyl patches to help with her throat pain.  Hold K+ supplements due to high K+ on BMP today. She will see Mike Craze, NP later this week to check her vitals, hydration status, pain control, and BMP.  -----------------------------------  Eppie Gibson, MD    This document serves as a record of services personally performed by Eppie Gibson, MD. It was created on her behalf by Lendon Collar, a trained medical scribe. The creation of this record is based on the scribe's personal observations and the provider's statements to them. This document has been checked and approved by the attending provider.

## 2015-08-23 ENCOUNTER — Other Ambulatory Visit: Payer: Self-pay

## 2015-08-23 ENCOUNTER — Encounter: Payer: Self-pay | Admitting: Internal Medicine

## 2015-08-23 ENCOUNTER — Ambulatory Visit
Admission: RE | Admit: 2015-08-23 | Discharge: 2015-08-23 | Disposition: A | Discharge status: Home / Self Care | Payer: Medicare Other | Source: Ambulatory Visit | Attending: Radiation Oncology | Admitting: Radiation Oncology

## 2015-08-23 ENCOUNTER — Ambulatory Visit (INDEPENDENT_AMBULATORY_CARE_PROVIDER_SITE_OTHER): Payer: Medicare Other | Admitting: Internal Medicine

## 2015-08-23 VITALS — BP 128/60 | HR 109 | Ht 66.0 in | Wt 127.2 lb

## 2015-08-23 DIAGNOSIS — I4510 Unspecified right bundle-branch block: Secondary | ICD-10-CM

## 2015-08-23 DIAGNOSIS — Z87891 Personal history of nicotine dependence: Secondary | ICD-10-CM | POA: Diagnosis not present

## 2015-08-23 DIAGNOSIS — C320 Malignant neoplasm of glottis: Secondary | ICD-10-CM | POA: Diagnosis not present

## 2015-08-23 DIAGNOSIS — I480 Paroxysmal atrial fibrillation: Secondary | ICD-10-CM

## 2015-08-23 DIAGNOSIS — Z510 Encounter for antineoplastic radiation therapy: Secondary | ICD-10-CM | POA: Diagnosis not present

## 2015-08-23 DIAGNOSIS — I951 Orthostatic hypotension: Secondary | ICD-10-CM | POA: Diagnosis not present

## 2015-08-23 DIAGNOSIS — C321 Malignant neoplasm of supraglottis: Secondary | ICD-10-CM | POA: Diagnosis not present

## 2015-08-23 NOTE — Patient Instructions (Signed)

## 2015-08-23 NOTE — Progress Notes (Signed)
  Oncology Nurse Navigator Documentation  Navigator Location: [facility] (08/22/15 1340) Navigator Encounter Type: Clinic/MDC (08/22/15 1340)             Treatment Phase: Treatment (08/22/15 1340)       Met with Renee [name] prior to her Weekly Under Treat appointment with Dr. [name].  She was accompanied by her husband.  She reported feeling better since her hospitalization last week but feeling tired, "throat always sore". We discussed these SEs, the likelihood of increasing intensity for two weeks following last treatment but then an expected slow resolution of throat pain as inflammation begins to resolve.  We discussed how it will take months before her energy levels approach baseline, the importance of some level of exercise to maintain/build stamina for the remainder of and after treatment is completed. I provide support and encouragement, recognized how well she has tolerated treatments despite recent hospitalization.  [name], RN, BSN, [place] at [place] [phone]                          Time Spent with Patient: 30 (08/22/15 1340)

## 2015-08-23 NOTE — Progress Notes (Signed)
Patient Care Team: [name], MD as PCP - General (Internal Medicine) Elsie Stain, MD (Pulmonary Disease) Peter M Martinique, MD (Cardiology) Leota Sauers, RN as Oncology Nurse Navigator Eppie Gibson, MD as Attending Physician (Radiation Oncology)   HPI  Renee [name] is a 81 y.o. female w hx of  atrial fibrillation that dates back a number of years possibly to around the time of her bypass surgery in 2011.     Recording demonstrated episodes of very rapid atrial fibrillation with rates up to 190s. Cardizem was added to metoprolol; up titration was complicated by significant edema.  She poorly tolerated antiarrhythmic drugs. She ultimately underwent catheter ablation 4/16 and when last seen by Dr. Greggory Brandy 11/16 was doing well.  Post ablation complaints of palpitations 5/16 were evaluated with a Holter monitor demonstrates PACs and PVCs  She continues to have complaints of "atrial fibrillation"  again in the morning when she sits up. It resolves when she sits down. This is accompanied by significant lightheadedness.  On eval, she was found to be orthostatic;  This has been better with the discontinuation of her diuretics.  She is not wearing an abd binder  She is markedly limited 2/2 COPD and balance issues   She is intercurrently been diagnosed with cancer right glottis. She is undergoing radiation therapy.   Echocardiogram 5/16 demonstrated near-normal LV function. Myoview 212/15 had been normal   Chronic dyspnea has been ascribed pulmonary disease.     Past Medical History  Diagnosis Date  . IBS (irritable bowel syndrome)   . Coronary artery disease     a. CABG 09/2009. b. normal myoview in 05/2012.   [name] PAF (paroxysmal atrial fibrillation) ([place])     a. Post op after CABG. Intolerant of amio in the past. b. Recurrence - started on Tikosyn 11/2013.  [name] Dysphagia   . Hypertension   . HLD (hyperlipidemia)     can't take meds d/t joint aches/pains  . Chronic  bronchitis ([place])   . Emphysema/COPD ([place])     on home O2  . Sciatic nerve pain   . Orthostatic hypotension   . Chronic respiratory failure ([place])   . Brain aneurysm   . Internal hemorrhoids   . PONV (postoperative nausea and vomiting)   . RBBB     takes Metoporolol daily  . Chronic diastolic CHF (congestive heart failure) (HCC)     was on Lasix but has been off for a few months  . Myocardial infarction [facility]) 2009    takes Eliquis daily.  . Emphysema lung ([place])   . Pneumonia     several yrs ago  . History of vertigo     doesn't take any meds  . Arthritis   . Osteoporosis   . Chronic back pain     buldging disc  . History of colon polyps     benign  . Hypothyroidism     takes Synthroid daily  . Macular degeneration     dry  . GERD (gastroesophageal reflux disease)     takes Nexium daily  . Anxiety     takes Xanax as needed  . History of shingles     Past Surgical History  Procedure Laterality Date  . Total hip arthroplasty Right 11/01/2008  . Appendectomy  1995  . Tonsillectomy and adenoidectomy    . Joint replacement    . Coronary artery bypass graft  09/21/2009    CABG X2  . Cardiac catheterization  1990's; 06/2009  . Cholecystectomy  2008  . Larynx surgery  ~ 1960    "tumor removed"  . Total abdominal hysterectomy  1986  . Tubal ligation  1973  . Cataract extraction w/ intraocular lens  implant, bilateral Bilateral   . Retinal laser procedure Left 10/19/2013    "had a wrinkle in it"  . Tee without cardioversion N/A 05/10/2014    Procedure: TRANSESOPHAGEAL ECHOCARDIOGRAM (TEE);  Surgeon: Sueanne Margarita, MD;  Location: [facility] ENDOSCOPY;  Service: Cardiovascular;  Laterality: N/A;  . Atrial fibrillation ablation N/A 05/11/2014    Procedure: ATRIAL FIBRILLATION ABLATION;  Surgeon: Thompson Grayer, MD;  Location: [facility] CATH LAB;  Service: Cardiovascular;  Laterality: N/A;  . Colonosocpy    . Esophagogastroduodenoscopy    . Direct laryngoscopy N/A 06/03/2015    Procedure: Direct  laryngoscopy with biopsy left epiglottic lesion;  Surgeon: [name], MD;  Location: [place];  Service: ENT;  Laterality: N/A;    Current Outpatient Prescriptions  Medication Sig Dispense Refill  . acetaminophen (TYLENOL) 500 MG tablet Take 500 mg by mouth every 6 (six) hours as needed for moderate pain or headache.     . albuterol (PROAIR HFA) 108 (90 Base) MCG/ACT inhaler Inhale 2 puffs into the lungs every 6 (six) hours as needed for wheezing or shortness of breath.     . ALPRAZolam (XANAX) 0.25 MG tablet Take 0.125 mg by mouth 2 (two) times daily as needed for anxiety or sleep.     [name] apixaban (ELIQUIS) 5 MG TABS tablet Take 1 tablet (5 mg total) by mouth 2 (two) times daily. 60 tablet 1  . cholecalciferol (VITAMIN D) 1000 UNITS tablet Take 1,000 Units by mouth daily.    [name] esomeprazole (NEXIUM) 40 MG capsule Take 40 mg by mouth daily before breakfast.      . fentaNYL (DURAGESIC) 12 MCG/HR Place 1 patch (12.5 mcg total) onto the skin every 3 (three) days. 5 patch 0  . HYDROcodone-acetaminophen (HYCET) 7.5-325 mg/15 ml solution Place 15 mLs into feeding tube every 4 (four) hours as needed for moderate pain. 640 mL 0  . levothyroxine (SYNTHROID, LEVOTHROID) 25 MCG tablet Take 25 mcg by mouth daily before breakfast.     . lidocaine (XYLOCAINE) 2 % solution Mix 1 part 2%viscous lidocaine,1part H2O.Swish and/or swallow 91mL of this mixture,26min before meals and at bedtime, up to QID 100 mL 5  . metoprolol succinate (TOPROL-XL) 25 MG 24 hr tablet Take 1 tablet (25 mg total) by mouth daily.    . nitroGLYCERIN (NITROSTAT) 0.4 MG SL tablet Place 0.4 mg under the tongue every 5 (five) minutes x 3 doses as needed for chest pain.     . Nutritional Supplements (FEEDING SUPPLEMENT, OSMOLITE 1.5 CAL,) LIQD Give 1 can of Osmolite 1.5 or equivalent - 5 times daily with 60 cc free water before and after bolus feeding via PEG.  Give additional 240 cc water BID. Send formula and supplies. 1185 mL 0  . ondansetron  (ZOFRAN) 4 MG tablet Take 1 tablet (4 mg total) by mouth every 8 (eight) hours as needed for nausea or vomiting. 50 tablet 3  . OXYGEN Inhale 2 L into the lungs as directed. As needed for shortness of breath    . Polyethyl Glycol-Propyl Glycol (SYSTANE OP) Place 1 drop into both eyes 2 (two) times daily.     [name] senna (SENOKOT) 8.6 MG tablet Take 1 tablet by mouth daily as needed for constipation.     . sodium fluoride (  FLUORISHIELD) 1.1 % GEL dental gel Instill one drop of gel per tooth space of fluoride tray. Place over teeth for 5 minutes. Remove. Spit out excess. Repeat nightly. 120 mL prn  . [DISCONTINUED] budesonide-formoterol (SYMBICORT) 160-4.5 MCG/ACT inhaler Inhale 2 puffs into the lungs 2 (two) times daily. 1 Inhaler 12  . [DISCONTINUED] calcium citrate-vitamin D (CITRACAL+D) 315-200 MG-UNIT per tablet Take 1 tablet by mouth 2 (two) times daily.       No current facility-administered medications for this visit.    Allergies  Allergen Reactions  . Levofloxacin Nausea Only    Unable to tolerate more than 7 days  . Penicillins Swelling and Rash    Swelling location undefined. Has patient had a PCN reaction causing immediate rash, facial/tongue/throat swelling, SOB or lightheadedness with hypotension: Yes Has patient had a PCN reaction causing severe rash involving mucus membranes or skin necrosis: No Has patient had a PCN reaction that required hospitalization Unk Has patient had a PCN reaction occurring within the last 10 years: Unk If all of the above answers are "NO", then may proceed with Cephalosporin use.   [name] Spiriva [Tiotropium Bromide Monohydrate] Other (See Comments)    Throat irritation  . Statins Other (See Comments)    Myalgias  . Budesonide-Formoterol Fumarate Other (See Comments)    Tachycardia  . Morphine Other (See Comments)    double vision  . Phenazopyridine Hcl Other (See Comments)    *piridium* face red  . Pneumococcal Vaccines Swelling    [place] of the  skin  . Prednisone Swelling    Can't take PO but injectable is fine  . Sulfa Antibiotics Rash  . Sulfonamide Derivatives Rash    Review of Systems negative except from HPI and PMH  Physical Exam BP 128/60 mmHg  Pulse 109  Ht 5\' 6"  (1.676 m)  Wt 127 lb 3.2 oz (57.698 kg)  BMI 20.54 kg/m2  SpO2 97% Well developed and well nourished in no acute distress HENT normal E scleral and icterus clear Neck Supple JVP flat; carotids brisk and full Clear to ausculation Regular rate and rhythm S2 widely split, no murmurs gallops or rub Soft with active bowel sounds No clubbing cyanosis no  Edema Alert and oriented, grossly normal motor and sensory function Skin Warm and Dry   ECG demonstrates sinus rhythm at 109 Intervals 13/11/43 Axis 15 Right bundle branch block-incomplete Assessment and  Plan  Atrial fibrillation-paroxysmal s/p RFCA 4/16  Right bundle branch block-incomplete  Coronary disease with prior bypass  Orthostatic hypotension    COPD    She's been struggling with orthostasis in the context of her decreased oral intake and her radiation therapy. Her primary heart oncologist has suggested the use of salt supplementation; I think this is a great idea. I've also suggested she try an abdominal binder    We spent more than 50% of our >25 min visit in face to face counseling regarding the above

## 2015-08-24 ENCOUNTER — Ambulatory Visit
Admission: RE | Admit: 2015-08-24 | Discharge: 2015-08-24 | Disposition: A | Discharge status: Home / Self Care | Payer: Medicare Other | Source: Ambulatory Visit | Attending: Radiation Oncology | Admitting: Radiation Oncology

## 2015-08-24 DIAGNOSIS — C320 Malignant neoplasm of glottis: Secondary | ICD-10-CM | POA: Diagnosis not present

## 2015-08-24 DIAGNOSIS — C321 Malignant neoplasm of supraglottis: Secondary | ICD-10-CM | POA: Diagnosis not present

## 2015-08-24 DIAGNOSIS — Z87891 Personal history of nicotine dependence: Secondary | ICD-10-CM | POA: Diagnosis not present

## 2015-08-24 DIAGNOSIS — Z510 Encounter for antineoplastic radiation therapy: Secondary | ICD-10-CM | POA: Diagnosis not present

## 2015-08-25 ENCOUNTER — Ambulatory Visit
Admission: RE | Admit: 2015-08-25 | Discharge: 2015-08-25 | Disposition: A | Discharge status: Home / Self Care | Payer: Medicare Other | Source: Ambulatory Visit | Attending: Radiation Oncology | Admitting: Radiation Oncology

## 2015-08-25 ENCOUNTER — Ambulatory Visit: Payer: Medicare Other | Admitting: Nutrition

## 2015-08-25 DIAGNOSIS — C320 Malignant neoplasm of glottis: Secondary | ICD-10-CM | POA: Diagnosis not present

## 2015-08-25 DIAGNOSIS — C321 Malignant neoplasm of supraglottis: Secondary | ICD-10-CM | POA: Diagnosis not present

## 2015-08-25 DIAGNOSIS — Z87891 Personal history of nicotine dependence: Secondary | ICD-10-CM | POA: Diagnosis not present

## 2015-08-25 DIAGNOSIS — Z510 Encounter for antineoplastic radiation therapy: Secondary | ICD-10-CM | POA: Diagnosis not present

## 2015-08-25 NOTE — Progress Notes (Signed)
Nutrition follow-up completed with patient and husband after radiation therapy for cancer of the epiglottis. Weight is stable and was documented as 127.6 pounds July 20 Patient only tolerating water and coffee by mouth. Patient gave 4 and 1/2 cans of Osmolite 1.5 yesterday via feeding tube with 60 cc of water before and after bolus feeding. Patient feels like she is doing well.  Estimated Nutrition Needs: 1700-2000 calories, 70-85 grams protein, 2 L fluid  Nutrition Diagnosis: Inadequate oral intake continues.  Intervention: Educated patient to continue increasing Osmolite 1.5 to one can 5 times daily every 3 hours with 60 cc free water before and after This will provide 1775 cal, 74.5 g protein, and 905 mL fluid Patient will continue to give 60 cc free water before and after feedings and continue to consume at least 480 cc by mouth. Questions were answered.  Teach back method used.  Monitoring, evaluation, goals: Patient will work to continue to increase tube feeding to goal rate and achieve weight maintenance.  Next visit: Thursday, July 27, after radiation therapy.  **Disclaimer: This note was dictated with voice recognition software. Similar sounding words can inadvertently be transcribed and this note may contain transcription errors which may not have been corrected upon publication of note.**

## 2015-08-26 ENCOUNTER — Ambulatory Visit
Admission: RE | Admit: 2015-08-26 | Discharge: 2015-08-26 | Disposition: A | Discharge status: Home / Self Care | Payer: Medicare Other | Source: Ambulatory Visit | Attending: Radiation Oncology | Admitting: Radiation Oncology

## 2015-08-26 ENCOUNTER — Encounter: Payer: Self-pay | Admitting: Adult Health

## 2015-08-26 ENCOUNTER — Ambulatory Visit (HOSPITAL_BASED_OUTPATIENT_CLINIC_OR_DEPARTMENT_OTHER): Payer: Medicare Other | Admitting: Adult Health

## 2015-08-26 VITALS — BP 78/55 | HR 106 | Temp 97.7°F | Resp 16 | Wt 126.8 lb

## 2015-08-26 DIAGNOSIS — I951 Orthostatic hypotension: Secondary | ICD-10-CM | POA: Diagnosis not present

## 2015-08-26 DIAGNOSIS — Z87891 Personal history of nicotine dependence: Secondary | ICD-10-CM | POA: Diagnosis not present

## 2015-08-26 DIAGNOSIS — L308 Other specified dermatitis: Secondary | ICD-10-CM

## 2015-08-26 DIAGNOSIS — C321 Malignant neoplasm of supraglottis: Secondary | ICD-10-CM

## 2015-08-26 DIAGNOSIS — C320 Malignant neoplasm of glottis: Secondary | ICD-10-CM | POA: Diagnosis not present

## 2015-08-26 DIAGNOSIS — E46 Unspecified protein-calorie malnutrition: Secondary | ICD-10-CM

## 2015-08-26 DIAGNOSIS — Z510 Encounter for antineoplastic radiation therapy: Secondary | ICD-10-CM | POA: Diagnosis not present

## 2015-08-26 NOTE — Progress Notes (Signed)
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 Direct visualization & possible biopsy recommended. No adenopathy.    05/20/2015 Imaging CT neck: Nodularity of (L) epiglottis could represent a mass of polyp. No adenopathy.    06/03/2015 Initial Biopsy Direct laryngoscopy Renee [name]). Epiglottis biopsy: Squamous cell carcinoma.  (R) lateral wall larynx biopsy: Squamous cell carcinoma, moderately to poorly differentiated. p16 strongly (+) on both specimens.    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 3 sub-cm hypermetabolic (R) cervical LNs consistent with nodal  mets. 2 level 2 and 1 level 3 LN. No distant mets.    07/19/2015 -  Radiation Therapy Currently undergoing IMRT Renee [name]).      INTERVAL HISTORY:  Renee [name] presents for evaluation today as requested by Dr. Isidore [name], the patient's radiation oncologist.  She has completed fraction #26/35 as of today.    She has a hoarse voice and wonders why it is getting worse.  She has questions about her skin as it relates to her radiation treatments.  She denies any dizziness, either seated or with standing.  She tells me that she was told to start salt tablets and get an abdominal binder to help increase her blood pressure.   She largely feels the same as she did on Monday at her routine PUT visit with Dr. Isidore [name], with the exception of hoarse voice that is worse to her.   ADDITIONAL REVIEW OF SYSTEMS:  Review of Systems  Constitutional: Positive for malaise/fatigue.       (+) fatigue  HENT: Positive for sore throat.        She is working on her pain management; current regimen is adequate  Respiratory: Positive for cough.   Gastrointestinal: Negative for nausea, vomiting and diarrhea.       Bowels moving with  Senokot  Genitourinary: Negative for dysuria.  Skin: Positive for rash.       Neck red, peeling.   Neurological: Positive for speech change. Negative for dizziness.  Psychiatric/Behavioral: Negative for depression. The patient is not nervous/anxious.      CURRENT MEDICATIONS:  Current Outpatient Prescriptions on File Prior to Visit  Medication Sig Dispense Refill  . acetaminophen (TYLENOL) 500 MG tablet Take 500 mg by mouth every 6 (six) hours as needed for moderate pain or headache.     . albuterol (PROAIR HFA) 108 (90 Base) MCG/ACT inhaler Inhale 2 puffs into the lungs every 6 (six) hours as needed for wheezing or shortness of breath.     . ALPRAZolam (XANAX) 0.25 MG tablet Take 0.125 mg by mouth 2 (two) times daily as needed for anxiety or sleep.     [name] apixaban (ELIQUIS) 5 MG TABS tablet Take 1 tablet (5 mg total) by mouth 2 (two) times daily. 60 tablet 1  . cholecalciferol (VITAMIN D) 1000 UNITS tablet Take 1,000 Units by mouth daily.    [name] esomeprazole (NEXIUM) 40 MG capsule Take 40 mg by mouth daily before breakfast.      . fentaNYL (DURAGESIC) 12 MCG/HR Place 1 patch (12.5 mcg total) onto the skin every 3 (three) days. 5 patch 0  . HYDROcodone-acetaminophen (HYCET) 7.5-325 mg/15 ml solution Place 15 mLs into feeding tube every 4 (four) hours as needed for moderate pain. 640 mL 0  . levothyroxine (SYNTHROID, LEVOTHROID) 25  MCG tablet Take 25 mcg by mouth daily before breakfast.     . lidocaine (XYLOCAINE) 2 % solution Mix 1 part 2%viscous lidocaine,1part H2O.Swish and/or swallow 33mL of this mixture,94min before meals and at bedtime, up to QID 100 mL 5  . metoprolol succinate (TOPROL-XL) 25 MG 24 hr tablet Take 1 tablet (25 mg total) by mouth daily.    . nitroGLYCERIN (NITROSTAT) 0.4 MG SL tablet Place 0.4 mg under the tongue every 5 (five) minutes x 3 doses as needed for chest pain.     . Nutritional Supplements (FEEDING SUPPLEMENT, OSMOLITE 1.5 CAL,) LIQD Give 1 can of Osmolite 1.5 or  equivalent - 5 times daily with 60 cc free water before and after bolus feeding via PEG.  Give additional 240 cc water BID. Send formula and supplies. 1185 mL 0  . ondansetron (ZOFRAN) 4 MG tablet Take 1 tablet (4 mg total) by mouth every 8 (eight) hours as needed for nausea or vomiting. 50 tablet 3  . OXYGEN Inhale 2 L into the lungs as directed. As needed for shortness of breath    . Polyethyl Glycol-Propyl Glycol (SYSTANE OP) Place 1 drop into both eyes 2 (two) times daily.     [name] senna (SENOKOT) 8.6 MG tablet Take 1 tablet by mouth daily as needed for constipation.     . sodium fluoride (FLUORISHIELD) 1.1 % GEL dental gel Instill one drop of gel per tooth space of fluoride tray. Place over teeth for 5 minutes. Remove. Spit out excess. Repeat nightly. 120 mL prn  . [DISCONTINUED] budesonide-formoterol (SYMBICORT) 160-4.5 MCG/ACT inhaler Inhale 2 puffs into the lungs 2 (two) times daily. 1 Inhaler 12  . [DISCONTINUED] calcium citrate-vitamin D (CITRACAL+D) 315-200 MG-UNIT per tablet Take 1 tablet by mouth 2 (two) times daily.       No current facility-administered medications on file prior to visit.    ALLERGIES:  Allergies  Allergen Reactions  . Levofloxacin Nausea Only    Unable to tolerate more than 7 days  . Penicillins Swelling and Rash    Swelling location undefined. Has patient had a PCN reaction causing immediate rash, facial/tongue/throat swelling, SOB or lightheadedness with hypotension: Yes Has patient had a PCN reaction causing severe rash involving mucus membranes or skin necrosis: No Has patient had a PCN reaction that required hospitalization Unk Has patient had a PCN reaction occurring within the last 10 years: Unk If all of the above answers are "NO", then may proceed with Cephalosporin use.   [name] Spiriva [Tiotropium Bromide Monohydrate] Other (See Comments)    Throat irritation  . Statins Other (See Comments)    Myalgias  . Budesonide-Formoterol Fumarate Other (See  Comments)    Tachycardia  . Morphine Other (See Comments)    double vision  . Phenazopyridine Hcl Other (See Comments)    *piridium* face red  . Pneumococcal Vaccines Swelling    Redding of the skin  . Prednisone Swelling    Can't take PO but injectable is fine  . Sulfa Antibiotics Rash  . Sulfonamide Derivatives Rash     PHYSICAL EXAM:  Vitals: Filed Vitals:   08/26/15 1400 08/26/15 1405  BP: 122/70 78/55  Pulse: 90 106  Temp: 97.7 F (36.5 C)   Resp: 16    *Orthostatic VS as above  Weight Date  126 lb 12.8 oz (57.516 kg) 08/26/15  126 lb 14.4 oz (57.561 kg) 08/22/15  127 lb 12.8 oz (57.97 kg) 08/10/15  129 lb 4.8 oz (58.65 kg) 08/05/15  132 lb 1.6 oz (59.92 kg) 08/01/15  128 lb 1.6 oz (58.106 kg) 07/25/15  129 lb 1.6 oz (58.559 kg) Pre-treatment: 06/22/15   General: Female in no acute distress. Accompanied by her husband today.  Seen seated in wheelchair. HEENT: Head normocephalic.  Pupils equal and reactive to light. Conjunctivae clear without exudate.  Sclerae anicteric. Oral mucosa is pink; dentures in place. Tongue is moist and without thrush. Posterior oropharynx is mildly erythematous.   Neck: Skin on neck is red, peeling, and hyperpigmented.    Cardiovascular: Tachycardic, but regular rhythm.  Respiratory: Bilat upper lobes with slight rhonchi. Patient coughing intermittently.  Bases diminished.  Breathing non-labored. GI: Abdomen soft and round. Bowel sounds normoactive. G-tube in place.  GU: Deferred.   Neuro: No focal deficits.  Psych: Normal mood and affect for situation. Extremities: No edema. Skin: Warm and dry.    LABORATORY DATA:  None for this visit  DIAGNOSTIC IMAGING:  None at this visit.    ASSESSMENT & PLAN:  Renee [name] is a pleasant 81 y.o. 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  Completed fraction #26/35 today, 08/26/15.  She presents to clinic today for routine follow-up as  requested by Dr. Isidore [name].   1. Cancer of the epiglottis:  Renee [name] is tolerating radiation as expected and should continue her planned radiation treatments, as prescribed by Dr. Isidore [name].  She completed fraction 26/35 today. I explained that her voice changes are likely related to vocal cord edema/inflammation related to her radiation therapy.   2. Orthostatic hypotension: While she does have orthostatic hypotension in clinic, she remains asymptomatic.  Her vitals are largely unchanged from her evaluation with Dr. Isidore [name] on Monday, 08/22/15.  We discussed potentially giving IV fluids today. However, she denies any dizziness or syncopal episodes.  She states, "I really do feel okay." I encouraged her to take the salt tablets as previously directed.  I also encouraged her to put at least one bottle of Gatorade or other sports drink in her G-tube every day and flush well with water. This will help provide her with additional electrolyte and fluid support to increase her blood pressure.   I gave her strict safety precautions for the weekend that if she becomes weaker, dizzy, pre-syncopal/syncopal, or feels worse then she should go to an urgent care or her closest ED for further evaluation.  I also gave her the [phone] to call over the weekend should she need to speak with an oncology physician on-call.  She voiced understanding and agreed with this plan.     3. Radiation dermatitis:  She has dry desquamation and erythema.  There is no moist desquamation. Encouraged her to continue to use the Sonafine cream as previously prescribed.  There are very small areas of skin that have peeled and are more tender; encouraged her to apply Neosporin to these areas.    4. Protein-calorie malnutrition, stable: She is tolerating her tube feedings well. Her weight is stable, which is encouraging. She will follow-up with Renee [name], RD as directed.      Dispo:   -Continue planned radiation treatments.  -Follow-up with  Dr. Isidore [name] for routine PUT visit next week.    A total of 20 minutes was spent in face-to-face care of this patient with greater than 50% of that time spent in counseling & care-coordination.   Mike Craze, NP [place] [phone]

## 2015-08-29 ENCOUNTER — Ambulatory Visit
Admission: RE | Admit: 2015-08-29 | Discharge: 2015-08-29 | Disposition: A | Discharge status: Home / Self Care | Payer: Medicare Other | Source: Ambulatory Visit | Attending: Radiation Oncology | Admitting: Radiation Oncology

## 2015-08-29 ENCOUNTER — Encounter: Payer: Self-pay | Admitting: Radiation Oncology

## 2015-08-29 ENCOUNTER — Other Ambulatory Visit: Payer: Self-pay | Admitting: Radiation Oncology

## 2015-08-29 VITALS — BP 87/54 | HR 97 | Temp 97.7°F | Ht 66.0 in | Wt 129.5 lb

## 2015-08-29 DIAGNOSIS — C321 Malignant neoplasm of supraglottis: Secondary | ICD-10-CM | POA: Diagnosis not present

## 2015-08-29 DIAGNOSIS — C320 Malignant neoplasm of glottis: Secondary | ICD-10-CM | POA: Diagnosis not present

## 2015-08-29 DIAGNOSIS — Z87891 Personal history of nicotine dependence: Secondary | ICD-10-CM | POA: Diagnosis not present

## 2015-08-29 DIAGNOSIS — Z510 Encounter for antineoplastic radiation therapy: Secondary | ICD-10-CM | POA: Diagnosis not present

## 2015-08-29 LAB — BASIC METABOLIC PANEL
ANION GAP: 9 meq/L (ref 3–11)
BUN: 18.9 mg/dL (ref 7.0–26.0)
CALCIUM: 9.9 mg/dL (ref 8.4–10.4)
CO2: 31 meq/L — AB (ref 22–29)
Chloride: 99 mEq/L (ref 98–109)
Creatinine: 0.8 mg/dL (ref 0.6–1.1)
EGFR: 68 mL/min/{1.73_m2} — AB (ref >90)
Glucose: 99 mg/dl (ref 70–140)
Potassium: 4.6 mEq/L (ref 3.5–5.1)
SODIUM: 139 meq/L (ref 136–145)

## 2015-08-29 MED ORDER — HYDROCODONE-ACETAMINOPHEN 7.5-325 MG/15ML PO SOLN: 15.0000 mL | ORAL | 0 refills | Status: DC | PRN

## 2015-08-29 NOTE — Progress Notes (Signed)
   Weekly Management Note:  Outpatient    ICD-9-CM ICD-10-CM   1. Carcinoma of epiglottis ([facility]) 161.1 [id number] Basic metabolic panel     HYDROcodone-acetaminophen (HYCET) 7.5-325 mg/15 ml solution    Current Dose:54 Gy  Projected Dose: 70 Gy   Narrative:  The patient presents for routine under treatment assessment.  CBCT/MVCT images/Port film x-rays were reviewed.  The chart was checked.  Ms. Nipple feels more hoarse. Pain severe at night.  Takes in 1500-2000 H20 daily with osmolite.  She saw Dr Caryl Comes for her blood pressure recently.   Physical Findings:  Wt Readings from Last 3 Encounters:  08/29/15 129 lb 8 oz (58.7 kg)  08/26/15 126 lb 12.8 oz (57.5 kg)  08/25/15 127 lb 9.6 oz (57.9 kg)    height is 5\' 6"  (1.676 m) and weight is 129 lb 8 oz (58.7 kg). Her temperature is 97.7 F (36.5 C). Her blood pressure is 87/54 (abnormal) and her pulse is 97. Her oxygen saturation is 100%.     Orthostatics noted.    Erythema with minimal mucositis in her oral pharynx. Thick saliva. Neck has bright erythema with dry peeling  CBC    Component Value Date/Time   WBC 6.0 08/22/2015 1340   WBC 3.6 (L) 08/16/2015 0433   RBC 4.57 08/22/2015 1340   RBC 3.92 08/16/2015 0433   HGB 12.3 08/22/2015 1340   HCT 39.0 08/22/2015 1340   PLT 406 (H) 08/22/2015 1340   MCV 85.3 08/22/2015 1340   MCH 27.0 08/22/2015 1340   MCH 26.5 08/16/2015 0433   MCHC 31.6 08/22/2015 1340   MCHC 30.1 08/16/2015 0433   RDW 18.5 (H) 08/22/2015 1340   LYMPHSABS 0.5 (L) 08/22/2015 1340   MONOABS 0.6 08/22/2015 1340   EOSABS 0.1 08/22/2015 1340   BASOSABS 0.1 08/22/2015 1340    CMP     Component Value Date/Time   NA 138 08/22/2015 1339   K 5.5 (H) 08/22/2015 1339   CL 106 08/16/2015 0433   CO2 29 08/22/2015 1339   GLUCOSE 107 08/22/2015 1339   BUN 16.3 08/22/2015 1339   CREATININE 0.9 08/22/2015 1339   CALCIUM 10.0 08/22/2015 1339   PROT 7.9 08/15/2015 0941   ALBUMIN 3.3 (L) 08/15/2015 0941   AST 23  08/15/2015 0941   ALT <9 08/15/2015 0941   ALKPHOS 142 08/15/2015 0941   BILITOT 0.85 08/15/2015 0941   GFRNONAA >60 08/16/2015 0433   GFRAA >60 08/16/2015 0433    Impression:  The patient is tolerating radiotherapy.   Plan:  Continue radiotherapy as planned. Check BMP before next treatment.  Orthostasis continues but she is taking in a lot of fluid and not dizzy.  Her opiods could be contributing.    Due to 8/10 pain at night, may take 32mL of Hycet at that time. She declines escalation of the fentanyl dose.  Hydrogel pads and sonafine, neosporin for skin. Baking soda gargles for thick saliva, mucositis.  -----------------------------------  Eppie Gibson, MD

## 2015-08-29 NOTE — Progress Notes (Signed)
Renee [name] is here for her 27th fraction of radiation to her Larynx. She reports pain a 8/10 in her throat. She is using a fentanyl patch, hycet 7/5/325 mg (31ml) about every 4 hours. She is also using the carafate to soothe her throat, which she feels helps quite a bit. She is not taking anything orally except sips of water throughout the day. She is instilling 4 cans of osmolite and between 1500 and 2000 ml of water daily through her feeding tube. The skin to her neck and upper middle back is red, inflammed, tender, and with dry skin noted. She is using neosporin, hydrocortisone, and sonafine these areas. She does report some constipation, despite using one senna at bedtime. She may try to take another sennakot to improve her constipation. She saw Dr. Cleda Mccreedy regarding her low orthostatic blood pressure last week. He recommended a binder in addition to adding salt through her feeding tube. She reports a cough at night with thick sputum production.   Patient Vitals for the past 24 hrs:  BP Temp Pulse SpO2 Height Weight  08/29/15 1401 (!) 87/54 - 97 - - -  08/29/15 1356 123/72 97.7 F (36.5 C) 100 100 % 5\' 6"  (1.676 m) 129 lb 8 oz (58.7 kg)   Orthostatics: BP sitting 123/72 pulse 100. BP standing 87/54 pulse 97  Wt Readings from Last 3 Encounters:  08/29/15 129 lb 8 oz (58.7 kg)  08/26/15 126 lb 12.8 oz (57.5 kg)  08/25/15 127 lb 9.6 oz (57.9 kg)

## 2015-08-29 NOTE — Addendum Note (Signed)
Encounter addended by: Eppie Gibson, MD on: 08/29/2015  4:35 PM<BR>    Actions taken: Visit Navigator Flowsheet section accepted, Order Entry activity accessed, Diagnosis association updated

## 2015-08-30 ENCOUNTER — Ambulatory Visit (HOSPITAL_BASED_OUTPATIENT_CLINIC_OR_DEPARTMENT_OTHER): Payer: Medicare Other

## 2015-08-30 ENCOUNTER — Other Ambulatory Visit: Payer: Self-pay | Admitting: Radiation Oncology

## 2015-08-30 ENCOUNTER — Telehealth: Payer: Self-pay | Admitting: *Deleted

## 2015-08-30 ENCOUNTER — Ambulatory Visit
Admission: RE | Admit: 2015-08-30 | Discharge: 2015-08-30 | Disposition: A | Discharge status: Home / Self Care | Payer: Medicare Other | Source: Ambulatory Visit | Attending: Radiation Oncology | Admitting: Radiation Oncology

## 2015-08-30 ENCOUNTER — Other Ambulatory Visit: Payer: Self-pay

## 2015-08-30 VITALS — BP 125/62 | HR 92 | Temp 98.2°F | Resp 16

## 2015-08-30 DIAGNOSIS — Z510 Encounter for antineoplastic radiation therapy: Secondary | ICD-10-CM | POA: Diagnosis not present

## 2015-08-30 DIAGNOSIS — C320 Malignant neoplasm of glottis: Secondary | ICD-10-CM | POA: Diagnosis not present

## 2015-08-30 DIAGNOSIS — Z87891 Personal history of nicotine dependence: Secondary | ICD-10-CM | POA: Diagnosis not present

## 2015-08-30 DIAGNOSIS — C321 Malignant neoplasm of supraglottis: Secondary | ICD-10-CM

## 2015-08-30 MED ORDER — SODIUM CHLORIDE 0.9 % IV SOLN
8.0000 mg | Freq: Once | INTRAVENOUS | Status: DC
  Filled 2015-08-30: qty 4

## 2015-08-30 MED ORDER — SODIUM CHLORIDE 0.9 % IV SOLN
Freq: Once | INTRAVENOUS | Status: AC
  Administered 2015-08-30: 13:00:00 via INTRAVENOUS

## 2015-08-30 NOTE — Patient Instructions (Signed)

## 2015-08-30 NOTE — Progress Notes (Signed)
Pt taken to radiology by transport tech in wheelchair with spouse and purse and IVF. Will return after treatment .

## 2015-08-30 NOTE — Telephone Encounter (Signed)
Per Anderson Malta from radiation I have scheduled appts for IVF. She will contact the patient

## 2015-08-30 NOTE — Progress Notes (Signed)
Pt returned from radiology.

## 2015-08-31 ENCOUNTER — Ambulatory Visit
Admission: RE | Admit: 2015-08-31 | Discharge: 2015-08-31 | Disposition: A | Discharge status: Home / Self Care | Payer: Medicare Other | Source: Ambulatory Visit | Attending: Radiation Oncology | Admitting: Radiation Oncology

## 2015-08-31 DIAGNOSIS — C320 Malignant neoplasm of glottis: Secondary | ICD-10-CM | POA: Diagnosis not present

## 2015-08-31 DIAGNOSIS — Z510 Encounter for antineoplastic radiation therapy: Secondary | ICD-10-CM | POA: Diagnosis not present

## 2015-08-31 DIAGNOSIS — C321 Malignant neoplasm of supraglottis: Secondary | ICD-10-CM | POA: Diagnosis not present

## 2015-08-31 DIAGNOSIS — Z87891 Personal history of nicotine dependence: Secondary | ICD-10-CM | POA: Diagnosis not present

## 2015-09-01 ENCOUNTER — Ambulatory Visit: Payer: Medicare Other | Admitting: Nutrition

## 2015-09-01 ENCOUNTER — Ambulatory Visit
Admission: RE | Admit: 2015-09-01 | Discharge: 2015-09-01 | Disposition: A | Discharge status: Home / Self Care | Payer: Medicare Other | Source: Ambulatory Visit | Attending: Radiation Oncology | Admitting: Radiation Oncology

## 2015-09-01 ENCOUNTER — Telehealth: Payer: Self-pay | Admitting: *Deleted

## 2015-09-01 DIAGNOSIS — Z87891 Personal history of nicotine dependence: Secondary | ICD-10-CM | POA: Diagnosis not present

## 2015-09-01 DIAGNOSIS — C321 Malignant neoplasm of supraglottis: Secondary | ICD-10-CM | POA: Diagnosis not present

## 2015-09-01 DIAGNOSIS — C320 Malignant neoplasm of glottis: Secondary | ICD-10-CM | POA: Diagnosis not present

## 2015-09-01 DIAGNOSIS — Z510 Encounter for antineoplastic radiation therapy: Secondary | ICD-10-CM | POA: Diagnosis not present

## 2015-09-01 NOTE — Progress Notes (Signed)
Nutrition follow-up completed with patient after radiation therapy for cancer of the epiglottis. Weight improved documented as 129.5 pounds on July 24 increased from 127.6 pounds July 20. Patient is drinking some water by mouth. Has decreased Osmolite 1.5 to one can 4 times a day because she has been giving herself more water. Patient has no complaints.  Estimated nutrition needs: 1700-2000 calories, 70-85 grams protein, 2 L fluid.  Nutrition diagnosis: Inadequate oral intake continues.  Intervention: Enforced importance of increasing Osmolite 1.5 to one can 5 times daily every 3 hours with 60 cc free water before and after bolus feeding. Patient encouraged to drink 16 ounces of water by mouth. This provides 1775 cal, 74.5 g protein, and 1985 mL free water. IV fluids as needed.  Per M.D.  Monitoring, evaluation, goals: Patient will work to increase tube feeding to goal rate to provide adequate nutrition for healing.  Next visit: Tuesday, August 1.  After radiation therapy.  **Disclaimer: This note was dictated with voice recognition software. Similar sounding words can inadvertently be transcribed and this note may contain transcription errors which may not have been corrected upon publication of note.**

## 2015-09-01 NOTE — Telephone Encounter (Signed)
  Oncology Nurse Navigator Documentation  Ms. Lunn's daughter Kyra Manges called to inform that she was "too tired" to come in for RT today b/c she was up last HS with Mr. Grotheer who isn't feeling well, did not want to leave him by himself.  I encouraged Kyra Manges to have her mother come in for RT and stressed importance of keeping appt with Nutritionist as well.  She called back to inform that arrangements had been made for someone to stay with Mr. Dreessen, that Emiliah will be coming in for RT and appt with Barb.  [name], RN, BSN, [facility] [phone]   Navigator Location: [place] (09/01/15 0956) Navigator Encounter Type: Telephone (09/01/15 [id number]) Telephone: Incoming Call (09/01/15 [id number])                                        Time Spent with Patient: 15 (09/01/15 [id number])

## 2015-09-02 ENCOUNTER — Ambulatory Visit (HOSPITAL_BASED_OUTPATIENT_CLINIC_OR_DEPARTMENT_OTHER): Payer: Medicare Other

## 2015-09-02 ENCOUNTER — Other Ambulatory Visit: Payer: Self-pay | Admitting: Radiation Oncology

## 2015-09-02 ENCOUNTER — Ambulatory Visit
Admission: RE | Admit: 2015-09-02 | Discharge: 2015-09-02 | Disposition: A | Discharge status: Home / Self Care | Payer: Medicare Other | Source: Ambulatory Visit | Attending: Radiation Oncology | Admitting: Radiation Oncology

## 2015-09-02 VITALS — BP 140/53 | HR 81 | Temp 97.9°F | Resp 18

## 2015-09-02 DIAGNOSIS — C321 Malignant neoplasm of supraglottis: Secondary | ICD-10-CM

## 2015-09-02 DIAGNOSIS — Z510 Encounter for antineoplastic radiation therapy: Secondary | ICD-10-CM | POA: Diagnosis not present

## 2015-09-02 DIAGNOSIS — Z87891 Personal history of nicotine dependence: Secondary | ICD-10-CM | POA: Diagnosis not present

## 2015-09-02 DIAGNOSIS — C320 Malignant neoplasm of glottis: Secondary | ICD-10-CM | POA: Diagnosis not present

## 2015-09-02 MED ORDER — SODIUM CHLORIDE 0.9 % IV SOLN
Freq: Once | INTRAVENOUS | Status: AC
  Administered 2015-09-02: 14:00:00 via INTRAVENOUS

## 2015-09-02 NOTE — Patient Instructions (Signed)

## 2015-09-02 NOTE — Progress Notes (Signed)
Pt reports nausea controlled with home nausea medications.  States she does not need nausea meds at this time.  Reports pain to throat 6/10, but does not want any pain meds at this time.  Pt aware to let this RN know if that changes and she needs something while she is here.    1335: Pt. Taken to radiation via wheelchair by tech  1425: Pt returned from radiation via wheelchair escorted by husband and tech.  In NAD, no needs identified at this time.

## 2015-09-05 ENCOUNTER — Encounter: Payer: Self-pay | Admitting: Radiation Oncology

## 2015-09-05 ENCOUNTER — Ambulatory Visit
Admission: RE | Admit: 2015-09-05 | Discharge: 2015-09-05 | Disposition: A | Discharge status: Home / Self Care | Payer: Medicare Other | Source: Ambulatory Visit | Attending: Radiation Oncology | Admitting: Radiation Oncology

## 2015-09-05 VITALS — Temp 97.9°F | Ht 66.0 in | Wt 131.5 lb

## 2015-09-05 DIAGNOSIS — C321 Malignant neoplasm of supraglottis: Secondary | ICD-10-CM

## 2015-09-05 DIAGNOSIS — C320 Malignant neoplasm of glottis: Secondary | ICD-10-CM | POA: Diagnosis not present

## 2015-09-05 DIAGNOSIS — Z510 Encounter for antineoplastic radiation therapy: Secondary | ICD-10-CM | POA: Diagnosis not present

## 2015-09-05 DIAGNOSIS — Z87891 Personal history of nicotine dependence: Secondary | ICD-10-CM | POA: Diagnosis not present

## 2015-09-05 LAB — BASIC METABOLIC PANEL
Anion Gap: 8 mEq/L (ref 3–11)
BUN: 14.8 mg/dL (ref 7.0–26.0)
CHLORIDE: 101 meq/L (ref 98–109)
CO2: 30 meq/L — AB (ref 22–29)
Calcium: 9.5 mg/dL (ref 8.4–10.4)
Creatinine: 0.8 mg/dL (ref 0.6–1.1)
EGFR: 72 mL/min/{1.73_m2} — AB (ref >90)
GLUCOSE: 100 mg/dL (ref 70–140)
Potassium: 4.5 mEq/L (ref 3.5–5.1)
SODIUM: 139 meq/L (ref 136–145)

## 2015-09-05 MED ORDER — FENTANYL 25 MCG/HR TD PT72: 25.0000 ug | MEDICATED_PATCH | TRANSDERMAL | 0 refills | Status: DC

## 2015-09-05 MED ORDER — SONAFINE EX EMUL
1.0000 "application " | Freq: Once | CUTANEOUS | Status: AC
  Administered 2015-09-05: 1 via TOPICAL

## 2015-09-05 NOTE — Progress Notes (Addendum)
Weekly Management Note:  Outpatient    ICD-9-CM ICD-10-CM   1. Carcinoma of epiglottis ([facility]) 161.1 C32.1 fentaNYL (DURAGESIC - DOSED MCG/HR) 25 MCG/HR patch     SONAFINE emulsion 1 application    Current Dose: 64 Gy  Projected Dose: 70 Gy   Narrative:  The patient presents for routine under treatment assessment.  CBCT/MVCT images/Port film x-rays were reviewed.  The chart was checked.   Renee [name] presents for her 32nd fraction of radiation to her Larynx. She reports throat pain a 5/10. She has run out of her Fentanyl patches (because one was broken in the package) but asked for an increased dosage today. She is using her breakthrough pain medicine every 4-6 hours with some relief. She is using neosporin, hydrocortsone, and sonafine to skin, and was provided with another sonafine today. She is only drinking water orally and instilling 1 can of osmolite 4 times daily, but cannot do more because of feeling too full. She is instilling about 1500 cc of water daily. She has thick saliva present, with some blood in the sputum.  Temp 97.9 F (36.6 C)   Ht 5\' 6"  (1.676 m)   Wt 131 lb 8 oz (59.6 kg)   BMI 21.22 kg/m    Orthostatics: BP sitting 133/54, pulse 94, BP standing 112/62 pulse 102.  slightly orthostatic but labs are satisfactory and weight is up.     Physical Findings:  Wt Readings from Last 3 Encounters:  09/05/15 131 lb 8 oz (59.6 kg)  08/29/15 129 lb 8 oz (58.7 kg)  08/26/15 126 lb 12.8 oz (57.5 kg)    height is 5\' 6"  (1.676 m) and weight is 131 lb 8 oz (59.6 kg). Her temperature is 97.9 F (36.6 C).     Orthostatics noted.    Erythema with  mucositis in her oropharynx. No thrush. Thick saliva. Neck has bright erythema with dry peeling.  CBC    Component Value Date/Time   WBC 6.0 08/22/2015 1340   WBC 3.6 (L) 08/16/2015 0433   RBC 4.57 08/22/2015 1340   RBC 3.92 08/16/2015 0433   HGB 12.3 08/22/2015 1340   HCT 39.0 08/22/2015 1340   PLT 406 (H) 08/22/2015 1340     MCV 85.3 08/22/2015 1340   MCH 27.0 08/22/2015 1340   MCH 26.5 08/16/2015 0433   MCHC 31.6 08/22/2015 1340   MCHC 30.1 08/16/2015 0433   RDW 18.5 (H) 08/22/2015 1340   LYMPHSABS 0.5 (L) 08/22/2015 1340   MONOABS 0.6 08/22/2015 1340   EOSABS 0.1 08/22/2015 1340   BASOSABS 0.1 08/22/2015 1340    CMP     Component Value Date/Time   NA 139 09/05/2015 1249   K 4.5 09/05/2015 1249   CL 106 08/16/2015 0433   CO2 30 (H) 09/05/2015 1249   GLUCOSE 100 09/05/2015 1249   BUN 14.8 09/05/2015 1249   CREATININE 0.8 09/05/2015 1249   CALCIUM 9.5 09/05/2015 1249   PROT 7.9 08/15/2015 0941   ALBUMIN 3.3 (L) 08/15/2015 0941   AST 23 08/15/2015 0941   ALT <9 08/15/2015 0941   ALKPHOS 142 08/15/2015 0941   BILITOT 0.85 08/15/2015 0941   GFRNONAA >60 08/16/2015 0433   GFRAA >60 08/16/2015 0433    Impression:  The patient is tolerating radiotherapy.   Plan:  Continue radiotherapy as planned.   Increased duragesic to 17mcg today.  Weight is up - continue PO water and tube feeds w/ add'l water.  F/u in 2 wk, sooner prn. -----------------------------------  Eppie Gibson, MD

## 2015-09-05 NOTE — Progress Notes (Signed)
Renee [name] presents for her 32nd fraction of radiation to her Larynx. She reports throat pain a 5/10. She has run out of her Fentanyl patches (because one was broken in the package) but asked for an increased dosage today. She is using her breakthrough pain medicine every 4-6 hours with some relief. Her skin is intact with redness. There is some peeling noted. She is using neosporin, hydrocortsone, and sonafine to these areas, and was provided with another sonafine today. She is only drinking water orally and instilling 1 can of osmolite 4 times daily, but cannot do more because of feeling too full. She is instilling about 1500 cc of water daily. She has thick saliva present, with some blood in the sputum.  Temp 97.9 F (36.6 C)   Ht 5\' 6"  (1.676 m)   Wt 131 lb 8 oz (59.6 kg)   BMI 21.22 kg/m    Orthostatics: BP sitting 133/54, pulse 94, BP standing 112/62 pulse 102.  Wt Readings from Last 3 Encounters:  09/05/15 131 lb 8 oz (59.6 kg)  08/29/15 129 lb 8 oz (58.7 kg)  08/26/15 126 lb 12.8 oz (57.5 kg)

## 2015-09-06 ENCOUNTER — Ambulatory Visit
Admission: RE | Admit: 2015-09-06 | Discharge: 2015-09-06 | Disposition: A | Discharge status: Home / Self Care | Payer: Medicare Other | Source: Ambulatory Visit | Attending: Radiation Oncology | Admitting: Radiation Oncology

## 2015-09-06 ENCOUNTER — Ambulatory Visit: Payer: Medicare Other | Admitting: Nutrition

## 2015-09-06 ENCOUNTER — Ambulatory Visit: Payer: Medicare Other

## 2015-09-06 DIAGNOSIS — Z87891 Personal history of nicotine dependence: Secondary | ICD-10-CM | POA: Diagnosis not present

## 2015-09-06 DIAGNOSIS — C320 Malignant neoplasm of glottis: Secondary | ICD-10-CM | POA: Diagnosis not present

## 2015-09-06 DIAGNOSIS — C321 Malignant neoplasm of supraglottis: Secondary | ICD-10-CM | POA: Diagnosis not present

## 2015-09-06 DIAGNOSIS — Z510 Encounter for antineoplastic radiation therapy: Secondary | ICD-10-CM | POA: Diagnosis not present

## 2015-09-06 NOTE — Progress Notes (Signed)
Nutrition follow-up completed with patient and husband after radiation therapy for cancer of the epiglottis. Final treatment is Thursday, August 3. Last weight improved and documented as 131.5 pounds July 31 up from 129.5 pounds July 24. Patient continues to tolerate Osmolite 1.5 one can 4 times a day.  She is unable to tolerate the fifth can. Tube feeding providing greater than 80% of estimated needs.  Estimated nutrition needs: 1700-2000 calories, 70-85 grams protein, 2 L fluid.  Nutrition diagnosis: Inadequate oral intake continues.  Intervention: Patient encouraged to continue Osmolite 1.5 as tolerated.  Continuing to work up to goal rate of 5 cans daily to provide 1775 cal, 74.5 g protein, 1985 mL free water. Questions were answered.  Teach back method used.  Monitoring, evaluation, goals:  Patient will tolerate adequate tube feeding to minimize weight loss and promote healing.  Next visit: To be scheduled as needed.  **Disclaimer: This note was dictated with voice recognition software. Similar sounding words can inadvertently be transcribed and this note may contain transcription errors which may not have been corrected upon publication of note.**

## 2015-09-07 ENCOUNTER — Ambulatory Visit
Admission: RE | Admit: 2015-09-07 | Discharge: 2015-09-07 | Disposition: A | Discharge status: Home / Self Care | Payer: Medicare Other | Source: Ambulatory Visit | Attending: Radiation Oncology | Admitting: Radiation Oncology

## 2015-09-07 ENCOUNTER — Ambulatory Visit: Payer: Medicare Other

## 2015-09-07 DIAGNOSIS — C320 Malignant neoplasm of glottis: Secondary | ICD-10-CM | POA: Diagnosis not present

## 2015-09-07 DIAGNOSIS — C321 Malignant neoplasm of supraglottis: Secondary | ICD-10-CM | POA: Diagnosis not present

## 2015-09-07 DIAGNOSIS — Z510 Encounter for antineoplastic radiation therapy: Secondary | ICD-10-CM | POA: Diagnosis not present

## 2015-09-07 DIAGNOSIS — Z87891 Personal history of nicotine dependence: Secondary | ICD-10-CM | POA: Diagnosis not present

## 2015-09-08 ENCOUNTER — Ambulatory Visit
Admission: RE | Admit: 2015-09-08 | Discharge: 2015-09-08 | Disposition: A | Discharge status: Home / Self Care | Payer: Medicare Other | Source: Ambulatory Visit | Attending: Radiation Oncology | Admitting: Radiation Oncology

## 2015-09-08 ENCOUNTER — Encounter: Payer: Self-pay | Admitting: Radiation Oncology

## 2015-09-08 DIAGNOSIS — Z87891 Personal history of nicotine dependence: Secondary | ICD-10-CM | POA: Diagnosis not present

## 2015-09-08 DIAGNOSIS — Z510 Encounter for antineoplastic radiation therapy: Secondary | ICD-10-CM | POA: Diagnosis not present

## 2015-09-08 DIAGNOSIS — C320 Malignant neoplasm of glottis: Secondary | ICD-10-CM | POA: Diagnosis not present

## 2015-09-08 DIAGNOSIS — C321 Malignant neoplasm of supraglottis: Secondary | ICD-10-CM | POA: Diagnosis not present

## 2015-09-16 ENCOUNTER — Encounter: Payer: Self-pay | Admitting: *Deleted

## 2015-09-16 ENCOUNTER — Other Ambulatory Visit: Payer: Self-pay | Admitting: Radiation Oncology

## 2015-09-16 ENCOUNTER — Telehealth: Payer: Self-pay | Admitting: *Deleted

## 2015-09-16 DIAGNOSIS — C321 Malignant neoplasm of supraglottis: Secondary | ICD-10-CM

## 2015-09-16 MED ORDER — HYDROCODONE-ACETAMINOPHEN 7.5-325 MG/15ML PO SOLN: 15.0000 mL | ORAL | 0 refills | Status: DC | PRN

## 2015-09-16 MED FILL — HYDROCOD-APAP 7.5-325/15ML: 7.5-325 | 5 days supply | Qty: 640 | Fill #0

## 2015-09-16 NOTE — Telephone Encounter (Addendum)
  Oncology Nurse Navigator Documentation  Oncology Nurse Navigator Flowsheets 09/16/2015  Navigator Location [facility]  Navigator Encounter Type Telephone  Telephone Outgoing Call;Medication Assistance  Abnormal Finding Date -  Confirmed Diagnosis Date -  Treatment Initiated Date -  Patient Visit Type -  Treatment Phase Post-Tx Follow-up  Barriers/Navigation Needs -  Interventions Medication assitance  Education Method -  Specialty Items/DME -  Acuity Level 2  Acuity Level 2 Survivorship  Time Spent with Patient 61   Called Ms. Kamphaus to check on her well being s/p completion of RT last week.  She was unable to talk d/t severe hoarseness, talked with her husband. He reported she:  Continues to have throat pain, taking hydrocodone-acetaminophen as prescribed throughout the day.  He noted she does not have enough to last he through the weekend and until she sees Dr. [name] on Monday.  I indicated I would obtain Rx for her, that he can come to [place] to pick up.  I later asked Dr. Lisbeth Renshaw for script.  Swallowing pills but otherwise using PEG for nutritional and hydration needs. I confirmed his understanding of 8/28 1315 SLP follow-up with Garald Balding, informed him that a 1430 follow-up with Dory Peru, Nutrition, has been scheduled in conjunction.  He understands [place] can request follow-up with Barb prior to this appt if she feels the need.  [name], RN, BSN, [place] at [place] [phone]

## 2015-09-16 NOTE — Progress Notes (Signed)
Oncology Nurse Navigator Documentation  Oncology Nurse Navigator Flowsheets 09/16/2015  Navigator Location [facility]  Navigator Encounter Type Other  Telephone -  Abnormal Finding Date -  Confirmed Diagnosis Date -  Treatment Initiated Date -  Patient Visit Type -  Treatment Phase -  Barriers/Navigation Needs -  Interventions Medication assitance  Education Method -  Specialty Items/DME -  Acuity -  Acuity Level 2 -  Time Spent with Patient 15   Met Renee [name] upon his arrival, he signed for hydrocodone-acetaminophen Rx.

## 2015-09-19 ENCOUNTER — Ambulatory Visit
Admission: RE | Admit: 2015-09-19 | Discharge: 2015-09-19 | Disposition: A | Discharge status: Home / Self Care | Payer: Medicare Other | Source: Ambulatory Visit | Attending: Radiation Oncology | Admitting: Radiation Oncology

## 2015-09-19 ENCOUNTER — Encounter: Payer: Self-pay | Admitting: Radiation Oncology

## 2015-09-19 ENCOUNTER — Other Ambulatory Visit: Payer: Self-pay | Admitting: Radiation Oncology

## 2015-09-19 VITALS — Temp 98.1°F | Ht 66.0 in | Wt 127.9 lb

## 2015-09-19 DIAGNOSIS — R300 Dysuria: Secondary | ICD-10-CM

## 2015-09-19 DIAGNOSIS — R634 Abnormal weight loss: Secondary | ICD-10-CM

## 2015-09-19 DIAGNOSIS — C321 Malignant neoplasm of supraglottis: Secondary | ICD-10-CM

## 2015-09-19 DIAGNOSIS — N390 Urinary tract infection, site not specified: Secondary | ICD-10-CM | POA: Diagnosis not present

## 2015-09-19 LAB — URINALYSIS, MICROSCOPIC - CHCC
BLOOD: NEGATIVE
Bilirubin (Urine): NEGATIVE
Glucose: NEGATIVE mg/dL
Ketones: NEGATIVE mg/dL
NITRITE: NEGATIVE
PH: 6 (ref 4.6–8.0)
PROTEIN: NEGATIVE mg/dL
SPECIFIC GRAVITY, URINE: 1.01 (ref 1.003–1.035)
UROBILINOGEN UR: 0.2 mg/dL (ref 0.2–1)

## 2015-09-19 LAB — BASIC METABOLIC PANEL
ANION GAP: 13 meq/L — AB (ref 3–11)
BUN: 19 mg/dL (ref 7.0–26.0)
CALCIUM: 10.2 mg/dL (ref 8.4–10.4)
CO2: 28 mEq/L (ref 22–29)
CREATININE: 0.8 mg/dL (ref 0.6–1.1)
Chloride: 97 mEq/L — ABNORMAL LOW (ref 98–109)
EGFR: 72 mL/min/{1.73_m2} — ABNORMAL LOW (ref >90)
GLUCOSE: 106 mg/dL (ref 70–140)
Potassium: 4.1 mEq/L (ref 3.5–5.1)
Sodium: 138 mEq/L (ref 136–145)

## 2015-09-19 MED ORDER — NITROFURANTOIN MONOHYD MACRO 100 MG PO CAPS: 100.0000 mg | ORAL_CAPSULE | Freq: Two times a day (BID) | ORAL | 0 refills | Status: DC

## 2015-09-19 MED ORDER — HYDROCODONE-ACETAMINOPHEN 7.5-325 MG/15ML PO SOLN: 15.0000 mL | ORAL | 0 refills | Status: DC | PRN

## 2015-09-19 NOTE — Progress Notes (Signed)
Radiation Oncology         [phone] ________________________________  Name: Renee [name] MRN: [id number]  Date: 09/19/2015  DOB: 03/02/1934  Follow-Up Visit Note  CC: [name], MD  [name], MD  Diagnosis and Prior Radiotherapy:       ICD-9-CM ICD-10-CM   1. Urinary tract infection, site not specified 599.0 N39.0 nitrofurantoin, macrocrystal-monohydrate, (MACROBID) 100 MG capsule  2. 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   Pain issues, if any: She reports constant pain in her mouth and throat. She reports her pain is worse when her dentures are in, and she will stay in her bedroom without her dentures in. She is not using the fentanyl patch. She is using the Hycet pain medicine 10-15 ml about every 5 hours, and 71ml at midnight each night.  Using a feeding tube?: Yes, She is instilling 4 cans of osmolite daily. She is instilling around 1500 ml of water daily through her feeding tube.  Weight changes, if any:  Wt Readings from Last 3 Encounters:  09/19/15 127 lb 14.4 oz (58 kg)  09/05/15 131 lb 8 oz (59.6 kg)  08/29/15 129 lb 8 oz (58.7 kg)   Swallowing issues, if any: She is not swallowing anything except some water   Smoking or chewing tobacco? No Using fluoride trays daily? Yes daily Last ENT visit was on: Not since diagnosis.  Other notable issues, if any:  She reports thick saliva that is green/ yellow at times.  She reports symptoms of a urinary tract infection with burning with urination, and frequency.  She reports the left side of her face is "stopped up at times". She denies sinus pressure.                 ALLERGIES:  is allergic to levofloxacin; penicillins; spiriva [tiotropium bromide monohydrate]; statins;  budesonide-formoterol fumarate; morphine; phenazopyridine hcl; pneumococcal vaccines; prednisone; sulfa antibiotics; and sulfonamide derivatives.  Meds: Current Outpatient Prescriptions  Medication Sig Dispense Refill  . apixaban (ELIQUIS) 5 MG TABS tablet Take 1 tablet (5 mg total) by mouth 2 (two) times daily. 60 tablet 1  . cholecalciferol (VITAMIN D) 1000 UNITS tablet Take 1,000 Units by mouth daily.    [name] esomeprazole (NEXIUM) 40 MG capsule Take 40 mg by mouth daily before breakfast.      . HYDROcodone-acetaminophen (HYCET) 7.5-325 mg/15 ml solution Place 15 mLs into feeding tube every 4 (four) hours as needed for moderate pain (May take 68mL QHS and in the middle of the night.). 640 mL 0  . levothyroxine (SYNTHROID, LEVOTHROID) 25 MCG tablet Take 25 mcg by mouth daily before breakfast.     . metoprolol succinate (TOPROL-XL) 25 MG 24 hr tablet Take 1 tablet (25 mg total) by mouth daily.    . nitroGLYCERIN (NITROSTAT) 0.4 MG SL tablet Place 0.4 mg under the tongue every 5 (five) minutes x 3 doses as needed for chest pain.     . Nutritional Supplements (FEEDING SUPPLEMENT, OSMOLITE 1.5 CAL,) LIQD Give 1 can of Osmolite 1.5 or equivalent - 5 times daily with 60 cc free water before and after bolus feeding via PEG.  Give additional 240 cc water BID. Send formula and supplies. 1185 mL 0  . ondansetron (ZOFRAN) 4 MG  tablet Take 1 tablet (4 mg total) by mouth every 8 (eight) hours as needed for nausea or vomiting. 50 tablet 3  . OXYGEN Inhale 2 L into the lungs as directed. As needed for shortness of breath    . Polyethyl Glycol-Propyl Glycol (SYSTANE OP) Place 1 drop into both eyes 2 (two) times daily.     . sodium fluoride (FLUORISHIELD) 1.1 % GEL dental gel Instill one drop of gel per tooth space of fluoride tray. Place over teeth for 5 minutes. Remove. Spit out excess. Repeat nightly. 120 mL prn  . acetaminophen (TYLENOL) 500 MG tablet Take 500 mg by mouth every 6 (six) hours as needed for moderate  pain or headache.     . albuterol (PROAIR HFA) 108 (90 Base) MCG/ACT inhaler Inhale 2 puffs into the lungs every 6 (six) hours as needed for wheezing or shortness of breath.     . ALPRAZolam (XANAX) 0.25 MG tablet Take 0.125 mg by mouth 2 (two) times daily as needed for anxiety or sleep.     . fentaNYL (DURAGESIC - DOSED MCG/HR) 25 MCG/HR patch Place 1 patch (25 mcg total) onto the skin every 3 (three) days. (Patient not taking: Reported on 09/19/2015) 5 patch 0  . lidocaine (XYLOCAINE) 2 % solution Mix 1 part 2%viscous lidocaine,1part H2O.Swish and/or swallow 5mL of this mixture,75min before meals and at bedtime, up to QID (Patient not taking: Reported on 08/29/2015) 100 mL 5  . nitrofurantoin, macrocrystal-monohydrate, (MACROBID) 100 MG capsule Take 1 capsule (100 mg total) by mouth 2 (two) times daily. Take for 5 days for UTI. 10 capsule 0  . senna (SENOKOT) 8.6 MG tablet Take 1 tablet by mouth daily as needed for constipation.      No current facility-administered medications for this encounter.     Physical Findings: The patient is in no acute distress. Patient is alert and oriented. Wt Readings from Last 3 Encounters:  09/19/15 127 lb 14.4 oz (58 kg)  09/05/15 131 lb 8 oz (59.6 kg)  08/29/15 129 lb 8 oz (58.7 kg)    height is 5\' 6"  (1.676 m) and weight is 127 lb 14.4 oz (58 kg). Her temperature is 98.1 F (36.7 C). Her oxygen saturation is 98%. .  General: Alert and oriented, in no acute distress; in WC; non toxic appearing HEENT: Head is normocephalic. Extraocular movements are intact. Oropharynx is notable for no thrush; erythema noted. Mucosa is moist. Neck: Neck is notable for healing skin. Skin: Skin in treatment fields shows satisfactory healing- The skin over her neck is healing well, but there is still redness present.  Psychiatric: Judgment and insight are intact. Affect is appropriate.   Lab Findings: Lab Results  Component Value Date   WBC 6.0 08/22/2015   HGB 12.3  08/22/2015   HCT 39.0 08/22/2015   MCV 85.3 08/22/2015   PLT 406 (H) 08/22/2015    Lab Results  Component Value Date   TSH 1.98 01/18/2014    Radiographic Findings: No results found.  Impression/Plan:    1) Head and Neck Cancer Status: healing from RT  2) Nutritional Status: vacillating weights - continue using PEG as aggressively as possibly Wt Readings from Last 3 Encounters:  09/19/15 127 lb 14.4 oz (58 kg)  09/05/15 131 lb 8 oz (59.6 kg)  08/29/15 129 lb 8 oz (58.7 kg)     3) Risk Factors: The patient has been educated about risk factors including alcohol and tobacco abuse; they understand that avoidance of alcohol  and tobacco is important to prevent recurrences as well as other cancers  4) Swallowing: following with SLP  5) Dental: Encouraged to continue regular followup with dentistry, and dental hygiene including fluoride rinses.    6) Thyroid function: check in 3 mo Lab Results  Component Value Date   TSH 1.98 01/18/2014    7) Other: UTI symptoms and UA abnl - start nitrofurantoin, check cultures when ready  Orthostatic hypotension, chronic.  Not clinically dehydrated. BMP is satisfactory.  Push water/ fluids to 2046mL daily in addition to nutritional shakes.  1/4tsp salt supplement daily.  Rise from chair with caution.  She has been seen by cardiology about this.  Pain - desires to take Hycet only. Refill given today.  8) Follow-up in 3-3.5 months with PET/CT prior to visit w/ me.    She will see  Mike Craze, NP of survivorship  in 1 wk.  I encouraged patient to call me or Elzie Rings with any concerns or questions that might arise before then.    _____________________________________   Eppie Gibson, MD

## 2015-09-19 NOTE — Progress Notes (Signed)
  Renee [name] presents for follow up of radiation completed 09/08/15 to her [facility].   Pain issues, if any: She reports constant pain in her mouth and throat. She reports her pain is worse when her dentures are in, and she will stay in her bedroom without her dentures in. She is not using the fentanyl patch. She is using the Hycet pain medicine 10-15 ml about every 5 hours, and 56ml at midnight each night.  Using a feeding tube?: Yes, She is instilling 4 cans of osmolite daily. She is instilling around 1500 ml of water daily through her feeding tube.  Weight changes, if any:  Wt Readings from Last 3 Encounters:  09/19/15 127 lb 14.4 oz (58 kg)  09/05/15 131 lb 8 oz (59.6 kg)  08/29/15 129 lb 8 oz (58.7 kg)   Swallowing issues, if any: She is not swallowing anything except some water due to pain in her throat.  Smoking or chewing tobacco? No Using fluoride trays daily? Yes daily Last ENT visit was on: Not since diagnosis.  Other notable issues, if any:  She reports thick saliva that is green/ yellow at times.  She reports symptoms of a urinary tract infection with burning with urination, and frequency.  The skin over her neck is healing well, but there is still redness present.  She reports the left side of her face is "stopped up at times". She denies sinus pressure.   Temp 98.1 F (36.7 C)   Ht 5\' 6"  (1.676 m)   Wt 127 lb 14.4 oz (58 kg)   SpO2 98% Comment: room air  BMI 20.64 kg/m    Orthostatics: BP sitting 130/67 pulse 99, BP standing 80/67, pulse 99.

## 2015-09-20 ENCOUNTER — Other Ambulatory Visit: Payer: Self-pay | Admitting: Radiation Oncology

## 2015-09-20 ENCOUNTER — Encounter: Payer: Self-pay | Admitting: Radiation Oncology

## 2015-09-20 DIAGNOSIS — R634 Abnormal weight loss: Secondary | ICD-10-CM

## 2015-09-20 DIAGNOSIS — C321 Malignant neoplasm of supraglottis: Secondary | ICD-10-CM

## 2015-09-21 ENCOUNTER — Other Ambulatory Visit: Payer: Self-pay | Admitting: Radiation Oncology

## 2015-09-21 DIAGNOSIS — N3000 Acute cystitis without hematuria: Secondary | ICD-10-CM

## 2015-09-21 LAB — URINE CULTURE

## 2015-09-21 MED ORDER — CIPROFLOXACIN HCL 500 MG PO TABS: 500.0000 mg | ORAL_TABLET | Freq: Two times a day (BID) | ORAL | 0 refills | Status: DC

## 2015-09-21 NOTE — Progress Notes (Signed)
  Radiation Oncology         (336) [phone] ________________________________  Name: Renee [name] MRN: [id number]  Date: 09/08/2015  DOB: 1934-11-24  End of Treatment Note  Diagnosis:      ICD-9-CM ICD-10-CM   1. Carcinoma of epiglottis (Renee [name]) 161.1 C32.1        Indication for treatment:  Curative ,  Without chemotherapy     Radiation treatment dates:  07/19/2015-09/08/2015  Site/dose:    1. The supraglottis and bilateral neck were treated PTV High to 70 Gy in 35 fractions at 2 Gy per fraction.  2. The supraglottis and bilateral neck were treated PTV Med to 63 Gy in 35 fractions at 1.8 Gy per fraction. 3. The supraglottis and bilateral neck were treated PTV Low to 56 Gy in 35 fractions at 1.6 Gy per fraction.  Beams/energy:    1. IMRT // 6X 2. IMRT // 6X 3. IMRT // 6X  Narrative: The patient tolerated radiation treatment relatively well.   She reported continued throat pain managed with pain medications. She developed erythema with mucositis in her oropharynx with thick saliva. Neck had bright erythema with dry peeling. She elected to have PEG placed midway through her course.  Plan: The patient has completed radiation treatment. The patient will return to radiation oncology clinic for routine followup in one half month. I advised them to call or return sooner if they have any questions or concerns related to their recovery or treatment.  -----------------------------------  Eppie Gibson, MD   This document serves as a record of services personally performed by Eppie Gibson, MD. It was created on her behalf by Arlyce Harman, a trained medical scribe. The creation of this record is based on the scribe's personal observations and the provider's statements to them. This document has been checked and approved by the attending provider.

## 2015-09-21 NOTE — Progress Notes (Signed)
Discussed Urine cx results w/ pt.  Given that urinary sx are better, I told her to complete her nitrofurantoin (Intermediate sensitivity).  She will start Cipro after that x 3 days. She states tolerance of this med in the past. She expressed understanding and gratitude for the call. Rx sent to her pharmacy. -----------------------------------  Eppie Gibson, MD

## 2015-09-26 ENCOUNTER — Encounter: Payer: Medicare Other | Admitting: Adult Health

## 2015-09-26 ENCOUNTER — Telehealth: Payer: Self-pay | Admitting: *Deleted

## 2015-09-26 NOTE — Telephone Encounter (Signed)
  Oncology Nurse Navigator Documentation  Received call from patient's husband indicating that she is unable to keep this afternoon's Survivorship appt d/t persistent diarrhea r/t the start of an antibiotic last week for a UTI.  She denies fever, N&V.  I encouraged increased water intake via PEG.  I notified Survivorship NP Mike Craze of appt cancellation, RadOnc medical secretary re need for rescheduled appt.  [name], RN, BSN, [place] at [facility] [phone]   Navigator Location: [place] [phone]/21/17 1109) Navigator Encounter Type: Telephone (09/26/15 1109) Telephone: Incoming Call;Patient Update (09/26/15 1109)                 Interventions: Coordination of Care (09/26/15 1109)                      Time Spent with Patient: 15 (09/26/15 1109)

## 2015-09-26 NOTE — Progress Notes (Deleted)
CLINIC:  Survivorship  REASON FOR VISIT:  Routine follow-up post-treatment for H&N cancer to address acute survivorship needs.    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 Direct visualization & possible biopsy recommended. No adenopathy.       05/20/2015 Imaging    CT neck: Nodularity of (L) epiglottis could represent a mass of polyp. No adenopathy.       06/03/2015 Initial Biopsy    Direct laryngoscopy Renee [name]). Epiglottis biopsy: Squamous cell carcinoma.  (R) lateral wall larynx biopsy: Squamous cell carcinoma, moderately to poorly differentiated. p16 strongly (+) on both specimens.       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 3 sub-cm hypermetabolic (R) cervical LNs consistent with nodal  mets. 2 level 2 and 1 level 3 LN. No distant mets.       07/19/2015 - 09/08/2015 Radiation Therapy    IMRT [name]). The supraglottis and bilateral neck were treated to 70 Gy in 35 fractions. Intermediate risk nodal echelons were treated  to 63 Gy in 35 fractions.  Lower risk nodal echelons were treated to 56 Gy in 35 fractions        INTERVAL HISTORY:  Renee [name] presents today for routine follow-up post-completion of radiation therapy.  She completed treatment on 09/08/15 and is about 2 weeks out as of today.   She has a hoarse voice and wonders why it is getting worse.  She has questions about her skin as it relates to her radiation treatments.  She denies any dizziness, either seated or with standing.  She tells me that she was told to start salt tablets and get an abdominal binder to help increase her blood pressure.   She largely feels the same as she did on Monday at her routine PUT visit with Dr. [name], with the exception of  hoarse voice that is worse to her.   ADDITIONAL REVIEW OF SYSTEMS:  Review of Systems  Constitutional: Positive for malaise/fatigue.       (+) fatigue  HENT: Positive for sore throat.        She is working on her pain management; current regimen is adequate  Respiratory: Positive for cough.   Gastrointestinal: Negative for diarrhea, nausea and vomiting.       Bowels moving with Senokot  Genitourinary: Negative for dysuria.  Skin: Positive for rash.       Neck red, peeling.   Neurological: Positive for speech change. Negative for dizziness.  Psychiatric/Behavioral: Negative for depression. The patient is not nervous/anxious.      CURRENT MEDICATIONS:  Current Outpatient Prescriptions on File Prior to Visit  Medication Sig Dispense Refill  . acetaminophen (TYLENOL) 500 MG tablet Take 500 mg by mouth every 6 (six) hours as needed for moderate pain or headache.     . albuterol (PROAIR HFA) 108 (90 Base) MCG/ACT inhaler Inhale 2 puffs into the lungs every 6 (six) hours as needed for wheezing or shortness of breath.     . ALPRAZolam (XANAX) 0.25 MG tablet Take 0.125 mg by mouth 2 (two) times daily as needed for anxiety or sleep.     [name] apixaban (ELIQUIS) 5 MG TABS tablet Take 1 tablet (5 mg total) by mouth 2 (  two) times daily. 60 tablet 1  . cholecalciferol (VITAMIN D) 1000 UNITS tablet Take 1,000 Units by mouth daily.    . ciprofloxacin (CIPRO) 500 MG tablet Take 1 tablet (500 mg total) by mouth 2 (two) times daily. Take with nutritional shake. 6 tablet 0  . esomeprazole (NEXIUM) 40 MG capsule Take 40 mg by mouth daily before breakfast.      . fentaNYL (DURAGESIC - DOSED MCG/HR) 25 MCG/HR patch Place 1 patch (25 mcg total) onto the skin every 3 (three) days. (Patient not taking: Reported on 09/19/2015) 5 patch 0  . HYDROcodone-acetaminophen (HYCET) 7.5-325 mg/15 ml solution Place 15 mLs into feeding tube every 4 (four) hours as needed for moderate pain (May take 33mL QHS and in the middle of  the night.). 640 mL 0  . levothyroxine (SYNTHROID, LEVOTHROID) 25 MCG tablet Take 25 mcg by mouth daily before breakfast.     . lidocaine (XYLOCAINE) 2 % solution Mix 1 part 2%viscous lidocaine,1part H2O.Swish and/or swallow 26mL of this mixture,66min before meals and at bedtime, up to QID (Patient not taking: Reported on 08/29/2015) 100 mL 5  . metoprolol succinate (TOPROL-XL) 25 MG 24 hr tablet Take 1 tablet (25 mg total) by mouth daily.    . nitrofurantoin, macrocrystal-monohydrate, (MACROBID) 100 MG capsule Take 1 capsule (100 mg total) by mouth 2 (two) times daily. Take for 5 days for UTI. 10 capsule 0  . nitroGLYCERIN (NITROSTAT) 0.4 MG SL tablet Place 0.4 mg under the tongue every 5 (five) minutes x 3 doses as needed for chest pain.     . Nutritional Supplements (FEEDING SUPPLEMENT, OSMOLITE 1.5 CAL,) LIQD Give 1 can of Osmolite 1.5 or equivalent - 5 times daily with 60 cc free water before and after bolus feeding via PEG.  Give additional 240 cc water BID. Send formula and supplies. 1185 mL 0  . ondansetron (ZOFRAN) 4 MG tablet Take 1 tablet (4 mg total) by mouth every 8 (eight) hours as needed for nausea or vomiting. 50 tablet 3  . OXYGEN Inhale 2 L into the lungs as directed. As needed for shortness of breath    . Polyethyl Glycol-Propyl Glycol (SYSTANE OP) Place 1 drop into both eyes 2 (two) times daily.     [name] senna (SENOKOT) 8.6 MG tablet Take 1 tablet by mouth daily as needed for constipation.     . sodium fluoride (FLUORISHIELD) 1.1 % GEL dental gel Instill one drop of gel per tooth space of fluoride tray. Place over teeth for 5 minutes. Remove. Spit out excess. Repeat nightly. 120 mL prn  . [DISCONTINUED] budesonide-formoterol (SYMBICORT) 160-4.5 MCG/ACT inhaler Inhale 2 puffs into the lungs 2 (two) times daily. 1 Inhaler 12  . [DISCONTINUED] calcium citrate-vitamin D (CITRACAL+D) 315-200 MG-UNIT per tablet Take 1 tablet by mouth 2 (two) times daily.       No current  facility-administered medications on file prior to visit.     ALLERGIES:  Allergies  Allergen Reactions  . Levofloxacin Nausea Only    Unable to tolerate more than 7 days  . Penicillins Swelling and Rash    Swelling location undefined. Has patient had a PCN reaction causing immediate rash, facial/tongue/throat swelling, SOB or lightheadedness with hypotension: Yes Has patient had a PCN reaction causing severe rash involving mucus membranes or skin necrosis: No Has patient had a PCN reaction that required hospitalization Unk Has patient had a PCN reaction occurring within the last 10 years: Unk If all of the above answers are "NO",  then may proceed with Cephalosporin use.   [name] Spiriva [Tiotropium Bromide Monohydrate] Other (See Comments)    Throat irritation  . Statins Other (See Comments)    Myalgias  . Budesonide-Formoterol Fumarate Other (See Comments)    Tachycardia  . Morphine Other (See Comments)    double vision  . Phenazopyridine Hcl Other (See Comments)    *piridium* face red  . Pneumococcal Vaccines Swelling    [place] of the skin  . Prednisone Swelling    Can't take PO but injectable is fine  . Sulfa Antibiotics Rash  . Sulfonamide Derivatives Rash     PHYSICAL EXAM:  Vitals: There were no vitals filed for this visit. *Orthostatic VS as above  Weight Date   09/26/15  127 lb 14.4 oz (58 kg) 09/19/15  131 lb 8 oz (59.6 kg) 09/05/15  129 lb 8 oz (58.7 kg) 08/29/15  126 lb 12.8 oz (57.516 kg) 08/26/15  126 lb 14.4 oz (57.561 kg) 08/22/15  127 lb 12.8 oz (57.97 kg) 08/10/15  129 lb 4.8 oz (58.65 kg) 08/05/15  132 lb 1.6 oz (59.92 kg) 08/01/15  128 lb 1.6 oz (58.106 kg) 07/25/15  129 lb 1.6 oz (58.559 kg) Pre-treatment: 06/22/15   General: Female in no acute distress. Accompanied by her husband today.  Seen seated in wheelchair. HEENT: Head normocephalic.  Pupils equal and reactive to light. Conjunctivae clear without exudate.  Sclerae anicteric. Oral mucosa is pink;  dentures in place. Tongue is moist and without thrush. Posterior oropharynx is mildly erythematous.   Neck: Skin on neck is red, peeling, and hyperpigmented.    Cardiovascular: Tachycardic, but regular rhythm.  Respiratory: Bilat upper lobes with slight rhonchi. Patient coughing intermittently.  Bases diminished.  Breathing non-labored. GI: Abdomen soft and round. Bowel sounds normoactive. G-tube in place.  GU: Deferred.   Neuro: No focal deficits.  Psych: Normal mood and affect for situation. Extremities: No edema. Skin: Warm and dry.    LABORATORY DATA:  None for this visit  DIAGNOSTIC IMAGING:  None at this visit.    ASSESSMENT & PLAN:  Renee [name] is a pleasant 81 y.o. 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  Completed fraction #26/35 today, 08/26/15.  She presents to clinic today for routine follow-up as requested by Dr. [name].   1. Cancer of the epiglottis:  Renee [name] is tolerating radiation as expected and should continue her planned radiation treatments, as prescribed by Dr. [name].  She completed fraction 26/35 today. I explained that her voice changes are likely related to vocal cord edema/inflammation related to her radiation therapy.   2. Orthostatic hypotension: While she does have orthostatic hypotension in clinic, she remains asymptomatic.  Her vitals are largely unchanged from her evaluation with Dr. [name] on Monday, 08/22/15.  We discussed potentially giving IV fluids today. However, she denies any dizziness or syncopal episodes.  She states, "I really do feel okay." I encouraged her to take the salt tablets as previously directed.  I also encouraged her to put at least one bottle of Gatorade or other sports drink in her G-tube every day and flush well with water. This will help provide her with additional electrolyte and fluid support to increase her blood pressure.   I gave her strict safety precautions for the  weekend that if she becomes weaker, dizzy, pre-syncopal/syncopal, or feels worse then she should go to an urgent care or her closest ED for further evaluation.  I also  gave her the [phone] to call over the weekend should she need to speak with an oncology physician on-call.  She voiced understanding and agreed with this plan.     3. Radiation dermatitis:  She has dry desquamation and erythema.  There is no moist desquamation. Encouraged her to continue to use the Sonafine cream as previously prescribed.  There are very small areas of skin that have peeled and are more tender; encouraged her to apply Neosporin to these areas.    4. Protein-calorie malnutrition, stable: She is tolerating her tube feedings well. Her weight is stable, which is encouraging. She will follow-up with Dory Peru, RD as directed.      Dispo:   -***   A total of ** minutes was spent in face-to-face care of this patient with greater than 50% of that time spent in counseling & care-coordination.   Mike Craze, NP [facility] [phone]

## 2015-09-26 NOTE — Telephone Encounter (Signed)
Thank you for letting me know.    Enid Derry, if you will call the patient to see if we can reschedule, that would be great.  Dr. [name] wanted me to see her this week, but if she isn't feeling up to it, then next week would be fine.   Thanks! Elzie Rings

## 2015-09-29 ENCOUNTER — Ambulatory Visit (HOSPITAL_BASED_OUTPATIENT_CLINIC_OR_DEPARTMENT_OTHER): Payer: Medicare Other | Admitting: Adult Health

## 2015-09-29 ENCOUNTER — Telehealth: Payer: Self-pay | Admitting: Adult Health

## 2015-09-29 ENCOUNTER — Encounter: Payer: Self-pay | Admitting: Adult Health

## 2015-09-29 VITALS — BP 87/58 | HR 98 | Temp 97.9°F | Resp 18 | Ht 66.0 in | Wt 129.1 lb

## 2015-09-29 DIAGNOSIS — C321 Malignant neoplasm of supraglottis: Secondary | ICD-10-CM

## 2015-09-29 DIAGNOSIS — B370 Candidal stomatitis: Secondary | ICD-10-CM

## 2015-09-29 MED ORDER — FLUCONAZOLE 100 MG PO TABS: 100.0000 mg | ORAL_TABLET | Freq: Every day | ORAL | 0 refills | Status: AC

## 2015-09-29 NOTE — Telephone Encounter (Signed)
appt made and avs printed °

## 2015-09-29 NOTE — Patient Instructions (Signed)
It was great seeing you today.  Call me with any concerns!  Mike Craze, NP [place] [phone]

## 2015-09-29 NOTE — Progress Notes (Signed)
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 Direct visualization & possible biopsy recommended. No adenopathy.       05/20/2015 Imaging    CT neck: Nodularity of (L) epiglottis could represent a mass of polyp. No adenopathy.       06/03/2015 Initial Biopsy    Direct laryngoscopy [name]). Epiglottis biopsy: Squamous cell carcinoma.  (R) lateral wall larynx biopsy: Squamous cell carcinoma, moderately to poorly differentiated. p16 strongly (+) on both specimens.       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 3 sub-cm hypermetabolic (R) cervical LNs consistent with nodal  mets. 2 level 2 and 1 level 3 LN. No distant mets.       07/19/2015 - 09/08/2015 Radiation Therapy    IMRT [name]). The supraglottis and bilateral neck were treated to 70 Gy in 35 fractions. Intermediate risk nodal echelons were treated  to 63 Gy in 35 fractions.  Lower risk nodal echelons were treated to 56 Gy in 35 fractions        INTERVAL HISTORY:  Ms. Mccalman presents for routine follow-up since completing radiation therapy on 09/08/15.    She recently had a UTI and has completed her course of Nitrofurantoin for that; her symptoms have since resolved.  She did have some diarrhea with the antibiotics, but that has resolved. She is concerned that her voice is still so hoarse; she wants to know if this will get better.  She endorses some dizziness with standing, but no falls.  Her pain is well-controlled with the Hycet.  She is trying to do the swallowing exercises, but they are difficult d/t pain. She tells me that she is scheduled to see the  Speech Therapist next week.     ADDITIONAL REVIEW OF SYSTEMS:  Review of Systems  Constitutional: Positive for malaise/fatigue.       (+) fatigue; improving some   HENT: Positive for sore throat.        Hycet alone controlled pain well.   Respiratory: Positive for cough and sputum production.        (+) phlegm production   Gastrointestinal: Negative for nausea and vomiting.       Bowels moving regularly   Genitourinary: Negative for dysuria and hematuria.  Musculoskeletal: Negative for falls.  Skin: Positive for rash.       Neck red, peeling.   Neurological: Positive for speech change. Negative for dizziness.       Voice hoarse    CURRENT MEDICATIONS:  Current Outpatient Prescriptions on File Prior to Visit  Medication Sig Dispense Refill  . acetaminophen (TYLENOL) 500 MG tablet Take 500 mg by mouth every 6 (six) hours as needed for moderate pain or headache.     . albuterol (PROAIR HFA) 108 (90 Base) MCG/ACT inhaler Inhale 2 puffs into the lungs every 6 (six) hours as needed for wheezing or shortness of breath.     . ALPRAZolam (XANAX) 0.25 MG tablet Take 0.125 mg by mouth 2 (two) times daily as needed for anxiety or sleep.     [name] apixaban (ELIQUIS) 5 MG TABS tablet  Take 1 tablet (5 mg total) by mouth 2 (two) times daily. 60 tablet 1  . cholecalciferol (VITAMIN D) 1000 UNITS tablet Take 1,000 Units by mouth daily.    . ciprofloxacin (CIPRO) 500 MG tablet Take 1 tablet (500 mg total) by mouth 2 (two) times daily. Take with nutritional shake. 6 tablet 0  . esomeprazole (NEXIUM) 40 MG capsule Take 40 mg by mouth daily before breakfast.      . fentaNYL (DURAGESIC - DOSED MCG/HR) 25 MCG/HR patch Place 1 patch (25 mcg total) onto the skin every 3 (three) days. (Patient not taking: Reported on 09/19/2015) 5 patch 0  . HYDROcodone-acetaminophen (HYCET) 7.5-325 mg/15 ml solution Place 15 mLs into feeding tube every 4 (four) hours as needed for moderate pain (May take 73mL QHS and in the middle  of the night.). 640 mL 0  . levothyroxine (SYNTHROID, LEVOTHROID) 25 MCG tablet Take 25 mcg by mouth daily before breakfast.     . lidocaine (XYLOCAINE) 2 % solution Mix 1 part 2%viscous lidocaine,1part H2O.Swish and/or swallow 72mL of this mixture,35min before meals and at bedtime, up to QID (Patient not taking: Reported on 08/29/2015) 100 mL 5  . metoprolol succinate (TOPROL-XL) 25 MG 24 hr tablet Take 1 tablet (25 mg total) by mouth daily.    . nitrofurantoin, macrocrystal-monohydrate, (MACROBID) 100 MG capsule Take 1 capsule (100 mg total) by mouth 2 (two) times daily. Take for 5 days for UTI. 10 capsule 0  . nitroGLYCERIN (NITROSTAT) 0.4 MG SL tablet Place 0.4 mg under the tongue every 5 (five) minutes x 3 doses as needed for chest pain.     . Nutritional Supplements (FEEDING SUPPLEMENT, OSMOLITE 1.5 CAL,) LIQD Give 1 can of Osmolite 1.5 or equivalent - 5 times daily with 60 cc free water before and after bolus feeding via PEG.  Give additional 240 cc water BID. Send formula and supplies. 1185 mL 0  . ondansetron (ZOFRAN) 4 MG tablet Take 1 tablet (4 mg total) by mouth every 8 (eight) hours as needed for nausea or vomiting. 50 tablet 3  . OXYGEN Inhale 2 L into the lungs as directed. As needed for shortness of breath    . Polyethyl Glycol-Propyl Glycol (SYSTANE OP) Place 1 drop into both eyes 2 (two) times daily.     [name] senna (SENOKOT) 8.6 MG tablet Take 1 tablet by mouth daily as needed for constipation.     . sodium fluoride (FLUORISHIELD) 1.1 % GEL dental gel Instill one drop of gel per tooth space of fluoride tray. Place over teeth for 5 minutes. Remove. Spit out excess. Repeat nightly. 120 mL prn  . [DISCONTINUED] budesonide-formoterol (SYMBICORT) 160-4.5 MCG/ACT inhaler Inhale 2 puffs into the lungs 2 (two) times daily. 1 Inhaler 12  . [DISCONTINUED] calcium citrate-vitamin D (CITRACAL+D) 315-200 MG-UNIT per tablet Take 1 tablet by mouth 2 (two) times daily.       No current  facility-administered medications on file prior to visit.     ALLERGIES:  Allergies  Allergen Reactions  . Levofloxacin Nausea Only    Unable to tolerate more than 7 days  . Penicillins Swelling and Rash    Swelling location undefined. Has patient had a PCN reaction causing immediate rash, facial/tongue/throat swelling, SOB or lightheadedness with hypotension: Yes Has patient had a PCN reaction causing severe rash involving mucus membranes or skin necrosis: No Has patient had a PCN reaction that required hospitalization Unk Has patient had a PCN reaction occurring within the last 10 years:  Unk If all of the above answers are "NO", then may proceed with Cephalosporin use.   [name] Spiriva [Tiotropium Bromide Monohydrate] Other (See Comments)    Throat irritation  . Statins Other (See Comments)    Myalgias  . Budesonide-Formoterol Fumarate Other (See Comments)    Tachycardia  . Morphine Other (See Comments)    double vision  . Phenazopyridine Hcl Other (See Comments)    *piridium* face red  . Pneumococcal Vaccines Swelling    Redding of the skin  . Prednisone Swelling    Can't take PO but injectable is fine  . Sulfa Antibiotics Rash  . Sulfonamide Derivatives Rash     PHYSICAL EXAM:  Vitals: Vitals:   09/29/15 0902 09/29/15 0917  BP: 121/64 (!) 87/58  Pulse: 98   Resp: 18   Temp: 97.9 F (36.6 C)    *Orthostatic VS as above.  Weight Date  129 lb 1.6 oz (58.6 kg) 09/29/15  127 lb 14.4 oz (58 kg) 09/19/15  131 lb 8 oz (59.6 kg) 09/05/15  129 lb 8 oz (58.7 kg) 08/29/15  126 lb 12.8 oz (57.516 kg) 08/26/15  126 lb 14.4 oz (57.561 kg) 08/22/15  127 lb 12.8 oz (57.97 kg) 08/10/15  129 lb 4.8 oz (58.65 kg) 08/05/15  132 lb 1.6 oz (59.92 kg) 08/01/15  128 lb 1.6 oz (58.106 kg) 07/25/15  129 lb 1.6 oz (58.559 kg) Pre-treatment: 06/22/15   General: Female in no acute distress. Accompanied by her husband today.  Seen seated in wheelchair. HEENT: Head normocephalic.  Pupils equal and  reactive to light. Conjunctivae clear without exudate.  Sclerae anicteric. Oral mucosa is pink; dentures in place. Saliva thick. Tongue is moist; (+) budding appearance thrush on tongue. Posterior oropharynx is mildly erythematous.   Lymph: No appreciable cervical, supraclavicular, or infraclavicular lymphadenopathy noted on palpation.  Neck: Skin on neck very dry and mildly hyperpigmented.   Cardiovascular: Regular rate and regular rhythm.  Respiratory: Bilat upper lobes with slight rhonchi. Patient coughing intermittently.  Bases diminished.  Breathing non-labored. GI: Abdomen soft and round. Bowel sounds normoactive. G-tube in place.  GU: Deferred.   Neuro: No focal deficits.  Psych: Normal mood and affect for situation. Extremities: No edema. Skin: Warm and dry.    LABORATORY DATA:  None for this visit  DIAGNOSTIC IMAGING:  None at this visit.    ASSESSMENT & PLAN:  Ms. Fortini is a pleasant 81 y.o. 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 She presents to the [facility] today for routine follow-up to address any acute concerns since completing treatment.   1. Cancer of the epiglottis:  Ms. Maclaren is continuing to recover from definitive radiation therapy treatment for head & neck cancer. She remains concerned about her hoarse voice, which I reinforced was likely part of the inflammation associated with her treatments.  We are hopeful she will regain some of her voice quality as she continues to heal.  I encouraged her to discuss this with Garald Balding, SLP as well.  She also is having some anxiety as it relates to her response to treatment.  I reviewed her original PET scan images with her to highlight the areas where we knew the cancer was and reinforced that she was treated with radiation in these specific areas.  I explained the purpose of the re-staging PET scan in a few months to monitor the cancer's response to  treatment.  This reportedly provided her some  reassurance.  I will see her in 1 month to continue to closely follow-up on any acute treatment related concerns.  She will follow-up with Dr. [name] in 12/2015 with restaging PET scan.   2. Recent Klebsiella pneumoniae UTI: She has completed treatment with Nitrofurantoin, which showed intermediate susceptibility on urine culture from 09/19/15; she is allergic to penicillins.  Her symptoms have resolved. I encouraged her to let us know if she has any new symptoms and we could see her as needed.   3. Oral candidiasis: Ms. Shamp does have some mild thrush on her tongue on exam today.  This is very common in patients treated for head & neck cancers.  I have e-prescribed Diflucan 100 mg po/VT daily for the next 21 days; this prescription was sent to her [employer].  I explained the purpose of this medication and the importance of treating oral fungal infections in H&N patients.    4. Orthostatic hypotension: While she does have orthostatic hypotension in clinic, clinically she is not dehydrated.  She was seen by her cardiologist for the orthostatic hypotension and was told to wear an abdominal binder to increase her BP. She has not done so yet because of her G-tube.  She has some dizziness with standing, but this has been intermittent both during and after her radiation treatments.  I encouraged her to use caution when standing and before walking to prevent falls.  She should continue pushing fluids and the salt intake recommendations given to her by Dr. [name].   5. Dysphagia/Pain management:  Ms. Tekle has some dysphagia; she is scheduled to see Garald Balding, SLP with speech therapy next week. The exercises are difficult for her at this time due to the pain with swallowing. I reinforced the importance of doing her swallowing exercises as she is able to tolerate them.  Her pain is well-controlled with the Hycet.  She does not use the Fentanyl patch  because "it was too strong."  She states that she has plenty of her pain medication and does not need a refill today.    6. Protein-calorie malnutrition, stable: She is tolerating her tube feedings well. Her weight is stable, which is encouraging. She will follow-up with Dory Peru, RD as directed.   7. Radiation dermatitis:  The skin on her neck is very dry, but healing well.  She has been applying the Sonafine and hydrocortisone creams as needed.  I think her skin is itchy because it is so dry.  I encouraged her to consider switching to a lotion with Vitamin E now that her skin is more healed.  The added moisture and vitamin E will aid in her healing. She voiced understanding and agreed with this plan.      Dispo:   -Return to cancer center to see Survivorship NP in 1 month for continued close follow-up since completing treatment.   -Follow-up with Dr. [name] in 12/2015 with restaging PET scan.    A total of 30 minutes was spent in face-to-face care of this patient with greater than 50% of that time spent in counseling & care-coordination.   Mike Craze, NP [facility] [phone]

## 2015-10-03 ENCOUNTER — Ambulatory Visit: Payer: Medicare Other

## 2015-10-03 ENCOUNTER — Ambulatory Visit: Payer: Medicare Other | Attending: Radiation Oncology

## 2015-10-03 ENCOUNTER — Ambulatory Visit: Payer: Medicare Other | Admitting: Nutrition

## 2015-10-03 DIAGNOSIS — R1310 Dysphagia, unspecified: Secondary | ICD-10-CM | POA: Diagnosis not present

## 2015-10-03 NOTE — Progress Notes (Signed)
Nutrition follow-up completed with patient and her husband status post radiation therapy for cancer of the epiglottis. Final treatment was completed Thursday, August 3. Weight is relatively stable at 129 pounds. Patient is tolerating 5 cans of Osmolite 1.5 daily along with free water flushes. Tube feeding now meeting greater than 90% of estimated nutrition needs.  Estimated nutrition needs: 1700-2000 calories, 70-85 grams protein, 2 L fluid.  Nutrition diagnosis: Inadequate oral intake continues.  Intervention: Educated patient to begin drinking water and nonirritating fluids as well as soft solids on a regular basis. Encouraged her to continue Osmolite 1.5 - 5 cans daily until oral intake improves. Stressed the importance of her continuing swallowing exercises. Questions were answered and teach back method used.  Monitoring, evaluation, goals: Patient will tolerate an increase in oral intake to minimize tube feeding while promoting weight maintenance and healing.  Next visit: To be scheduled as needed.  **Disclaimer: This note was dictated with voice recognition software. Similar sounding words can inadvertently be transcribed and this note may contain transcription errors which may not have been corrected upon publication of note.**

## 2015-10-03 NOTE — Patient Instructions (Signed)
-  Water protocol- Three times a day AT LEAST, have 3-5 oz. Of water less than 30 minutes after careful oral care. SMALL sips Swallow hard   Pay close attention to those signs of aspiration pneumonia (below)  ====================================================================================================  Signs of Aspiration Pneumonia   . Chest pain/tightness . Fever (can be low grade) . Cough  o With foul-smelling phlegm (sputum) o With sputum containing pus or blood o With greenish sputum . Fatigue  . Shortness of breath  . Wheezing   **IF YOU HAVE THESE SIGNS, CONTACT YOUR DOCTOR OR GO TO THE EMERGENCY DEPARTMENT OR URGENT CARE AS SOON AS POSSIBLE**

## 2015-10-03 NOTE — Therapy (Signed)
[facility] [address] [place], [place], [address] Phone: [phone]   Fax:  [phone]  Speech Language Pathology Treatment  Patient Details  Name: Renee [name] MRN: [id number] Date of Birth: 1934-04-15 Referring Provider: Eppie Gibson MD  Encounter Date: 10/03/2015      End of Session - 10/03/15 1459    Visit Number 2   Number of Visits 4   Date for SLP Re-Evaluation 12/16/15   SLP Start Time [id number]   SLP Stop Time  1404   SLP Time Calculation (min) 36 min   Activity Tolerance Patient tolerated treatment well      Past Medical History:  Diagnosis Date  . Anxiety    takes Xanax as needed  . Arthritis   . Brain aneurysm   . Chronic back pain    buldging disc  . Chronic bronchitis ([place])   . Chronic diastolic CHF (congestive heart failure) ([facility])    was on Lasix but has been off for a few months  . Chronic respiratory failure ([place])   . Coronary artery disease    a. CABG 09/2009. b. normal myoview in 05/2012.   [name] Dysphagia   . Emphysema lung ([place])   . Emphysema/COPD ([place])    on home O2  . GERD (gastroesophageal reflux disease)    takes Nexium daily  . History of colon polyps    benign  . History of shingles   . History of vertigo    doesn't take any meds  . HLD (hyperlipidemia)    can't take meds d/t joint aches/pains  . Hypertension   . Hypothyroidism    takes Synthroid daily  . IBS (irritable bowel syndrome)   . Internal hemorrhoids   . Macular degeneration    dry  . Myocardial infarction [facility]) 2009   takes Eliquis daily.  . Orthostatic hypotension   . Osteoporosis   . PAF (paroxysmal atrial fibrillation) ([place])    a. Post op after CABG. Intolerant of amio in the past. b. Recurrence - started on Tikosyn 11/2013.  [name] Pneumonia    several yrs ago  . PONV (postoperative nausea and vomiting)   . RBBB    takes Metoporolol daily  . Sciatic nerve pain     Past Surgical History:  Procedure  Laterality Date  . APPENDECTOMY  1995  . ATRIAL FIBRILLATION ABLATION N/A 05/11/2014   Procedure: ATRIAL FIBRILLATION ABLATION;  Surgeon: Thompson Grayer, MD;  Location: [facility] CATH LAB;  Service: Cardiovascular;  Laterality: N/A;  . CARDIAC CATHETERIZATION  1990's; 06/2009  . CATARACT EXTRACTION W/ INTRAOCULAR LENS  IMPLANT, BILATERAL Bilateral   . CHOLECYSTECTOMY  2008  . colonosocpy    . CORONARY ARTERY BYPASS GRAFT  09/21/2009   CABG X2  . DIRECT LARYNGOSCOPY N/A 06/03/2015   Procedure: Direct laryngoscopy with biopsy left epiglottic lesion;  Surgeon: [name], MD;  Location: [facility];  Service: ENT;  Laterality: N/A;  . ESOPHAGOGASTRODUODENOSCOPY    . JOINT REPLACEMENT    . LARYNX SURGERY  ~ 1960   "tumor removed"  . RETINAL LASER PROCEDURE Left 10/19/2013   "had a wrinkle in it"  . TEE WITHOUT CARDIOVERSION N/A 05/10/2014   Procedure: TRANSESOPHAGEAL ECHOCARDIOGRAM (TEE);  Surgeon: Sueanne Margarita, MD;  Location: [facility] ENDOSCOPY;  Service: Cardiovascular;  Laterality: N/A;  . TONSILLECTOMY AND ADENOIDECTOMY    . TOTAL ABDOMINAL HYSTERECTOMY  1986  . TOTAL HIP ARTHROPLASTY Right 11/01/2008  . TUBAL LIGATION  1973    There  were no vitals filed for this visit.             ADULT SLP TREATMENT - 10/03/15 1336      General Information   Behavior/Cognition Alert;Cooperative;Pleasant mood     Treatment Provided   Treatment provided Dysphagia     Dysphagia Treatment   Temperature Spikes Noted No   Respiratory Status Room air  no wheezing/audible breathing noted   Oral Cavity - Dentition Dentures, top   Treatment Methods Therapeutic exercise;Skilled observation;Patient/caregiver education   Patient observed directly with PO's Yes   Type of PO's observed Thin liquids   Oral Phase Signs & Symptoms --  none reported   Pharyngeal Phase Signs & Symptoms Immediate cough   Other treatment/comments Pt c/o dry mouth especially in AMs, SLP suggested humidifier to run at night. Thick  saliva/phlegm also problematic for pt at this time making swallowing more challenging; clearing throat throughout session. With smaller sip sizes, pt with immediate cough approx 70% of the time. No other overt s/s aspiration PNA noted nor obsreved (other than wet voice/clearing throat throughout session - may be due to thick saliva/phlegm). Pt req'd occasional min-mod A with HEP. Pt/husband were educated re: water protocol and pt told to consume 3-5 oz. H2O x3/day within 30 minutes after careful oral care.      Pain Assessment   Pain Assessment 0-10   Pain Score 5    Pain Location throat   Pain Descriptors / Indicators Sore   Pain Intervention(s) Limited activity within patient's tolerance;Monitored during session;Premedicated before session     Assessment / Recommendations / Plan   Plan Continue with current plan of care     Dysphagia Recommendations   Diet recommendations Diet as tolerated - liquids H2O protocol-pt drinking H2O since before last rad day   Compensations Small sips/bites;Effortful swallow  with H2O protocol     Progression Toward Goals   Progression toward goals Progressing toward goals          SLP Education - 10/03/15 1458    Education provided Yes   Education Details late effects head/neck radiation on swallowing ability, HEP, signs/symptoms aspiration PNA, water protocol   Person(s) Educated Patient;Spouse   Methods Explanation;Demonstration;Verbal cues;Handout   Comprehension Verbalized understanding;Returned demonstration;Verbal cues required;Need further instruction          SLP Short Term Goals - 10/03/15 1504      SLP SHORT TERM GOAL #1   Title pt will demo HEP with rare min A   Time 3   Period --  therapy visits   Status On-going     SLP SHORT TERM GOAL #2   Title pt will tell SLP why she is completing HEP with modified independence over two therapy sessions   Time 2   Period --  therapy visits   Status Revised          SLP Long Term  Goals - 10/03/15 1506      SLP LONG TERM GOAL #1   Title pt will demo HEP with modified independnce over two sessions   Time 4   Period --  therapy sessions, for all LTGs (i.e., 5 sessions including eval)   Status On-going     SLP LONG TERM GOAL #2   Title pt will tell SLP how a food journal can assist her in returning to full and least restrictive PO diet   Time 4   Period --  visits   Status On-going     SLP  LONG TERM GOAL #3   Title pt will tell SLP 3 s/s aspiration PNA with modified independence   Time 4   Period --  visits   Status On-going          Plan - 10/03/15 1500    Clinical Impression Statement Pt demonstrated overt s/s pharyngeal dysphagia during the session today vs. complications from thick mucous/saliva and pharyngeal dysphagia. Modified barium swallow eval (Jul 01, 2015) revealed swallowing WFL/WNL. Pt would benefit from cont'd ST to assess proper completion of HEP as well as to assess for safety with P   Speech Therapy Frequency --  approx once every four weeks   Duration --  five total ST visits   Treatment/Interventions Aspiration precaution training;SLP instruction and feedback;Compensatory strategies;Pharyngeal strengthening exercises;Oral motor exercises;Trials of upgraded texture/liquids;Cueing hierarchy;Patient/family education  any or all will be targeted during ST sessions   Potential to Achieve Goals Good   Consulted and Agree with Plan of Care Patient      Patient will benefit from skilled therapeutic intervention in order to improve the following deficits and impairments:   Dysphagia    Problem List Patient Active Problem List   Diagnosis Date Noted  . Acute bronchitis 08/17/2015  . Carcinoma of epiglottis ([place] [place]) 06/24/2015  . Chronic obstructive pulmonary disease ([place]) 03/16/2015  . Moderate COPD (chronic obstructive pulmonary disease) ([place]) 03/16/2015  . COPD with chronic bronchitis ([place]) 12/13/2014  . Bibasilar crackles 12/13/2014   . Irritated throat 12/13/2014  . Chronic respiratory failure with hypoxia ([place]) 08/18/2014  . Cough 05/19/2014  . A-fib ([place]) 05/11/2014  . Orthostatic hypotension 01/18/2014  . HLD (hyperlipidemia)   . Hypertension   . Dizziness 11/30/2013  . Paroxysmal a-fib ([place]) 12/04/2012  . Palpitations 11/23/2011  . Hypothyroidism   . 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 IBS (irritable bowel syndrome)   . Bundle branch block, right   . Coronary artery disease   . Fatigue   . Bruises easily   . Joint pain   . Anxiety   . Chronic diastolic CHF (congestive heart failure) ([place]) 12/07/2009  . COPD (chronic obstructive pulmonary disease) ([place]) 12/07/2009  . GERD 06/27/2009  . PERSONAL HX COLONIC POLYPS 06/27/2009    [facility] ,MS, CCC-SLP  10/03/2015, 3:07 PM  [facility] [address] [place], [place], [address] Phone: [phone]   Fax:  [phone]   Name: Renee [name] MRN: [id number] Date of Birth: [birth year]

## 2015-10-03 NOTE — Addendum Note (Signed)
Addended by: Garald Balding B on: 10/03/2015 03:37 PM   Modules accepted: Orders

## 2015-10-12 ENCOUNTER — Telehealth: Payer: Self-pay | Admitting: Adult Health

## 2015-10-12 ENCOUNTER — Ambulatory Visit (HOSPITAL_BASED_OUTPATIENT_CLINIC_OR_DEPARTMENT_OTHER): Payer: Medicare Other | Admitting: Adult Health

## 2015-10-12 ENCOUNTER — Encounter (HOSPITAL_COMMUNITY): Payer: Self-pay | Admitting: Dentistry

## 2015-10-12 ENCOUNTER — Ambulatory Visit (HOSPITAL_COMMUNITY): Payer: Self-pay | Admitting: Dentistry

## 2015-10-12 ENCOUNTER — Encounter: Payer: Self-pay | Admitting: Adult Health

## 2015-10-12 VITALS — BP 136/71 | HR 86 | Temp 97.5°F | Wt 126.0 lb

## 2015-10-12 VITALS — BP 133/67 | HR 72 | Temp 97.8°F | Resp 18 | Ht 66.0 in | Wt 129.9 lb

## 2015-10-12 DIAGNOSIS — R300 Dysuria: Secondary | ICD-10-CM

## 2015-10-12 DIAGNOSIS — Z5189 Encounter for other specified aftercare: Secondary | ICD-10-CM | POA: Diagnosis not present

## 2015-10-12 DIAGNOSIS — N390 Urinary tract infection, site not specified: Secondary | ICD-10-CM | POA: Diagnosis not present

## 2015-10-12 DIAGNOSIS — R1310 Dysphagia, unspecified: Secondary | ICD-10-CM

## 2015-10-12 DIAGNOSIS — K1230 Oral mucositis (ulcerative), unspecified: Secondary | ICD-10-CM | POA: Diagnosis not present

## 2015-10-12 DIAGNOSIS — C321 Malignant neoplasm of supraglottis: Secondary | ICD-10-CM

## 2015-10-12 DIAGNOSIS — Z923 Personal history of irradiation: Secondary | ICD-10-CM

## 2015-10-12 DIAGNOSIS — R682 Dry mouth, unspecified: Secondary | ICD-10-CM

## 2015-10-12 DIAGNOSIS — R432 Parageusia: Secondary | ICD-10-CM

## 2015-10-12 DIAGNOSIS — K117 Disturbances of salivary secretion: Secondary | ICD-10-CM

## 2015-10-12 LAB — URINALYSIS, MICROSCOPIC - CHCC
Bilirubin (Urine): NEGATIVE
Blood: NEGATIVE
Glucose: NEGATIVE mg/dL
KETONES: NEGATIVE mg/dL
Nitrite: NEGATIVE
PROTEIN: NEGATIVE mg/dL
RBC / HPF: NEGATIVE (ref 0–2)
SPECIFIC GRAVITY, URINE: 1.01 (ref 1.003–1.035)
UROBILINOGEN UR: 0.2 mg/dL (ref 0.2–1)
pH: 6.5 (ref 4.6–8.0)

## 2015-10-12 MED ORDER — CIPROFLOXACIN HCL 250 MG PO TABS: 250.0000 mg | ORAL_TABLET | Freq: Two times a day (BID) | ORAL | 0 refills | Status: AC

## 2015-10-12 MED ORDER — HYDROCODONE-ACETAMINOPHEN 7.5-325 MG/15ML PO SOLN: 15.0000 mL | ORAL | 0 refills | Status: DC | PRN

## 2015-10-12 NOTE — Patient Instructions (Addendum)
RECOMMENDATIONS: 1. Brush after meals and at bedtime.  Use fluoride at bedtime. 2. Use trismus exercises as directed. 3. Use Biotene Rinse or salt water/baking soda rinses. 4. Multiple sips of water as needed. 5. Return to Dr. Ignatius Specking for follow-up in early November 2017.    Lenn Cal, DDS     TRISMUS  Trismus is a condition where the jaw does not allow the mouth to open as wide as it usually does.  This can happen almost suddenly, or in other cases the process is so slow, it is hard to notice it-until it is too far along.  When the jaw joints and/or muscles have been exposed to radiation treatments, the onset of Trismus is very slow.  This is because the muscles are losing their stretching ability over a long period of time, as long as 2 YEARS after the end of radiation.  It is therefore important to exercise these muscles and joints.  TRISMUS EXERCISES   Stack of tongue depressors measuring the same or a little less than the last documented MIO (Maximum Interincisal Opening).  Secure them with a rubber band on both ends.  Place the stack in the patient's mouth, supporting the other end.  Allow 30 seconds for muscle stretching.  Rest for a few seconds.  Repeat 3-5 times  For all radiation patients, this exercise is recommended in the mornings and evenings unless otherwise instructed.  The exercise should be done for a period of 2 YEARS after the end of radiation.  MIO should be checked routinely on recall dental visits by the general dentist or the hospital dentist.  The patient is advised to report any changes, soreness, or difficulties encountered when doing the exercises.

## 2015-10-12 NOTE — Progress Notes (Signed)
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 Direct visualization & possible biopsy recommended. No adenopathy.       05/20/2015 Imaging    CT neck: Nodularity of (L) epiglottis could represent a mass of polyp. No adenopathy.       06/03/2015 Initial Biopsy    Direct laryngoscopy [name]). Epiglottis biopsy: Squamous cell carcinoma.  (R) lateral wall larynx biopsy: Squamous cell carcinoma, moderately to poorly differentiated. p16 strongly (+) on both specimens.       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 3 sub-cm hypermetabolic (R) cervical LNs consistent with nodal  mets. 2 level 2 and 1 level 3 LN. No distant mets.       07/19/2015 - 09/08/2015 Radiation Therapy    IMRT [name]). The supraglottis and bilateral neck were treated to 70 Gy in 35 fractions. Intermediate risk nodal echelons were treated  to 63 Gy in 35 fractions.  Lower risk nodal echelons were treated to 56 Gy in 35 fractions        INTERVAL HISTORY:  Ms. Deist presents for acute concerns of recurrent UTI after having completed treatment with Cipro on 09/22/15.  She tells me that the burning with urination, as well as frequency started back a few days ago.  She denies any fevers, endorses occasional chills.  She thinks she needs more antibiotics.  She is also concerned about her left ear; it is not painful, but she states that it feels full and it sometimes makes her not feel good.  Her dizziness is less than it has been in previous weeks, but she thinks the inner ear issue in her left ear may be contributing to some intermittent dizzy feelings.    Otherwise, she feels like she is continuing to improve.  She is eating some solid foods, but not much; "everything tastes like cardboard."  She is doing tube feedings 4x/day with Osmolite1.5; feedings are going well.  Her pain is well-controlled with the Hycet 2-3x/day.  She takes 2 doses during the day and 1 dose at bedtime, which helps her not wake up in the middle of the night with pain.  She remains on the Diflucan for previously diagnosed oral candidiasis.  She continues to follow-up with speech therapy. She saw Dr. Enrique Sack in dentistry today for post-XRT evaluation; she tells me he encouraged her to keep working on her jaw exercises and the bite sticks to prevent trismus.  The skin on her neck is healing; she is applying lotion with Vitamin E.    She is here today with her husband; she is seated in a wheelchair.    ADDITIONAL REVIEW OF SYSTEMS:  Review of Systems  Constitutional: Positive for malaise/fatigue.       (+) fatigue; improving some   HENT: Positive for sore throat.        -Hycet alone controlled pain well.  -(L) ear fullness  Respiratory: Positive for cough and sputum production.        (+) phlegm production   Gastrointestinal: Positive for nausea.       -Occasional nausea -Bowels moving regularly  Genitourinary: Positive for dysuria and frequency. Negative for hematuria.  Musculoskeletal: Negative for falls.  Neurological: Positive for speech change. Negative for dizziness.       Voice hoarse    CURRENT MEDICATIONS:  Current Outpatient Prescriptions on File Prior to Visit  Medication Sig Dispense Refill  . acetaminophen (TYLENOL) 500 MG tablet Take 500 mg by mouth every 6 (six) hours as needed for moderate pain or headache.     . albuterol (PROAIR HFA) 108 (90 Base) MCG/ACT inhaler Inhale 2 puffs into the lungs every 6 (six) hours as needed for wheezing or shortness of breath.     . ALPRAZolam (XANAX) 0.25 MG tablet Take 0.125 mg by mouth 2 (two) times daily as  needed for anxiety or sleep.     [name] apixaban (ELIQUIS) 5 MG TABS tablet Take 1 tablet (5 mg total) by mouth 2 (two) times daily. 60 tablet 1  . cholecalciferol (VITAMIN D) 1000 UNITS tablet Take 1,000 Units by mouth daily.    [name] esomeprazole (NEXIUM) 40 MG capsule Take 40 mg by mouth daily before breakfast.      . fentaNYL (DURAGESIC - DOSED MCG/HR) 25 MCG/HR patch Place 1 patch (25 mcg total) onto the skin every 3 (three) days. (Patient not taking: Reported on 09/19/2015) 5 patch 0  . fluconazole (DIFLUCAN) 100 MG tablet Take 1 tablet (100 mg total) by mouth daily. 21 tablet 0  . HYDROcodone-acetaminophen (HYCET) 7.5-325 mg/15 ml solution Place 15 mLs into feeding tube every 4 (four) hours as needed for moderate pain (May take 60mL QHS and in the middle of the night.). 640 mL 0  . levothyroxine (SYNTHROID, LEVOTHROID) 25 MCG tablet Take 25 mcg by mouth daily before breakfast.     . lidocaine (XYLOCAINE) 2 % solution Mix 1 part 2%viscous lidocaine,1part H2O.Swish and/or swallow 72mL of this mixture,70min before meals and at bedtime, up to QID (Patient not taking: Reported on 08/29/2015) 100 mL 5  . metoprolol succinate (TOPROL-XL) 25 MG 24 hr tablet Take 1 tablet (25 mg total) by mouth daily.    . nitroGLYCERIN (NITROSTAT) 0.4 MG SL tablet Place 0.4 mg under the tongue every 5 (five) minutes x 3 doses as needed for chest pain.     . Nutritional Supplements (FEEDING SUPPLEMENT, OSMOLITE 1.5 CAL,) LIQD Give 1 can of Osmolite 1.5 or equivalent - 5 times daily with 60 cc free water before and after bolus feeding via PEG.  Give additional 240 cc water BID. Send formula and supplies. 1185 mL 0  . ondansetron (ZOFRAN) 4 MG tablet Take 1 tablet (4 mg total) by mouth every 8 (eight) hours as needed for nausea or vomiting. 50 tablet 3  . OXYGEN Inhale 2 L into the lungs as directed. As needed for shortness of breath    . Polyethyl Glycol-Propyl Glycol (SYSTANE OP) Place 1 drop into both eyes 2 (two) times daily.      . polyethylene glycol (MIRALAX / GLYCOLAX) packet Take 17 g by mouth daily as needed.    . senna (SENOKOT) 8.6 MG tablet Take 1 tablet by mouth daily as needed for constipation.     . sodium fluoride (FLUORISHIELD) 1.1 % GEL dental gel Instill one drop of gel per tooth space of fluoride tray. Place over teeth for 5 minutes. Remove. Spit out excess. Repeat nightly. 120 mL prn  . [DISCONTINUED] budesonide-formoterol (SYMBICORT) 160-4.5 MCG/ACT inhaler Inhale 2 puffs into the lungs 2 (two) times daily. 1 Inhaler 12  . [DISCONTINUED] calcium citrate-vitamin  D (CITRACAL+D) 315-200 MG-UNIT per tablet Take 1 tablet by mouth 2 (two) times daily.       No current facility-administered medications on file prior to visit.     ALLERGIES:  Allergies  Allergen Reactions  . Levofloxacin Nausea Only    Unable to tolerate more than 7 days  . Penicillins Swelling and Rash    Swelling location undefined. Has patient had a PCN reaction causing immediate rash, facial/tongue/throat swelling, SOB or lightheadedness with hypotension: Yes Has patient had a PCN reaction causing severe rash involving mucus membranes or skin necrosis: No Has patient had a PCN reaction that required hospitalization Unk Has patient had a PCN reaction occurring within the last 10 years: Unk If all of the above answers are "NO", then may proceed with Cephalosporin use.   [name] Spiriva [Tiotropium Bromide Monohydrate] Other (See Comments)    Throat irritation  . Statins Other (See Comments)    Myalgias  . Budesonide-Formoterol Fumarate Other (See Comments)    Tachycardia  . Morphine Other (See Comments)    double vision  . Phenazopyridine Hcl Other (See Comments)    *piridium* face red  . Pneumococcal Vaccines Swelling    [place] of the skin  . Prednisone Swelling    Can't take PO but injectable is fine  . Sulfa Antibiotics Rash  . Sulfonamide Derivatives Rash     PHYSICAL EXAM:  Vitals: Vitals:   10/12/15 1340  BP: 133/67   Pulse: 72  Resp: 18  Temp: 97.8 F (36.6 C)    Weight Date  129 lb 14.4 oz (58.9 kg) 10/12/15  129 lb 1.6 oz (58.6 kg) 09/29/15  127 lb 14.4 oz (58 kg) 09/19/15  131 lb 8 oz (59.6 kg) 09/05/15  129 lb 8 oz (58.7 kg) 08/29/15  126 lb 12.8 oz (57.516 kg) 08/26/15  126 lb 14.4 oz (57.561 kg) 08/22/15  127 lb 12.8 oz (57.97 kg) 08/10/15  129 lb 4.8 oz (58.65 kg) 08/05/15  132 lb 1.6 oz (59.92 kg) 08/01/15  128 lb 1.6 oz (58.106 kg) 07/25/15  129 lb 1.6 oz (58.559 kg) Pre-treatment: 06/22/15   General: Female in no acute distress. Accompanied by her husband today.  Seen seated in wheelchair. HEENT: Head normocephalic.  Pupils equal and reactive to light. Conjunctivae clear without exudate.  Sclerae anicteric. Oral mucosa is pink; dentures in place. Saliva thick. Tongue is moist; (+) thrush on tongue (improving). Posterior oropharynx is mildly erythematous. (L) TM visualized; evidence of possible middle ear effusion; no obvious evidence of infection; unable to visualize (R) TM d/t cerumen.  Voice quality is improving.  Lymph: No appreciable cervical, supraclavicular, or infraclavicular lymphadenopathy noted on palpation.  Neck: Skin on neck dry and mildly hyperpigmented; progressing towards healing.   Cardiovascular: Regular rate and regular rhythm.  Respiratory: Bilat upper lobes with slight rhonchi. Patient coughing intermittently.  Bases diminished.  Breathing non-labored. GI: Abdomen soft and round. Bowel sounds normoactive. G-tube in place.  GU: Deferred.   Neuro: No focal deficits.  Psych: Normal mood and affect for situation. Extremities: No edema. Skin: Warm and dry.    LABORATORY DATA:   Urinalysis 10/12/15   Urine culture from 09/19/15   DIAGNOSTIC IMAGING:  None at this visit.    ASSESSMENT & PLAN:  Ms. Scheler is a pleasant 81 y.o. 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 She [facility] for  routine follow-up to address any acute concerns  since completing treatment.   1. Cancer of the epiglottis:  Ms. Wehri is continuing to recover from definitive radiation therapy treatment for head & neck cancer. Clinically, she continues to improve quite well.  I will see her in about 3 weeks for continued follow-up. She will see Dr. [name] in 12/2015 with restaging PET scan.   2. Likely recurrent Klebsiella pneumoniae UTI: She recently completed treatment for UTI with Cipro, (which was switched from [facility] after she received a call from Dr. [name] & the prescription was changed based on the urine culture susceptibility). Per patient, she completed 5 days of BID dosing Cipro on 09/22/15; she administered the Cipro by crushing it and putting it in her feeding tube along with her tube feedings at the same time.  Her symptoms have now recurred, suggesting incomplete response to initial antimicrobial therapy.  Her UA today also continues to show bacteria and moderate leukocyte esterase.   According to Up-to-Date, Cipro should not be administered with enteral feedings; "feedings should be discontinued for 1-2 hours prior to and after Cipro administration, as there is >30% inhibition of absorption of the Cipro with enteral feedings."  She is not taking any calcium or antiacid supplementation, which could further decrease absorption; I provided education today and discouraged her from doing this while taking the Cipro.   Given that Levofloxacin causes GI upset for her, she was willing to complete another 5-day course of Cipro 250 mg BID.  I gave her specific instructions to take the Cipro at 10 am and 10 pm daily, (given that her current feeding schedule is at 8am, 12pm, 5pm, and 11pm).  On this schedule, it would be fine to administer the Cipro via tube after being crushed and mixed with warm water.  Since she will be taking the Cipro on a mostly empty stomach, I encouraged her to pre-medicate with her nausea  medication before taking the Cipro to help with any possible associated GI symptoms.  This administration schedule will allow for more improved absorption and hopefully eliminate the UTI.   She knows to call me with any concerns.   3. Oral candidiasis, improving: Ms. Zinda will continue on her current course of Diflucan.  I reinforced the importance and purpose of this medication today.   4. Dysphagia/Pain management:  Ms. Eichelberger has some dysphagia; she continues to see Garald Balding, SLP, as appropriate.  I encouraged her to continue to work on her swallowing exercises, as instructed by [name].  She will continue to follow-up with him as recommended.  For pain, the current pain medication regiment of Hycet alone is adequate.  Below is the information available from the [place] Controlled Substance Reporting System for this patient.  She is not receiving prescriptions from multiple providers and remains compliant in her pain treatment plan.  Continued acute pain management is necessary as she recovers from radiation therapy. Therefore, I have refilled her Hycet today and a prescription was given to her for: Hydrocodone/APAP 7.5/325 mg solution, po/VT Q4Hprn, #666mL, no refills.      5. Protein-calorie malnutrition, stable: She is tolerating her tube feedings well. Her weight is stable, which is encouraging. She will follow-up with Dory Peru, RD as needed/directed.      Dispo:   -Return to cancer center to see Survivorship NP on 10/31/15 for continued follow-up.  -Follow-up with Dr. [name] in 12/2015 with restaging PET scan.    A total of 30 minutes was spent in face-to-face care of this patient with greater than  50% of that time spent in counseling & care-coordination.    Mike Craze, NP [place] [phone]

## 2015-10-12 NOTE — Progress Notes (Signed)
10/12/2015  Patient Name:   Renee [name] Date of Birth:   20-May-1934 Medical Record Number: [id number]  BP 136/71 (BP Location: Left Arm)   Pulse 86   Temp 97.5 F (36.4 C) (Oral)   Wt 126 lb (57.2 kg)   BMI 20.34 kg/m   [name] presents for oral examination after radiation therapy. Patient has completed all radiation treatments from 07/19/15 thru 09/08/15.  REVIEW OF CHIEF COMPLAINTS:  DRY MOUTH: Yes HARD TO SWALLOW: Yes, at times.  HURT TO SWALLOW: Yes, at times. TASTE CHANGES: "Everything taste-like cardboard" SORES IN MOUTH: No TRISMUS: No symptoms. WEIGHT: 126 pounds  HOME OH REGIMEN:  BRUSHING: Twice a day.  FLOSSING: Daily RINSING: Not really rinsing with anything. FLUORIDE: Using fluoride at bedtime. TRISMUS EXERCISES:  Maximum interincisal opening: 25 mm. Patient is trying to increase maximum interincisal opening with increased trismus exercises.   DENTAL EXAM:  Oral Hygiene:(PLAQUE): Some plaque noted. Oral hygiene improvement was highly suggested. LOCATION OF MUCOSITIS: None noted DESCRIPTION OF SALIVA: Decreased saliva. Mild xerostomia. ANY EXPOSED BONE: None noted OTHER WATCHED AREAS: Decreased maximum interincisal opening. DX: Xerostomia, Dysgeusia, Dysphagia, Odynophagia and Accretions  RECOMMENDATIONS: 1. Brush after meals and at bedtime.  Use fluoride at bedtime. 2. Use trismus exercises as directed. 3. Use Biotene Rinse or salt water/baking soda rinses. 4. Multiple sips of water as needed. 5. Return to Dr. Ignatius Specking for follow-up in early November or December of 2017.    Lenn Cal, DDS

## 2015-10-12 NOTE — Patient Instructions (Signed)
-  Start the Cipro as soon as you are able for the UTI.  If there are any additional concerns from the urine sample when I get the results, I will let you know (although I am not anticipating this).    *Take the Cipro at 10 am and 10 pm so as not to interfere with your tube feedings and the calcium.   -You can refill the pain medication whenever you need to; prescription given today.    Call me with any questions or concerns!  See you in a few weeks! Mike Craze, NP [place] [phone]

## 2015-10-12 NOTE — Telephone Encounter (Signed)
I received a call from the patient's husband, who shared with me that Renee [name] thinks her UTI is "back."  She is having burning with urination again; she also is having some (L) ear fullness and wants these things to be evaluated today.    Our Symptom Management NP is not in the office today, so I will plan on bringing the patient in and evaluating her concerns.  Ms. Nooney has a post-XRT dentist appt today with Dr. Enrique Sack at 12:45pm; they would like to come to the cancer center after that visit, which will be fine.    I will see her this afternoon.   Mike Craze, NP [place] [phone]

## 2015-10-27 ENCOUNTER — Telehealth: Payer: Self-pay | Admitting: *Deleted

## 2015-10-27 NOTE — Telephone Encounter (Signed)
Oncology Nurse Navigator Documentation  Spoke with Ms. Rayl and her husband, confirmed their understanding of 10/2 1400 appt with SLP Garald Balding.  They voiced understanding.  They confirmed understanding of 9/25 1300 appt with Survivorship NP Mike Craze.  [name], RN, BSN, [place] at [place] [phone]

## 2015-10-28 MED FILL — FLUORISHIELD 1.1% GEL: 1.1 % | 30 days supply | Qty: 114 | Fill #1

## 2015-10-31 ENCOUNTER — Telehealth: Payer: Self-pay | Admitting: Adult Health

## 2015-10-31 ENCOUNTER — Encounter: Payer: Self-pay | Admitting: Adult Health

## 2015-10-31 ENCOUNTER — Ambulatory Visit (HOSPITAL_BASED_OUTPATIENT_CLINIC_OR_DEPARTMENT_OTHER): Payer: Medicare Other | Admitting: Adult Health

## 2015-10-31 VITALS — BP 113/54 | HR 82 | Temp 97.6°F | Resp 18 | Wt 131.3 lb

## 2015-10-31 DIAGNOSIS — C321 Malignant neoplasm of supraglottis: Secondary | ICD-10-CM | POA: Diagnosis not present

## 2015-10-31 DIAGNOSIS — Z23 Encounter for immunization: Secondary | ICD-10-CM

## 2015-10-31 MED ORDER — INFLUENZA VAC SPLIT QUAD 0.5 ML IM SUSY
0.5000 mL | PREFILLED_SYRINGE | Freq: Once | INTRAMUSCULAR | Status: AC
Start: 2015-10-31 — End: 2015-10-31
  Administered 2015-10-31: 0.5 mL via INTRAMUSCULAR
  Filled 2015-10-31: qty 0.5

## 2015-10-31 MED ORDER — HYDROCODONE-ACETAMINOPHEN 7.5-325 MG/15ML PO SOLN: 15.0000 mL | ORAL | 0 refills | Status: DC | PRN

## 2015-10-31 NOTE — Progress Notes (Signed)
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 Direct visualization & possible biopsy recommended. No adenopathy.       05/20/2015 Imaging    CT neck: Nodularity of (L) epiglottis could represent a mass of polyp. No adenopathy.       06/03/2015 Initial Biopsy    Direct laryngoscopy [name]). Epiglottis biopsy: Squamous cell carcinoma.  (R) lateral wall larynx biopsy: Squamous cell carcinoma, moderately to poorly differentiated. p16 strongly (+) on both specimens.       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 3 sub-cm hypermetabolic (R) cervical LNs consistent with nodal  mets. 2 level 2 and 1 level 3 LN. No distant mets.       07/19/2015 - 09/08/2015 Radiation Therapy    IMRT [name]). The supraglottis and bilateral neck were treated to 70 Gy in 35 fractions. Intermediate risk nodal echelons were treated  to 63 Gy in 35 fractions.  Lower risk nodal echelons were treated to 56 Gy in 35 fractions        INTERVAL HISTORY:  Ms. Toczek presents for routine follow-up for H&N cancer.  Her taste is improving; she was able to have some pinto beans and banana pudding recently which she was able to taste.  She still feels like food gets "stuck" at times; she continues to do her speech therapy/swallowing exercises.  She did have an episode where she got choked on a pill, because "it went down long-ways in my throat and got caught."  She feels like her swallow is improving overall though.  She feels like her voice is getting better too.  Her throat pain is about 3/10; she takes the Hycet 5 mL BID, then she takes 10-15 mL at  bedtime; this regimen helps manage her pain.  She feels like her (L) ear is "full" and there is occasional pain and dizziness with certain movements.  She is concerned about her G-tube site and wants me to take a look at it today.    She has no further UTI symptoms.  Her oral thrush symptoms are gone as well.  She is back at church and has been "out and about" more with her husband in recent weeks.  They have an upcoming wedding [place] in mid-October that she is looking forward to attending; she is requesting a refill of her Hycet today "just in case."    She is here today with her husband; she is seated in a wheelchair.    ADDITIONAL REVIEW OF SYSTEMS:  Review of Systems  Constitutional: Positive for malaise/fatigue.       (+) fatigue; improving some   HENT: Positive for sore throat.        -Hycet alone controlled pain well.  -(L) ear fullness  Respiratory: Positive for cough and sputum production.        (+) phlegm production   Gastrointestinal: Positive for nausea.       -Occasional nausea -Bowels moving regularly   Genitourinary: Positive for dysuria and frequency. Negative for hematuria.  Musculoskeletal: Negative for falls.  Neurological: Positive for speech change.  Voice hoarse    CURRENT MEDICATIONS:  Current Outpatient Prescriptions on File Prior to Visit  Medication Sig Dispense Refill  . acetaminophen (TYLENOL) 500 MG tablet Take 500 mg by mouth every 6 (six) hours as needed for moderate pain or headache.     . albuterol (PROAIR HFA) 108 (90 Base) MCG/ACT inhaler Inhale 2 puffs into the lungs every 6 (six) hours as needed for wheezing or shortness of breath.     . ALPRAZolam (XANAX) 0.25 MG tablet Take 0.125 mg by mouth 2 (two) times daily as needed for anxiety or sleep.     [name] apixaban (ELIQUIS) 5 MG TABS tablet Take 1 tablet (5 mg total) by mouth 2 (two) times daily. 60 tablet 1  . cholecalciferol (VITAMIN D) 1000 UNITS tablet Take 1,000 Units by mouth daily.    [name]  esomeprazole (NEXIUM) 40 MG capsule Take 40 mg by mouth daily before breakfast.      . fentaNYL (DURAGESIC - DOSED MCG/HR) 25 MCG/HR patch Place 1 patch (25 mcg total) onto the skin every 3 (three) days. (Patient not taking: Reported on 09/19/2015) 5 patch 0  . HYDROcodone-acetaminophen (HYCET) 7.5-325 mg/15 ml solution Place 15 mLs into feeding tube every 4 (four) hours as needed for moderate pain (May take 26mL QHS and in the middle of the night.). 640 mL 0  . levothyroxine (SYNTHROID, LEVOTHROID) 25 MCG tablet Take 25 mcg by mouth daily before breakfast.     . lidocaine (XYLOCAINE) 2 % solution Mix 1 part 2%viscous lidocaine,1part H2O.Swish and/or swallow 84mL of this mixture,24min before meals and at bedtime, up to QID (Patient not taking: Reported on 08/29/2015) 100 mL 5  . metoprolol succinate (TOPROL-XL) 25 MG 24 hr tablet Take 1 tablet (25 mg total) by mouth daily.    . nitroGLYCERIN (NITROSTAT) 0.4 MG SL tablet Place 0.4 mg under the tongue every 5 (five) minutes x 3 doses as needed for chest pain.     . Nutritional Supplements (FEEDING SUPPLEMENT, OSMOLITE 1.5 CAL,) LIQD Give 1 can of Osmolite 1.5 or equivalent - 5 times daily with 60 cc free water before and after bolus feeding via PEG.  Give additional 240 cc water BID. Send formula and supplies. 1185 mL 0  . ondansetron (ZOFRAN) 4 MG tablet Take 1 tablet (4 mg total) by mouth every 8 (eight) hours as needed for nausea or vomiting. 50 tablet 3  . OXYGEN Inhale 2 L into the lungs as directed. As needed for shortness of breath    . Polyethyl Glycol-Propyl Glycol (SYSTANE OP) Place 1 drop into both eyes 2 (two) times daily.     . polyethylene glycol (MIRALAX / GLYCOLAX) packet Take 17 g by mouth daily as needed.    . senna (SENOKOT) 8.6 MG tablet Take 1 tablet by mouth daily as needed for constipation.     . sodium fluoride (FLUORISHIELD) 1.1 % GEL dental gel Instill one drop of gel per tooth space of fluoride tray. Place over teeth for 5  minutes. Remove. Spit out excess. Repeat nightly. 120 mL prn  . [DISCONTINUED] budesonide-formoterol (SYMBICORT) 160-4.5 MCG/ACT inhaler Inhale 2 puffs into the lungs 2 (two) times daily. 1 Inhaler 12  . [DISCONTINUED] calcium citrate-vitamin D (CITRACAL+D) 315-200 MG-UNIT per tablet Take 1 tablet by mouth 2 (two) times daily.       No current facility-administered medications on file prior to visit.     ALLERGIES:  Allergies  Allergen Reactions  . Levofloxacin Nausea Only  Unable to tolerate more than 7 days  . Penicillins Swelling and Rash    Swelling location undefined. Has patient had a PCN reaction causing immediate rash, facial/tongue/throat swelling, SOB or lightheadedness with hypotension: Yes Has patient had a PCN reaction causing severe rash involving mucus membranes or skin necrosis: No Has patient had a PCN reaction that required hospitalization Unk Has patient had a PCN reaction occurring within the last 10 years: Unk If all of the above answers are "NO", then may proceed with Cephalosporin use.   [name] Spiriva [Tiotropium Bromide Monohydrate] Other (See Comments)    Throat irritation  . Statins Other (See Comments)    Myalgias  . Budesonide-Formoterol Fumarate Other (See Comments)    Tachycardia  . Morphine Other (See Comments)    double vision  . Phenazopyridine Hcl Other (See Comments)    *piridium* face red  . Pneumococcal Vaccines Swelling    [place] of the skin  . Prednisone Swelling    Can't take PO but injectable is fine  . Sulfa Antibiotics Rash  . Sulfonamide Derivatives Rash     PHYSICAL EXAM:  Vitals: Vitals:   10/31/15 1318  BP: (!) 113/54  Pulse: 82  Resp: 18  Temp: 97.6 F (36.4 C)   Filed Weights   10/31/15 1318  Weight: 131 lb 4.8 oz (59.6 kg)    Weight Date  113 lb 4.8 oz (59.6 kg) 10/31/15  129 lb 14.4 oz (58.9 kg) 10/12/15  129 lb 1.6 oz (58.6 kg) 09/29/15  127 lb 14.4 oz (58 kg) 09/19/15  131 lb 8 oz (59.6 kg) 09/05/15  129 lb 8 oz  (58.7 kg) 08/29/15  126 lb 12.8 oz (57.516 kg) 08/26/15  126 lb 14.4 oz (57.561 kg) 08/22/15  127 lb 12.8 oz (57.97 kg) 08/10/15  129 lb 4.8 oz (58.65 kg) 08/05/15  132 lb 1.6 oz (59.92 kg) 08/01/15  128 lb 1.6 oz (58.106 kg) 07/25/15  129 lb 1.6 oz (58.559 kg) Pre-treatment: 06/22/15   General: Female in no acute distress. Accompanied by her husband today.  Seen seated in wheelchair. HEENT: Head normocephalic.  Pupils equal and reactive to light. Conjunctivae clear without exudate.  Sclerae anicteric. Oral mucosa is pink; dentures in place. Unable to visualize bilat TMs (otoscope unavailable). Voice quality is improving.  Lymph: No appreciable cervical, supraclavicular, or infraclavicular lymphadenopathy noted on palpation.  Neck: Skin on neck dry and mildly hyperpigmented; further improved from previous.   Cardiovascular: Regular rate and regular rhythm.  Respiratory: Clear to auscultation bilaterally.  Breathing non-labored. GI: Abdomen soft and round. Bowel sounds normoactive. G-tube in place; dressing clean, dry, and intact.  GU: Deferred.   Neuro: No focal deficits.  Psych: Normal mood and affect for situation. Extremities: No edema. Skin: Warm and dry.    LABORATORY DATA:  None for this visit.   DIAGNOSTIC IMAGING:  None at this visit.    ASSESSMENT & PLAN:  Ms. Lasek is a pleasant 81 y.o. 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 She presents to the Survivorship Clinic today for routine follow-up to address any acute concerns since completing treatment.   1. Cancer of the epiglottis:  Ms. Wilkens is continuing to recover from definitive radiation therapy treatment for head & neck cancer. Clinically, she continues to improve quite well.  I offered to extend her follow-up until after her PET scan and she will see Dr. [name] to get those results at the end of November,  but the patient prefers for me to see her one more visit  before her PET scan.  Therefore, I will see her in about 1 month for continued follow-up. She will see Dr. [name] in 12/2015 with restaging PET scan. (Note: I will follow-up with Rad Onc scheduling, as PET scan is not scheduled yet).   2. Dysphagia/Pain management:  Ms. Yardley has some dysphagia; she continues to see Garald Balding, SLP, as appropriate.  I encouraged her to continue to work on her swallowing exercises, as instructed by [name].  She will continue to follow-up with him as recommended.  For pain, the current pain medication regiment of Hycet alone is adequate.  Below is the information available from the [place] Controlled Substance Reporting System for this patient.  She is not receiving prescriptions from multiple providers and remains compliant in her pain treatment plan.  Continued acute pain management is necessary as she recovers from radiation therapy. Therefore, I have refilled her Hycet today and a prescription was given to her for: Hydrocodone/APAP 7.5/325 mg solution, po/VT Q4Hprn, #753mL, no refills. (additional mL provided from previous prescription to support their upcoming out-of-state trip)     5. Protein-calorie malnutrition, stable: She is tolerating her tube feedings well. Her weight is increased a bit, which is encouraging. She will follow-up with Dory Peru, RD as needed/directed.      Dispo:   -Return to cancer center to see Survivorship NP on 12/12/15 for continued follow-up.  -Follow-up with Dr. [name] in 12/2015 with restaging PET scan.    A total of 30 minutes was spent in face-to-face care of this patient with greater than 50% of that time spent in counseling & care-coordination.    Mike Craze, NP [place] [phone]

## 2015-10-31 NOTE — Telephone Encounter (Signed)
Encounter opened in error. Please disregard.  Mike Craze, NP [place] [phone]

## 2015-11-07 ENCOUNTER — Ambulatory Visit: Payer: Medicare Other

## 2015-11-07 ENCOUNTER — Ambulatory Visit: Payer: Medicare Other | Attending: Radiation Oncology

## 2015-11-07 DIAGNOSIS — R1319 Other dysphagia: Secondary | ICD-10-CM | POA: Diagnosis not present

## 2015-11-07 NOTE — Therapy (Signed)
[facility] [address] [place], [place], [address] Phone: [phone]   Fax:  [phone]  Speech Language Pathology Treatment  Patient Details  Name: Renee [name] MRN: [id number] Date of Birth: 06/19/1934 Referring Provider: Eppie Gibson MD  Encounter Date: 11/07/2015      End of Session - 11/07/15 1538    Visit Number 3   Number of Visits 4   Date for SLP Re-Evaluation 12/16/15   SLP Start Time 1410   SLP Stop Time  1447   SLP Time Calculation (min) 37 min      Past Medical History:  Diagnosis Date  . Anxiety    takes Xanax as needed  . Arthritis   . Brain aneurysm   . Chronic back pain    buldging disc  . Chronic bronchitis ([facility])   . Chronic diastolic CHF (congestive heart failure) ([facility])    was on Lasix but has been off for a few months  . Chronic respiratory failure ([place])   . Coronary artery disease    a. CABG 09/2009. b. normal myoview in 05/2012.   [name] Dysphagia   . Emphysema lung ([place])   . Emphysema/COPD ([place])    on home O2  . GERD (gastroesophageal reflux disease)    takes Nexium daily  . History of colon polyps    benign  . History of shingles   . History of vertigo    doesn't take any meds  . HLD (hyperlipidemia)    can't take meds d/t joint aches/pains  . Hypertension   . Hypothyroidism    takes Synthroid daily  . IBS (irritable bowel syndrome)   . Internal hemorrhoids   . Macular degeneration    dry  . Myocardial infarction 2009   takes Eliquis daily.  . Orthostatic hypotension   . Osteoporosis   . PAF (paroxysmal atrial fibrillation) ([place])    a. Post op after CABG. Intolerant of amio in the past. b. Recurrence - started on Tikosyn 11/2013.  [name] Pneumonia    several yrs ago  . PONV (postoperative nausea and vomiting)   . RBBB    takes Metoporolol daily  . Sciatic nerve pain     Past Surgical History:  Procedure Laterality Date  . APPENDECTOMY  1995  . ATRIAL  FIBRILLATION ABLATION N/A 05/11/2014   Procedure: ATRIAL FIBRILLATION ABLATION;  Surgeon: Thompson Grayer, MD;  Location[facility] C[facility] LAB;  Service: Cardiovascular;  Laterality: N/A;  . CARDIAC CATHETERIZATION  1990's; 06/2009  . CATARACT EXTRACTION W/ INTRAOCULAR LENS  IMPLANT, BILATERAL Bilateral   . CHOLECYSTECTOMY  2008  . colonosocpy    . CORONARY ARTERY BYPASS GRAFT  09/21/2009   CABG X2  . DIRECT LARYNGOSCOPY N/A 06/03/2015   Procedure: Direct laryngoscopy with biopsy left epiglottic lesion;  Surgeon: [name], MD;  Location: [facility];  Service: ENT;  Laterality: N/A;  . ESOPHAGOGASTRODUODENOSCOPY    . JOINT REPLACEMENT    . LARYNX SURGERY  ~ 1960   "tumor removed"  . RETINAL LASER PROCEDURE Left 10/19/2013   "had a wrinkle in it"  . TEE WITHOUT CARDIOVERSION N/A 05/10/2014   Procedure: TRANSESOPHAGEAL ECHOCARDIOGRAM (TEE);  Surgeon: Sueanne Margarita, MD;  Location[facility] ENDOSCOPY;  Service: Cardiovascular;  Laterality: N/A;  . TONSILLECTOMY AND ADENOIDECTOMY    . TOTAL ABDOMINAL HYSTERECTOMY  1986  . TOTAL HIP ARTHROPLASTY Right 11/01/2008  . TUBAL LIGATION  1973    There were no vitals filed for this visit.  Subjective Assessment - 11/07/15 1404    Subjective Pt reports her "choking" only happens in the mornings now.    Patient is accompained by: --  Husband, Renee [name]               ADULT SLP TREATMENT - 11/07/15 1406      General Information   Behavior/Cognition Alert;Cooperative;Pleasant mood     Treatment Provided   Treatment provided Dysphagia     Dysphagia Treatment   Temperature Spikes Noted No   Respiratory Status Room air   Oral Cavity - Dentition Dentures, top   Treatment Methods Therapeutic exercise;Skilled observation;Patient/caregiver education   Patient observed directly with PO's Yes   Type of PO's observed Thin liquids;Dysphagia 1 (puree)   Oral Phase Signs & Symptoms --  none noted   Pharyngeal Phase Signs & Symptoms Immediate cough;Wet vocal quality  1/9  swallows (cough); delayed wet voice (phlegm?)   Other treatment/comments Pt began session with SLP skilled observation of POs: first sip of H2O with immediate cough; 1/3 teaspoons applesauce with liquid wash were without overt s/s aspiration, 1/2 -3/4 teaspoon applesauce with liquid wash without overt s/s aspriation. SLP told pt to eat pureed or thinner items (e.g., creamed soups,) at least twice a day, at least 1/2 cup amounts.          SLP Education - 11/07/15 1537    Education provided Yes   Education Details HEP, late effects head/neck radiation on swallowing   Person(s) Educated Patient;Spouse   Methods Explanation;Demonstration;Verbal cues;Handout   Comprehension Verbalized understanding;Returned demonstration;Verbal cues required          SLP Short Term Goals - 11/07/15 1442      SLP SHORT TERM GOAL #1   Title pt will demo HEP with rare min A   Time 2   Period --  therapy visits   Status On-going     SLP SHORT TERM GOAL #2   Title pt will tell SLP why she is completing HEP with modified independence over two therapy sessions   Baseline one session 11-07-15   Time 1   Period --  therapy visits   Status Revised          SLP Long Term Goals - 11/07/15 1447      SLP LONG TERM GOAL #1   Title pt will demo HEP with modified independnce over two sessions   Time 3   Period --  therapy sessions, for all LTGs (i.e., 5 sessions including eval)   Status On-going     SLP LONG TERM GOAL #2   Title pt will tell SLP how a food journal can assist her in returning to full and least restrictive PO diet   Time 3   Period --  visits   Status On-going     SLP LONG TERM GOAL #3   Title pt will tell SLP 3 s/s aspiration PNA with modified independence   Time 3   Period --  visits   Status On-going          Plan - 11/07/15 1539    Clinical Impression Statement Pt demonstrated minimal overt s/s pharyngeal dysphagia during the session today. Pt to attempt more POs and  more of HEP more regularly. Pt would cont to benefit from cont'd ST to assess proper completion of HEP as well as to assess for safety with P   Speech Therapy Frequency --  approx every 4 weeks   Duration --  1 more visits (prior  to 12/16/15   Treatment/Interventions Aspiration precaution training;SLP instruction and feedback;Compensatory strategies;Pharyngeal strengthening exercises;Oral motor exercises;Trials of upgraded texture/liquids;Cueing hierarchy;Patient/family education   Potential to Achieve Goals Good   Consulted and Agree with Plan of Care Patient      Patient will benefit from skilled therapeutic intervention in order to improve the following deficits and impairments:   Other dysphagia    Problem List Patient Active Problem List   Diagnosis Date Noted  . Acute bronchitis 08/17/2015  . Carcinoma of epiglottis ([place]) 06/24/2015  . Chronic obstructive pulmonary disease ([place]) 03/16/2015  . Moderate COPD (chronic obstructive pulmonary disease) ([place]) 03/16/2015  . COPD with chronic bronchitis ([place]) 12/13/2014  . Bibasilar crackles 12/13/2014  . Irritated throat 12/13/2014  . Chronic respiratory failure with hypoxia ([place]) 08/18/2014  . Cough 05/19/2014  . A-fib ([place]) 05/11/2014  . Orthostatic hypotension 01/18/2014  . HLD (hyperlipidemia)   . Hypertension   . Dizziness 11/30/2013  . Paroxysmal a-fib ([place]) 12/04/2012  . Palpitations 11/23/2011  . Hypothyroidism   . 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 IBS (irritable bowel syndrome)   . Bundle branch block, right   . Coronary artery disease   . Fatigue   . Bruises easily   . Joint pain   . Anxiety   . Chronic diastolic CHF (congestive heart failure) ([place]) 12/07/2009  . COPD (chronic obstructive pulmonary disease) ([place]) 12/07/2009  . GERD 06/27/2009  . PERSONAL HX COLONIC POLYPS 06/27/2009    [facility] ,MS, CCC-SLP  11/07/2015, 3:40 PM  [facility] [address] [place], [place], [address] Phone: [phone]   Fax:  [phone]   Name: Renee [name] MRN: [id number] Date of Birth: May 31, 1934

## 2015-11-07 NOTE — Patient Instructions (Signed)
Start with 10 repetitions of each of exercse 1,2, and 5. Then as you get stronger, increase the number to 15, then by 5 more until you reach 30 repetitions. Eat something at least twice a day, at least 1/2 cup each "meal."

## 2015-11-09 ENCOUNTER — Telehealth: Payer: Self-pay | Admitting: *Deleted

## 2015-11-09 NOTE — Telephone Encounter (Signed)
Oncology Nurse Navigator Documentation  Spoke with Renee [name] in follow-up to message from Survivorship NP Mike Craze re appt with ENT [name].  She indicated she contacted his office yesterday, is waiting to hear back re appt.  Renee Orem, RN, BSN, [place] at [place] [phone]

## 2015-11-14 ENCOUNTER — Other Ambulatory Visit: Payer: Self-pay | Admitting: *Deleted

## 2015-11-14 ENCOUNTER — Telehealth: Payer: Self-pay | Admitting: *Deleted

## 2015-11-14 ENCOUNTER — Encounter: Payer: Self-pay | Admitting: *Deleted

## 2015-11-14 DIAGNOSIS — C321 Malignant neoplasm of supraglottis: Secondary | ICD-10-CM

## 2015-11-14 NOTE — Telephone Encounter (Addendum)
Oncology Nurse Navigator Documentation  Ms Gunkel called with expressed concerns about PEG insertion site. She stated presence of "thumbtack-size round fatty, discolored" protuberance with "viscous, brownish discharge". She noted site is painful. "I want to get this checked because I have a wedding to go to on Thursday." I arranged IR evaluation at [facility] Radiology for tomorrow at 0830, called her to inform.  She voiced appreciation.  [name], RN, BSN, [place] at [place] [phone]

## 2015-11-15 ENCOUNTER — Ambulatory Visit (HOSPITAL_COMMUNITY)
Admission: RE | Admit: 2015-11-15 | Discharge: 2015-11-15 | Disposition: A | Discharge status: Home / Self Care | Payer: Medicare Other | Source: Ambulatory Visit | Attending: Radiation Oncology | Admitting: Radiation Oncology

## 2015-11-15 DIAGNOSIS — C321 Malignant neoplasm of supraglottis: Secondary | ICD-10-CM

## 2015-11-15 DIAGNOSIS — L929 Granulomatous disorder of the skin and subcutaneous tissue, unspecified: Secondary | ICD-10-CM | POA: Diagnosis not present

## 2015-11-15 HISTORY — PX: IR GENERIC HISTORICAL: IMG1180011

## 2015-11-15 MED ORDER — SILVER NITRATE-POT NITRATE 75-25 % EX MISC
CUTANEOUS | Status: AC
  Filled 2015-11-15: qty 1

## 2015-11-15 NOTE — Progress Notes (Signed)
A user error has taken place: encounter opened in error, closed for administrative reasons.

## 2015-11-15 NOTE — Progress Notes (Signed)
Patient ID: Renee [name], female   DOB: September 24, 1934, 81 y.o.   MRN: [id number]    Referring Physician(s): Eppie Gibson  Supervising Physician: Sandi Mariscal  Patient Status: inpt  Chief Complaint: g-tube concerns  Subjective: The patient had laryngeal cancer removed at 81yo and has since had nodal mets noted on a PET scan.  She presented on 08-08-15 for placement of a gastrostomy tube.  She has done well with her g-tube, but recently began to notice something around the bottom of the opening that has been painful when the tube rubs up against it.  She presents today for evaluation.   Allergies: Levofloxacin; Penicillins; Spiriva [tiotropium bromide monohydrate]; Statins; Budesonide-formoterol fumarate; Morphine; Phenazopyridine hcl; Pneumococcal vaccines; Prednisone; Sulfa antibiotics; and Sulfonamide derivatives  Medications: Prior to Admission medications   Medication Sig Start Date End Date Taking? Authorizing Provider  acetaminophen (TYLENOL) 500 MG tablet Take 500 mg by mouth every 6 (six) hours as needed for moderate pain or headache.     Historical Provider, MD  albuterol (PROAIR HFA) 108 (90 Base) MCG/ACT inhaler Inhale 2 puffs into the lungs every 6 (six) hours as needed for wheezing or shortness of breath.     Historical Provider, MD  ALPRAZolam Duanne Moron) 0.25 MG tablet Take 0.125 mg by mouth 2 (two) times daily as needed for anxiety or sleep.     Historical Provider, MD  apixaban (ELIQUIS) 5 MG TABS tablet Take 1 tablet (5 mg total) by mouth 2 (two) times daily. 08/17/14   Thompson Grayer, MD  cholecalciferol (VITAMIN D) 1000 UNITS tablet Take 1,000 Units by mouth daily.    Historical Provider, MD  esomeprazole (NEXIUM) 40 MG capsule Take 40 mg by mouth daily before breakfast.      Historical Provider, MD  HYDROcodone-acetaminophen (HYCET) 7.5-325 mg/15 ml solution Place 15 mLs into feeding tube every 4 (four) hours as needed for moderate pain (May take 67mL QHS and in the middle of  the night.). 10/31/15   Holley Bouche, NP  levothyroxine (SYNTHROID, LEVOTHROID) 25 MCG tablet Take 25 mcg by mouth daily before breakfast.  03/02/13   Historical Provider, MD  lidocaine (XYLOCAINE) 2 % solution Mix 1 part 2%viscous lidocaine,1part H2O.Swish and/or swallow 61mL of this mixture,67min before meals and at bedtime, up to QID Patient not taking: Reported on 08/29/2015 06/22/15   Eppie Gibson, MD  metoprolol succinate (TOPROL-XL) 25 MG 24 hr tablet Take 1 tablet (25 mg total) by mouth daily. 03/28/15   Deboraha Sprang, MD  nitroGLYCERIN (NITROSTAT) 0.4 MG SL tablet Place 0.4 mg under the tongue every 5 (five) minutes x 3 doses as needed for chest pain.     Historical Provider, MD  Nutritional Supplements (FEEDING SUPPLEMENT, OSMOLITE 1.5 CAL,) LIQD Give 1 can of Osmolite 1.5 or equivalent - 5 times daily with 60 cc free water before and after bolus feeding via PEG.  Give additional 240 cc water BID. Send formula and supplies. 08/08/15   Eppie Gibson, MD  ondansetron (ZOFRAN) 4 MG tablet Take 1 tablet (4 mg total) by mouth every 8 (eight) hours as needed for nausea or vomiting. 08/10/15   Holley Bouche, NP  OXYGEN Inhale 2 L into the lungs as directed. As needed for shortness of breath    Historical Provider, MD  Polyethyl Glycol-Propyl Glycol (SYSTANE OP) Place 1 drop into both eyes 2 (two) times daily.     Historical Provider, MD  polyethylene glycol (MIRALAX / GLYCOLAX) packet Take 17 g by mouth daily as  needed.    Historical Provider, MD  senna (SENOKOT) 8.6 MG tablet Take 1 tablet by mouth daily as needed for constipation.     Historical Provider, MD  sodium fluoride (FLUORISHIELD) 1.1 % GEL dental gel Instill one drop of gel per tooth space of fluoride tray. Place over teeth for 5 minutes. Remove. Spit out excess. Repeat nightly. 06/27/15   Lenn Cal, DDS    Vital Signs: There were no vitals taken for this visit.  Physical Exam: Gen: Pleasant, NAD Abd: g-tube in good  position.  There is a small piece of hypergranulation tissue present at the most inferior portion of the g-tube insertion site.  2 silver nitrate sticks were used to burn this tissue.  She tolerated it well.  Imaging: No results found.  Labs:  CBC:  Recent Labs  08/08/15 0907 08/08/15 1329 08/15/15 0941 08/16/15 0433 08/22/15 1340  WBC 4.1  --  10.5* 3.6* 6.0  HGB 12.1 11.6* 13.0 10.4* 12.3  HCT 40.6 34.0* 41.3 34.5* 39.0  PLT 229  --  348 252 406*    COAGS:  Recent Labs  08/08/15 0907  INR 1.11  APTT 33    BMP:  Recent Labs  04/25/15 1104  06/02/15 1133  08/08/15 0907  08/16/15 0433 08/22/15 1339 08/29/15 1456 09/05/15 1249 09/19/15 1428  NA 140  --  140  < > 139  < > 138 138 139 139 138  K 4.1  --  2.7*  < > 2.5*  < > 3.8 5.5* 4.6 4.5 4.1  CL 102  --  99*  --  100*  --  106  --   --   --   --   CO2 27  --  29  --  30  < > 27 29 31* 30* 28  GLUCOSE 79  --  105*  < > 98  < > 99 107 99 100 106  BUN 10  --  <5*  --  <5*  < > 9 16.3 18.9 14.8 19.0  CALCIUM 8.8  --  8.5*  --  8.7*  < > 8.5* 10.0 9.9 9.5 10.2  CREATININE 0.96*  < > 0.95  --  0.88  < > 0.66 0.9 0.8 0.8 0.8  GFRNONAA  --   --  56*  --  >60  --  >60  --   --   --   --   GFRAA  --   --  >60  --  >60  --  >60  --   --   --   --   < > = values in this interval not displayed.  LIVER FUNCTION TESTS:  Recent Labs  08/10/15 1453 08/15/15 0941  BILITOT 0.70 0.85  AST 17 23  ALT <9 <9  ALKPHOS 143 142  PROT 7.2 7.9  ALBUMIN 3.0* 3.3*    Assessment and Plan: 1. Hypergranulation tissue around g-tube -this small amount of tissue was burned with 2 silver nitrate sticks.  This should begin to disintegrate the tissue and help her discomfort.  She should anticipate that this site will begin to get better but if it does not resolve completely and it is still causing her discomfort, she can call back and we can reschedule her to come back and get it retreated.   Electronically Signed: Henreitta Cea 11/15/2015, 8:53 AM   I spent a total of 15 Minutes at the the patient's bedside AND on the patient's hospital floor or  unit, greater than 50% of which was counseling/coordinating care for hypergranulation tissue of g-tube site

## 2015-11-28 ENCOUNTER — Telehealth: Payer: Self-pay | Admitting: Adult Health

## 2015-11-28 ENCOUNTER — Other Ambulatory Visit: Payer: Self-pay | Admitting: Adult Health

## 2015-11-28 DIAGNOSIS — C321 Malignant neoplasm of supraglottis: Secondary | ICD-10-CM

## 2015-11-28 MED ORDER — HYDROCODONE-ACETAMINOPHEN 7.5-325 MG/15ML PO SOLN: 15.0000 mL | ORAL | 0 refills | Status: DC | PRN

## 2015-11-28 NOTE — Telephone Encounter (Signed)
I received a call from Mr. Fjeld, the patient's husband, requesting a refill on the patient's hydrocodone. He tells me she is taking pain medicine about 3-4 times a day. But he does feel like she is requiring less and less. He tells me her voice is starting to come back. She is overall feeling pretty well.  Below is the available database information from the [place]. It is appropriate for the patient to receive a refill of hydrocodone at this time. I encouraged Mr. [place] to call me with any questions or concerns; I look forward to seeing them in early November.  I have left a prescription for the patient to pick up with the nurses today. I explained to Mr. Matsunaga that he would have to show photo ID and sign for the prescription given that it is of controlled substance.   Mike Craze, NP [place] [phone]

## 2015-12-07 ENCOUNTER — Encounter (HOSPITAL_COMMUNITY): Payer: Self-pay | Admitting: Radiology

## 2015-12-12 ENCOUNTER — Ambulatory Visit (HOSPITAL_BASED_OUTPATIENT_CLINIC_OR_DEPARTMENT_OTHER): Payer: Medicare Other | Admitting: Adult Health

## 2015-12-12 ENCOUNTER — Ambulatory Visit: Payer: Medicare Other | Attending: Radiation Oncology

## 2015-12-12 VITALS — BP 126/52 | HR 75 | Temp 97.7°F | Resp 16 | Wt 133.4 lb

## 2015-12-12 DIAGNOSIS — L599 Disorder of the skin and subcutaneous tissue related to radiation, unspecified: Secondary | ICD-10-CM | POA: Insufficient documentation

## 2015-12-12 DIAGNOSIS — M6249 Contracture of muscle, multiple sites: Secondary | ICD-10-CM | POA: Insufficient documentation

## 2015-12-12 DIAGNOSIS — H6692 Otitis media, unspecified, left ear: Secondary | ICD-10-CM | POA: Diagnosis not present

## 2015-12-12 DIAGNOSIS — E46 Unspecified protein-calorie malnutrition: Secondary | ICD-10-CM

## 2015-12-12 DIAGNOSIS — R1310 Dysphagia, unspecified: Secondary | ICD-10-CM

## 2015-12-12 DIAGNOSIS — C321 Malignant neoplasm of supraglottis: Secondary | ICD-10-CM | POA: Diagnosis not present

## 2015-12-12 DIAGNOSIS — I890 Lymphedema, not elsewhere classified: Secondary | ICD-10-CM | POA: Insufficient documentation

## 2015-12-12 MED ORDER — AZITHROMYCIN 250 MG PO TABS: ORAL_TABLET | ORAL | 0 refills | Status: DC

## 2015-12-12 NOTE — Therapy (Signed)
[facility] [address] [place], [place], [address] Phone: [phone]   Fax:  [phone]  Speech Language Pathology Treatment  Patient Details  Name: Renee [name] MRN: [id number] Date of Birth: 12/21/1934 Referring Provider: Eppie Gibson MD  Encounter Date: 12/12/2015      End of Session - 12/12/15 1137    Visit Number 4   Number of Visits 6   Date for SLP Re-Evaluation 02/20/16   SLP Start Time 33   SLP Stop Time  1105   SLP Time Calculation (min) 43 min   Activity Tolerance Patient tolerated treatment well      Past Medical History:  Diagnosis Date  . Anxiety    takes Xanax as needed  . Arthritis   . Brain aneurysm   . Chronic back pain    buldging disc  . Chronic bronchitis ([place])   . Chronic diastolic CHF (congestive heart failure) (H[facility])    was on Lasix but has been off for a few months  . Chronic respiratory failure ([place])   . Coronary artery disease    a. CABG 09/2009. b. normal myoview in 05/2012.   [name] Dysphagia   . Emphysema lung ([place])   . Emphysema/COPD ([place])    on home O2  . GERD (gastroesophageal reflux disease)    takes Nexium daily  . History of colon polyps    benign  . History of shingles   . History of vertigo    doesn't take any meds  . HLD (hyperlipidemia)    can't take meds d/t joint aches/pains  . Hypertension   . Hypothyroidism    takes Synthroid daily  . IBS (irritable bowel syndrome)   . Internal hemorrhoids   . Macular degeneration    dry  . Myocardial infarction 2009   takes Eliquis daily.  . Orthostatic hypotension   . Osteoporosis   . PAF (paroxysmal atrial fibrillation) ([place])    a. Post op after CABG. Intolerant of amio in the past. b. Recurrence - started on Tikosyn 11/2013.  [name] Pneumonia    several yrs ago  . PONV (postoperative nausea and vomiting)   . RBBB    takes Metoporolol daily  . Sciatic nerve pain     Past Surgical History:  Procedure  Laterality Date  . APPENDECTOMY  1995  . ATRIAL FIBRILLATION ABLATION N/A 05/11/2014   Procedure: ATRIAL FIBRILLATION ABLATION;  Surgeon: Thompson Grayer, MD;  Location[facility] LAB;  Service: Cardiovascular;  Laterality: N/A;  . CARDIAC CATHETERIZATION  1990's; 06/2009  . CATARACT EXTRACTION W/ INTRAOCULAR LENS  IMPLANT, BILATERAL Bilateral   . CHOLECYSTECTOMY  2008  . colonosocpy    . CORONARY ARTERY BYPASS GRAFT  09/21/2009   CABG X2  . DIRECT LARYNGOSCOPY N/A 06/03/2015   Procedure: Direct laryngoscopy with biopsy left epiglottic lesion;  Surgeon: [name], MD;  Location: [facility];  Service: ENT;  Laterality: N/A;  . ESOPHAGOGASTRODUODENOSCOPY    . IR GENERIC HISTORICAL  11/15/2015   IR RADIOLOGIST EVAL & MGMT 11/15/2015 Saverio Danker, PA-C [facility]-INTERV RAD  . JOINT REPLACEMENT    . LARYNX SURGERY  ~ 1960   "tumor removed"  . RETINAL LASER PROCEDURE Left 10/19/2013   "had a wrinkle in it"  . TEE WITHOUT CARDIOVERSION N/A 05/10/2014   Procedure: TRANSESOPHAGEAL ECHOCARDIOGRAM (TEE);  Surgeon: Sueanne Margarita, MD;  Location[facility] ENDOSCOPY;  Service: Cardiovascular;  Laterality: N/A;  . TONSILLECTOMY AND ADENOIDECTOMY    . TOTAL ABDOMINAL HYSTERECTOMY  [place] Right 11/01/2008  . TUBAL LIGATION  1973    There were no vitals filed for this visit.      Subjective Assessment - 12/12/15 1037    Subjective Pt reports liquids are still causing throat clearing, but not coughing like last visit.   Patient is accompained by: Family member  husband   Currently in Pain? No/denies               ADULT SLP TREATMENT - 12/12/15 1044      General Information   Behavior/Cognition Alert;Cooperative;Pleasant mood     Treatment Provided   Treatment provided Dysphagia     Dysphagia Treatment   Temperature Spikes Noted No   Respiratory Status Room air   Oral Cavity - Dentition Dentures, top   Treatment Methods Skilled observation;Compensation strategy  training;Patient/caregiver education;Therapeutic exercise   Patient observed directly with PO's Yes   Type of PO's observed Thin liquids   Pharyngeal Phase Signs & Symptoms Immediate throat clear  consistently with H2O   Other treatment/comments No overt s/s aspiration PNA reported nor observed. Pt completed HEP with rare min A, and told SLP correctly why she is completing HEP. As for the consistent throat clear with water, SLP suggested sipping liquids with two effortful swallows, clear throat, then another swallow. Pt practiced this x5 and was independent by trial number 3.  SLP told pt due to pt's reported xerostomia, that decaf coffee was better than caffeinated, and to drink that instead. Pt with questions about cause of lymphedema, thinking it was due to her sleeping on her back and not her side like she used to. SLP told pt likely not so, but to ask MD, or PT. Pt not eating enough to maintain caloric need (eating <1 meal per day) and desires assistance with how to manage POs/tube feeding. "I want to get this tube out!" SLP told pt that in order to do that pt would have to maintain weight from PO intake only, and pt raised her eyebrows and opened her mouth.      Assessment / Recommendations / Plan   Plan Continue with current plan of care     Progression Toward Goals   Progression toward goals Progressing toward goals          SLP Education - 12/12/15 1136    Education provided Yes   Education Details HEP, need for decaf coffee given xerostomia, need to incr POs to have PEG removed   Person(s) Educated Patient;Spouse   Methods Explanation;Demonstration;Verbal cues   Comprehension Verbalized understanding;Returned demonstration          SLP Short Term Goals - 12/12/15 1105      SLP SHORT TERM GOAL #1   Title pt will demo HEP with rare min A   Status Achieved     SLP SHORT TERM GOAL #2   Title pt will tell SLP why she is completing HEP with modified independence over two  therapy sessions   Status Achieved          SLP Long Term Goals - 12/12/15 1106      SLP LONG TERM GOAL #1   Title pt will demo HEP with modified independnce over two sessions   Baseline one session 12-12-15   Time 2   Period --  therapy sessions, for all LTGs (i.e., 5 sessions including eval)   Status On-going     SLP LONG TERM GOAL #2   Title pt will tell  SLP how a food journal can assist her in returning to full and least restrictive PO diet   Time 2   Period --  visits   Status On-going     SLP LONG TERM GOAL #3   Title pt will tell SLP 3 s/s aspiration PNA with modified independence   Time 2   Period --  visits   Status On-going          Plan - 12/12/15 1138    Clinical Impression Statement See pt's goal update, above. Pt demonstrated consistent throat clear with liquids during the session today. Pt has attempted more POs and more of HEP more regularly, however is only eating <1 meal/day. Pt would cont to benefit from cont'd ST to assess proper completion of HEP as well as to assess for safety with POs. She may benefit from multi-disciplinary clinic to take advantage of both PT and dietician consults as well.   Speech Therapy Frequency --  approx every 4 weeks   Duration --  2 more visits, until 02-20-16   Treatment/Interventions Aspiration precaution training;SLP instruction and feedback;Compensatory strategies;Pharyngeal strengthening exercises;Oral motor exercises;Trials of upgraded texture/liquids;Cueing hierarchy;Patient/family education   Potential to Achieve Goals Good   Consulted and Agree with Plan of Care Patient      Patient will benefit from skilled therapeutic intervention in order to improve the following deficits and impairments:   Dysphagia, unspecified type    Problem List Patient Active Problem List   Diagnosis Date Noted  . Acute bronchitis 08/17/2015  . 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 Chronic obstructive pulmonary disease  ([place]) 03/16/2015  . Moderate COPD (chronic obstructive pulmonary disease) ([place]) 03/16/2015  . COPD with chronic bronchitis ([place]) 12/13/2014  . Bibasilar crackles 12/13/2014  . Irritated throat 12/13/2014  . Chronic respiratory failure with hypoxia ([place]) 08/18/2014  . Cough 05/19/2014  . A-fib ([place]) 05/11/2014  . Orthostatic hypotension 01/18/2014  . HLD (hyperlipidemia)   . Hypertension   . Dizziness 11/30/2013  . Paroxysmal a-fib ([place]) 12/04/2012  . Palpitations 11/23/2011  . Hypothyroidism   . 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 IBS (irritable bowel syndrome)   . Bundle branch block, right   . Coronary artery disease   . Fatigue   . Bruises easily   . Joint pain   . Anxiety   . Chronic diastolic CHF (congestive heart failure) ([place]) 12/07/2009  . COPD (chronic obstructive pulmonary disease) ([place]) 12/07/2009  . GERD 06/27/2009  . PERSONAL HX COLONIC POLYPS 06/27/2009    [facility] ,MS, CCC-SLP  12/12/2015, 11:42 AM  [facility] [address] [place], [place], [address] Phone: [phone]   Fax:  [phone]   Name: Renee [name] MRN: [id number] Date of Birth: 1934-03-29

## 2015-12-12 NOTE — Patient Instructions (Signed)
Drink decaf instead of caffeinated coffee.  With ANY liquids, swallow twice, clear throat and swallow again.  Continue to do exercises at least twice each day, 6/7 days per week.

## 2015-12-13 ENCOUNTER — Encounter: Payer: Self-pay | Admitting: Adult Health

## 2015-12-13 NOTE — Progress Notes (Signed)
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 Direct visualization & possible biopsy recommended. No adenopathy.       05/20/2015 Imaging    CT neck: Nodularity of (L) epiglottis could represent a mass of polyp. No adenopathy.       06/03/2015 Initial Biopsy    Direct laryngoscopy Renee [name]). Epiglottis biopsy: Squamous cell carcinoma.  (R) lateral wall larynx biopsy: Squamous cell carcinoma, moderately to poorly differentiated. p16 strongly (+) on both specimens.       06/24/2015 Initial Diagnosis    Carcinoma of epiglottis (Renee [name])      06/30/2015 PET scan    Hypermetabolic thickening of epiglottis which extends inferiorly on (R) to level of arytenoid cartilage. 3 sub-cm hypermetabolic (R) cervical LNs consistent with nodal  mets. 2 level 2 and 1 level 3 LN. No distant mets.       07/19/2015 - 09/08/2015 Radiation Therapy    IMRT [name]). The supraglottis and bilateral neck were treated to 70 Gy in 35 fractions. Intermediate risk nodal echelons were treated  to 63 Gy in 35 fractions.  Lower risk nodal echelons were treated to 56 Gy in 35 fractions        INTERVAL HISTORY:  Renee [name] presents for routine follow-up for H&N cancer.  She is continuing to work on her oral intake. She experiences occasional dysphagia, particularly with some of her pills.  She recently saw [name] with speech therapy, and he told her she was doing well. She continues to do her speech therapy/swallowing exercises and overall feels she is continuing to improve.  She has occasional throat pain, which she manages with the Hycet as needed. She does tube feedings 4 times a day, and is tolerating these without issues.  She does not feel like she is dehydrated at all; she tells me she is no longer  dizzy whenever she stands up, which is much improved from previous.    She continues to complain about her left ear feeling full and they're being occasional pain associated with this. She attempted to reach Dr. [name] office (ENT) by phone twice, but did not receive a return call from his office. She would like me to take a look at her ears again today.   Her previous UTI symptoms are completely resolved.  She is here today with her husband; she ambulated with a walker today to clinic.    ADDITIONAL REVIEW OF SYSTEMS:  Review of Systems  Constitutional: Positive for malaise/fatigue.       Energy levels improving some  HENT: Positive for sore throat.        -Hycet alone controlled pain well.  -(L) ear fullness  Respiratory: Positive for cough and sputum production.        (+) phlegm production   Gastrointestinal: Positive for nausea.       -Occasional nausea -Bowels moving regularly   Genitourinary: Negative for dysuria, frequency and hematuria.  Musculoskeletal: Negative for falls.  Neurological: Positive for speech change.       Voice hoarse    CURRENT MEDICATIONS:  Current Outpatient Prescriptions on File Prior to Visit  Medication Sig Dispense Refill  . acetaminophen (TYLENOL) 500 MG tablet Take 500 mg by mouth every 6 (six) hours as needed for moderate  pain or headache.     . albuterol (PROAIR HFA) 108 (90 Base) MCG/ACT inhaler Inhale 2 puffs into the lungs every 6 (six) hours as needed for wheezing or shortness of breath.     . ALPRAZolam (XANAX) 0.25 MG tablet Take 0.125 mg by mouth 2 (two) times daily as needed for anxiety or sleep.     [name] apixaban (ELIQUIS) 5 MG TABS tablet Take 1 tablet (5 mg total) by mouth 2 (two) times daily. 60 tablet 1  . cholecalciferol (VITAMIN D) 1000 UNITS tablet Take 1,000 Units by mouth daily.    [name] esomeprazole (NEXIUM) 40 MG capsule Take 40 mg by mouth daily before breakfast.      . HYDROcodone-acetaminophen (HYCET) 7.5-325 mg/15 ml solution  Place 15 mLs into feeding tube every 4 (four) hours as needed for moderate pain (May take 42mL QHS and in the middle of the night.). 750 mL 0  . levothyroxine (SYNTHROID, LEVOTHROID) 25 MCG tablet Take 25 mcg by mouth daily before breakfast.     . lidocaine (XYLOCAINE) 2 % solution Mix 1 part 2%viscous lidocaine,1part H2O.Swish and/or swallow 58mL of this mixture,44min before meals and at bedtime, up to QID 100 mL 5  . metoprolol succinate (TOPROL-XL) 25 MG 24 hr tablet Take 1 tablet (25 mg total) by mouth daily.    . nitroGLYCERIN (NITROSTAT) 0.4 MG SL tablet Place 0.4 mg under the tongue every 5 (five) minutes x 3 doses as needed for chest pain.     . Nutritional Supplements (FEEDING SUPPLEMENT, OSMOLITE 1.5 CAL,) LIQD Give 1 can of Osmolite 1.5 or equivalent - 5 times daily with 60 cc free water before and after bolus feeding via PEG.  Give additional 240 cc water BID. Send formula and supplies. 1185 mL 0  . ondansetron (ZOFRAN) 4 MG tablet Take 1 tablet (4 mg total) by mouth every 8 (eight) hours as needed for nausea or vomiting. 50 tablet 3  . OXYGEN Inhale 2 L into the lungs as directed. As needed for shortness of breath    . Polyethyl Glycol-Propyl Glycol (SYSTANE OP) Place 1 drop into both eyes 2 (two) times daily.     . polyethylene glycol (MIRALAX / GLYCOLAX) packet Take 17 g by mouth daily as needed.    . senna (SENOKOT) 8.6 MG tablet Take 1 tablet by mouth daily as needed for constipation.     . sodium fluoride (FLUORISHIELD) 1.1 % GEL dental gel Instill one drop of gel per tooth space of fluoride tray. Place over teeth for 5 minutes. Remove. Spit out excess. Repeat nightly. 120 mL prn  . [DISCONTINUED] budesonide-formoterol (SYMBICORT) 160-4.5 MCG/ACT inhaler Inhale 2 puffs into the lungs 2 (two) times daily. 1 Inhaler 12  . [DISCONTINUED] calcium citrate-vitamin D (CITRACAL+D) 315-200 MG-UNIT per tablet Take 1 tablet by mouth 2 (two) times daily.       No current facility-administered  medications on file prior to visit.     ALLERGIES:  Allergies  Allergen Reactions  . Levofloxacin Nausea Only    Unable to tolerate more than 7 days  . Penicillins Swelling and Rash    Swelling location undefined. Has patient had a PCN reaction causing immediate rash, facial/tongue/throat swelling, SOB or lightheadedness with hypotension: Yes Has patient had a PCN reaction causing severe rash involving mucus membranes or skin necrosis: No Has patient had a PCN reaction that required hospitalization Unk Has patient had a PCN reaction occurring within the last 10 years: Unk If all of the  above answers are "NO", then may proceed with Cephalosporin use.   [name] Spiriva [Tiotropium Bromide Monohydrate] Other (See Comments)    Throat irritation  . Statins Other (See Comments)    Myalgias  . Budesonide-Formoterol Fumarate Other (See Comments)    Tachycardia  . Morphine Other (See Comments)    double vision  . Phenazopyridine Hcl Other (See Comments)    *piridium* face red  . Pneumococcal Vaccines Swelling    [place] of the skin  . Prednisone Swelling    Can't take PO but injectable is fine  . Sulfa Antibiotics Rash  . Sulfonamide Derivatives Rash     PHYSICAL EXAM:  Vitals: Vitals:   12/12/15 1139  BP: (!) 126/52  Pulse: 75  Resp: 16  Temp: 97.7 F (36.5 C)   Filed Weights   12/12/15 1139  Weight: 133 lb 6.4 oz (60.5 kg)    Weight Date  133 lb 6.4 oz (60.5 kg) 12/12/15  131 lb 4.8 oz (59.6 kg) 10/31/15  129 lb 14.4 oz (58.9 kg) 10/12/15  129 lb 1.6 oz (58.6 kg) 09/29/15  127 lb 14.4 oz (58 kg) 09/19/15  131 lb 8 oz (59.6 kg) 09/05/15  129 lb 8 oz (58.7 kg) 08/29/15  126 lb 12.8 oz (57.516 kg) 08/26/15  126 lb 14.4 oz (57.561 kg) 08/22/15  127 lb 12.8 oz (57.97 kg) 08/10/15  129 lb 4.8 oz (58.65 kg) 08/05/15  132 lb 1.6 oz (59.92 kg) 08/01/15  128 lb 1.6 oz (58.106 kg) 07/25/15  129 lb 1.6 oz (58.559 kg) Pre-treatment: 06/22/15   General: Female in no acute distress. Accompanied  by her husband today.  Seen seated in wheelchair. HEENT: Head normocephalic.  Pupils equal and reactive to light. Conjunctivae clear without exudate.  Sclerae anicteric. Oral mucosa is pink; dentures in place. No evidence of thrush. (L) TM bulging with evidence of middle ear effusion; no erythema. Unable to visualize (R) TM d/t cerumen impaction. Voice quality continuing to improve. Lymph: No appreciable cervical, supraclavicular, or infraclavicular lymphadenopathy noted on palpation.  Neck: Skin on neck is dry. Mild anterior neck lymphedema  Cardiovascular: Regular rate and regular rhythm.  Respiratory: Clear to auscultation bilaterally.  Breathing non-labored. GI: Abdomen soft and round. Bowel sounds normoactive. G-tube in place; dressing clean, dry, and intact.  GU: Deferred.   Neuro: No focal deficits.  Psych: Normal mood and affect for situation. Extremities: No edema. Skin: Warm and dry.    LABORATORY DATA:  None for this visit.   DIAGNOSTIC IMAGING:  None at this visit.    ASSESSMENT & PLAN:  Renee [name] is a pleasant 81 y.o. 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 She presents to the Survivorship Clinic today for routine follow-up to address any acute concerns since completing treatment.   1. Cancer of the epiglottis:  Ms. Mecca is continuing to recover from definitive radiation therapy treatment for head & neck cancer. Clinically, she continues to improve quite well. She ambulated to clinic today using a walker, when at previous visits she required a wheelchair to preserve her energy.  I provided support and encouragement for her today and recognized her efforts in continuing to improve her strength.  She will have her post-treatment PET scan on 01/02/16, and then will follow-up with Dr. [name] in her clinic on 01/04/16 to get the results of the PET scan and for continued follow-up. I encouraged Mrs. Maddix to call me  with any questions or  concerns before that visit, and I'd be happy to see her sooner if needed.  2. Left otitis media: Ms. Rogstad has had intermittent left ear pain for the past several weeks. Given that her symptoms have not resolved and there is evidence of middle ear effusion on physical exam, I have prescribed a course of azithromycin to help treat any potential bacterial etiology of this effusion. She tells me she has tolerated Z-pak well in the past, and this was e-prescribed to her pharmacy.  Encouraged her to let me know if her symptoms do not resolve with antibiotic therapy.   3. Dysphagia/Pain management:  Ms. Midgley has some dysphagia; she continues to see Garald Balding, SLP, as appropriate.  I encouraged her to continue to work on her swallowing exercises, as instructed by [name].  She will continue to follow-up with him as recommended.  For pain, her current regimen with Hycet alone is sufficient.  Her last refill was provided to her on 11/28/15, and she continues to have plenty of her pain medication at this time. We discussed that as she continues to heal, she will need less and less pain medication.  I encouraged her to continue to use the pain medicine as prescribed, as this will aid in her continued healing.   4. Protein-calorie malnutrition, stable: She is tolerating her tube feedings well. Her weight is increased a bit, which is encouraging. I encouraged her to continue to try a different foods by mouth she is very motivated to get her feeding tube removed as soon as she is able. I reminded her that she has to be able to maintain her weight and fluid status on oral intake alone; she was also be deemed safe to swallow by Garald Balding, our speech therapist.  She will keep working on her oral intake efforts, and continue to use her tube feedings as well.  She will follow-up with Dory Peru, RD as needed/directed.     Dispo:   -Post-treatment PET scan on 01/02/16 -Return to cancer center  to see Dr. [name] on 01/04/16 to get results of PET scan and for continued follow-up.     A total of 35 minutes was spent in face-to-face care of this patient with greater than 50% of that time spent in counseling & care-coordination.    Mike Craze, NP [place] [phone]

## 2015-12-15 ENCOUNTER — Telehealth: Payer: Self-pay | Admitting: *Deleted

## 2015-12-15 NOTE — Telephone Encounter (Signed)
Oncology Nurse Navigator Documentation  Called Ms. Fjelstad to determine availability to attend next Tuesday's H&N MDC to be seen by Nutrition, PT and SW.  She indicated can attend, I provided a 0900 arrival to Radiation Waiting, explained registration and arrival procedures.  She voiced understanding.  [name], RN, BSN, [place] [facility] [place] [phone]

## 2015-12-19 DIAGNOSIS — Z1231 Encounter for screening mammogram for malignant neoplasm of breast: Secondary | ICD-10-CM | POA: Diagnosis not present

## 2015-12-19 DIAGNOSIS — Z803 Family history of malignant neoplasm of breast: Secondary | ICD-10-CM | POA: Diagnosis not present

## 2015-12-20 ENCOUNTER — Ambulatory Visit
Admission: RE | Admit: 2015-12-20 | Discharge: 2015-12-20 | Disposition: A | Discharge status: Home / Self Care | Payer: Medicare Other | Source: Ambulatory Visit | Attending: Radiation Oncology | Admitting: Radiation Oncology

## 2015-12-20 ENCOUNTER — Ambulatory Visit: Payer: Medicare Other | Admitting: Nutrition

## 2015-12-20 ENCOUNTER — Encounter: Payer: Self-pay | Admitting: *Deleted

## 2015-12-20 ENCOUNTER — Ambulatory Visit: Payer: Medicare Other | Admitting: Physical Therapy

## 2015-12-20 VITALS — BP 122/75 | HR 84 | Temp 98.0°F | Ht 66.0 in | Wt 131.6 lb

## 2015-12-20 DIAGNOSIS — M6249 Contracture of muscle, multiple sites: Secondary | ICD-10-CM | POA: Diagnosis not present

## 2015-12-20 DIAGNOSIS — L599 Disorder of the skin and subcutaneous tissue related to radiation, unspecified: Secondary | ICD-10-CM

## 2015-12-20 DIAGNOSIS — R1310 Dysphagia, unspecified: Secondary | ICD-10-CM | POA: Diagnosis not present

## 2015-12-20 DIAGNOSIS — I890 Lymphedema, not elsewhere classified: Secondary | ICD-10-CM

## 2015-12-20 DIAGNOSIS — C321 Malignant neoplasm of supraglottis: Secondary | ICD-10-CM

## 2015-12-20 NOTE — Therapy (Signed)
[place], [place], [address] Phone: [phone]   Fax:  [phone]  Physical Therapy Evaluation  Patient Details  Name: Renee [name] MRN: [id number] Date of Birth: 1934/11/13 Referring Provider: Dr. Eppie Gibson  Encounter Date: 12/20/2015      PT End of Session - 12/20/15 1209    Visit Number 1   Number of Visits 9   Date for PT Re-Evaluation 01/27/16   PT Start Time 0930   PT Stop Time 0955   PT Time Calculation (min) 25 min   Activity Tolerance Patient tolerated treatment well   Behavior During Therapy [facility] for tasks assessed/performed      Past Medical History:  Diagnosis Date  . Anxiety    takes Xanax as needed  . Arthritis   . Brain aneurysm   . Chronic back pain    buldging disc  . Chronic bronchitis ([place])   . Chronic diastolic CHF (congestive heart failure) ([facility])    was on Lasix but has been off for a few months  . Chronic respiratory failure ([place])   . Coronary artery disease    a. CABG 09/2009. b. normal myoview in 05/2012.   [name] Dysphagia   . Emphysema lung ([place])   . Emphysema/COPD ([place])    on home O2  . GERD (gastroesophageal reflux disease)    takes Nexium daily  . History of colon polyps    benign  . History of shingles   . History of vertigo    doesn't take any meds  . HLD (hyperlipidemia)    can't take meds d/t joint aches/pains  . Hypertension   . Hypothyroidism    takes Synthroid daily  . IBS (irritable bowel syndrome)   . Internal hemorrhoids   . Macular degeneration    dry  . Myocardial infarction 2009   takes Eliquis daily.  . Orthostatic hypotension   . Osteoporosis   . PAF (paroxysmal atrial fibrillation) ([place])    a. Post op after CABG. Intolerant of amio in the past. b. Recurrence - started on Tikosyn 11/2013.  [name] Pneumonia    several yrs ago  . PONV (postoperative nausea and vomiting)   . RBBB    takes Metoporolol daily  . Sciatic nerve pain      Past Surgical History:  Procedure Laterality Date  . APPENDECTOMY  1995  . ATRIAL FIBRILLATION ABLATION N/A 05/11/2014   Procedure: ATRIAL FIBRILLATION ABLATION;  Surgeon: Thompson Grayer, MD;  Location: [facility] CATH LAB;  Service: Cardiovascular;  Laterality: N/A;  . CARDIAC CATHETERIZATION  1990's; 06/2009  . CATARACT EXTRACTION W/ INTRAOCULAR LENS  IMPLANT, BILATERAL Bilateral   . CHOLECYSTECTOMY  2008  . colonosocpy    . CORONARY ARTERY BYPASS GRAFT  09/21/2009   CABG X2  . DIRECT LARYNGOSCOPY N/A 06/03/2015   Procedure: Direct laryngoscopy with biopsy left epiglottic lesion;  Surgeon: [name], MD;  Location: [facility];  Service: ENT;  Laterality: N/A;  . ESOPHAGOGASTRODUODENOSCOPY    . IR GENERIC HISTORICAL  11/15/2015   IR RADIOLOGIST EVAL & MGMT 11/15/2015 Saverio Danker, PA-C [facility]-INTERV RAD  . JOINT REPLACEMENT    . LARYNX SURGERY  ~ 1960   "tumor removed"  . RETINAL LASER PROCEDURE Left 10/19/2013   "had a wrinkle in it"  . TEE WITHOUT CARDIOVERSION N/A 05/10/2014   Procedure: TRANSESOPHAGEAL ECHOCARDIOGRAM (TEE);  Surgeon: Sueanne Margarita, MD;  Location: [facility] ENDOSCOPY;  Service: Cardiovascular;  Laterality: N/A;  . TONSILLECTOMY AND ADENOIDECTOMY    .  TOTAL ABDOMINAL HYSTERECTOMY  1986  . TOTAL HIP ARTHROPLASTY Right 11/01/2008  . TUBAL LIGATION  1973    There were no vitals filed for this visit.       Subjective Assessment - 12/20/15 1158    Subjective Noticed some neck swelling when she put on a turtleneck and tissue hung over it.   Patient is accompained by: Family member  husband   Pertinent History Pt. 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  Had lost 25 lbs. prior to treatment, but is now about the same weight as at that time.  Completed XRT to supraglottis and bilateral neck 09/08/15. h/o right THA 6-7 years ago; CABG, A fib ablation, HTN, chronic backpain, emphysema, h/o vertigo episode, orthostatic  hypotension.  h/o two falls in each of which she fractured a patella, in the last 15 years.   Patient Stated Goals get help to treat lymphedema so it doesn't get any worse   Currently in Pain? Yes   Pain Score 4    Pain Location Throat   Aggravating Factors  swallowing   Pain Relieving Factors not swallowing            [facility] PT Assessment - 12/20/15 0001      Assessment   Medical Diagnosis squamous cell carcinoma of glottis   Referring Provider Dr. Eppie Gibson     Precautions   Precautions Fall;Other (comment)   Precaution Comments cancer precautions     Restrictions   Weight Bearing Restrictions No     Balance Screen   Has the patient fallen in the past 6 months No   Has the patient had a decrease in activity level because of a fear of falling?  No   Is the patient reluctant to leave their home because of a fear of falling?  No     Home Ecologist residence   Living Arrangements Spouse/significant other   Type of [place] to enter   Entrance Stairs-Number of Steps 3   [place] One level     Prior Function   Level of Independence Independent   Leisure does her neck ROM and swallowing exercises that she was taught in May at Multidisciplinary clinic     Cognition   Overall Cognitive Status Within Functional Limits for tasks assessed     Observation/Other Assessments   Observations small amount of swelling visible in a line at anterior neck, just to right of midline     Functional Tests   Functional tests Sit to Stand     Sit to Stand   Comments 9 times in 30 seconds, just below average for age     Posture/Postural Control   Posture/Postural Control Postural limitations   Postural Limitations Forward head     AROM   Overall AROM Comments shoulder AROM WFL bilat.   Cervical Flexion WFL   Cervical Extension 75% limited   Cervical - Right Side Bend 50% loss   Cervical - Left Side Bend 50%  loss   Cervical - Right Rotation 10% loss   Cervical - Left Rotation 10% loss     Palpation   Palpation comment soft extraneous tissue at anterior neck     Ambulation/Gait   Ambulation/Gait Yes   Ambulation/Gait Assistance 6: Modified independent (Device/Increase time)  not assessed; by patient report   Assistive device Rolling walker  for community ambulation, not at home, per pt.           LYMPHEDEMA/ONCOLOGY QUESTIONNAIRE - 12/20/15 1207      Type   Cancer Type glottis squamous cell carcinoma     Treatment   Past Radiation Treatment Yes   Date 09/08/15   Body Site supraglottis and bilat. neck     Head and Neck   4 cm superior to sternal notch around neck 34.3 cm   6 cm superior to sternal notch around neck 34.7 cm   8 cm superior to sternal notch around neck 35.3 cm                        PT Education - 12/20/15 1208    Education provided Yes   Education Details about lymphedema treatments and briefly about garments available for compression   Person(s) Educated Patient;Spouse   Methods Explanation   Comprehension Verbalized understanding;Need further instruction                [facility] Goals - 12/20/15 1216      CC Long Term Goal  #1   Title Patient will be knowledgeable about how and where to obtain a neck compression garment and its appropriate use   Time 4   Period Weeks   Status New     CC Long Term Goal  #2   Title Patient will be independent in self-manual lymph drainage techniques   Time 4   Period Weeks   Status New     CC Long Term Goal  #3   Title neck circumference at 6 cm. proximal to sternal notch will be reduced to 34 cm. or less   Baseline 34.7 cm.   Time 4   Period Weeks   Status New         Head and Neck Clinic Goals - 06/28/15 0908      Patient will be able to verbalize understanding of a home exercise program for cervical range of motion, posture, and walking.    Status Achieved      Patient will be able to verbalize understanding of proper sitting and standing posture.    Status Achieved     Patient will be able to verbalize understanding of lymphedema risk and availability of treatment for this condition.    Status Achieved           Plan - 12/20/15 1209    Clinical Impression Statement Pt. with head & neck squamous cell carcinoma, now s/p XRT treatment completed 09/08/15 and with a small amount of swelling that is visible, palpable, and measurable (measurements larger than those taken prior to treatment).  She also had neck ROM limitations, throat pain, forward head posture, and uses a walker for community ambulation.  She has a h/o falls with patella fractures on each side and a right THA.  h/o emphysema and CABG.     Rehab Potential Good   Clinical Impairments Affecting Rehab Potential none   PT Frequency 2x / week   PT Duration 4 weeks   PT Treatment/Interventions ADLs/Self Care Home Management;DME Instruction;Patient/family education;Orthotic Fit/Training;Manual techniques;Manual lymph drainage;Passive range of motion   PT Next Visit Plan Fashion a foam chip pack and instruct in its use; begin manual lymph drainage and instruction in same; educate about manufactured neck garment options and assist her in obtaining one; neck P/AA/A/ROM; HEP instruction.   PT Home Exercise Plan neck AROM   Consulted  and Agree with Plan of Care Patient      Patient will benefit from skilled therapeutic intervention in order to improve the following deficits and impairments:  Decreased range of motion, Decreased knowledge of use of DME, Decreased knowledge of precautions, Increased edema, Decreased mobility  Visit Diagnosis: Lymphedema, not elsewhere classified - Plan: PT plan of care cert/re-cert  Disorder of the skin and subcutaneous tissue related to radiation, unspecified - Plan: PT plan of care cert/re-cert  Contracture of muscle, multiple sites - Plan: PT plan of  care cert/re-cert      G-Codes - [id number] May 30, 1216    Functional Assessment Tool Used clinical judgement   Functional Limitation Self care   Self Care Current Status [id number]) At least 80 percent but less than 100 percent impaired, limited or restricted   Self Care Goal Status [id number]) At least 1 percent but less than 20 percent impaired, limited or restricted       Problem List Patient Active Problem List   Diagnosis Date Noted  . Acute bronchitis 08/17/2015  . Carcinoma of epiglottis ([place]) 06/24/2015  . Chronic obstructive pulmonary disease ([place]) 03/16/2015  . Moderate COPD (chronic obstructive pulmonary disease) ([place]) 03/16/2015  . COPD with chronic bronchitis ([place]) 12/13/2014  . Bibasilar crackles 12/13/2014  . Irritated throat 12/13/2014  . Chronic respiratory failure with hypoxia ([place]) 08/18/2014  . Cough 05/19/2014  . A-fib ([place]) 05/11/2014  . Orthostatic hypotension 01/18/2014  . HLD (hyperlipidemia)   . Hypertension   . Dizziness 11/30/2013  . Paroxysmal a-fib ([place]) 12/04/2012  . Palpitations 11/23/2011  . Hypothyroidism   . 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 IBS (irritable bowel syndrome)   . Bundle branch block, right   . Coronary artery disease   . Fatigue   . Bruises easily   . Joint pain   . Anxiety   . Chronic diastolic CHF (congestive heart failure) ([place]) 12/07/2009  . COPD (chronic obstructive pulmonary disease) ([place]) 12/07/2009  . GERD 06/27/2009  . PERSONAL HX COLONIC POLYPS 06/27/2009    Renee [name],Renee [name] 12/20/2015, 12:21 PM  [place], [place], [address] Phone: [phone]   Fax:  [phone]  Name: Renee [name] MRN: [id number] Date of Birth: May 07, 1934  Serafina Royals, PT 12/20/15 12:21 PM

## 2015-12-20 NOTE — Progress Notes (Signed)
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  She completed treatment on August 3.  Patient reports she continues to use her feeding tube and instill 4 cans Osmolite 1.5 daily.   She reports she is drinking water and coffee throughout the day. She has had success eating a banana sandwich, chicken noodle soup, and baked potatoes. Weight documented as 131.6 pounds which is stable the past 6 weeks.   Patient reports some concern that she is still using her feeding tube.  Nutrition diagnosis: Inadequate oral intake continues.  Intervention: Educated patient to begin one can of Ensure Plus or equivalent by mouth daily. Instructed her to decrease Osmolite 1.5 via PEG by one can as she increases Ensure Plus by mouth by one can. Reviewed a variety of soft moist foods that patient should begin adding to her diet. Encouraged her to continue increased water intake. Questions were answered.  Teach back method used.  Monitoring, evaluation, goals: Patient will increase oral intake and decrease tube feeding while maintaining weight.  Next visit: I will schedule in approximately 4 weeks.  **Disclaimer: This note was dictated with voice recognition software. Similar sounding words can inadvertently be transcribed and this note may contain transcription errors which may not have been corrected upon publication of note.**

## 2015-12-20 NOTE — Progress Notes (Signed)
Head & Neck Multidisciplinary Clinic Clinical Social Work  Clinical Social Work met with patient/family at head & neck multidisciplinary clinic to offer support and assess for psychosocial needs.  Patient has completed radiation treatment and is currently recovering.  She reports her only concern are her side effects of treatment.  She was accompanied by her spouse.  She shared they have been married for 33 years and he always accompanies her.      Clinical Social Work briefly discussed Clinical Social Work role and [place] support programs/services- specifically [employer] program, [employer], and [employer] [facility].  Clinical Social Work encouraged patient to call with any additional questions or concerns.   Polo Riley, MSW, LCSW, OSW-C Clinical Social Worker [facility] [phone]

## 2015-12-22 DIAGNOSIS — E46 Unspecified protein-calorie malnutrition: Secondary | ICD-10-CM | POA: Diagnosis not present

## 2015-12-22 DIAGNOSIS — I10 Essential (primary) hypertension: Secondary | ICD-10-CM | POA: Diagnosis not present

## 2015-12-22 DIAGNOSIS — E038 Other specified hypothyroidism: Secondary | ICD-10-CM | POA: Diagnosis not present

## 2015-12-22 DIAGNOSIS — I480 Paroxysmal atrial fibrillation: Secondary | ICD-10-CM | POA: Diagnosis not present

## 2015-12-22 DIAGNOSIS — E538 Deficiency of other specified B group vitamins: Secondary | ICD-10-CM | POA: Diagnosis not present

## 2015-12-22 DIAGNOSIS — Z7901 Long term (current) use of anticoagulants: Secondary | ICD-10-CM | POA: Diagnosis not present

## 2015-12-22 DIAGNOSIS — C321 Malignant neoplasm of supraglottis: Secondary | ICD-10-CM | POA: Diagnosis not present

## 2015-12-22 DIAGNOSIS — R7301 Impaired fasting glucose: Secondary | ICD-10-CM | POA: Diagnosis not present

## 2015-12-22 DIAGNOSIS — J449 Chronic obstructive pulmonary disease, unspecified: Secondary | ICD-10-CM | POA: Diagnosis not present

## 2015-12-23 ENCOUNTER — Telehealth: Payer: Self-pay | Admitting: *Deleted

## 2015-12-23 ENCOUNTER — Telehealth: Payer: Self-pay | Admitting: Adult Health

## 2015-12-23 ENCOUNTER — Other Ambulatory Visit: Payer: Self-pay | Admitting: Adult Health

## 2015-12-23 DIAGNOSIS — C321 Malignant neoplasm of supraglottis: Secondary | ICD-10-CM

## 2015-12-23 MED ORDER — HYDROCODONE-ACETAMINOPHEN 7.5-325 MG/15ML PO SOLN: 15.0000 mL | ORAL | 0 refills | Status: DC | PRN

## 2015-12-23 NOTE — Telephone Encounter (Signed)
Oncology Nurse Navigator Documentation  In follow-up to Ms. Bakos's report, I called Dr. Peggyann Juba office, spoke with Michel Bickers.  Indicated Ms. Luebbers has not heard back from office re her need to see Dr. [name] for issue with L ear.  Michel Bickers stated she will call Ms. Carda to arrange.  [name], RN, BSN, [place] at [place] [phone]

## 2015-12-23 NOTE — Telephone Encounter (Signed)
CALLED PATIENT TO INFORM OF NUTRITION APPT. FOR 01-18-16, NO ANSWER, MAILED APPT. CARD

## 2015-12-23 NOTE — Telephone Encounter (Signed)
I received a call from the patient's husband requesting refill on Hydrocodone/APAP solution.    Her last prescription was given on 11/28/15 and it is appropriate for refill at this time.    I have placed the orders for prescription refill and have left the prescription with injection nurse for patient/patient's husband to pick up and show photo ID at the time of pick up.    Mr. Troxell with verbal understanding of these instructions. Encouraged him to call me with any other questions or concerns.   Mike Craze, NP [facility] [place] [phone]   [place] Controlled Substance Registry data for this patient for past 2 months:

## 2015-12-23 NOTE — Progress Notes (Signed)
Oncology Nurse Navigator Documentation  Met with Ms. Megan Salon during H&N [place].  She was accompanied by her husband.  Provided verbal and written overview of [facility], the clinicians who will be seeing her, encouraged her to ask questions during her time with them.  She was seen by Nutrition, PT and SW.  Spoke with her at end of [facility], addressed questions.  She indicated the Z-pak Rxed by Survivorship NP Mike Craze during 11/6 visit was ineffective in relieving L inner ear discomfort.  I indicted I would inform Gretchen.  She indicated she has not heard from Dr. [name]' office re appt.  I indicted I would call his office on her behalf. She understands I can be contacted with needs/concerns.  [name], RN, BSN, [place] at [place] [phone]

## 2015-12-26 ENCOUNTER — Ambulatory Visit: Payer: Medicare Other | Admitting: Physical Therapy

## 2015-12-26 DIAGNOSIS — I890 Lymphedema, not elsewhere classified: Secondary | ICD-10-CM

## 2015-12-26 DIAGNOSIS — R1310 Dysphagia, unspecified: Secondary | ICD-10-CM | POA: Diagnosis not present

## 2015-12-26 DIAGNOSIS — M6249 Contracture of muscle, multiple sites: Secondary | ICD-10-CM | POA: Diagnosis not present

## 2015-12-26 DIAGNOSIS — L599 Disorder of the skin and subcutaneous tissue related to radiation, unspecified: Secondary | ICD-10-CM | POA: Diagnosis not present

## 2015-12-26 NOTE — Therapy (Signed)
[place], [place], [address] Phone: [phone]   Fax:  [phone]  Physical Therapy Treatment  Patient Details  Name: Renee [name] MRN: [id number] Date of Birth: Jun 01, 1934 Referring Provider: Dr. Eppie Gibson  Encounter Date: 12/26/2015      PT End of Session - 12/26/15 1325    Visit Number 2   Number of Visits 9   Date for PT Re-Evaluation 01/27/16   PT Start Time 0846   PT Stop Time 0934   PT Time Calculation (min) 48 min   Activity Tolerance Patient tolerated treatment well   Behavior During Therapy [facility] for tasks assessed/performed      Past Medical History:  Diagnosis Date  . Anxiety    takes Xanax as needed  . Arthritis   . Brain aneurysm   . Chronic back pain    buldging disc  . Chronic bronchitis ([place])   . Chronic diastolic CHF (congestive heart failure) (HCC)    was on Lasix but has been off for a few months  . Chronic respiratory failure ([place])   . Coronary artery disease    a. CABG 09/2009. b. normal myoview in 05/2012.   [name] Dysphagia   . Emphysema lung ([place])   . Emphysema/COPD ([place])    on home O2  . GERD (gastroesophageal reflux disease)    takes Nexium daily  . History of colon polyps    benign  . History of shingles   . History of vertigo    doesn't take any meds  . HLD (hyperlipidemia)    can't take meds d/t joint aches/pains  . Hypertension   . Hypothyroidism    takes Synthroid daily  . IBS (irritable bowel syndrome)   . Internal hemorrhoids   . Macular degeneration    dry  . Myocardial infarction 2009   takes Eliquis daily.  . Orthostatic hypotension   . Osteoporosis   . PAF (paroxysmal atrial fibrillation) ([place])    a. Post op after CABG. Intolerant of amio in the past. b. Recurrence - started on Tikosyn 11/2013.  [name] Pneumonia    several yrs ago  . PONV (postoperative nausea and vomiting)   . RBBB    takes Metoporolol daily  . Sciatic nerve pain      Past Surgical History:  Procedure Laterality Date  . APPENDECTOMY  1995  . ATRIAL FIBRILLATION ABLATION N/A 05/11/2014   Procedure: ATRIAL FIBRILLATION ABLATION;  Surgeon: Thompson Grayer, MD;  Location: [facility] CATH LAB;  Service: Cardiovascular;  Laterality: N/A;  . CARDIAC CATHETERIZATION  1990's; 06/2009  . CATARACT EXTRACTION W/ INTRAOCULAR LENS  IMPLANT, BILATERAL Bilateral   . CHOLECYSTECTOMY  2008  . colonosocpy    . CORONARY ARTERY BYPASS GRAFT  09/21/2009   CABG X2  . DIRECT LARYNGOSCOPY N/A 06/03/2015   Procedure: Direct laryngoscopy with biopsy left epiglottic lesion;  Surgeon: [name], MD;  Location: [facility];  Service: ENT;  Laterality: N/A;  . ESOPHAGOGASTRODUODENOSCOPY    . IR GENERIC HISTORICAL  11/15/2015   IR RADIOLOGIST EVAL & MGMT 11/15/2015 Saverio Danker, PA-C [facility]-INTERV RAD  . JOINT REPLACEMENT    . LARYNX SURGERY  ~ 1960   "tumor removed"  . RETINAL LASER PROCEDURE Left 10/19/2013   "had a wrinkle in it"  . TEE WITHOUT CARDIOVERSION N/A 05/10/2014   Procedure: TRANSESOPHAGEAL ECHOCARDIOGRAM (TEE);  Surgeon: Sueanne Margarita, MD;  Location: [facility] ENDOSCOPY;  Service: Cardiovascular;  Laterality: N/A;  . TONSILLECTOMY AND ADENOIDECTOMY    .  TOTAL ABDOMINAL HYSTERECTOMY  1986  . TOTAL HIP ARTHROPLASTY Right 11/01/2008  . TUBAL LIGATION  1973    There were no vitals filed for this visit.      Subjective Assessment - 12/26/15 0849    Subjective Nothing new.  More swelling yesterday, but it seems to have gone down some today.  Did her last feeding of the day earlier last night.  I get my scan next Monday.   Currently in Pain? Yes   Pain Score 5    Pain Location Throat   Aggravating Factors  swallowing   Pain Relieving Factors not swallowing            [facility] PT Assessment - 12/26/15 0001      Assessment   Medical Diagnosis                       [facility] Adult PT Treatment/Exercise - 12/26/15 0001      Self-Care   Self-Care Other Self-Care Comments    Other Self-Care Comments  fashioned foam chip pack for neck and instructed patient in its use     Manual Therapy   Manual Therapy Manual Lymphatic Drainage (MLD)   Manual Lymphatic Drainage (MLD) In supine with HOB elevated:  Instructed patient before and during treatment including diaphragmatic breathing, supraclavicular fossae, bilat. axillae, and bilat. shoulder collectors; posterolateral, lateral, anterolateral, and anterior neck; chin, cheeks, and preauricular nodes; then followed pathways down to axillae                        Long Term Clinic Goals - 12/20/15 1216      CC Long Term Goal  #1   Title Patient will be knowledgeable about how and where to obtain a neck compression garment and its appropriate use   Time 4   Period Weeks   Status New     CC Long Term Goal  #2   Title Patient will be independent in self-manual lymph drainage techniques   Time 4   Period Weeks   Status New     CC Long Term Goal  #3   Title neck circumference at 6 cm. proximal to sternal notch will be reduced to 34 cm. or less   Baseline 34.7 cm.   Time 4   Period Weeks   Status New         Head and Neck Clinic Goals - 06/28/15 0908      Patient will be able to verbalize understanding of a home exercise program for cervical range of motion, posture, and walking.    Status Achieved     Patient will be able to verbalize understanding of proper sitting and standing posture.    Status Achieved     Patient will be able to verbalize understanding of lymphedema risk and availability of treatment for this condition.    Status Achieved           Plan - 12/26/15 1325    Clinical Impression Statement Patient very motivated to get started with treatment.  Appeared to be working to assimilate information about manual lymph drainage principles and techniques.     Rehab Potential Good   Clinical Impairments Affecting Rehab Potential none   PT Frequency 2x / week    PT Duration 4 weeks   PT Treatment/Interventions ADLs/Self Care Home Management;DME Instruction;Patient/family education;Orthotic Fit/Training;Manual techniques;Manual lymph drainage;Passive range of motion   PT Next Visit Plan Reme[place]; check on  use of foam chip pack; instruct in self-manual lymph drainage sitting in front of a mirror; when time is available, discuss manufactured compression garment options.   PT Home Exercise Plan neck AROM   Consulted and Agree with Plan of Care Patient      Patient will benefit from skilled therapeutic intervention in order to improve the following deficits and impairments:  Decreased range of motion, Decreased knowledge of use of DME, Decreased knowledge of precautions, Increased edema, Decreased mobility  Visit Diagnosis: Lymphedema, not elsewhere classified     Problem List Patient Active Problem List   Diagnosis Date Noted  . Acute bronchitis 08/17/2015  . Carcinoma of epiglottis ([place]) 06/24/2015  . Chronic obstructive pulmonary disease ([place]) 03/16/2015  . Moderate COPD (chronic obstructive pulmonary disease) ([place]) 03/16/2015  . COPD with chronic bronchitis ([place]) 12/13/2014  . Bibasilar crackles 12/13/2014  . Irritated throat 12/13/2014  . Chronic respiratory failure with hypoxia ([place]) 08/18/2014  . Cough 05/19/2014  . A-fib ([place]) 05/11/2014  . Orthostatic hypotension 01/18/2014  . HLD (hyperlipidemia)   . Hypertension   . Dizziness 11/30/2013  . Paroxysmal a-fib ([place]) 12/04/2012  . Palpitations 11/23/2011  . Hypothyroidism   . 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 IBS (irritable bowel syndrome)   . Bundle branch block, right   . Coronary artery disease   . Fatigue   . Bruises easily   . Joint pain   . Anxiety   . Chronic diastolic CHF (congestive heart failure) ([place]) 12/07/2009  . COPD (chronic obstructive pulmonary disease) ([place]) 12/07/2009  . GERD 06/27/2009  . PERSONAL HX COLONIC POLYPS  06/27/2009    Estrella Alcaraz 12/26/2015, 1:28 PM  [place], [place], [address] Phone: [phone]   Fax:  [phone]  Name: Renee [name] MRN: [id number] Date of Birth: 03/06/1934   Serafina Royals, PT 12/26/15 1:28 PM

## 2016-01-02 ENCOUNTER — Encounter: Payer: Self-pay | Admitting: Radiation Oncology

## 2016-01-02 ENCOUNTER — Encounter (HOSPITAL_COMMUNITY)
Admission: RE | Admit: 2016-01-02 | Discharge: 2016-01-02 | Disposition: A | Discharge status: Home / Self Care | Payer: Medicare Other | Source: Ambulatory Visit | Attending: Radiation Oncology | Admitting: Radiation Oncology

## 2016-01-02 DIAGNOSIS — C321 Malignant neoplasm of supraglottis: Secondary | ICD-10-CM | POA: Diagnosis not present

## 2016-01-02 LAB — GLUCOSE, CAPILLARY: GLUCOSE-CAPILLARY: 100 mg/dL — AB (ref 65–99)

## 2016-01-02 MED ORDER — FLUDEOXYGLUCOSE F - 18 (FDG) INJECTION
6.5800 | Freq: Once | INTRAVENOUS | Status: AC | PRN
  Administered 2016-01-02: 6.58 via INTRAVENOUS

## 2016-01-02 NOTE — Progress Notes (Signed)
Ms. Bacarella is here for follow up of radiation completed 09/08/15 to her Supraglottis and bilateral neck to get the results of her 01/02/16 PET scan.   Pain issues, if any: She reports chronic pain to her throat. She takes 5-36ml Hycet about twice daily.  Using a feeding tube?: Yes. She is instilling 4 cans of osmolite daily and is not sure of the amount of water. Weight changes, if any: She reports her weight has been stable Wt Readings from Last 3 Encounters:  01/04/16 132 lb 6.4 oz (60.1 kg)  12/20/15 131 lb 9.6 oz (59.7 kg)  12/12/15 133 lb 6.4 oz (60.5 kg)   Swallowing issues, if any: She reports having to plan her swallowing, and believes it is from lack of saliva. She is drinking liquids like water and coffee. She occasionally will try some solid food and tells me she ate pancakes and sausage this past weekend. She did have to moisten her food well to swallow.  Smoking or chewing tobacco? No Using fluoride trays daily? Yes, daily Last ENT visit was on: Dr. [name] 06/10/15. She has a scheduled appointment with Dr. [name] in mid December.  Other notable issues, if any:  She reports a bloody type of drainage from her PEG site. This has been occurring since she had a procedure done in IR that she states was "cauterizing it" because of a "bump" she had there.  She is attending PT for swelling to her right and anterior neck. She feels like it is helping her.   BP 139/86   Pulse 84   Temp 97.7 F (36.5 C)   Ht 5\' 6"  (1.676 m)   Wt 132 lb 6.4 oz (60.1 kg)   SpO2 100% Comment: room air  BMI 21.37 kg/m

## 2016-01-03 ENCOUNTER — Ambulatory Visit: Payer: Medicare Other | Admitting: Physical Therapy

## 2016-01-03 ENCOUNTER — Encounter: Payer: Self-pay | Admitting: Physical Therapy

## 2016-01-03 DIAGNOSIS — I890 Lymphedema, not elsewhere classified: Secondary | ICD-10-CM

## 2016-01-03 DIAGNOSIS — R1310 Dysphagia, unspecified: Secondary | ICD-10-CM | POA: Diagnosis not present

## 2016-01-03 DIAGNOSIS — L599 Disorder of the skin and subcutaneous tissue related to radiation, unspecified: Secondary | ICD-10-CM | POA: Diagnosis not present

## 2016-01-03 DIAGNOSIS — M6249 Contracture of muscle, multiple sites: Secondary | ICD-10-CM | POA: Diagnosis not present

## 2016-01-03 NOTE — Therapy (Signed)
[place], [place], [address] Phone: [phone]   Fax:  [phone]  Physical Therapy Treatment  Patient Details  Name: Renee [name] MRN: [id number] Date of Birth: 1934-09-22 Referring Provider: Dr. Eppie Gibson  Encounter Date: 01/03/2016      PT End of Session - 01/03/16 1205    Visit Number 3   Number of Visits 9   Date for PT Re-Evaluation 01/27/16   PT Start Time 1106   PT Stop Time 1146   PT Time Calculation (min) 40 min   Activity Tolerance Patient tolerated treatment well   Behavior During Therapy [facility] for tasks assessed/performed      Past Medical History:  Diagnosis Date  . Anxiety    takes Xanax as needed  . Arthritis   . Brain aneurysm   . Chronic back pain    buldging disc  . Chronic bronchitis ([place])   . Chronic diastolic CHF (congestive heart failure) (HCC)    was on Lasix but has been off for a few months  . Chronic respiratory failure ([place])   . Coronary artery disease    a. CABG 09/2009. b. normal myoview in 05/2012.   [name] Dysphagia   . Emphysema lung ([place])   . Emphysema/COPD ([place])    on home O2  . GERD (gastroesophageal reflux disease)    takes Nexium daily  . History of colon polyps    benign  . History of radiation therapy 07/19/15- 09/08/15   Supraglottis and bilateral neck.   . History of shingles   . History of vertigo    doesn't take any meds  . HLD (hyperlipidemia)    can't take meds d/t joint aches/pains  . Hypertension   . Hypothyroidism    takes Synthroid daily  . IBS (irritable bowel syndrome)   . Internal hemorrhoids   . Macular degeneration    dry  . Myocardial infarction 2009   takes Eliquis daily.  . Orthostatic hypotension   . Osteoporosis   . PAF (paroxysmal atrial fibrillation) ([place])    a. Post op after CABG. Intolerant of amio in the past. b. Recurrence - started on Tikosyn 11/2013.  [name] Pneumonia    several yrs ago  . PONV (postoperative  nausea and vomiting)   . RBBB    takes Metoporolol daily  . Sciatic nerve pain     Past Surgical History:  Procedure Laterality Date  . APPENDECTOMY  1995  . ATRIAL FIBRILLATION ABLATION N/A 05/11/2014   Procedure: ATRIAL FIBRILLATION ABLATION;  Surgeon: Thompson Grayer, MD;  Location: [facility] CATH LAB;  Service: Cardiovascular;  Laterality: N/A;  . CARDIAC CATHETERIZATION  1990's; 06/2009  . CATARACT EXTRACTION W/ INTRAOCULAR LENS  IMPLANT, BILATERAL Bilateral   . CHOLECYSTECTOMY  2008  . colonosocpy    . CORONARY ARTERY BYPASS GRAFT  09/21/2009   CABG X2  . DIRECT LARYNGOSCOPY N/A 06/03/2015   Procedure: Direct laryngoscopy with biopsy left epiglottic lesion;  Surgeon: [name], MD;  Location: [place];  Service: ENT;  Laterality: N/A;  . ESOPHAGOGASTRODUODENOSCOPY    . IR GENERIC HISTORICAL  11/15/2015   IR RADIOLOGIST EVAL & MGMT 11/15/2015 Saverio Danker, PA-C [facility]-INTERV RAD  . JOINT REPLACEMENT    . LARYNX SURGERY  ~ 1960   "tumor removed"  . RETINAL LASER PROCEDURE Left 10/19/2013   "had a wrinkle in it"  . TEE WITHOUT CARDIOVERSION N/A 05/10/2014   Procedure: TRANSESOPHAGEAL ECHOCARDIOGRAM (TEE);  Surgeon: Sueanne Margarita, MD;  Location: [facility] ENDOSCOPY;  Service: Cardiovascular;  Laterality: N/A;  . TONSILLECTOMY AND ADENOIDECTOMY    . TOTAL ABDOMINAL HYSTERECTOMY  1986  . TOTAL HIP ARTHROPLASTY Right 11/01/2008  . TUBAL LIGATION  1973    There were no vitals filed for this visit.      Subjective Assessment - 01/03/16 1109    Subjective Yesterday and today my swelling is a little bit more. I wear the chip pack 2.5 hrs a day.    Patient is accompained by: Family member   Pertinent History Pt. 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  Had lost 25 lbs. prior to treatment, but is now about the same weight as at that time.  Completed XRT to supraglottis and bilateral neck 09/08/15. h/o right THA 6-7 years ago; CABG, A fib  ablation, HTN, chronic backpain, emphysema, h/o vertigo episode, orthostatic hypotension.  h/o two falls in each of which she fractured a patella, in the last 15 years.   Patient Stated Goals get help to treat lymphedema so it doesn't get any worse   Currently in Pain? Yes   Pain Score 5    Pain Location Throat   Pain Descriptors / Indicators Constant               LYMPHEDEMA/ONCOLOGY QUESTIONNAIRE - 01/03/16 1112      Head and Neck   4 cm superior to sternal notch around neck 35 cm   6 cm superior to sternal notch around neck 34 cm   8 cm superior to sternal notch around neck 35 cm                  [facility] Adult PT Treatment/Exercise - 01/03/16 0001      Manual Therapy   Manual Therapy Manual Lymphatic Drainage (MLD)   Manual Lymphatic Drainage (MLD) Seated in front of mirror: Instructed patient and had pt demonstrate technique throughout treatment: supraclavicular fossae, bilat. axillae, and bilat. shoulder collectors; posterolateral, lateral, anterolateral, and anterior neck; chin, cheeks, and preauricular nodes; then followed pathways down to axillae                        Long Term Clinic Goals - 01/03/16 1203      CC Long Term Goal  #1   Title Patient will be knowledgeable about how and where to obtain a neck compression garment and its appropriate use   Time 4   Period Weeks   Status On-going     CC Long Term Goal  #2   Title Patient will be independent in self-manual lymph drainage techniques   Baseline 01/03/16- instructed pt today   Time 4   Period Weeks   Status On-going     CC Long Term Goal  #3   Title neck circumference at 6 cm. proximal to sternal notch will be reduced to 34 cm. or less   Baseline 34.7 cm. 01/03/16- 34 cm   Time 4   Period Weeks   Status Achieved         Head and Neck Clinic Goals - 06/28/15 0908      Patient will be able to verbalize understanding of a home exercise program for cervical range of  motion, posture, and walking.    Status Achieved     Patient will be able to verbalize understanding of proper sitting and standing posture.    Status Achieved  Patient will be able to verbalize understanding of lymphedema risk and availability of treatment for this condition.    Status Achieved           Plan - 01/03/16 1206    Clinical Impression Statement Instructed pt today in self drainage technique today and had pt demonstrate technique in front of the mirror. Pt did well with first practice of self drainage. Issued handout with instructions and asked pt to complete self drainage at least once a day for 20 min each time.    Rehab Potential Good   Clinical Impairments Affecting Rehab Potential none   PT Frequency 2x / week   PT Duration 4 weeks   PT Treatment/Interventions ADLs/Self Care Home Management;DME Instruction;Patient/family education;Orthotic Fit/Training;Manual techniques;Manual lymph drainage;Passive range of motion   PT Next Visit Plan assess indep with self-manual lymph drainage, discuss manufactured compression garment options.   PT Home Exercise Plan neck AROM   Consulted and Agree with Plan of Care Patient      Patient will benefit from skilled therapeutic intervention in order to improve the following deficits and impairments:  Decreased range of motion, Decreased knowledge of use of DME, Decreased knowledge of precautions, Increased edema, Decreased mobility  Visit Diagnosis: Lymphedema, not elsewhere classified     Problem List Patient Active Problem List   Diagnosis Date Noted  . Acute bronchitis 08/17/2015  . Carcinoma of epiglottis ([place]) 06/24/2015  . Chronic obstructive pulmonary disease ([place]) 03/16/2015  . Moderate COPD (chronic obstructive pulmonary disease) ([place]) 03/16/2015  . COPD with chronic bronchitis ([place]) 12/13/2014  . Bibasilar crackles 12/13/2014  . Irritated throat 12/13/2014  . Chronic respiratory failure with  hypoxia ([place]) 08/18/2014  . Cough 05/19/2014  . A-fib ([place]) 05/11/2014  . Orthostatic hypotension 01/18/2014  . HLD (hyperlipidemia)   . Hypertension   . Dizziness 11/30/2013  . Paroxysmal a-fib ([place]) 12/04/2012  . Palpitations 11/23/2011  . Hypothyroidism   . 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 IBS (irritable bowel syndrome)   . Bundle branch block, right   . Coronary artery disease   . Fatigue   . Bruises easily   . Joint pain   . Anxiety   . Chronic diastolic CHF (congestive heart failure) ([place]) 12/07/2009  . COPD (chronic obstructive pulmonary disease) ([place]) 12/07/2009  . GERD 06/27/2009  . PERSONAL HX COLONIC POLYPS 06/27/2009    Alexia Freestone 01/03/2016, 12:09 PM  [place], [place], [address] Phone: [phone]   Fax:  [phone]  Name: Renee [name] MRN: [id number] Date of Birth: 1934-02-13   Allyson Sabal, PT 01/03/16 12:09 PM

## 2016-01-04 ENCOUNTER — Encounter: Payer: Self-pay | Admitting: *Deleted

## 2016-01-04 ENCOUNTER — Encounter: Payer: Self-pay | Admitting: Radiation Oncology

## 2016-01-04 ENCOUNTER — Ambulatory Visit
Admission: RE | Admit: 2016-01-04 | Discharge: 2016-01-04 | Disposition: A | Discharge status: Home / Self Care | Payer: Medicare Other | Source: Ambulatory Visit | Attending: Radiation Oncology | Admitting: Radiation Oncology

## 2016-01-04 VITALS — BP 139/86 | HR 84 | Temp 97.7°F | Ht 66.0 in | Wt 132.4 lb

## 2016-01-04 DIAGNOSIS — I700 Atherosclerosis of aorta: Secondary | ICD-10-CM | POA: Insufficient documentation

## 2016-01-04 DIAGNOSIS — R918 Other nonspecific abnormal finding of lung field: Secondary | ICD-10-CM | POA: Insufficient documentation

## 2016-01-04 DIAGNOSIS — R070 Pain in throat: Secondary | ICD-10-CM | POA: Diagnosis not present

## 2016-01-04 DIAGNOSIS — Z880 Allergy status to penicillin: Secondary | ICD-10-CM | POA: Insufficient documentation

## 2016-01-04 DIAGNOSIS — Z923 Personal history of irradiation: Secondary | ICD-10-CM | POA: Diagnosis not present

## 2016-01-04 DIAGNOSIS — C321 Malignant neoplasm of supraglottis: Secondary | ICD-10-CM

## 2016-01-04 HISTORY — DX: Personal history of irradiation: Z92.3

## 2016-01-04 MED ORDER — LARYNGOSCOPY SOLUTION RAD-ONC
15.0000 mL | Freq: Once | TOPICAL | Status: AC
  Administered 2016-01-04: 15 mL via TOPICAL
  Filled 2016-01-04: qty 15

## 2016-01-04 NOTE — Progress Notes (Signed)
Radiation Oncology         [phone] ________________________________  Name: Renee [name] MRN: [id number]  Date: 01/04/2016  DOB: 10/31/1934  Follow-Up Visit Note  CC: Renee Redwood, MD  Renee Montane, MD  Diagnosis and Prior Radiotherapy:    [id number] STAGE IVA epiglottic carcinoma  Radiation treatment dates:  07/19/2015-09/08/2015  Site/dose:    1. The supraglottis and bilateral neck were treated PTV High to 70 Gy in 35 fractions at 2 Gy per fraction.  2. The supraglottis and bilateral neck were treated PTV Med to 63 Gy in 35 fractions at 1.8 Gy per fraction. 3. The supraglottis and bilateral neck were treated PTV Low to 56 Gy in 35 fractions at 1.6 Gy per fraction.    ICD-9-CM ICD-10-CM   1. Carcinoma of epiglottis (HCC) 161.1 C32.1 laryngocopy solution for Rad-Onc     Fiberoptic laryngoscopy   CHIEF COMPLAINT: Tell me how I'm doing; I am hoarse    Narrative:  The patient returns today for routine follow up of radiation completed 09/08/15 to her Supraglottis and bilateral neck and to get the results of her 01/02/16 PET scan.  The patient reports chronic pain to her throat for which she takes 5-10 mL Hycet approximately twice daily. She is using a feeding tube. She instills 4 cans of osmolite daily. The patient reports her weight has been stable. She reports having to plan her swallowing, and believes it is from a lack of saliva. She is drinking liquids like water a coffee, and occasionally will try some solid food which she has to moisten well to swallow. Denies smoking or chewing tobacco. The patient reports she is using her fluoride trays daily. Her last ENT visit was with Dr. [name] on 06/10/15; she has an upcoming EMT appointment scheduled in mid December. The patient reports a bloody type of drainage from the PEG site that has been occurring since she had a procedure done in IR that she states was "cauterizing it" because of a "bump" she had there. She is attending PT for swelling to  her right and anterior neck, and feels that it is helping her. The patient reports she is taking Synthroid 50 mcg daily, up from 25 mcg previously, in response to thyroid tests per her PCP.         ALLERGIES:  is allergic to levofloxacin; penicillins; spiriva [tiotropium bromide monohydrate]; statins; budesonide-formoterol fumarate; morphine; phenazopyridine hcl; pneumococcal vaccines; prednisone; sulfa antibiotics; and sulfonamide derivatives.  Meds: Current Outpatient Prescriptions  Medication Sig Dispense Refill  . albuterol (PROAIR HFA) 108 (90 Base) MCG/ACT inhaler Inhale 2 puffs into the lungs every 6 (six) hours as needed for wheezing or shortness of breath.     . ALPRAZolam (XANAX) 0.25 MG tablet Take 0.125 mg by mouth 2 (two) times daily as needed for anxiety or sleep.     [name] apixaban (ELIQUIS) 5 MG TABS tablet Take 1 tablet (5 mg total) by mouth 2 (two) times daily. 60 tablet 1  . cholecalciferol (VITAMIN D) 1000 UNITS tablet Take 1,000 Units by mouth daily.    [name] HYDROcodone-acetaminophen (HYCET) 7.5-325 mg/15 ml solution Place 15 mLs into feeding tube every 4 (four) hours as needed for moderate pain (May take 97mL QHS and in the middle of the night.). 750 mL 0  . levothyroxine (SYNTHROID, LEVOTHROID) 25 MCG tablet Take 50 mcg by mouth daily before breakfast.     . metoprolol succinate (TOPROL-XL) 25 MG 24 hr tablet Take 1 tablet (25 mg total)  by mouth daily.    . nitroGLYCERIN (NITROSTAT) 0.4 MG SL tablet Place 0.4 mg under the tongue every 5 (five) minutes x 3 doses as needed for chest pain.     . Nutritional Supplements (FEEDING SUPPLEMENT, OSMOLITE 1.5 CAL,) LIQD Give 1 can of Osmolite 1.5 or equivalent - 5 times daily with 60 cc free water before and after bolus feeding via PEG.  Give additional 240 cc water BID. Send formula and supplies. 1185 mL 0  . ondansetron (ZOFRAN) 4 MG tablet Take 1 tablet (4 mg total) by mouth every 8 (eight) hours as needed for nausea or vomiting. 50 tablet 3   . OXYGEN Inhale 2 L into the lungs as directed. As needed for shortness of breath    . Polyethyl Glycol-Propyl Glycol (SYSTANE OP) Place 1 drop into both eyes 2 (two) times daily.     . polyethylene glycol (MIRALAX / GLYCOLAX) packet Take 17 g by mouth daily as needed.    . sodium fluoride (FLUORISHIELD) 1.1 % GEL dental gel Instill one drop of gel per tooth space of fluoride tray. Place over teeth for 5 minutes. Remove. Spit out excess. Repeat nightly. 120 mL prn  . acetaminophen (TYLENOL) 500 MG tablet Take 500 mg by mouth every 6 (six) hours as needed for moderate pain or headache.     . esomeprazole (NEXIUM) 40 MG capsule Take 40 mg by mouth daily before breakfast.      . lidocaine (XYLOCAINE) 2 % solution Mix 1 part 2%viscous lidocaine,1part H2O.Swish and/or swallow 59mL of this mixture,24min before meals and at bedtime, up to QID (Patient not taking: Reported on 01/04/2016) 100 mL 5  . senna (SENOKOT) 8.6 MG tablet Take 1 tablet by mouth daily as needed for constipation.      No current facility-administered medications for this encounter.     Physical Findings: The patient is in no acute distress. Patient is alert and oriented.  height is 5\' 6"  (1.676 m) and weight is 132 lb 6.4 oz (60.1 kg). Her temperature is 97.7 F (36.5 C). Her blood pressure is 139/86 and her pulse is 84. Her oxygen saturation is 100%. .  General: Alert and oriented, in no acute distress;   non toxic appearing; hoarse HEENT: Head is normocephalic. Extraocular movements are intact. Mucosa is moist. Upper dentures removed for oral examination. No evidence of thrush or lesions in the mouth or oropharynx. She has a brown coating to the surface of her tongue. Neck: Neck is notable for healing skin. Very mild lymphedema in the anterior neck. No palpable masses in the supraclavicular or subclavian region. Heart: Regular in rate and rhythm, no murmurs. Chest: Lungs are clear bilaterally. Extremities: No swelling or  lymphedema noted. Abdomen: PEG site was inspected with no sign of infection or adenopathy at this time Skin: Skin in treatment fields shows satisfactory healing- The skin over her neck is healing well, but there is still mild redness present.  Psychiatric: Judgment and insight are intact. Affect is appropriate.  PROCEDURE NOTE: After obtaining consent and anesthetizing the nasal cavity with topical lidocaine and phenylephrine, the flexible endoscope was introduced and passed through the nasal cavity: The epiglottis is completely gone. The pharynx and larynx show no sign of tumor. The true cords are symmetrically mobile.  Lab Findings: Lab Results  Component Value Date   WBC 6.0 08/22/2015   HGB 12.3 08/22/2015   HCT 39.0 08/22/2015   MCV 85.3 08/22/2015   PLT 406 (H) 08/22/2015  Lab Results  Component Value Date   TSH 1.98 01/18/2014    Radiographic Findings: Nm Pet Image Restag (ps) Skull Base To Thigh  Result Date: 01/02/2016 CLINICAL DATA:  Subsequent treatment strategy for epiglottic cancer. EXAM: NUCLEAR MEDICINE PET SKULL BASE TO THIGH TECHNIQUE: 6.6 mCi F-18 FDG was injected intravenously. Full-ring PET imaging was performed from the skull base to thigh after the radiotracer. CT data was obtained and used for attenuation correction and anatomic localization. FASTING BLOOD GLUCOSE:  Value: 100 mg/dl COMPARISON:  06/30/2015 and CT mac 05/20/2015. FINDINGS: NECK Residual hypermetabolism is seen along the right epiglottis, extending inferiorly to the vocal cords, asymmetric on the right with an SUV max of 12.1. No definite CT correlate. No hypermetabolic lymph nodes. There may be a left mastoid effusion. CHEST New irregular airspace consolidation is seen in the apical segment right upper lobe (CT image 10 of series 7) with mild associated hypermetabolism. No hypermetabolic mediastinal, hilar or axillary lymph nodes. Centrilobular emphysema. Atherosclerotic calcification of the arterial  vasculature. No pericardial or pleural effusion. ABDOMEN/PELVIS No abnormal hypermetabolism in the liver, adrenal glands, spleen or pancreas. No hypermetabolic lymph nodes. Liver, adrenal glands, kidneys, spleen and pancreas are grossly unremarkable. Cholecystectomy. Percutaneous gastrostomy is in place. Atherosclerotic calcification of the arterial vasculature without abdominal aortic aneurysm. SKELETON No abnormal osseous hypermetabolism.  Right hip arthroplasty. IMPRESSION: 1. Residual, but improved, hypermetabolism associated with the right epiglottis, extending inferiorly to the vocal cords. No hypermetabolic adenopathy. 2. New irregular airspace consolidation in the apical segment right upper lobe, mildly hypermetabolic, likely infectious or inflammatory in etiology. Attention on followup exams is warranted. 3.  Aortic atherosclerosis (ICD10-170.0). Electronically Signed   By: Lorin Picket M.D.   On: 01/02/2016 10:50    Impression/Plan:   1) Head and Neck Cancer Status: healing from RT - NED. PET has some residual hypermetabolic activity at primary site but I favor this is inflammatory.  Entire epiglottis has eroded from tumor resolution. We will keep an eye on her.  2) Nutritional Status: weight stable. Continue using PEG prn and follow with SLP Wt Readings from Last 3 Encounters:  01/04/16 132 lb 6.4 oz (60.1 kg)  12/20/15 131 lb 9.6 oz (59.7 kg)  12/12/15 133 lb 6.4 oz (60.5 kg)   3) Risk Factors: The patient has been educated about risk factors including alcohol and tobacco abuse; they understand that avoidance of alcohol and tobacco is important to prevent recurrences as well as other cancers. (abstaining )  4) Swallowing: following with SLP.  Limited function but can swallow some liquids/foods. NO epiglottis.   5) Dental: Encouraged to continue regular followup with dentistry, and dental hygiene including fluoride rinses.    6) Thyroid function: Lab Results  Component Value Date     TSH 1.98 01/18/2014  She had preceding hypothyroidism which will continue to be managed by her PCP.  7) Other: I discussed with the patient that she should expect to have the PEG indefinitely. Without her epiglottis, the patient may need to continue instilling some to all of her nutrition through the tube.  8) Follow-up with me in 3-4 months. I encouraged patient to call me with any concerns or questions that might arise before then. ENT f/u in 2 weeks.  _____________________________________   Eppie Gibson, MD  This document serves as a record of services personally performed by Eppie Gibson, MD. It was created on her behalf by Maryla Morrow, a trained medical scribe. The creation of this record is based on  the scribe's personal observations and the provider's statements to them. This document has been checked and approved by the attending provider.

## 2016-01-05 ENCOUNTER — Encounter: Payer: Self-pay | Admitting: Physical Therapy

## 2016-01-05 ENCOUNTER — Ambulatory Visit: Payer: Medicare Other | Admitting: Physical Therapy

## 2016-01-05 DIAGNOSIS — R1310 Dysphagia, unspecified: Secondary | ICD-10-CM | POA: Diagnosis not present

## 2016-01-05 DIAGNOSIS — I890 Lymphedema, not elsewhere classified: Secondary | ICD-10-CM | POA: Diagnosis not present

## 2016-01-05 DIAGNOSIS — L599 Disorder of the skin and subcutaneous tissue related to radiation, unspecified: Secondary | ICD-10-CM | POA: Diagnosis not present

## 2016-01-05 DIAGNOSIS — M6249 Contracture of muscle, multiple sites: Secondary | ICD-10-CM | POA: Diagnosis not present

## 2016-01-05 NOTE — Therapy (Signed)
[place], [place], [address] Phone: [phone]   Fax:  [phone]  Physical Therapy Treatment  Patient Details  Name: Renee [name] MRN: [id number] Date of Birth: 01-24-1935 Referring Provider: Dr. Eppie Gibson  Encounter Date: 01/05/2016      PT End of Session - 01/05/16 1218    Visit Number 4   Number of Visits 9   Date for PT Re-Evaluation 01/27/16   PT Start Time 1021   PT Stop Time 1104   PT Time Calculation (min) 43 min   Activity Tolerance Patient tolerated treatment well   Behavior During Therapy [facility] for tasks assessed/performed      Past Medical History:  Diagnosis Date  . Anxiety    takes Xanax as needed  . Arthritis   . Brain aneurysm   . Chronic back pain    buldging disc  . Chronic bronchitis ([place])   . Chronic diastolic CHF (congestive heart failure) (HCC)    was on Lasix but has been off for a few months  . Chronic respiratory failure ([place])   . Coronary artery disease    a. CABG 09/2009. b. normal myoview in 05/2012.   [name] Dysphagia   . Emphysema lung ([place])   . Emphysema/COPD ([place])    on home O2  . GERD (gastroesophageal reflux disease)    takes Nexium daily  . History of colon polyps    benign  . History of radiation therapy 07/19/15- 09/08/15   Supraglottis and bilateral neck.   . History of shingles   . History of vertigo    doesn't take any meds  . HLD (hyperlipidemia)    can't take meds d/t joint aches/pains  . Hypertension   . Hypothyroidism    takes Synthroid daily  . IBS (irritable bowel syndrome)   . Internal hemorrhoids   . Macular degeneration    dry  . Myocardial infarction 2009   takes Eliquis daily.  . Orthostatic hypotension   . Osteoporosis   . PAF (paroxysmal atrial fibrillation) ([place])    a. Post op after CABG. Intolerant of amio in the past. b. Recurrence - started on Tikosyn 11/2013.  [name] Pneumonia    several yrs ago  . PONV (postoperative  nausea and vomiting)   . RBBB    takes Metoporolol daily  . Sciatic nerve pain     Past Surgical History:  Procedure Laterality Date  . APPENDECTOMY  1995  . ATRIAL FIBRILLATION ABLATION N/A 05/11/2014   Procedure: ATRIAL FIBRILLATION ABLATION;  Surgeon: Thompson Grayer, MD;  Location: [facility] CATH LAB;  Service: Cardiovascular;  Laterality: N/A;  . CARDIAC CATHETERIZATION  1990's; 06/2009  . CATARACT EXTRACTION W/ INTRAOCULAR LENS  IMPLANT, BILATERAL Bilateral   . CHOLECYSTECTOMY  2008  . colonosocpy    . CORONARY ARTERY BYPASS GRAFT  09/21/2009   CABG X2  . DIRECT LARYNGOSCOPY N/A 06/03/2015   Procedure: Direct laryngoscopy with biopsy left epiglottic lesion;  Surgeon: [name], MD;  Location: [facility];  Service: ENT;  Laterality: N/A;  . ESOPHAGOGASTRODUODENOSCOPY    . IR GENERIC HISTORICAL  11/15/2015   IR RADIOLOGIST EVAL & MGMT 11/15/2015 Saverio Danker, PA-C [facility]-INTERV RAD  . JOINT REPLACEMENT    . LARYNX SURGERY  ~ 1960   "tumor removed"  . RETINAL LASER PROCEDURE Left 10/19/2013   "had a wrinkle in it"  . TEE WITHOUT CARDIOVERSION N/A 05/10/2014   Procedure: TRANSESOPHAGEAL ECHOCARDIOGRAM (TEE);  Surgeon: Sueanne Margarita, MD;  Location: [facility] ENDOSCOPY;  Service: Cardiovascular;  Laterality: N/A;  . TONSILLECTOMY AND ADENOIDECTOMY    . TOTAL ABDOMINAL HYSTERECTOMY  1986  . TOTAL HIP ARTHROPLASTY Right 11/01/2008  . TUBAL LIGATION  1973    There were no vitals filed for this visit.      Subjective Assessment - 01/05/16 1045    Subjective I had a hard time with the massage. I couldn't remember how to do it.    Patient is accompained by: Family member   Pertinent History Pt. 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  Had lost 25 lbs. prior to treatment, but is now about the same weight as at that time.  Completed XRT to supraglottis and bilateral neck 09/08/15. h/o right THA 6-7 years ago; CABG, A fib ablation, HTN,  chronic backpain, emphysema, h/o vertigo episode, orthostatic hypotension.  h/o two falls in each of which she fractured a patella, in the last 15 years.   Patient Stated Goals get help to treat lymphedema so it doesn't get any worse   Currently in Pain? Yes   Pain Score 4    Pain Location Throat   Pain Descriptors / Indicators Constant                         [facility] Adult PT Treatment/Exercise - 01/05/16 0001      Manual Therapy   Manual Therapy Manual Lymphatic Drainage (MLD)   Manual Lymphatic Drainage (MLD) Seated in front of mirror: Instructed patient while having pt demonstrate technique throughout treatment: supraclavicular fossae, bilat. axillae, and bilat. shoulder collectors; posterolateral, lateral, anterolateral, and anterior neck; chin, cheeks, and preauricular nodes; then followed pathways down to axillae                        Long Term Clinic Goals - 01/03/16 1203      CC Long Term Goal  #1   Title Patient will be knowledgeable about how and where to obtain a neck compression garment and its appropriate use   Time 4   Period Weeks   Status On-going     CC Long Term Goal  #2   Title Patient will be independent in self-manual lymph drainage techniques   Baseline 01/03/16- instructed pt today   Time 4   Period Weeks   Status On-going     CC Long Term Goal  #3   Title neck circumference at 6 cm. proximal to sternal notch will be reduced to 34 cm. or less   Baseline 34.7 cm. 01/03/16- 34 cm   Time 4   Period Weeks   Status Achieved         Head and Neck Clinic Goals - 06/28/15 0908      Patient will be able to verbalize understanding of a home exercise program for cervical range of motion, posture, and walking.    Status Achieved     Patient will be able to verbalize understanding of proper sitting and standing posture.    Status Achieved     Patient will be able to verbalize understanding of lymphedema risk and  availability of treatment for this condition.    Status Achieved           Plan - 01/05/16 1218    Clinical Impression Statement Pt stated she had a really hard time with the self drainage at home  and was confused. Pt was instructed in self drainage technique and demonstrated all techniques with therapist educating pt in proper technique and direction of stretch. Pt felt better about techique at end of session. Did not have time to educated about different garments this visit.    Rehab Potential Good   Clinical Impairments Affecting Rehab Potential none   PT Frequency 2x / week   PT Duration 4 weeks   PT Treatment/Interventions ADLs/Self Care Home Management;DME Instruction;Patient/family education;Orthotic Fit/Training;Manual techniques;Manual lymph drainage;Passive range of motion   PT Next Visit Plan assess indep with self-manual lymph drainage, discuss manufactured compression garment options.   PT Home Exercise Plan neck AROM   Consulted and Agree with Plan of Care Patient      Patient will benefit from skilled therapeutic intervention in order to improve the following deficits and impairments:  Decreased range of motion, Decreased knowledge of use of DME, Decreased knowledge of precautions, Increased edema, Decreased mobility  Visit Diagnosis: Lymphedema, not elsewhere classified     Problem List Patient Active Problem List   Diagnosis Date Noted  . Acute bronchitis 08/17/2015  . Carcinoma of epiglottis ([place]) 06/24/2015  . Chronic obstructive pulmonary disease ([place]) 03/16/2015  . Moderate COPD (chronic obstructive pulmonary disease) ([place]) 03/16/2015  . COPD with chronic bronchitis ([place]) 12/13/2014  . Bibasilar crackles 12/13/2014  . Irritated throat 12/13/2014  . Chronic respiratory failure with hypoxia ([place]) 08/18/2014  . Cough 05/19/2014  . A-fib ([place]) 05/11/2014  . Orthostatic hypotension 01/18/2014  . HLD (hyperlipidemia)   . Hypertension   . Dizziness  11/30/2013  . Paroxysmal a-fib ([place]) 12/04/2012  . Palpitations 11/23/2011  . Hypothyroidism   . 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 IBS (irritable bowel syndrome)   . Bundle branch block, right   . Coronary artery disease   . Fatigue   . Bruises easily   . Joint pain   . Anxiety   . Chronic diastolic CHF (congestive heart failure) ([place]) 12/07/2009  . COPD (chronic obstructive pulmonary disease) ([place]) 12/07/2009  . GERD 06/27/2009  . PERSONAL HX COLONIC POLYPS 06/27/2009    Alexia Freestone 01/05/2016, 12:20 PM  [place], [place], [address] Phone: [phone]   Fax:  [phone]  Name: ANGELL BELVIN MRN: [id number] Date of Birth: [birth year]   Allyson Sabal, PT 01/05/16 12:21 PM

## 2016-01-05 NOTE — Progress Notes (Signed)
Oncology Nurse Navigator Documentation  Met with Renee [name] and her husband during follow-up appt with Dr. [name].  Renee [name] completed XRT for epiglottic carcinoma 09/08/15. They voiced understanding of Dr. Pearlie Oyster discussion of 01/02/16 restaging PET. I assisted with laryngoscopy, she tolerated procedure well. I discussed with them next April's [facility] and [facility], encouraged them to attend.  They were receptive to information, understand additional information will be provided as dates approach. They understand I can be contacted with needs/concerns.  Renee Orem, RN, BSN, [place] [facility] On[facility] Nurse [place] [facility] [place] [phone]

## 2016-01-06 ENCOUNTER — Telehealth: Payer: Self-pay | Admitting: *Deleted

## 2016-01-06 NOTE — Telephone Encounter (Signed)
CALLED PATIENT TO INFORM OF FU VISIT WITH DR. [name] ON 04-20-16 @ 2 PM, SPOKE WITH PATIENT AND SHE IS AWARE OF THIS APPT.

## 2016-01-09 ENCOUNTER — Ambulatory Visit: Payer: Self-pay | Admitting: Adult Health

## 2016-01-10 ENCOUNTER — Ambulatory Visit: Payer: Medicare Other | Attending: Radiation Oncology | Admitting: Physical Therapy

## 2016-01-10 DIAGNOSIS — M6249 Contracture of muscle, multiple sites: Secondary | ICD-10-CM | POA: Diagnosis not present

## 2016-01-10 DIAGNOSIS — R1310 Dysphagia, unspecified: Secondary | ICD-10-CM | POA: Insufficient documentation

## 2016-01-10 DIAGNOSIS — I890 Lymphedema, not elsewhere classified: Secondary | ICD-10-CM

## 2016-01-10 DIAGNOSIS — L599 Disorder of the skin and subcutaneous tissue related to radiation, unspecified: Secondary | ICD-10-CM | POA: Insufficient documentation

## 2016-01-10 NOTE — Therapy (Signed)
[place], [place], [address] Phone: [phone]   Fax:  [phone]  Physical Therapy Treatment  Patient Details  Name: Renee [name] MRN: [id number] Date of Birth: 1934-11-03 Referring Provider: Dr. Eppie Gibson  Encounter Date: 01/10/2016      PT End of Session - 01/10/16 1449    Visit Number 5   Number of Visits 9   Date for PT Re-Evaluation 01/27/16   PT Start Time 1302   PT Stop Time 1350   PT Time Calculation (min) 48 min   Activity Tolerance Patient tolerated treatment well   Behavior During Therapy [facility] for tasks assessed/performed      Past Medical History:  Diagnosis Date  . Anxiety    takes Xanax as needed  . Arthritis   . Brain aneurysm   . Chronic back pain    buldging disc  . Chronic bronchitis ([place])   . Chronic diastolic CHF (congestive heart failure) (HCC)    was on Lasix but has been off for a few months  . Chronic respiratory failure ([place])   . Coronary artery disease    a. CABG 09/2009. b. normal myoview in 05/2012.   [name] Dysphagia   . Emphysema lung ([place])   . Emphysema/COPD ([place])    on home O2  . GERD (gastroesophageal reflux disease)    takes Nexium daily  . History of colon polyps    benign  . History of radiation therapy 07/19/15- 09/08/15   Supraglottis and bilateral neck.   . History of shingles   . History of vertigo    doesn't take any meds  . HLD (hyperlipidemia)    can't take meds d/t joint aches/pains  . Hypertension   . Hypothyroidism    takes Synthroid daily  . IBS (irritable bowel syndrome)   . Internal hemorrhoids   . Macular degeneration    dry  . Myocardial infarction 2009   takes Eliquis daily.  . Orthostatic hypotension   . Osteoporosis   . PAF (paroxysmal atrial fibrillation) ([place])    a. Post op after CABG. Intolerant of amio in the past. b. Recurrence - started on Tikosyn 11/2013.  [name] Pneumonia    several yrs ago  . PONV (postoperative  nausea and vomiting)   . RBBB    takes Metoporolol daily  . Sciatic nerve pain     Past Surgical History:  Procedure Laterality Date  . APPENDECTOMY  1995  . ATRIAL FIBRILLATION ABLATION N/A 05/11/2014   Procedure: ATRIAL FIBRILLATION ABLATION;  Surgeon: Thompson Grayer, MD;  Location: [facility] CATH LAB;  Service: Cardiovascular;  Laterality: N/A;  . CARDIAC CATHETERIZATION  1990's; 06/2009  . CATARACT EXTRACTION W/ INTRAOCULAR LENS  IMPLANT, BILATERAL Bilateral   . CHOLECYSTECTOMY  2008  . colonosocpy    . CORONARY ARTERY BYPASS GRAFT  09/21/2009   CABG X2  . DIRECT LARYNGOSCOPY N/A 06/03/2015   Procedure: Direct laryngoscopy with biopsy left epiglottic lesion;  Surgeon: [name], MD;  Location: [facility];  Service: ENT;  Laterality: N/A;  . ESOPHAGOGASTRODUODENOSCOPY    . IR GENERIC HISTORICAL  11/15/2015   IR RADIOLOGIST EVAL & MGMT 11/15/2015 Saverio Danker, PA-C [facility]-INTERV RAD  . JOINT REPLACEMENT    . LARYNX SURGERY  ~ 1960   "tumor removed"  . RETINAL LASER PROCEDURE Left 10/19/2013   "had a wrinkle in it"  . TEE WITHOUT CARDIOVERSION N/A 05/10/2014   Procedure: TRANSESOPHAGEAL ECHOCARDIOGRAM (TEE);  Surgeon: Sueanne Margarita, MD;  [facility];  Service: Cardiovascular;  Laterality: N/A;  . TONSILLECTOMY AND ADENOIDECTOMY    . TOTAL ABDOMINAL HYSTERECTOMY  1986  . TOTAL HIP ARTHROPLASTY Right 11/01/2008  . TUBAL LIGATION  1973    There were no vitals filed for this visit.      Subjective Assessment - 01/10/16 1305    Subjective "Had my CAT scan; all the cancer is gone except for one spot she's not sure of.  But my epiglottis is gone. I'll be on the feeding tube forever."  Reports that the compression garment is helping, but she does not believe in the manual lymph drainage and is having trouble trying to do it.   Currently in Pain? Yes   Pain Score 4    Pain Location Throat   Aggravating Factors  swallowing   Pain Relieving Factors not swallowing                LYMPHEDEMA/ONCOLOGY QUESTIONNAIRE - 01/10/16 1346      Head and Neck   4 cm superior to sternal notch around neck 34.4 cm   6 cm superior to sternal notch around neck 34.7 cm   8 cm superior to sternal notch around neck 35.8 cm                  [facility] Adult PT Treatment/Exercise - 01/10/16 0001      Manual Therapy   Manual Therapy Edema management   Edema Management circumference measurements taken before and after doing manual lymph drainage  treatment interrupted by patient taking notes on it   Manual Lymphatic Drainage (MLD) Seated in front of mirror: Instructed patient while having pt demonstrate technique throughout treatment: supraclavicular fossae, bilat. axillae, and bilat. shoulder collectors; posterolateral, lateral, anterolateral, and anterior neck; chin, cheeks, and preauricular nodes; then followed pathways down to axillae.  Also gave patient a different diagram of the procedure and patient wrote her own notes for the how to do self-manual lymph drainage.                PT Education - 01/10/16 1448    Education provided Yes   Education Details self-manual lymph drainage   Person(s) Educated Patient   Methods Explanation;Demonstration;Handout   Comprehension Verbalized understanding;Need further instruction                [facility] Goals - 01/03/16 1203      CC Long Term Goal  #1   Title Patient will be knowledgeable about how and where to obtain a neck compression garment and its appropriate use   Time 4   Period Weeks   Status On-going     CC Long Term Goal  #2   Title Patient will be independent in self-manual lymph drainage techniques   Baseline 01/03/16- instructed pt today   Time 4   Period Weeks   Status On-going     CC Long Term Goal  #3   Title neck circumference at 6 cm. proximal to sternal notch will be reduced to 34 cm. or less   Baseline 34.7 cm. 01/03/16- 34 cm   Time 4   Period Weeks   Status Achieved          [facility] Clinic Goals - 06/28/15 0908      Patient will be able to verbalize understanding of a home exercise program for cervical range of motion, posture, and walking.    Status Achieved     Patient will be  able to verbalize understanding of proper sitting and standing posture.    Status Achieved     Patient will be able to verbalize understanding of lymphedema risk and availability of treatment for this condition.    Status Achieved           Plan - 01/10/16 1449    Clinical Impression Statement Patient is feeling down today, a bit overwhelmed with trying to do self-manual lymph drainage (and she said she is not convinced it works), and depressed that a recent scan showed her epiglottis has been "eaten" by the treatment and that she will not be able to eat going forward.   Rehab Potential Good   Clinical Impairments Affecting Rehab Potential none   PT Frequency 2x / week   PT Duration 4 weeks   PT Treatment/Interventions ADLs/Self Care Home Management;DME Instruction;Patient/family education;Orthotic Fit/Training;Manual techniques;Manual lymph drainage;Passive range of motion   PT Next Visit Plan Check goals. Start next visit with talking about manufactured compression garment options.  Continue to review self-manual lymph drainage.   PT Home Exercise Plan neck AROM   Consulted and Agree with Plan of Care Patient      Patient will benefit from skilled therapeutic intervention in order to improve the following deficits and impairments:  Decreased range of motion, Decreased knowledge of use of DME, Decreased knowledge of precautions, Increased edema, Decreased mobility  Visit Diagnosis: Lymphedema, not elsewhere classified     Problem List Patient Active Problem List   Diagnosis Date Noted  . Acute bronchitis 08/17/2015  . 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 Chronic obstructive pulmonary disease ([place]) 03/16/2015  . Moderate COPD (chronic  obstructive pulmonary disease) ([place]) 03/16/2015  . COPD with chronic bronchitis ([place]) 12/13/2014  . Bibasilar crackles 12/13/2014  . Irritated throat 12/13/2014  . Chronic respiratory failure with hypoxia ([place]) 08/18/2014  . Cough 05/19/2014  . A-fib ([place]) 05/11/2014  . Orthostatic hypotension 01/18/2014  . HLD (hyperlipidemia)   . Hypertension   . Dizziness 11/30/2013  . Paroxysmal a-fib ([place]) 12/04/2012  . Palpitations 11/23/2011  . Hypothyroidism   . 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 IBS (irritable bowel syndrome)   . Bundle branch block, right   . Coronary artery disease   . Fatigue   . Bruises easily   . Joint pain   . Anxiety   . Chronic diastolic CHF (congestive heart failure) ([place]) 12/07/2009  . COPD (chronic obstructive pulmonary disease) ([place]) 12/07/2009  . GERD 06/27/2009  . PERSONAL HX COLONIC POLYPS 06/27/2009    [name],Renee 01/10/2016, 2:54 PM  [place], [place], [address] Phone: [phone]   Fax:  [phone]  Name: Renee [name] MRN: [id number] Date of Birth: 09/11/1934  Serafina Royals, PT 01/10/16 2:54 PM

## 2016-01-11 ENCOUNTER — Encounter: Payer: Self-pay | Admitting: Radiation Oncology

## 2016-01-11 NOTE — Progress Notes (Signed)
Pt presented at tumor board today.  No recommendations to repeat imaging currently. I think this is reasonable.  She should continue laryngoscopy for surveillance.  The apical right lung changes are most consistent with RT changes.   Will image in future as clinically indicated.  -----------------------------------  Eppie Gibson, MD

## 2016-01-12 ENCOUNTER — Encounter: Payer: Self-pay | Admitting: Physical Therapy

## 2016-01-12 DIAGNOSIS — R1310 Dysphagia, unspecified: Secondary | ICD-10-CM | POA: Diagnosis not present

## 2016-01-12 DIAGNOSIS — Z931 Gastrostomy status: Secondary | ICD-10-CM | POA: Diagnosis not present

## 2016-01-12 DIAGNOSIS — H6502 Acute serous otitis media, left ear: Secondary | ICD-10-CM | POA: Diagnosis not present

## 2016-01-12 DIAGNOSIS — Z923 Personal history of irradiation: Secondary | ICD-10-CM | POA: Diagnosis not present

## 2016-01-12 DIAGNOSIS — Z8521 Personal history of malignant neoplasm of larynx: Secondary | ICD-10-CM | POA: Diagnosis not present

## 2016-01-12 DIAGNOSIS — J387 Other diseases of larynx: Secondary | ICD-10-CM | POA: Diagnosis not present

## 2016-01-12 DIAGNOSIS — Z87891 Personal history of nicotine dependence: Secondary | ICD-10-CM | POA: Diagnosis not present

## 2016-01-16 ENCOUNTER — Telehealth: Payer: Self-pay | Admitting: *Deleted

## 2016-01-16 ENCOUNTER — Other Ambulatory Visit: Payer: Self-pay | Admitting: Adult Health

## 2016-01-16 ENCOUNTER — Ambulatory Visit: Payer: Medicare Other

## 2016-01-16 ENCOUNTER — Ambulatory Visit: Payer: Self-pay

## 2016-01-16 ENCOUNTER — Ambulatory Visit: Payer: Medicare Other | Admitting: Physical Therapy

## 2016-01-16 ENCOUNTER — Encounter: Payer: Self-pay | Admitting: Adult Health

## 2016-01-16 DIAGNOSIS — C321 Malignant neoplasm of supraglottis: Secondary | ICD-10-CM

## 2016-01-16 MED ORDER — HYDROCODONE-ACETAMINOPHEN 7.5-325 MG/15ML PO SOLN: 15.0000 mL | ORAL | 0 refills | Status: DC | PRN

## 2016-01-16 MED FILL — HYDROCOD-APAP 7.5-325/15ML: 7.5-325 | 8 days supply | Qty: 750 | Fill #0

## 2016-01-16 MED FILL — FLUORISHIELD 1.1% GEL: 1.1 % | 30 days supply | Qty: 114 | Fill #2

## 2016-01-16 NOTE — Telephone Encounter (Signed)
I will refill the Hycet and will leave the prescription with flush nurse upstairs in Med Onc.   Thanks, Liliane Channel!  Mike Craze, NP [place] [phone]

## 2016-01-16 NOTE — Progress Notes (Signed)
Hycet refilled. Prescription left with flush nurse for patient/caregiver to pick up after showing photo ID and signing for prescription, per [facility] policy.   Mike Craze, NP Survivorship Program [facility] [phone]   Below is the patient's [place] Controlled Substance Reporting System information. It is appropriate for the patient to continue to receive opiate medications for management of her H&N cancer pain.                            Review of Systems - Oncology  Physical Exam

## 2016-01-16 NOTE — Progress Notes (Signed)
Hycet refilled. Prescription left with flush nurse for patient/caregiver to pick up after showing photo ID and signing for prescription, per [facility] policy.   Mike Craze, NP Survivorship Program [facility] [phone]   Below is the patient's [place] Controlled Substance Reporting System information. It is appropriate for the patient to continue to receive opiate medications for management of her H&N cancer pain.

## 2016-01-16 NOTE — Telephone Encounter (Signed)
Oncology Nurse Navigator Documentation  Spoke with [name], notified her Hycet Rx available for pick-up in MedOnc with flush RN; I explained procedure.  [name], RN, BSN, [place] [facility] Oncology Nurse [place] at [place] [phone]

## 2016-01-16 NOTE — Telephone Encounter (Signed)
Oncology Nurse Navigator Documentation  Received call from [name].  She:  Asked for assistance in cancelling this afternoon's PT appt.  I later notified [name], [facility].  Needs HYCET refill.  Taking 4 times daily prior to meals for moderate pain.  Survivorship NP Mike Craze notified.  Indicated had 12/7 appt with Dr. [name].  He conducted laryngoscopy, findings confirm Dr. Pearlie Oyster 11/29 exam.  She will see him again 1/10 for follow-up.  PEG site redness has resolved, scant bleeding only.  She denied issues of concern. She understands I can be contacted with needs/concerns.  [name], RN, BSN, [place] [facility] Oncology Nurse [place] at [place] [phone]

## 2016-01-18 ENCOUNTER — Ambulatory Visit: Payer: Medicare Other | Admitting: Nutrition

## 2016-01-18 ENCOUNTER — Ambulatory Visit: Payer: Medicare Other | Admitting: Physical Therapy

## 2016-01-18 DIAGNOSIS — L599 Disorder of the skin and subcutaneous tissue related to radiation, unspecified: Secondary | ICD-10-CM

## 2016-01-18 DIAGNOSIS — I890 Lymphedema, not elsewhere classified: Secondary | ICD-10-CM | POA: Diagnosis not present

## 2016-01-18 DIAGNOSIS — M6249 Contracture of muscle, multiple sites: Secondary | ICD-10-CM

## 2016-01-18 NOTE — Therapy (Addendum)
[place], [place], [address] Phone: [phone]   Fax:  [phone]  Physical Therapy Treatment  Patient Details  Name: Renee [name] MRN: [id number] Date of Birth: 1934/03/04 Referring Provider: Dr. Eppie Gibson  Encounter Date: 01/18/2016      PT End of Session - 01/18/16 1242    Visit Number 6   Number of Visits 9   Date for PT Re-Evaluation 02/20/16   PT Start Time 1108   PT Stop Time 1204   PT Time Calculation (min) 56 min   Activity Tolerance Patient tolerated treatment well   Behavior During Therapy [facility] for tasks assessed/performed      Past Medical History:  Diagnosis Date  . Anxiety    takes Xanax as needed  . Arthritis   . Brain aneurysm   . Chronic back pain    buldging disc  . Chronic bronchitis ([place])   . Chronic diastolic CHF (congestive heart failure) (HCC)    was on Lasix but has been off for a few months  . Chronic respiratory failure ([place])   . Coronary artery disease    a. CABG 09/2009. b. normal myoview in 05/2012.   [name] Dysphagia   . Emphysema lung ([place])   . Emphysema/COPD ([place])    on home O2  . GERD (gastroesophageal reflux disease)    takes Nexium daily  . History of colon polyps    benign  . History of radiation therapy 07/19/15- 09/08/15   Supraglottis and bilateral neck.   . History of shingles   . History of vertigo    doesn't take any meds  . HLD (hyperlipidemia)    can't take meds d/t joint aches/pains  . Hypertension   . Hypothyroidism    takes Synthroid daily  . IBS (irritable bowel syndrome)   . Internal hemorrhoids   . Macular degeneration    dry  . Myocardial infarction 2009   takes Eliquis daily.  . Orthostatic hypotension   . Osteoporosis   . PAF (paroxysmal atrial fibrillation) ([place])    a. Post op after CABG. Intolerant of amio in the past. b. Recurrence - started on Tikosyn 11/2013.  [name] Pneumonia    several yrs ago  . PONV (postoperative  nausea and vomiting)   . RBBB    takes Metoporolol daily  . Sciatic nerve pain     Past Surgical History:  Procedure Laterality Date  . APPENDECTOMY  1995  . ATRIAL FIBRILLATION ABLATION N/A 05/11/2014   Procedure: ATRIAL FIBRILLATION ABLATION;  Surgeon: Thompson Grayer, MD;  Location: [facility] CATH LAB;  Service: Cardiovascular;  Laterality: N/A;  . CARDIAC CATHETERIZATION  1990's; 06/2009  . CATARACT EXTRACTION W/ INTRAOCULAR LENS  IMPLANT, BILATERAL Bilateral   . CHOLECYSTECTOMY  2008  . colonosocpy    . CORONARY ARTERY BYPASS GRAFT  09/21/2009   CABG X2  . DIRECT LARYNGOSCOPY N/A 06/03/2015   Procedure: Direct laryngoscopy with biopsy left epiglottic lesion;  Surgeon: [name], MD;  Location: [facility];  Service: ENT;  Laterality: N/A;  . ESOPHAGOGASTRODUODENOSCOPY    . IR GENERIC HISTORICAL  11/15/2015   IR RADIOLOGIST EVAL & MGMT 11/15/2015 Saverio Danker, PA-C [facility]-INTERV RAD  . JOINT REPLACEMENT    . LARYNX SURGERY  ~ 1960   "tumor removed"  . RETINAL LASER PROCEDURE Left 10/19/2013   "had a wrinkle in it"  . TEE WITHOUT CARDIOVERSION N/A 05/10/2014   Procedure: TRANSESOPHAGEAL ECHOCARDIOGRAM (TEE);  Surgeon: Sueanne Margarita, MD;  [facility] ENDOS[facility];  Service: Cardiovascular;  Laterality: N/A;  . TONSILLECTOMY AND ADENOIDECTOMY    . TOTAL ABDOMINAL HYSTERECTOMY  1986  . TOTAL HIP ARTHROPLASTY Right 11/01/2008  . TUBAL LIGATION  1973    There were no vitals filed for this visit.      Subjective Assessment - 01/18/16 1111    Subjective (P)  Still struggling to do the manual lymph drainage herself, and still unsure whether that's doing any good.  Wore the strap longer yesterday, and thinks that does some good.  Also doesn't swell as much if she doesn't do the tube feeding as late in the day.  Has another spot on the throat and is going to wait 30 days to have this followed up again, then perhaps biopsy if it's not healed. It's the right side of the vocal cord.   Currently in Pain? (P)  Yes    Pain Score (P)  4    Pain Location (P)  Throat   Pain Descriptors / Indicators (P)  Constant   Aggravating Factors  (P)  swallowing                         [facility] Adult PT Treatment/Exercise - 01/18/16 0001      Self-Care   Other Self-Care Comments  showed patient some samples and also pictures of neck compression garment options; she would like to pursue a JoviPak.  She was told about [email or web address] and local vendors; she wants to use [facility], so was given information about how to set up with them to be measured.  The size medium we have here of the JoviPak felt too tight to her, although her head and neck measurements fit into that size. Also gave information to patient about Flexitouch pump; she wants to wait to try the compression first before possibly trying the pump.     Manual Therapy   Manual Lymphatic Drainage (MLD) In supine with head of bed elevated:  supraclavicular fossae, bilateral axillae, bilat. shoulder collectors; posterolateral, lateral, anterolateral, and anterior neck; chin, cheeks, and preauricular nodes, then followed pathways back to axillae.                PT Education - 01/18/16 1242    Education provided Yes   Education Details about neck compression garments and about Flexitouch pump   Person(s) Educated Patient   Methods Explanation;Handout   Comprehension Verbalized understanding                [facility] Goals - 01/18/16 [place] Term Goal  #1   Title Patient will be knowledgeable about how and where to obtain a neck compression garment and its appropriate use   Baseline As of 01/18/16, patient plans to go to [facility] for measurement for a JoviPak neck garment.   Status Partially Met     CC Long Term Goal  #2   Title Patient will be independent in self-manual lymph drainage techniques   Status On-going     CC Long Term Goal  #3   Title neck circumference at 6 cm. proximal to  sternal notch will be reduced to 34 cm. or less   Status Achieved         Head and Neck Clinic Goals - 06/28/15 0908      Patient will be able to verbalize understanding of a home exercise program for cervical range of motion, posture, and  walking.    Status Achieved     Patient will be able to verbalize understanding of proper sitting and standing posture.    Status Achieved     Patient will be able to verbalize understanding of lymphedema risk and availability of treatment for this condition.    Status Achieved           Plan - 01/18/16 1243    Clinical Impression Statement Patient first says she is still unsure about doing manual lymph drainage, but later in the session said she was doing okay with it.  Much of session was spent on exploring options for longterm self care with garments and perhaps a pump.  Therapist also performed manual lymph drainage today.   Rehab Potential Good   Clinical Impairments Affecting Rehab Potential none   PT Frequency 2x / week   PT Duration 4 weeks   PT Treatment/Interventions ADLs/Self Care Home Management;DME Instruction;Patient/family education;Orthotic Fit/Training;Manual techniques;Manual lymph drainage;Passive range of motion   PT Next Visit Plan Patient plans to pursue getting a neck compression garment at [facility].  She wants to come back for one or two sessions after Christmas to make sure she has everything lined up for managing her lymphedema at home.   PT Home Exercise Plan neck AROM   Consulted and Agree with Plan of Care Patient      Patient will benefit from skilled therapeutic intervention in order to improve the following deficits and impairments:  Decreased range of motion, Decreased knowledge of use of DME, Decreased knowledge of precautions, Increased edema, Decreased mobility  Visit Diagnosis: Lymphedema, not elsewhere classified - Plan: PT plan of care cert/re-cert  Disorder of the skin and  subcutaneous tissue related to radiation, unspecified - Plan: PT plan of care cert/re-cert  Contracture of muscle, multiple sites - Plan: PT plan of care cert/re-cert     Problem List Patient Active Problem List   Diagnosis Date Noted  . Acute bronchitis 08/17/2015  . 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 Chronic obstructive pulmonary disease ([place]) 03/16/2015  . Moderate COPD (chronic obstructive pulmonary disease) ([place]) 03/16/2015  . COPD with chronic bronchitis ([place]) 12/13/2014  . Bibasilar crackles 12/13/2014  . Irritated throat 12/13/2014  . Chronic respiratory failure with hypoxia ([place]) 08/18/2014  . Cough 05/19/2014  . A-fib ([place]) 05/11/2014  . Orthostatic hypotension 01/18/2014  . HLD (hyperlipidemia)   . Hypertension   . Dizziness 11/30/2013  . Paroxysmal a-fib ([place]) 12/04/2012  . Palpitations 11/23/2011  . Hypothyroidism   . Varicose veins of lower extremities with other complications 87/86/7672  . IBS (irritable bowel syndrome)   . Bundle branch block, right   . Coronary artery disease   . Fatigue   . Bruises easily   . Joint pain   . Anxiety   . Chronic diastolic CHF (congestive heart failure) ([place]) 12/07/2009  . COPD (chronic obstructive pulmonary disease) ([place]) 12/07/2009  . GERD 06/27/2009  . PERSONAL HX COLONIC POLYPS 06/27/2009    [name],Renee 01/18/2016, 12:48 PM  [place], [place], [address] Phone: [phone]   Fax:  [phone]  Name: Renee [name] MRN: [id number] Date of Birth: [birth year]   Serafina Royals, PT 01/18/16 12:48 PM  PHYSICAL THERAPY DISCHARGE SUMMARY  Visits from Start of Care: 6  Current functional level related to goals / functional outcomes: See above   Remaining deficits: See above   Education / Equipment: See above  Plan: Patient agrees to discharge.  Patient goals were partially met. Patient is being discharged due to  not returning since the last visit.  ?????    Allyson Sabal [place], [place] 03/01/17 11:42 AM

## 2016-01-18 NOTE — Progress Notes (Signed)
Nutrition follow-up completed with patient status post radiation therapy for epiglottis cancer. Patient reports she no longer has an epiglottis so she is frightened to eat or drink. She does use her feeding tube and instills 4 cans Osmolite 1.5 daily. She is able to drink water and coffee without difficulty. Patient continues to try to eat soft foods. Weight has improved and was documented as 134.2 pounds, December 13 increased from 131.6 pounds. Patient states she cannot tolerate anymore tube feeding, than she is already doing.  Nutrition diagnosis: Inadequate oral intake continues.  Intervention:  Patient was educated to continue Osmolite 1.5 via PEG one can 4 times a day as tolerated. Encouraged her to continue to try soft foods as permitted by physician. Urged her to follow-up with speech pathology to evaluate swallowing safety. Teach back method used and questions were answered.  Monitoring, evaluation, goals: Patient will increase oral intake if safe as well as continue tube feedings to maintain current weight.  Next visit: Patient will call me with any questions or concerns.  **Disclaimer: This note was dictated with voice recognition software. Similar sounding words can inadvertently be transcribed and this note may contain transcription errors which may not have been corrected upon publication of note.**

## 2016-01-23 ENCOUNTER — Ambulatory Visit: Payer: Medicare Other

## 2016-01-23 DIAGNOSIS — R1310 Dysphagia, unspecified: Secondary | ICD-10-CM

## 2016-01-23 DIAGNOSIS — I890 Lymphedema, not elsewhere classified: Secondary | ICD-10-CM | POA: Diagnosis not present

## 2016-01-23 NOTE — Therapy (Signed)
[facility] [address] [place], [place], [address] Phone: [phone]   Fax:  [phone]  Speech Language Pathology Treatment  Patient Details  Name: Renee [name] MRN: [id number] Date of Birth: 1934-04-08 Referring Provider: Eppie Gibson MD  Encounter Date: 01/23/2016      End of Session - 01/23/16 1346    Visit Number 5   Number of Visits 6   Date for SLP Re-Evaluation 02/20/16   SLP Start Time [id number]   SLP Stop Time  1240   SLP Time Calculation (min) 42 min      Past Medical History:  Diagnosis Date  . Anxiety    takes Xanax as needed  . Arthritis   . Brain aneurysm   . Chronic back pain    buldging disc  . Chronic bronchitis ([place])   . Chronic diastolic CHF (congestive heart failure) (HCC)    was on Lasix but has been off for a few months  . Chronic respiratory failure ([place])   . Coronary artery disease    a. CABG 09/2009. b. normal myoview in 05/2012.   [name] Dysphagia   . Emphysema lung ([place])   . Emphysema/COPD ([place])    on home O2  . GERD (gastroesophageal reflux disease)    takes Nexium daily  . History of colon polyps    benign  . History of radiation therapy 07/19/15- 09/08/15   Supraglottis and bilateral neck.   . History of shingles   . History of vertigo    doesn't take any meds  . HLD (hyperlipidemia)    can't take meds d/t joint aches/pains  . Hypertension   . Hypothyroidism    takes Synthroid daily  . IBS (irritable bowel syndrome)   . Internal hemorrhoids   . Macular degeneration    dry  . Myocardial infarction 2009   takes Eliquis daily.  . Orthostatic hypotension   . Osteoporosis   . PAF (paroxysmal atrial fibrillation) ([place])    a. Post op after CABG. Intolerant of amio in the past. b. Recurrence - started on Tikosyn 11/2013.  [name] Pneumonia    several yrs ago  . PONV (postoperative nausea and vomiting)   . RBBB    takes Metoporolol daily  . Sciatic nerve pain     Past  Surgical History:  Procedure Laterality Date  . APPENDECTOMY  1995  . ATRIAL FIBRILLATION ABLATION N/A 05/11/2014   Procedure: ATRIAL FIBRILLATION ABLATION;  Surgeon: Thompson Grayer, MD;  Location[facility] CATH LAB;  Service: Cardiovascular;  Laterality: N/A;  . CARDIAC CATHETERIZATION  1990's; 06/2009  . CATARACT EXTRACTION W/ INTRAOCULAR LENS  IMPLANT, BILATERAL Bilateral   . CHOLECYSTECTOMY  2008  . colonosocpy    . CORONARY ARTERY BYPASS GRAFT  09/21/2009   CABG X2  . DIRECT LARYNGOSCOPY N/A 06/03/2015   Procedure: Direct laryngoscopy with biopsy left epiglottic lesion;  Surgeon: [name], MD;  Location: [facility];  Service: ENT;  Laterality: N/A;  . ESOPHAGOGASTRODUODENOSCOPY    . IR GENERIC HISTORICAL  11/15/2015   IR RADIOLOGIST EVAL & MGMT 11/15/2015 Saverio Danker, PA-C [facility]-INTERV RAD  . JOINT REPLACEMENT    . LARYNX SURGERY  ~ 1960   "tumor removed"  . RETINAL LASER PROCEDURE Left 10/19/2013   "had a wrinkle in it"  . TEE WITHOUT CARDIOVERSION N/A 05/10/2014   Procedure: TRANSESOPHAGEAL ECHOCARDIOGRAM (TEE);  Surgeon: Sueanne Margarita, MD;  Location: [facility] ENDOSCOPY;  Service: Cardiovascular;  Laterality: N/A;  . TONSILLECTOMY AND ADENOIDECTOMY    .  TOTAL ABDOMINAL HYSTERECTOMY  1986  . TOTAL HIP ARTHROPLASTY Right 11/01/2008  . TUBAL LIGATION  1973    There were no vitals filed for this visit.      Subjective Assessment - 01/23/16 1332    Subjective Pt gained 3 lbs at last weigh in on 01-18-16. Having 4 cans through PEG daily, and pt reported no POs since visit with Dr. [name] 01-04-16. "Renee [name], I'm scared."   Patient is accompained by: Family member  husband   Currently in Pain? Yes   Pain Score 4    Pain Location Throat   Pain Orientation Anterior;Posterior   Pain Descriptors / Indicators Constant   Aggravating Factors  swallowing   Pain Relieving Factors rest               ADULT SLP TREATMENT - 01/23/16 1156      General Information   Behavior/Cognition  Alert;Cooperative;Pleasant mood   HPI Since last visit, ENT on 01-12-16 with laryngoscope. Pt observed to have almost absent epiglottis, with structure almost like supraglottic laryngectomy. Dr. [name] told pt she will likely need to use PEG indefinitely, per EPIC.     Treatment Provided   Treatment provided Dysphagia     Dysphagia Treatment   Temperature Spikes Noted No   Oral Cavity - Dentition Dentures, top   Treatment Methods Skilled observation;Compensation strategy training;Patient/caregiver education   Patient observed directly with PO's Yes   Type of PO's observed Dysphagia 1 (puree);Thin liquids;Nectar-thick liquids  tomato soup, water, applesauce   Oral Phase Signs & Symptoms --  none noted   Pharyngeal Phase Signs & Symptoms Complaints of residue;Multiple swallows;Immediate cough   Other treatment/comments Pt with dysgeusia reported. No overt s/s aspiration PNA observed or reported to SLP today. Pt with immediate throat clear with sip H2O due to "going down the wrong way." SLP educated pt re: super-supraglottic technique and when pt performed this with liquids (nectar and thin), she did not sense material entering the airway and stated she completed cough due to SLP telling her to with the technique. SLP had the patient use the technique with nectar with the same positive result, but with pureed, stasis was reported in pharynx. This reportedly mostly cleared with a liquid wash using super-supraglottic technique. Pt was independent with super-supraglottic at end of today's session as SLP had pt practice 12 times prior to doing the technique herself x2 independently. SLP told pt he was going to rec modified (MBSS) for pt to objectively assess oropharyngeal swallow and pt agreed this would be in her best interest as well, to put her at ease what she could do to stay safe and minimize risk of aspiration during POs.      Assessment / Recommendations / Plan   Plan Continue with current plan of  care;Consult other service (comment)  Modified barium swallow     Dysphagia Recommendations   Diet recommendations Thin liquid;Nectar-thick liquid  as tolerated   Postural Changes and/or Swallow Maneuvers Hold breath during swallow, followed by a cough (Super supraglottic swallow)     Progression Toward Goals   Progression toward goals Progressing toward goals          SLP Education - 01/23/16 1346    Education provided Yes   Education Details super-supraglottic swallow   Person(s) Educated Patient;Spouse   Methods Demonstration;Explanation;Verbal cues   Comprehension Verbalized understanding;Returned demonstration;Verbal cues required          SLP Short Term Goals - 12/12/15 1105  SLP SHORT TERM GOAL #1   Title pt will demo HEP with rare min A   Status Achieved     SLP SHORT TERM GOAL #2   Title pt will tell SLP why she is completing HEP with modified independence over two therapy sessions   Status Achieved          SLP Long Term Goals - 01/23/16 1353      SLP LONG TERM GOAL #1   Title pt will demo HEP with modified independnce over two sessions   Baseline one session 12-12-15   Time 1   Period --  therapy sessions, for all LTGs (i.e., 5 sessions including eval)   Status On-going     SLP LONG TERM GOAL #2   Title pt will tell SLP how a food journal can assist her in returning to full and least restrictive PO diet   Time 1   Period --  visits   Status On-going     SLP LONG TERM GOAL #3   Title pt will tell SLP 3 s/s aspiration PNA with modified independence   Time 1   Period --  visits   Status On-going          Plan - 01/23/16 1347    Clinical Impression Statement Report of almost absent epiglottis in Drs. [name] and [name] notes is noted. Pt without overt s/s aspiration PNA reported/observed. Immediate cough after sips H2O (due to pt reported airway compromise) were eliminated with super-supraglottic ("I only coughed because you told me to.").  SLP recommeds modified barium swallow exam to objectively assess oropharyngeal swallow function. SLP encouraged pt to try nectar and thin liquids with the super-supraglottic, at least until modified barium swallow (if agreed by Dr. [name]). Pt requires cont'd skilled ST to assess safety with POs, and her ability with HEP.   Speech Therapy Frequency --  approx every four weeks   Duration --  one more visit until 02-20-16   Treatment/Interventions Aspiration precaution training;SLP instruction and feedback;Compensatory strategies;Pharyngeal strengthening exercises;Oral motor exercises;Trials of upgraded texture/liquids;Cueing hierarchy;Patient/family education   Potential to Achieve Goals Good   Potential Considerations Severity of impairments      Patient will benefit from skilled therapeutic intervention in order to improve the following deficits and impairments:   Dysphagia, unspecified type    Problem List Patient Active Problem List   Diagnosis Date Noted  . Acute bronchitis 08/17/2015  . 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 Chronic obstructive pulmonary disease ([place]) 03/16/2015  . Moderate COPD (chronic obstructive pulmonary disease) ([place]) 03/16/2015  . COPD with chronic bronchitis ([place]) 12/13/2014  . Bibasilar crackles 12/13/2014  . Irritated throat 12/13/2014  . Chronic respiratory failure with hypoxia ([place]) 08/18/2014  . Cough 05/19/2014  . A-fib ([place]) 05/11/2014  . Orthostatic hypotension 01/18/2014  . HLD (hyperlipidemia)   . Hypertension   . Dizziness 11/30/2013  . Paroxysmal a-fib ([place]) 12/04/2012  . Palpitations 11/23/2011  . Hypothyroidism   . 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 IBS (irritable bowel syndrome)   . Bundle branch block, right   . Coronary artery disease   . Fatigue   . Bruises easily   . Joint pain   . Anxiety   . Chronic diastolic CHF (congestive heart failure) ([place]) 12/07/2009  . COPD (chronic  obstructive pulmonary disease) ([place]) 12/07/2009  . GERD 06/27/2009  . PERSONAL HX COLONIC POLYPS 06/27/2009    SCHINKE,CARL ,MS, CCC-SLP  01/23/2016, 1:54 PM  [facility] [address] [place], [place], [address] Phone: [phone]   Fax:  [phone]   Name: Renee [name] MRN: [id number] Date of Birth: October 19, 1934

## 2016-01-23 NOTE — Patient Instructions (Signed)
Use the special swallow we used today in the session from now until you have your swallowing test. I think Dr. [name] will OK this test.

## 2016-01-24 ENCOUNTER — Ambulatory Visit (INDEPENDENT_AMBULATORY_CARE_PROVIDER_SITE_OTHER): Payer: Medicare Other | Admitting: Adult Health

## 2016-01-24 ENCOUNTER — Encounter: Payer: Self-pay | Admitting: Adult Health

## 2016-01-24 DIAGNOSIS — J9611 Chronic respiratory failure with hypoxia: Secondary | ICD-10-CM

## 2016-01-24 DIAGNOSIS — J449 Chronic obstructive pulmonary disease, unspecified: Secondary | ICD-10-CM

## 2016-01-24 DIAGNOSIS — C321 Malignant neoplasm of supraglottis: Secondary | ICD-10-CM

## 2016-01-24 NOTE — Assessment & Plan Note (Signed)
Continue follow with oncology ENT and radiation oncology PET scan did show abnormal area in the right upper lobe that was mildly hypermetabolic felt to be possibly radiation changes versus inflammatory component. We'll continue to monitor as she is asymptomatic

## 2016-01-24 NOTE — Progress Notes (Signed)
@Patient  ID: Renee [name], female    DOB: 06/17/1934, 81 y.o.   MRN: [id number]  Chief Complaint  Patient presents with  . 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 She has known COPD. She says that her breathing has been stable without flare of cough or wheezing. She would like to get rid of her daytime oxygen. She has not been using for the last few months. Walk test in the office does not show any desaturations on room air.   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 She has finished radiation therapy. She is followed by oncology , radiation oncology and ENT. She is PEG dependent .  She had a PET scan for restaging last month that showed residual but improved hypermetabolism associated with the right epiglottis, extending into fairly to the vocal cords. No hypermetabolic adenopathy. There was a new irregular area in the apical segment of the right upper lobe that was mildly hypermetabolic. She is asymptomatic with no cough, congestion, hemoptysis. This was felt by radiation oncology to represent possibly inflammatory or radiation changes.    Allergies  Allergen Reactions  . Levofloxacin Nausea Only    Unable to tolerate more than 7 days  . Penicillins Swelling and Rash    Swelling location undefined. Has patient had a PCN reaction causing immediate rash, facial/tongue/throat swelling, SOB or lightheadedness with hypotension: Yes Has patient had a PCN reaction causing severe rash involving mucus membranes or skin necrosis: No Has patient had a PCN reaction that required hospitalization Unk Has patient had a PCN reaction occurring within the last 10 years: Unk If all of the above answers are "NO", then  may proceed with Cephalosporin use.   [name] Spiriva [Tiotropium Bromide Monohydrate] Other (See Comments)    Throat irritation  . Statins Other (See Comments)    Myalgias  . Budesonide-Formoterol Fumarate Other (See Comments)    Tachycardia  . Morphine Other (See Comments)    double vision  . Phenazopyridine Hcl Other (See Comments)    *piridium* face red  . Pneumococcal Vaccines Swelling    [place] of the skin  . Prednisone Swelling    Can't take PO but injectable is fine  . Sulfa Antibiotics Rash  . Sulfonamide Derivatives Rash    Immunization History  Administered Date(s) Administered  . Influenza Split 10/08/2011  . Influenza Whole 10/06/2009, 12/07/2010  . Influenza,inj,Quad PF,36+ Mos 11/13/2012, 12/07/2014, 10/31/2015  . Influenza-Unspecified 11/13/2013  . Pneumococcal Polysaccharide-23 02/06/2007    Past Medical History:  Diagnosis Date  . Anxiety    takes Xanax as needed  . Arthritis   . Brain aneurysm   . Chronic back pain    buldging disc  . Chronic bronchitis ([place])   . Chronic diastolic CHF (congestive heart failure) (HCC)    was on Lasix but has been off for a few months  . Chronic respiratory failure ([place])   . Coronary artery disease    a. CABG 09/2009. b. normal myoview in 05/2012.   [name] Dysphagia   . Emphysema lung ([place])   . Emphysema/COPD ([place])    on home O2  . GERD (gastroesophageal reflux disease)    takes Nexium daily  . History of  colon polyps    benign  . History of radiation therapy 07/19/15- 09/08/15   Supraglottis and bilateral neck.   . History of shingles   . History of vertigo    doesn't take any meds  . HLD (hyperlipidemia)    can't take meds d/t joint aches/pains  . Hypertension   . Hypothyroidism    takes Synthroid daily  . IBS (irritable bowel syndrome)   . Internal hemorrhoids   . Macular degeneration    dry  . Myocardial infarction 2009   takes Eliquis daily.  . Orthostatic hypotension   . Osteoporosis   . PAF (paroxysmal  atrial fibrillation) ([place])    a. Post op after CABG. Intolerant of amio in the past. b. Recurrence - started on Tikosyn 11/2013.  [name] Pneumonia    several yrs ago  . PONV (postoperative nausea and vomiting)   . RBBB    takes Metoporolol daily  . Sciatic nerve pain     Tobacco History: History  Smoking Status  . Former Smoker  . Types: Cigarettes  Smokeless Tobacco  . Never Used    Comment: quit smoking 7 yrs ago   Counseling given: Not Answered   Outpatient Encounter Prescriptions as of 01/24/2016  Medication Sig  . ALPRAZolam (XANAX) 0.25 MG tablet Take 0.125 mg by mouth 2 (two) times daily as needed for anxiety or sleep.   [name] apixaban (ELIQUIS) 5 MG TABS tablet Take 1 tablet (5 mg total) by mouth 2 (two) times daily.  . clindamycin (CLEOCIN) 300 MG capsule Take 300 mg by mouth 3 (three) times daily.  [name] HYDROcodone-acetaminophen (HYCET) 7.5-325 mg/15 ml solution Place 15 mLs into feeding tube every 4 (four) hours as needed for moderate pain (May take 51mL QHS and in the middle of the night.).  [name] lansoprazole (PREVACID SOLUTAB) 30 MG disintegrating tablet Take 30 mg by mouth daily.  [name] levothyroxine (SYNTHROID, LEVOTHROID) 25 MCG tablet Take 50 mcg by mouth daily before breakfast.   . metoprolol succinate (TOPROL-XL) 25 MG 24 hr tablet Take 1 tablet (25 mg total) by mouth daily.  . nitroGLYCERIN (NITROSTAT) 0.4 MG SL tablet Place 0.4 mg under the tongue every 5 (five) minutes x 3 doses as needed for chest pain.   . Nutritional Supplements (FEEDING SUPPLEMENT, OSMOLITE 1.5 CAL,) LIQD Give 1 can of Osmolite 1.5 or equivalent - 5 times daily with 60 cc free water before and after bolus feeding via PEG.  Give additional 240 cc water BID. Send formula and supplies.  [name] ondansetron (ZOFRAN) 4 MG tablet Take 1 tablet (4 mg total) by mouth every 8 (eight) hours as needed for nausea or vomiting.  [name] Glycol-Propyl Glycol (SYSTANE OP) Place 1 drop into both eyes 2 (two) times daily.   .  polyethylene glycol (MIRALAX / GLYCOLAX) packet Take 17 g by mouth daily as needed.  . sodium fluoride (FLUORISHIELD) 1.1 % GEL dental gel Instill one drop of gel per tooth space of fluoride tray. Place over teeth for 5 minutes. Remove. Spit out excess. Repeat nightly.  [name] acetaminophen (TYLENOL) 500 MG tablet Take 500 mg by mouth every 6 (six) hours as needed for moderate pain or headache.   . albuterol (PROAIR HFA) 108 (90 Base) MCG/ACT inhaler Inhale 2 puffs into the lungs every 6 (six) hours as needed for wheezing or shortness of breath.   . cholecalciferol (VITAMIN D) 1000 UNITS tablet Take 1,000 Units by mouth daily.  [name] lidocaine (XYLOCAINE) 2 % solution Mix 1  part 2%viscous lidocaine,1part H2O.Swish and/or swallow 67mL of this mixture,72min before meals and at bedtime, up to QID (Patient not taking: Reported on 01/24/2016)  . OXYGEN Inhale 2 L into the lungs as directed. As needed for shortness of breath  . senna (SENOKOT) 8.6 MG tablet Take 1 tablet by mouth daily as needed for constipation.   . [DISCONTINUED] esomeprazole (NEXIUM) 40 MG capsule Take 40 mg by mouth daily before breakfast.     No facility-administered encounter medications on file as of 01/24/2016.      Review of Systems  Constitutional:   No  weight loss, night sweats,  Fevers, chills, fatigue, or  lassitude.  HEENT:   No headaches,  Difficulty swallowing,  Tooth/dental problems, or  Sore throat,                No sneezing, itching, ear ache, nasal congestion, post nasal drip,   CV:  No chest pain,  Orthopnea, PND, swelling in lower extremities, anasarca, dizziness, palpitations, syncope.   GI  No heartburn, indigestion, abdominal pain, nausea, vomiting, diarrhea, change in bowel habits, loss of appetite, bloody stools.   Resp: No shortness of breath with exertion or at rest.  No excess mucus, no productive cough,  No non-productive cough,  No coughing up of blood.  No change in color of mucus.  No wheezing.  No chest  wall deformity  Skin: no rash or lesions.  GU: no dysuria, change in color of urine, no urgency or frequency.  No flank pain, no hematuria   MS:  No joint pain or swelling.  No decreased range of motion.  No back pain.    Physical Exam  BP 110/60   Pulse 73   Temp 97.8 F (36.6 C) (Oral)   Ht 5\' 6"  (1.676 m)   Wt 133 lb (60.3 kg)   SpO2 94%   BMI 21.47 kg/m   GEN: A/Ox3; pleasant , NAD, thin female with mild hoarseness    HEENT:  [place]/AT,  EACs-clear, TMs-wnl, NOSE-clear, THROAT-clear, no lesions, no postnasal drip or exudate noted.   NECK:  Supple w/ fair ROM; no JVD; normal carotid impulses w/o bruits; no thyromegaly or nodules palpated; no lymphadenopathy.    RESP  Decreased BS in bases ,  no accessory muscle use, no dullness to percussion  CARD:  RRR, no m/r/g, no peripheral edema, pulses intact, no cyanosis or clubbing.  GI:   Soft & nt; nml bowel sounds; no organomegaly or masses detected.   Musco: Warm bil, no deformities or joint swelling noted.   Neuro: alert, no focal deficits noted.    Skin: Warm, no lesions or rashes  Psych:  No change in mood or affect. No depression or anxiety.  No memory loss.  Lab Results:  CBC    Component Value Date/Time   WBC 6.0 08/22/2015 1340   WBC 3.6 (L) 08/16/2015 0433   RBC 4.57 08/22/2015 1340   RBC 3.92 08/16/2015 0433   HGB 12.3 08/22/2015 1340   HCT 39.0 08/22/2015 1340   PLT 406 (H) 08/22/2015 1340   MCV 85.3 08/22/2015 1340   MCH 27.0 08/22/2015 1340   MCH 26.5 08/16/2015 0433   MCHC 31.6 08/22/2015 1340   MCHC 30.1 08/16/2015 0433   RDW 18.5 (H) 08/22/2015 1340   LYMPHSABS 0.5 (L) 08/22/2015 1340   MONOABS 0.6 08/22/2015 1340   EOSABS 0.1 08/22/2015 1340   BASOSABS 0.1 08/22/2015 1340    BMET    Component Value Date/Time  NA 138 09/19/2015 1428   K 4.1 09/19/2015 1428   CL 106 08/16/2015 0433   CO2 28 09/19/2015 1428   GLUCOSE 106 09/19/2015 1428   BUN 19.0 09/19/2015 1428   CREATININE 0.8  09/19/2015 1428   CALCIUM 10.2 09/19/2015 1428   GFRNONAA >60 08/16/2015 0433   GFRAA >60 08/16/2015 0433    BNP No results found for: BNP  ProBNP    Component Value Date/Time   PROBNP 250.9 11/30/2013 1222    Imaging: Nm Pet Image Restag (ps) Skull Base To Thigh  Result Date: 01/02/2016 CLINICAL DATA:  Subsequent treatment strategy for epiglottic cancer. EXAM: NUCLEAR MEDICINE PET SKULL BASE TO THIGH TECHNIQUE: 6.6 mCi F-18 FDG was injected intravenously. Full-ring PET imaging was performed from the skull base to thigh after the radiotracer. CT data was obtained and used for attenuation correction and anatomic localization. FASTING BLOOD GLUCOSE:  Value: 100 mg/dl COMPARISON:  06/30/2015 and CT mac 05/20/2015. FINDINGS: NECK Residual hypermetabolism is seen along the right epiglottis, extending inferiorly to the vocal cords, asymmetric on the right with an SUV max of 12.1. No definite CT correlate. No hypermetabolic lymph nodes. There may be a left mastoid effusion. CHEST New irregular airspace consolidation is seen in the apical segment right upper lobe (CT image 10 of series 7) with mild associated hypermetabolism. No hypermetabolic mediastinal, hilar or axillary lymph nodes. Centrilobular emphysema. Atherosclerotic calcification of the arterial vasculature. No pericardial or pleural effusion. ABDOMEN/PELVIS No abnormal hypermetabolism in the liver, adrenal glands, spleen or pancreas. No hypermetabolic lymph nodes. Liver, adrenal glands, kidneys, spleen and pancreas are grossly unremarkable. Cholecystectomy. Percutaneous gastrostomy is in place. Atherosclerotic calcification of the arterial vasculature without abdominal aortic aneurysm. SKELETON No abnormal osseous hypermetabolism.  Right hip arthroplasty. IMPRESSION: 1. Residual, but improved, hypermetabolism associated with the right epiglottis, extending inferiorly to the vocal cords. No hypermetabolic adenopathy. 2. New irregular airspace  consolidation in the apical segment right upper lobe, mildly hypermetabolic, likely infectious or inflammatory in etiology. Attention on followup exams is warranted. 3.  Aortic atherosclerosis (ICD10-170.0). Electronically Signed   By: Lorin Picket M.D.   On: 01/02/2016 10:50     Assessment & Plan:   COPD (chronic obstructive pulmonary disease) (HCC) Stable without flare   Cont on current regimen   Chronic respiratory failure with hypoxia No ambulatory desaturations May discontinue oxygen during daytime. Continue on oxygen at bedtime at 2 L  Carcinoma of epiglottis [facility]) Continue follow with oncology ENT and radiation oncology PET scan did show abnormal area in the right upper lobe that was mildly hypermetabolic felt to be possibly radiation changes versus inflammatory component. We'll continue to monitor as she is asymptomatic     Rexene Edison, NP 01/24/2016

## 2016-01-24 NOTE — Assessment & Plan Note (Signed)
No ambulatory desaturations May discontinue oxygen during daytime. Continue on oxygen at bedtime at 2 L

## 2016-01-24 NOTE — Assessment & Plan Note (Signed)
Stable without flare   Cont on current regimen

## 2016-01-24 NOTE — Patient Instructions (Addendum)
Continue on oxygen At bedtime   Will discuss further testing if needed on return .  Follow up with Dr. Chase Caller in 6-8 weeks .

## 2016-02-02 ENCOUNTER — Other Ambulatory Visit: Payer: Self-pay | Admitting: Radiation Oncology

## 2016-02-02 DIAGNOSIS — C321 Malignant neoplasm of supraglottis: Secondary | ICD-10-CM

## 2016-02-03 ENCOUNTER — Other Ambulatory Visit (HOSPITAL_COMMUNITY): Payer: Self-pay | Admitting: Radiation Oncology

## 2016-02-03 DIAGNOSIS — R1319 Other dysphagia: Secondary | ICD-10-CM

## 2016-02-10 ENCOUNTER — Other Ambulatory Visit: Payer: Self-pay | Admitting: Nurse Practitioner

## 2016-02-10 ENCOUNTER — Telehealth: Payer: Self-pay | Admitting: *Deleted

## 2016-02-10 NOTE — Telephone Encounter (Signed)
CALLED CARL SCHINKE'S OFFICE AND GAVE MESSAAGE TO ANGIE REGARDING THIS PATIENT'S DIET, FORWARDED IN-BASKET MESSAGE TO CARL SCHINKE, REGARDING PATIENT'S DIET.

## 2016-02-13 ENCOUNTER — Other Ambulatory Visit: Payer: Self-pay | Admitting: Radiation Oncology

## 2016-02-13 ENCOUNTER — Telehealth: Payer: Self-pay | Admitting: *Deleted

## 2016-02-13 ENCOUNTER — Encounter: Payer: Self-pay | Admitting: *Deleted

## 2016-02-13 DIAGNOSIS — C321 Malignant neoplasm of supraglottis: Secondary | ICD-10-CM

## 2016-02-13 MED ORDER — HYDROCODONE-ACETAMINOPHEN 7.5-325 MG/15ML PO SOLN: 10.0000 mL | Freq: Four times a day (QID) | ORAL | 0 refills | Status: DC | PRN

## 2016-02-13 MED FILL — HYDROCOD-APAP 7.5-325/15ML: 7.5-325 | 18 days supply | Qty: 750 | Fill #0

## 2016-02-13 NOTE — Telephone Encounter (Signed)
Oncology Nurse Navigator Documentation  Received call from Renee [name] indicating she needs refill for Hycet.. She is currently taking 10 mL QID for throat pain rated 7-8; she has none remaining of current Rx. She further noted she has 0920 appt with ENT Dr. [name] this Wednesday prior to [facility] scheduled for 1300. She understands I will call her when Rx is available for pick-up. Dr. [name] notified.  [name], RN, BSN, [place] [facility]ck Oncology Nurse [place] at [place] [phone]

## 2016-02-13 NOTE — Telephone Encounter (Signed)
Oncology Nurse Navigator Documentation  Spoke with Renee [name], notified her Rx for Hycet available for her husband to pick up in Radiation Nursing.  She voiced understanding.  [name], RN, BSN, [place] Neck Oncology Nurse [place] at [place] [phone]

## 2016-02-13 NOTE — Progress Notes (Signed)
Oncology Nurse Navigator Documentation  Mr. Lowenstein arrived to pick-up Hycet Rx for Mrs. Megan Salon.  He completed Rx Pick-up log, I provided Rx.  [name], RN, BSN, [place] Neck Oncology Nurse [place] at [place] [phone]

## 2016-02-15 ENCOUNTER — Ambulatory Visit (HOSPITAL_COMMUNITY): Payer: Medicare Other

## 2016-02-15 ENCOUNTER — Other Ambulatory Visit (HOSPITAL_COMMUNITY): Payer: Self-pay

## 2016-02-15 ENCOUNTER — Telehealth: Payer: Self-pay | Admitting: *Deleted

## 2016-02-15 DIAGNOSIS — J40 Bronchitis, not specified as acute or chronic: Secondary | ICD-10-CM | POA: Diagnosis not present

## 2016-02-15 DIAGNOSIS — C329 Malignant neoplasm of larynx, unspecified: Secondary | ICD-10-CM | POA: Diagnosis not present

## 2016-02-15 NOTE — Telephone Encounter (Signed)
Oncology Nurse Navigator Documentation  Received call from patient's husband indicating need to cancel MBS appt b/c she is sick.  He understands I will cancel and arrange for rescheduling.  [name], RN, BSN, [place] Neck Oncology Nurse [place] at [place] [phone]

## 2016-02-24 ENCOUNTER — Ambulatory Visit (HOSPITAL_COMMUNITY)
Admission: RE | Admit: 2016-02-24 | Discharge: 2016-02-24 | Disposition: A | Discharge status: Home / Self Care | Payer: Medicare Other | Source: Ambulatory Visit | Attending: Radiation Oncology | Admitting: Radiation Oncology

## 2016-02-24 DIAGNOSIS — R1319 Other dysphagia: Secondary | ICD-10-CM

## 2016-02-24 DIAGNOSIS — R938 Abnormal findings on diagnostic imaging of other specified body structures: Secondary | ICD-10-CM | POA: Insufficient documentation

## 2016-02-24 DIAGNOSIS — C321 Malignant neoplasm of supraglottis: Secondary | ICD-10-CM | POA: Diagnosis not present

## 2016-02-24 DIAGNOSIS — R05 Cough: Secondary | ICD-10-CM | POA: Diagnosis not present

## 2016-02-27 ENCOUNTER — Ambulatory Visit: Payer: Medicare Other

## 2016-02-27 ENCOUNTER — Ambulatory Visit: Payer: Self-pay

## 2016-02-28 ENCOUNTER — Ambulatory Visit (INDEPENDENT_AMBULATORY_CARE_PROVIDER_SITE_OTHER): Payer: Medicare Other | Admitting: Internal Medicine

## 2016-02-28 DIAGNOSIS — I4510 Unspecified right bundle-branch block: Secondary | ICD-10-CM | POA: Diagnosis not present

## 2016-02-28 DIAGNOSIS — I480 Paroxysmal atrial fibrillation: Secondary | ICD-10-CM

## 2016-02-28 NOTE — Progress Notes (Signed)
Patient Care Team: [name], MD as PCP - General (Internal Medicine) Elsie Stain, MD (Pulmonary Disease) Peter M Martinique, MD (Cardiology) Leota Sauers, RN as Oncology Nurse Navigator Eppie Gibson, MD as Attending Physician (Radiation Oncology)   HPI  Renee [name] is a 82 y.o. female  seen in follow-up for orthostasis occurring in the context of radiation therapy. She also has a history  of  atrial fibrillation that dates back a number of years possibly to around the time of her bypass surgery in 2011.    She ultimately underwent catheter ablation 4/16 and when last seen by Dr. Greggory Brandy 11/16 was doing well.   Post ablation complaints of palpitations 5/16 were evaluated with a Holter monitor demonstrates PACs and PVCs.  Subsequently, been associated with changes in position they were identified as being associated with orthostasis  ;  This has been better with the discontinuation of her diuretics. Her oncologist and recommended salt supplementation and we had recommended an abdominal binder.   She is markedly limited 2/2    COPD and balance issues     Echocardiogram 5/16 demonstrated near-normal LV function. Myoview 212/15 had been normal   Chronic dyspnea has been ascribed pulmonary disease.       Past Medical History:  Diagnosis Date  . Anxiety    takes Xanax as needed  . Arthritis   . Brain aneurysm   . Chronic back pain    buldging disc  . Chronic bronchitis ([place])   . Chronic diastolic CHF (congestive heart failure) (HCC)    was on Lasix but has been off for a few months  . Chronic respiratory failure ([place])   . Coronary artery disease    a. CABG 09/2009. b. normal myoview in 05/2012.   [name] Dysphagia   . Emphysema lung ([place])   . Emphysema/COPD ([place])    on home O2  . GERD (gastroesophageal reflux disease)    takes Nexium daily  . History of colon polyps    benign  . History of radiation therapy 07/19/15- 09/08/15   Supraglottis and bilateral neck.   .  History of shingles   . History of vertigo    doesn't take any meds  . HLD (hyperlipidemia)    can't take meds d/t joint aches/pains  . Hypertension   . Hypothyroidism    takes Synthroid daily  . IBS (irritable bowel syndrome)   . Internal hemorrhoids   . Macular degeneration    dry  . Myocardial infarction 2009   takes Eliquis daily.  . Orthostatic hypotension   . Osteoporosis   . PAF (paroxysmal atrial fibrillation) ([place])    a. Post op after CABG. Intolerant of amio in the past. b. Recurrence - started on Tikosyn 11/2013.  [name] Pneumonia    several yrs ago  . PONV (postoperative nausea and vomiting)   . RBBB    takes Metoporolol daily  . Sciatic nerve pain     Past Surgical History:  Procedure Laterality Date  . APPENDECTOMY  1995  . ATRIAL FIBRILLATION ABLATION N/A 05/11/2014   Procedure: ATRIAL FIBRILLATION ABLATION;  Surgeon: Thompson Grayer, MD;  Location: [facility] CATH LAB;  Service: Cardiovascular;  Laterality: N/A;  . CARDIAC CATHETERIZATION  1990's; 06/2009  . CATARACT EXTRACTION W/ INTRAOCULAR LENS  IMPLANT, BILATERAL Bilateral   . CHOLECYSTECTOMY  2008  . colonosocpy    . CORONARY ARTERY BYPASS GRAFT  09/21/2009   CABG X2  . DIRECT LARYNGOSCOPY N/A 06/03/2015  Procedure: Direct laryngoscopy with biopsy left epiglottic lesion;  Surgeon: [name], MD;  Location: [place];  Service: ENT;  Laterality: N/A;  . ESOPHAGOGASTRODUODENOSCOPY    . IR GENERIC HISTORICAL  11/15/2015   IR RADIOLOGIST EVAL & MGMT 11/15/2015 Saverio Danker, PA-C [facility] RAD  . JOINT REPLACEMENT    . LARYNX SURGERY  ~ 1960   "tumor removed"  . RETINAL LASER PROCEDURE Left 10/19/2013   "had a wrinkle in it"  . TEE WITHOUT CARDIOVERSION N/A 05/10/2014   Procedure: TRANSESOPHAGEAL ECHOCARDIOGRAM (TEE);  Surgeon: Sueanne Margarita, MD;  Location: [facility] ENDOSCOPY;  Service: Cardiovascular;  Laterality: N/A;  . TONSILLECTOMY AND ADENOIDECTOMY    . TOTAL ABDOMINAL HYSTERECTOMY  1986  . TOTAL HIP ARTHROPLASTY Right  11/01/2008  . TUBAL LIGATION  1973    Current Outpatient Prescriptions  Medication Sig Dispense Refill  . acetaminophen (TYLENOL) 500 MG tablet Take 500 mg by mouth every 6 (six) hours as needed for moderate pain or headache.     . albuterol (PROAIR HFA) 108 (90 Base) MCG/ACT inhaler Inhale 2 puffs into the lungs every 6 (six) hours as needed for wheezing or shortness of breath.     . ALPRAZolam (XANAX) 0.25 MG tablet Take 0.125 mg by mouth 2 (two) times daily as needed for anxiety or sleep.     [name] apixaban (ELIQUIS) 5 MG TABS tablet Take 1 tablet (5 mg total) by mouth 2 (two) times daily. 60 tablet 1  . cholecalciferol (VITAMIN D) 1000 UNITS tablet Take 1,000 Units by mouth daily.    [name] HYDROcodone-acetaminophen (HYCET) 7.5-325 mg/15 ml solution Place 10 mLs into feeding tube 4 (four) times daily as needed for moderate pain. 750 mL 0  . lansoprazole (PREVACID SOLUTAB) 30 MG disintegrating tablet Take 30 mg by mouth daily.    [name] levothyroxine (SYNTHROID, LEVOTHROID) 25 MCG tablet Take 50 mcg by mouth daily before breakfast.     . metoprolol succinate (TOPROL-XL) 25 MG 24 hr tablet Take 1 tablet (25 mg total) by mouth daily.    . nitroGLYCERIN (NITROSTAT) 0.4 MG SL tablet Place 0.4 mg under the tongue every 5 (five) minutes x 3 doses as needed for chest pain.     . Nutritional Supplements (FEEDING SUPPLEMENT, OSMOLITE 1.5 CAL,) LIQD Give 1 can of Osmolite 1.5 or equivalent - 5 times daily with 60 cc free water before and after bolus feeding via PEG.  Give additional 240 cc water BID. Send formula and supplies. 1185 mL 0  . ondansetron (ZOFRAN) 4 MG tablet Take 1 tablet (4 mg total) by mouth every 8 (eight) hours as needed for nausea or vomiting. 50 tablet 3  . OXYGEN Inhale 2 L into the lungs as directed. As needed for shortness of breath    . Polyethyl Glycol-Propyl Glycol (SYSTANE OP) Place 1 drop into both eyes 2 (two) times daily.     [name] senna (SENOKOT) 8.6 MG tablet Take 1 tablet by mouth daily as  needed for constipation.     . sodium fluoride (FLUORISHIELD) 1.1 % GEL dental gel Instill one drop of gel per tooth space of fluoride tray. Place over teeth for 5 minutes. Remove. Spit out excess. Repeat nightly. 120 mL prn   No current facility-administered medications for this visit.     Allergies  Allergen Reactions  . Levofloxacin Nausea Only    Unable to tolerate more than 7 days  . Penicillins Swelling and Rash    Swelling location undefined. Has patient had a  PCN reaction causing immediate rash, facial/tongue/throat swelling, SOB or lightheadedness with hypotension: Yes Has patient had a PCN reaction causing severe rash involving mucus membranes or skin necrosis: No Has patient had a PCN reaction that required hospitalization Unk Has patient had a PCN reaction occurring within the last 10 years: Unk If all of the above answers are "NO", then may proceed with Cephalosporin use.   [name] Spiriva [Tiotropium Bromide Monohydrate] Other (See Comments)    Throat irritation  . Statins Other (See Comments)    Myalgias  . Budesonide-Formoterol Fumarate Other (See Comments)    Tachycardia  . Morphine Other (See Comments)    double vision  . Phenazopyridine Hcl Other (See Comments)    *piridium* face red  . Pneumococcal Vaccines Swelling    [place] of the skin  . Prednisone Swelling    Can't take PO but injectable is fine  . Sulfa Antibiotics Rash  . Sulfonamide Derivatives Rash    Review of Systems negative except from HPI and PMH  Physical Exam BP 132/72   Pulse 90   Ht 5\' 6"  (1.676 m)   Wt 132 lb (59.9 kg)   SpO2 97%   BMI 21.31 kg/m  Well developed and well nourished in no acute distress HENT normal E scleral and icterus clear Neck Supple JVP flat; carotids brisk and full Clear to ausculation Regular rate and rhythm S2 widely split, no murmurs gallops or rub Soft with active bowel sounds No clubbing cyanosis no  Edema Alert and oriented, grossly normal motor and  sensory function Skin Warm and Dry   ECG demonstrates sinus rhythm at  81 Intervals 14/12/42 Axis 50 Right bundle branch block- complete PVC  Assessment and  Plan  Atrial fibrillation-paroxysmal s/p RFCA 4/16  Right bundle branch block- complete  Coronary disease with prior bypass  Orthostatic hypotension    COPD  On Anticoagulation;  No bleeding issues   Orhtostasis better  She is getting confusing reports as to whether she should be eating after the epiglottectomy   I suggested she talk to Dr [name] on Friday  Without symptoms of ischemia          We spent more than 50% of our >25 min visit in face to face counseling regarding the above

## 2016-02-28 NOTE — Patient Instructions (Signed)
Medication Instructions: - Your physician recommends that you continue on your current medications as directed. Please refer to the Current Medication list given to you today.  Labwork: - Your physician recommends that you have lab work today: BMP/CBC  Procedures/Testing: - none ordered  Follow-Up: - Your physician wants you to follow-up in: 6 months with Chanetta Marshall, NP for Dr. Caryl Comes. You will receive a reminder letter in the mail two months in advance. If you don't receive a letter, please call our office to schedule the follow-up appointment.  Any Additional Special Instructions Will Be Listed Below (If Applicable).     If you need a refill on your cardiac medications before your next appointment, please call your pharmacy.

## 2016-02-29 LAB — CBC WITH DIFFERENTIAL/PLATELET
BASOS: 1 %
Basophils Absolute: 0 10*3/uL (ref 0.0–0.2)
EOS (ABSOLUTE): 0.1 10*3/uL (ref 0.0–0.4)
EOS: 2 %
HEMATOCRIT: 40.9 % (ref 34.0–46.6)
Hemoglobin: 13.3 g/dL (ref 11.1–15.9)
Immature Grans (Abs): 0 10*3/uL (ref 0.0–0.1)
Immature Granulocytes: 0 %
LYMPHS ABS: 0.7 10*3/uL (ref 0.7–3.1)
Lymphs: 13 %
MCH: 30.2 pg (ref 26.6–33.0)
MCHC: 32.5 g/dL (ref 31.5–35.7)
MCV: 93 fL (ref 79–97)
MONOCYTES: 13 %
MONOS ABS: 0.7 10*3/uL (ref 0.1–0.9)
NEUTROS ABS: 4.2 10*3/uL (ref 1.4–7.0)
Neutrophils: 71 %
Platelets: 329 10*3/uL (ref 150–379)
RBC: 4.4 x10E6/uL (ref 3.77–5.28)
RDW: 14.2 % (ref 12.3–15.4)
WBC: 5.8 10*3/uL (ref 3.4–10.8)

## 2016-02-29 LAB — BASIC METABOLIC PANEL
BUN / CREAT RATIO: 36 — AB (ref 12–28)
BUN: 22 mg/dL (ref 8–27)
CO2: 28 mmol/L (ref 18–29)
CREATININE: 0.61 mg/dL (ref 0.57–1.00)
Calcium: 9.2 mg/dL (ref 8.7–10.3)
Chloride: 97 mmol/L (ref 96–106)
GFR, EST AFRICAN AMERICAN: 100 mL/min/{1.73_m2} (ref >59)
GFR, EST NON AFRICAN AMERICAN: 87 mL/min/{1.73_m2} (ref >59)
GLUCOSE: 114 mg/dL — AB (ref 65–99)
Potassium: 4.7 mmol/L (ref 3.5–5.2)
SODIUM: 141 mmol/L (ref 134–144)

## 2016-03-01 DIAGNOSIS — Z87891 Personal history of nicotine dependence: Secondary | ICD-10-CM | POA: Diagnosis not present

## 2016-03-01 DIAGNOSIS — R490 Dysphonia: Secondary | ICD-10-CM | POA: Diagnosis not present

## 2016-03-01 DIAGNOSIS — R1310 Dysphagia, unspecified: Secondary | ICD-10-CM | POA: Diagnosis not present

## 2016-03-01 DIAGNOSIS — Z8521 Personal history of malignant neoplasm of larynx: Secondary | ICD-10-CM | POA: Diagnosis not present

## 2016-03-02 ENCOUNTER — Telehealth: Payer: Self-pay

## 2016-03-02 NOTE — Telephone Encounter (Signed)
Patient is aware and agreeable to normal results. 

## 2016-03-05 ENCOUNTER — Telehealth: Payer: Self-pay | Admitting: *Deleted

## 2016-03-05 ENCOUNTER — Other Ambulatory Visit: Payer: Self-pay | Admitting: Radiation Oncology

## 2016-03-05 ENCOUNTER — Ambulatory Visit: Payer: Medicare Other | Attending: Radiation Oncology

## 2016-03-05 DIAGNOSIS — C321 Malignant neoplasm of supraglottis: Secondary | ICD-10-CM

## 2016-03-05 DIAGNOSIS — R1310 Dysphagia, unspecified: Secondary | ICD-10-CM | POA: Diagnosis not present

## 2016-03-05 MED ORDER — HYDROCODONE-ACETAMINOPHEN 7.5-325 MG/15ML PO SOLN: 10.0000 mL | Freq: Four times a day (QID) | ORAL | 0 refills | Status: DC | PRN

## 2016-03-05 NOTE — Telephone Encounter (Signed)
Oncology Nurse Navigator Documentation  Received VM from Mr. Dobrinski indicating wife needs Rx refill for HYCET.  She has appt with SLP at 1100 at [facility], can pick up Rx then.  Dr. [name] and RN Anderson Malta notified.  [name], RN, BSN, [place] Neck Oncology Nurse [place] at [place] [phone]

## 2016-03-05 NOTE — Therapy (Signed)
[facility] [address] [place], [place], [address] Phone: [phone]   Fax:  [phone]  Speech Language Pathology Treatment  Patient Details  Name: Renee [name] MRN: [id number] Date of Birth: 03/05/1934 Referring Provider: Eppie Gibson MD  Encounter Date: 03/05/2016      End of Session - 03/05/16 1211    Visit Number 6   Number of Visits 8   Date for SLP Re-Evaluation 04/27/16   SLP Start Time 1107   SLP Stop Time  1153   SLP Time Calculation (min) 46 min   Activity Tolerance Patient tolerated treatment well      Past Medical History:  Diagnosis Date  . Anxiety    takes Xanax as needed  . Arthritis   . Brain aneurysm   . Chronic back pain    buldging disc  . Chronic bronchitis ([place])   . Chronic diastolic CHF (congestive heart failure) (HCC)    was on Lasix but has been off for a few months  . Chronic respiratory failure ([place])   . Coronary artery disease    a. CABG 09/2009. b. normal myoview in 05/2012.   [name] Dysphagia   . Emphysema lung ([place])   . Emphysema/COPD ([place])    on home O2  . GERD (gastroesophageal reflux disease)    takes Nexium daily  . History of colon polyps    benign  . History of radiation therapy 07/19/15- 09/08/15   Supraglottis and bilateral neck.   . History of shingles   . History of vertigo    doesn't take any meds  . HLD (hyperlipidemia)    can't take meds d/t joint aches/pains  . Hypertension   . Hypothyroidism    takes Synthroid daily  . IBS (irritable bowel syndrome)   . Internal hemorrhoids   . Macular degeneration    dry  . Myocardial infarction 2009   takes Eliquis daily.  . Orthostatic hypotension   . Osteoporosis   . PAF (paroxysmal atrial fibrillation) ([place])    a. Post op after CABG. Intolerant of amio in the past. b. Recurrence - started on Tikosyn 11/2013.  [name] Pneumonia    several yrs ago  . PONV (postoperative nausea and vomiting)   . RBBB    takes  Metoporolol daily  . Sciatic nerve pain     Past Surgical History:  Procedure Laterality Date  . APPENDECTOMY  1995  . ATRIAL FIBRILLATION ABLATION N/A 05/11/2014   Procedure: ATRIAL FIBRILLATION ABLATION;  Surgeon: Thompson Grayer, MD;  Location: [facility] CATH LAB;  Service: Cardiovascular;  Laterality: N/A;  . CARDIAC CATHETERIZATION  1990's; 06/2009  . CATARACT EXTRACTION W/ INTRAOCULAR LENS  IMPLANT, BILATERAL Bilateral   . CHOLECYSTECTOMY  2008  . colonosocpy    . CORONARY ARTERY BYPASS GRAFT  09/21/2009   CABG X2  . DIRECT LARYNGOSCOPY N/A 06/03/2015   Procedure: Direct laryngoscopy with biopsy left epiglottic lesion;  Surgeon: [name], MD;  Location: [facility];  Service: ENT;  Laterality: N/A;  . ESOPHAGOGASTRODUODENOSCOPY    . IR GENERIC HISTORICAL  11/15/2015   IR RADIOLOGIST EVAL & MGMT 11/15/2015 Saverio Danker, PA-C [facility] RAD  . JOINT REPLACEMENT    . LARYNX SURGERY  ~ 1960   "tumor removed"  . RETINAL LASER PROCEDURE Left 10/19/2013   "had a wrinkle in it"  . TEE WITHOUT CARDIOVERSION N/A 05/10/2014   Procedure: TRANSESOPHAGEAL ECHOCARDIOGRAM (TEE);  Surgeon: Sueanne Margarita, MD;  Location: [facility];  Service: Cardiovascular;  Laterality: N/A;  . TONSILLECTOMY AND ADENOIDECTOMY    . TOTAL ABDOMINAL HYSTERECTOMY  1986  . TOTAL HIP ARTHROPLASTY Right 11/01/2008  . TUBAL LIGATION  1973    There were no vitals filed for this visit.      Subjective Assessment - 03/05/16 1114    Subjective "I've been doing your exercises - I think they're helping!"               ADULT SLP TREATMENT - 03/05/16 1114      General Information   Behavior/Cognition Alert;Cooperative;Pleasant mood     Treatment Provided   Treatment provided Dysphagia     Dysphagia Treatment   Temperature Spikes Noted No   Respiratory Status Room air   Oral Cavity - Dentition Dentures, top   Treatment Methods Skilled observation;Compensation strategy training;Patient/caregiver education   Patient  observed directly with PO's Yes   Type of PO's observed Dysphagia 3 (soft);Thin liquids;Dysphagia 1 (puree)   Oral Phase Signs & Symptoms --  none noted   Pharyngeal Phase Signs & Symptoms Complaints of residue;Multiple swallows   Other treatment/comments No overt s/s aspiration PNA reported nor observed today. Pt reported she performed breath hold and pitch raise exercises routinely, but had forgotten about effortful swallow, Masako, and Mendelsohn. SLP re-educated pt with these today and encouraged pt to begin to do them at reduced reps (10) working up to 20, and 20+ PRN. SLP observed pt with cheese stick, applesauce, and water. Pt reportedly still timid about PO diet. SLP encouraged her that if she follows precautions of chin tuck and cough/reswallow after initial swallow of soft solids and thin liquids she should remain safe. Pt cont to report thick phlegm, reportedly green when expectorated, felt post swallow. SLP suspects that may be pt's feeling of tight UES as observed on modified (MBS).      Assessment / Recommendations / Plan   Plan Continue with current plan of care     Dysphagia Recommendations   Diet recommendations Dysphagia 3 (mechanical soft);Thin liquid   Compensations Hard cough after swallow  reswallow after cough   Postural Changes and/or Swallow Maneuvers Chin tuck     Progression Toward Goals   Progression toward goals Progressing toward goals          SLP Education - 03/05/16 1211    Education provided Yes   Education Details HEP (masako, mendelsohn, effortful swallow)   Person(s) Educated Patient;Spouse   Methods Explanation;Demonstration;Verbal cues;Handout   Comprehension Verbalized understanding;Returned demonstration;Verbal cues required;Need further instruction          SLP Short Term Goals - 12/12/15 1105      SLP SHORT TERM GOAL #1   Title pt will demo HEP with rare min A   Status Achieved     SLP SHORT TERM GOAL #2   Title pt will tell SLP why  she is completing HEP with modified independence over two therapy sessions   Status Achieved          SLP Long Term Goals - 03/05/16 1214      SLP LONG TERM GOAL #1   Title pt will demo HEP with modified independnce over two sessions   Baseline renewed 03-05-16   Time 2   Period --  therapy sessions, for all LTGs (i.e., 5 sessions including eval)   Status Not Met  and ongoing/renewed 03-05-16     SLP LONG TERM GOAL #2   Title pt will tell SLP how a food journal can assist her  in returning to full and least restrictive PO diet   Baseline renewed 03-05-16   Time 2   Period --  visits   Status Not Met  and ongoing/renewed 03-05-16     SLP LONG TERM GOAL #3   Title pt will tell SLP 3 s/s aspiration PNA with modified independence   Status Achieved     SLP LONG TERM GOAL #4   Title pt will demo swallow precautions from latest modified barium swalow eval with POs during ST x2 sessions   Time 2   Period --  visits   Status New          Plan - 03/05/16 1212    Clinical Impression Statement Pt demonstrated consistent throat clear with POs during the session today. She req'd occasional verbal cues for compensations with POs, faded to rare verbal cues. She cont to demonstrate timid nature about PO diet. See goal update for goals met/not met. Pt would cont to benefit from cont'd ST to assess proper completion of HEP as well as to assess for safety with POs. She may benefit from multi-disciplinary clinic to take advantage of both PT and dietician consults as well.   Speech Therapy Frequency --  approx every 4 weeks   Duration --  2 more visits, until 04-27-16   Treatment/Interventions Aspiration precaution training;SLP instruction and feedback;Compensatory strategies;Pharyngeal strengthening exercises;Oral motor exercises;Trials of upgraded texture/liquids;Cueing hierarchy;Patient/family education   Potential to Achieve Goals Good   Consulted and Agree with Plan of Care Patient       Patient will benefit from skilled therapeutic intervention in order to improve the following deficits and impairments:   Dysphagia, unspecified type    Problem List Patient Active Problem List   Diagnosis Date Noted  . Acute bronchitis 08/17/2015  . 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 Chronic obstructive pulmonary disease ([place]) 03/16/2015  . Moderate COPD (chronic obstructive pulmonary disease) ([place]) 03/16/2015  . COPD with chronic bronchitis ([place]) 12/13/2014  . Bibasilar crackles 12/13/2014  . Irritated throat 12/13/2014  . Chronic respiratory failure with hypoxia ([place]) 08/18/2014  . Cough 05/19/2014  . A-fib ([place]) 05/11/2014  . Orthostatic hypotension 01/18/2014  . HLD (hyperlipidemia)   . Hypertension   . Dizziness 11/30/2013  . Paroxysmal a-fib ([place]) 12/04/2012  . Palpitations 11/23/2011  . Hypothyroidism   . Varicose veins of lower extremities with other complications 38/88/2800  . IBS (irritable bowel syndrome)   . Bundle branch block, right   . Coronary artery disease   . Fatigue   . Bruises easily   . Joint pain   . Anxiety   . Chronic diastolic CHF (congestive heart failure) ([place]) 12/07/2009  . COPD (chronic obstructive pulmonary disease) ([place]) 12/07/2009  . GERD 06/27/2009  . PERSONAL HX COLONIC POLYPS 06/27/2009    [facility] ,MS, CCC-SLP  03/05/2016, 12:16 PM  [facility] [address] [place], [place], [address] Phone: [phone]   Fax:  [phone]   Name: Renee [name] MRN: [id number] Date of Birth: 1934-07-29

## 2016-03-08 ENCOUNTER — Ambulatory Visit (INDEPENDENT_AMBULATORY_CARE_PROVIDER_SITE_OTHER): Payer: Medicare Other | Admitting: Internal Medicine

## 2016-03-08 ENCOUNTER — Encounter: Payer: Self-pay | Admitting: Internal Medicine

## 2016-03-08 VITALS — BP 124/82 | HR 65 | Ht 66.0 in | Wt 132.6 lb

## 2016-03-08 DIAGNOSIS — R918 Other nonspecific abnormal finding of lung field: Secondary | ICD-10-CM | POA: Diagnosis not present

## 2016-03-08 DIAGNOSIS — C329 Malignant neoplasm of larynx, unspecified: Secondary | ICD-10-CM

## 2016-03-08 DIAGNOSIS — J449 Chronic obstructive pulmonary disease, unspecified: Secondary | ICD-10-CM | POA: Diagnosis not present

## 2016-03-08 NOTE — Addendum Note (Signed)
Addended by: Collier Salina on: 03/08/2016 12:51 PM   Modules accepted: Orders

## 2016-03-08 NOTE — Patient Instructions (Addendum)
Pulmonary infiltrate present on computed tomography Laryngeal squamous cell carcinoma (HCC)   - do CT chest without contrst 3rd week feb 2018 - 12 week followup  - will call with results  Moderate COPD (chronic obstructive pulmonary disease) (HCC) Stable copd Continue  DuoNeb 4 times daily with albuterol as needed  Follow-up - will call with CT chest results -return in 3 months or sooner if needed

## 2016-03-08 NOTE — Progress Notes (Signed)
Subjective:     Patient ID: Renee [name], female   DOB: 08/07/1934, 82 y.o.   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 Follow-up    Former PW pt. Pt saw TP on 10.25.16 as an acute visit. Pt states she feels no improvement since OV. Pt c/o prod cough with green mucus, increase SOB, chest congestion, PND. Pt c/o hemoptysis intermittenly since Jan. 2016. Pt states the mucus contains blood and discolored mucus    follow-up patient of Dr. Asencion Noble with COPD FEV1 64% in March 2014. CT scan chest in 2009 showing emphysema and low-dose CT scan chest May 2016 without any lung cancer.  She presents with her husband. At baseline she uses oxygen with exertion. I notice that she's not on any inhale maintenance drugs for her COPD. She tells me that since January 2016 he has had waxing and waning throat irritation that got particularly worse in April after she had a transesophageal echocardiogram and ablation. Apparently at this time she had some bleeding in her mouth. After that she is seen ENT evaluation. She is even been taken off her inhalers. She has an allergy listed to Spiriva as throat irritation. In addition chart review shows that she was also taken off Tudorza and none of these measures have helped her throat irritation. She has cough associated with this.  In support of this she says the last few weeks the genital irritation from her throat is worse associated with green mucus general shortness of breath and chest congestion. There is also some occasional hemoptysis with the same.  She wants a lighter oxygen tank but when she walked her 185 feet 3 laps on room air: She was able to walk only 2 laps and stopped due to fatigue and shortness of breath but did not desaturate.   Note: I personally  visualize  CT images    OV 03/16/2015  Chief  Complaint  Patient presents with  . Follow-up    Pt here after HRCT and PFT. Pt states her breathing is unchanged since last OV.  Pt states the spiriva respimat makes her cough more. Pt c/o prod cough with thick green mucus. Pt denies CP/tightness and f/c/s.     Follow-up moderate COPD with over perception of symptoms. Last seen November 2016. At that time I subjected her to the following evaluation listed below. It appears that only problem is COPD/emphysema. Gold stage II. In the interim she's developed some atrial fibrillation. She is known to have coronary artery disease. She says this is being taken care of by her cardiology team. She says that her bigger issues this throat irritation from Spiriva powder. She wants to try other inhalers. She last attended pulmonary rehabilitation 5 years ago after cardiac surgery. She is open to attending it again.   Full pulmonary function test February 2017 shows FEV1 1.61 L/73% post broncho-dilator response which is only 5% and a ratio of 81. Total lung capacity is 3.9 L/73%. DLCO 7.5/42%. Pulmonary function test suggests restriction. High-resolution CT chest 12/21/2014 paradoxically does not show any interstitial lung disease. Consider shows only emphysema. She does have coronary artery calcification. She did have overnight oxygen test November 2016 and she did  desaturate. However she tried nocturnal oxygen but it did not help her. She does not want to use oxygen  OV 06/15/2015  Chief Complaint  Patient presents with  . Follow-up    Pt states she her breathing is unchanged since last OV - pt states she has not been taking the Stiolto and Qvar because they make her cough. Pt states she was recently diagnosed with throat cancer. Pt states she has had an increase in prod cough with clear to green mucus.       Mod copd fu - copd is stable . This is 3 mo fu. Since seeing me saw Dr Caryl Comes EP in feb 2017 and noticed to have orthostatic tachycardia without  evidence of interval A Fib. Diuretic was stopped. Lopressors stopped. In itnermim due to voice issues saw ENT and underent bx - 06/03/15 of throat and dx with epiglottis and larynx Squamous cell Ca. This explains her throat issues. Because ioft his she wants to change to nebs. XRT neck pending    01/24/2016 Follow up : COPD  She presents for a six-month follow-up. She has known COPD. She says that her breathing has been stable without flare of cough or wheezing. She would like to get rid of her daytime oxygen. She has not been using for the last few months. Walk test in the office does not show any desaturations on room air.   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 She has finished radiation therapy. She is followed by oncology , radiation oncology and ENT. She is PEG dependent .  She had a PET scan for restaging last month that showed residual but improved hypermetabolism associated with the right epiglottis, extending into fairly to the vocal cords. No hypermetabolic adenopathy. There was a new irregular area in the apical segment of the right upper lobe that was mildly hypermetabolic. She is asymptomatic with no cough, congestion, hemoptysis. This was felt by radiation oncology to represent possibly inflammatory or radiation changes.    OV 03/08/2016  Chief Complaint  Patient presents with  . Follow-up    Pt states she recently got over for bronchitis, pt states she feels back to baseline. Pt c/o prod cough with green mucus and mid sternal chest pain since she has been sick. Pt deneis f/c/s.    Presents for 6 week follow-up. Her COPD stable. In terms of her squamous cell carcinoma of the larynx which involves epiglottis. In thanks giving 2017 she did have a PET scan that showed a right upper lobe infiltrate with groundglass opacities. According to review of the nurse practitioners no radiation oncology felt this was  post radiation or inflammatory changes. According to the patient is no follow-up scan ordered. Apparently both ENT and radiation oncology before the follow-up to pulmonary. She is tolerating nebulizer  and does not have any evidence of COPD exacerbation.    has a past medical history of Anxiety; Arthritis; Brain aneurysm; Chronic back pain; Chronic bronchitis ([place]); Chronic diastolic CHF (congestive heart failure) ([place]); Chronic respiratory failure ([place]); Coronary artery disease; Dysphagia; Emphysema lung ([place]); Emphysema/COPD ([place]); GERD (gastroesophageal reflux disease); History of colon polyps; History of radiation therapy (07/19/15- 09/08/15); History of shingles; History of vertigo; HLD (hyperlipidemia); Hypertension; Hypothyroidism; IBS (irritable bowel syndrome); Internal hemorrhoids; Macular degeneration; Myocardial infarction (2009); Orthostatic hypotension; Osteoporosis; PAF (paroxysmal atrial fibrillation) ([place]); Pneumonia; PONV (postoperative nausea and vomiting); RBBB; and Sciatic nerve pain.   reports that she has quit smoking. Her smoking use included Cigarettes. She has a 40.00 pack-year smoking history. She has never used smokeless tobacco.  Past Surgical History:  Procedure Laterality Date  . APPENDECTOMY  1995  . ATRIAL FIBRILLATION ABLATION N/A 05/11/2014   Procedure: ATRIAL FIBRILLATION ABLATION;  Surgeon: Thompson Grayer, MD;  Location: [facility] CATH LAB;  Service: Cardiovascular;  Laterality: N/A;  . CARDIAC CATHETERIZATION  1990's; 06/2009  . CATARACT EXTRACTION W/ INTRAOCULAR LENS  IMPLANT, BILATERAL Bilateral   . CHOLECYSTECTOMY  2008  . colonosocpy    . CORONARY ARTERY BYPASS GRAFT  09/21/2009   CABG X2  . DIRECT LARYNGOSCOPY N/A 06/03/2015   Procedure: Direct laryngoscopy with biopsy left epiglottic lesion;  Surgeon: [name], MD;  Location: [facility];  Service: ENT;  Laterality: N/A;  . ESOPHAGOGASTRODUODENOSCOPY    . IR GENERIC HISTORICAL  11/15/2015   IR RADIOLOGIST EVAL & MGMT  11/15/2015 Saverio Danker, PA-C [facility]-INTERV RAD  . JOINT REPLACEMENT    . LARYNX SURGERY  ~ 1960   "tumor removed"  . RETINAL LASER PROCEDURE Left 10/19/2013   "had a wrinkle in it"  . TEE WITHOUT CARDIOVERSION N/A 05/10/2014   Procedure: TRANSESOPHAGEAL ECHOCARDIOGRAM (TEE);  Surgeon: Sueanne Margarita, MD;  Location: [facility] ENDOSCOPY;  Service: Cardiovascular;  Laterality: N/A;  . TONSILLECTOMY AND ADENOIDECTOMY    . TOTAL ABDOMINAL HYSTERECTOMY  1986  . TOTAL HIP ARTHROPLASTY Right 11/01/2008  . TUBAL LIGATION  1973    Allergies  Allergen Reactions  . Levofloxacin Nausea Only    Unable to tolerate more than 7 days  . Penicillins Swelling and Rash    Swelling location undefined. Has patient had a PCN reaction causing immediate rash, facial/tongue/throat swelling, SOB or lightheadedness with hypotension: Yes Has patient had a PCN reaction causing severe rash involving mucus membranes or skin necrosis: No Has patient had a PCN reaction that required hospitalization Unk Has patient had a PCN reaction occurring within the last 10 years: Unk If all of the above answers are "NO", then may proceed with Cephalosporin use.   [name] Spiriva [Tiotropium Bromide Monohydrate] Other (See Comments)    Throat irritation  . Statins Other (See Comments)    Myalgias  . Budesonide-Formoterol Fumarate Other (See Comments)    Tachycardia  . Morphine Other (See Comments)    double vision  . Phenazopyridine Hcl Other (See Comments)    *piridium* face red  . Pneumococcal Vaccines Swelling    [place] of the skin  . Prednisone Swelling    Can't take PO but injectable is fine  . Sulfa Antibiotics Rash  . Sulfonamide Derivatives Rash    Immunization History  Administered Date(s) Administered  . Influenza Split 10/08/2011  . Influenza Whole 10/06/2009, 12/07/2010  . Influenza,inj,Quad PF,36+ Mos 11/13/2012, 12/07/2014, 10/31/2015  . Influenza-Unspecified 11/13/2013  . Pneumococcal Polysaccharide-23 02/06/2007     Family History  Problem Relation Age of Onset  . Heart disease Mother   . Transient ischemic attack Mother   . Hypertension Mother   . Deep vein thrombosis Mother   . Heart attack Father   . Hypertension Father   . Diabetes Father   . Hyperlipidemia Father   . Breast cancer Sister   . Myasthenia gravis Brother   . Brain cancer Sister     mets from breast  . Colon polyps Brother   . Hypertension Brother   . Colon cancer Daughter   . Colon cancer Other     nephew     Current Outpatient Prescriptions:  .  ALPRAZolam (XANAX) 0.25 MG tablet, Take 0.125 mg by mouth 2 (two) times daily as needed for anxiety  or sleep. , Disp: , Rfl:  .  apixaban (ELIQUIS) 5 MG TABS tablet, Take 1 tablet (5 mg total) by mouth 2 (two) times daily., Disp: 60 tablet, Rfl: 1 .  cholecalciferol (VITAMIN D) 1000 UNITS tablet, Take 1,000 Units by mouth daily., Disp: , Rfl:  .  HYDROcodone-acetaminophen (HYCET) 7.5-325 mg/15 ml solution, Place 10 mLs into feeding tube 4 (four) times daily as needed for moderate pain., Disp: 750 mL, Rfl: 0 .  lansoprazole (PREVACID SOLUTAB) 30 MG disintegrating tablet, Take 30 mg by mouth daily., Disp: , Rfl:  .  levothyroxine (SYNTHROID, LEVOTHROID) 25 MCG tablet, Take 50 mcg by mouth daily before breakfast. , Disp: , Rfl:  .  metoprolol succinate (TOPROL-XL) 25 MG 24 hr tablet, Take 1 tablet (25 mg total) by mouth daily., Disp: , Rfl:  .  nitroGLYCERIN (NITROSTAT) 0.4 MG SL tablet, Place 0.4 mg under the tongue every 5 (five) minutes x 3 doses as needed for chest pain. , Disp: , Rfl:  .  Nutritional Supplements (FEEDING SUPPLEMENT, OSMOLITE 1.5 CAL,) LIQD, Give 1 can of Osmolite 1.5 or equivalent - 5 times daily with 60 cc free water before and after bolus feeding via PEG.  Give additional 240 cc water BID. Send formula and supplies., Disp: 1185 mL, Rfl: 0 .  ondansetron (ZOFRAN) 4 MG tablet, Take 1 tablet (4 mg total) by mouth every 8 (eight) hours as needed for nausea or  vomiting., Disp: 50 tablet, Rfl: 3 .  OXYGEN, Inhale 2 L into the lungs as directed. As needed for shortness of breath, Disp: , Rfl:  .  Polyethyl Glycol-Propyl Glycol (SYSTANE OP), Place 1 drop into both eyes 2 (two) times daily. , Disp: , Rfl:  .  senna (SENOKOT) 8.6 MG tablet, Take 1 tablet by mouth daily as needed for constipation. , Disp: , Rfl:  .  sodium fluoride (FLUORISHIELD) 1.1 % GEL dental gel, Instill one drop of gel per tooth space of fluoride tray. Place over teeth for 5 minutes. Remove. Spit out excess. Repeat nightly., Disp: 120 mL, Rfl: prn .  albuterol (PROAIR HFA) 108 (90 Base) MCG/ACT inhaler, Inhale 2 puffs into the lungs every 6 (six) hours as needed for wheezing or shortness of breath. , Disp: , Rfl:     Review of Systems     Objective:   Physical Exam  Constitutional: She is oriented to person, place, and time. She appears well-developed and well-nourished. No distress.  HENT:  Head: Normocephalic and atraumatic.  Right Ear: External ear normal.  Left Ear: External ear normal.  Mouth/Throat: Oropharynx is clear and moist. No oropharyngeal exudate.  Rt submandiblar region firm Hoarse voice  Eyes: Conjunctivae and EOM are normal. Pupils are equal, round, and reactive to light. Right eye exhibits no discharge. Left eye exhibits no discharge. No scleral icterus.  Neck: Normal range of motion. Neck supple. No JVD present. No tracheal deviation present. No thyromegaly present.  Cardiovascular: Normal rate, regular rhythm, normal heart sounds and intact distal pulses.  Exam reveals no gallop and no friction rub.   No murmur heard. Pulmonary/Chest: Effort normal and breath sounds normal. No respiratory distress. She has no wheezes. She has no rales. She exhibits no tenderness.  Abdominal: Soft. Bowel sounds are normal. She exhibits no distension and no mass. There is no tenderness. There is no rebound and no guarding.  Musculoskeletal: Normal range of motion. She exhibits  no edema or tenderness.  Lymphadenopathy:    She has no cervical adenopathy.  Neurological: She is alert and oriented to person, place, and time. She has normal reflexes. No cranial nerve deficit. She exhibits normal muscle tone. Coordination normal.  Skin: Skin is warm and dry. No rash noted. She is not diaphoretic. No erythema. No pallor.  Psychiatric: She has a normal mood and affect. Her behavior is normal. Judgment and thought content normal.  Vitals reviewed.  Vitals:   03/08/16 1226  BP: 124/82  Pulse: 65  SpO2: 95%  Weight: 132 lb 9.6 oz (60.1 kg)  Height: 5\' 6"  (1.676 m)    Estimated body mass index is 21.4 kg/m as calculated from the following:   Height as of this encounter: 5\' 6"  (1.676 m).   Weight as of this encounter: 132 lb 9.6 oz (60.1 kg).     Assessment:       ICD-9-CM ICD-10-CM   1. Pulmonary infiltrate present on computed tomography 793.19 R91.8   2. Laryngeal squamous cell carcinoma (HCC) 161.9 C32.9   3. Moderate COPD (chronic obstructive pulmonary disease) (HCC) 496 J44.9        Plan:     Pulmonary infiltrate present on computed tomography Laryngeal squamous cell carcinoma (HCC)   - do CT chest without contrst 3rd week feb 2018 - 12 week followup  - will call with results  Moderate COPD (chronic obstructive pulmonary disease) (HCC) Stable copd Continue  DuoNeb 4 times daily with albuterol as needed  Follow-up - will call with CT chest results -return in 3 months or sooner if needed  Dr. Brand Males, M.D., [facility].C.P Pulmonary and Critical Care Medicine Staff Physician [place] [facility] Pager: [phone], If no answer or between  15:00h - 7:00h: call [phone]  03/08/2016 12:47 PM

## 2016-03-22 ENCOUNTER — Other Ambulatory Visit: Payer: Self-pay | Admitting: Radiation Oncology

## 2016-03-22 DIAGNOSIS — C321 Malignant neoplasm of supraglottis: Secondary | ICD-10-CM

## 2016-03-22 MED ORDER — HYDROCODONE-ACETAMINOPHEN 7.5-325 MG/15ML PO SOLN: 10.0000 mL | Freq: Four times a day (QID) | ORAL | 0 refills | Status: DC | PRN

## 2016-03-28 ENCOUNTER — Ambulatory Visit (INDEPENDENT_AMBULATORY_CARE_PROVIDER_SITE_OTHER)
Admission: RE | Admit: 2016-03-28 | Discharge: 2016-03-28 | Disposition: A | Discharge status: Home / Self Care | Payer: Medicare Other | Source: Ambulatory Visit | Attending: Internal Medicine | Admitting: Internal Medicine

## 2016-03-28 DIAGNOSIS — R918 Other nonspecific abnormal finding of lung field: Secondary | ICD-10-CM

## 2016-03-30 ENCOUNTER — Telehealth: Payer: Self-pay | Admitting: Internal Medicine

## 2016-03-30 DIAGNOSIS — R918 Other nonspecific abnormal finding of lung field: Secondary | ICD-10-CM

## 2016-03-30 NOTE — Telephone Encounter (Signed)
Ct chest  03/28/16 show persistent RUL 1.2cm density. Good news is that is stable but concerned is not gone. Could be scar from radioation. Could be inflammation. Could be cancer. She should discuss this with Dr Lanell Persons in march 2018 visit (rad onc). I will discuss with her may 2018 visti. My recommendation is CT chest wo contrst 3 months from 03/28/16 and followup after that - so change 06/05/16 visit to after 06/25/16  Dr. Brand Males, M.D., [facility].C.P Pulmonary and Critical Care Medicine Staff Physician [place] [facility] Pager: [phone], If no answer or between  15:00h - 7:00h: call [phone]  03/30/2016 2:47 PM     Ct Chest Wo Contrast  Result Date: 03/28/2016 CLINICAL DATA:  History of esophageal cancer. Followup pulmonary opacities identified fine examination from 12/2015. EXAM: CT CHEST WITHOUT CONTRAST TECHNIQUE: Multidetector CT imaging of the chest was performed following the standard protocol without IV contrast. COMPARISON:  None. FINDINGS: Cardiovascular: The heart size appears normal. There is no pericardial effusion. Aortic atherosclerosis. Previous median sternotomy and CABG procedure. Mediastinum/Nodes: No enlarged mediastinal or axillary lymph nodes. Thyroid gland, trachea, and esophagus demonstrate no significant findings. Lungs/Pleura: There is moderate change at of centrilobular emphysema. Diffuse bronchial wall thickening. Persistent the irregular airspace density within the right upper lobe apex measuring 1.2 cm of image 24 of series 3. Scar like density noted within the left lower lobe. Upper Abdomen: No acute abnormality. Musculoskeletal: No chest wall mass or suspicious bone lesions identified. IMPRESSION: 1. Persistent 1.2 cm nodular density within the right lung apex which showed mild hypermetabolism on previous exam. This is not significantly changed in size from 01/02/2016. Findings may reflect sequelae of prior inflammation or  infection. Although stability since 01/02/2016 favors a benign process Metastatic disease or primary lung neoplasm not excluded. Continued interval follow-up is advised to ensure stability of this abnormality. Suggest followup imaging at 6-12 months. 2. Diffuse bronchial wall thickening with emphysema, as above; imaging findings suggestive of underlying COPD. Electronically Signed   By: [name] Moors M.D.   On: 03/28/2016 14:45

## 2016-03-30 NOTE — Telephone Encounter (Signed)
Called and spoke to pt. Informed her of the results and recs per MR. Order placed for CT chest. Pt verbalized understanding and denied any further questions or concerns at this time.    PCCs please advise when CT is scheduled so OV can be scheduled. Thanks.

## 2016-04-03 DIAGNOSIS — K219 Gastro-esophageal reflux disease without esophagitis: Secondary | ICD-10-CM | POA: Diagnosis not present

## 2016-04-03 DIAGNOSIS — R1312 Dysphagia, oropharyngeal phase: Secondary | ICD-10-CM | POA: Diagnosis not present

## 2016-04-03 DIAGNOSIS — Z87891 Personal history of nicotine dependence: Secondary | ICD-10-CM | POA: Diagnosis not present

## 2016-04-03 DIAGNOSIS — J387 Other diseases of larynx: Secondary | ICD-10-CM | POA: Diagnosis not present

## 2016-04-05 ENCOUNTER — Encounter: Payer: Self-pay | Admitting: Radiation Oncology

## 2016-04-05 NOTE — Progress Notes (Addendum)
Head and Neck Cancer Location of Tumor / Histology:  06/03/15 Diagnosis 1. Epiglottis, biopsy - SQUAMOUS CELL CARCINOMA. - SEE COMMENT. 2. Larynx, biopsy, right lateral wall - SQUAMOUS CELL CARCINOMA.  Patient presented months ago with symptoms of: She reports increased swallowing difficulties per Dr. [name] note on 04/03/16 during an office visit.   Biopsies of revealed:  Biopsy scheduled for 04/12/16 by Dr. [name].   Nutrition Status Yes No Comments  Weight changes? []  [x]  She reports gaining a few pounds recently  Swallowing concerns? []  []    PEG? [x]  []  She reports feeling nausea every morning with her first feeding, she will cough and gag for several minutes. She does well with other tube feedings later in the day. She tells me she is not taking anything oral during this time period. She is instilling 4 cans of osmolite daily   Referrals Yes No Comments  Social Work? []  [x]    Dentistry? []  [x]    Swallowing therapy? [x]  []  She last saw [name] late January. She did have a barium swallow recently, and was told that she should be able to swallow.   Nutrition? []  [x]    Med/Onc? []  [x]     Safety Issues Yes No Comments  Prior radiation? [x]  []  Radiation treatment dates:  07/19/2015-09/08/2015  Site/dose:    1. The supraglottis and bilateral neck were treated PTV High to 70 Gy in 35 fractions at 2 Gy per fraction.  2. The supraglottis and bilateral neck were treated PTV Med to 63 Gy in 35 fractions at 1.8 Gy per fraction. 3. The supraglottis and bilateral neck were treated PTV Low to 56 Gy in 35 fractions at 1.6 Gy per fraction.  Pacemaker/ICD? []  [x]    Possible current pregnancy? []  [x]    Is the patient on methotrexate? []  [x]     Tobacco/Marijuana/Snuff/ETOH use:  She denies.   Past/Anticipated interventions by otolaryngology, if any: Dr. [name] documents 04/03/16: Renee [name] is a 82 y.o. female with new finding from last exam of an ulceration of the right larynx. As previously  mentioned to her this probably is a persistent or recurrent disease. She could have chondroradionecrosis but she definitely needs a biopsy performed with a direct laryngoscopy and esophagoscopy. She does this time agreed to that. We discussed the procedure including risks, benefits, and options. All her questions were answered and consent was obtained.   04/12/16 Direct Laryngoscopy and Esophagoscopy planned by Dr. [name].   Past/Anticipated interventions by medical oncology, if any: N/A  Current Complaints / other details:   She reports increased difficulty swallowing. She has increased pain when swallowing. She reports feeling like there is an abscess over her the Right side of her face which extends to her Right Neck area.  She states that she needs another prescription for Hycet today, as she just has one dose left at home. She reports feeling a irregular heart rate. She has concerns about her potassium level, and her procedure tomorrow, and wonders if she should have labs drawn today.  03/28/16 CT Chest IMPRESSION: 1. Persistent 1.2 cm nodular density within the right lung apex which showed mild hypermetabolism on previous exam. This is not significantly changed in size from 01/02/2016. Findings may reflect sequelae of prior inflammation or infection. Although stability since 01/02/2016 favors a benign process Metastatic disease or primary lung neoplasm not excluded. Continued interval follow-up is advised to ensure stability of this abnormality. Suggest followup imaging at 6-12 months. 2. Diffuse bronchial wall thickening with emphysema, as above; imaging findings  suggestive of underlying COPD.  BP 121/79   Pulse (!) 101   Temp 98.3 F (36.8 C)   Ht 5\' 6"  (1.676 m)   Wt 134 lb (60.8 kg)   SpO2 100% Comment: room air  BMI 21.63 kg/m    Wt Readings from Last 3 Encounters:  04/11/16 134 lb (60.8 kg)  03/08/16 132 lb 9.6 oz (60.1 kg)  02/28/16 132 lb (59.9 kg)

## 2016-04-09 ENCOUNTER — Ambulatory Visit: Payer: Self-pay

## 2016-04-09 ENCOUNTER — Telehealth: Payer: Self-pay | Admitting: Internal Medicine

## 2016-04-09 NOTE — Telephone Encounter (Signed)
Request for surgical clearance:  1. What type of surgery is being performed?Direct Laryngoscopy and  Esophagoscopy   2. When is this surgery scheduled? 04-12-16   3. Are there any medications that need to be held prior to surgery and how long?Can she stop her Eliquis and when shoukd she stop it?   4. Name of physician performing surgery? Dr [name]   5. What is your office phone and fax number? Need this [phone] and fax is [phone]

## 2016-04-09 NOTE — Telephone Encounter (Signed)
Per protocol, pt with afib and CHADS <4 and no history of stroke ok to hold Eliquis 24 hours prior to procedure.   Faxed to number provided.

## 2016-04-10 ENCOUNTER — Telehealth: Payer: Self-pay | Admitting: Internal Medicine

## 2016-04-10 NOTE — Telephone Encounter (Signed)
Follow up    rn glenda is calling from [facility] about the previous message   pertaining to surgical clearance and if the eliquis can be held  Please call today

## 2016-04-10 NOTE — Telephone Encounter (Signed)
Patient is calling and states that she is scheduled to have a biopsy on Thursday. She is supposed to stop her Eliquis but is unsure when to do that. Please call to discuss, thanks.

## 2016-04-10 NOTE — Telephone Encounter (Signed)
Patient was called and told to hold eliquis 24 hours prior to her procedure per Eino Farber, [facility] recommendation on 04/09/16. Patient verbalized understanding.  Holley Raring, RN with Dr. [name] office wasalso called and notified. She states that she has everything else that she needs.

## 2016-04-11 ENCOUNTER — Encounter: Payer: Self-pay | Admitting: Radiation Oncology

## 2016-04-11 ENCOUNTER — Ambulatory Visit
Admission: RE | Admit: 2016-04-11 | Discharge: 2016-04-11 | Disposition: A | Discharge status: Home / Self Care | Payer: Medicare Other | Source: Ambulatory Visit | Attending: Radiation Oncology | Admitting: Radiation Oncology

## 2016-04-11 ENCOUNTER — Other Ambulatory Visit: Payer: Self-pay | Admitting: Otolaryngology

## 2016-04-11 ENCOUNTER — Encounter (HOSPITAL_COMMUNITY)
Admission: RE | Admit: 2016-04-11 | Discharge: 2016-04-11 | Disposition: A | Discharge status: Home / Self Care | Payer: Medicare Other | Source: Ambulatory Visit | Attending: Otolaryngology | Admitting: Otolaryngology

## 2016-04-11 ENCOUNTER — Encounter (HOSPITAL_COMMUNITY): Payer: Self-pay

## 2016-04-11 DIAGNOSIS — Z880 Allergy status to penicillin: Secondary | ICD-10-CM | POA: Insufficient documentation

## 2016-04-11 DIAGNOSIS — C321 Malignant neoplasm of supraglottis: Secondary | ICD-10-CM

## 2016-04-11 DIAGNOSIS — Z87891 Personal history of nicotine dependence: Secondary | ICD-10-CM | POA: Diagnosis not present

## 2016-04-11 DIAGNOSIS — Z931 Gastrostomy status: Secondary | ICD-10-CM | POA: Insufficient documentation

## 2016-04-11 DIAGNOSIS — Z9981 Dependence on supplemental oxygen: Secondary | ICD-10-CM | POA: Diagnosis not present

## 2016-04-11 DIAGNOSIS — K219 Gastro-esophageal reflux disease without esophagitis: Secondary | ICD-10-CM | POA: Diagnosis not present

## 2016-04-11 DIAGNOSIS — I480 Paroxysmal atrial fibrillation: Secondary | ICD-10-CM | POA: Diagnosis not present

## 2016-04-11 DIAGNOSIS — Z885 Allergy status to narcotic agent status: Secondary | ICD-10-CM | POA: Diagnosis not present

## 2016-04-11 DIAGNOSIS — Z923 Personal history of irradiation: Secondary | ICD-10-CM | POA: Insufficient documentation

## 2016-04-11 DIAGNOSIS — R1310 Dysphagia, unspecified: Secondary | ICD-10-CM | POA: Diagnosis not present

## 2016-04-11 DIAGNOSIS — I2510 Atherosclerotic heart disease of native coronary artery without angina pectoris: Secondary | ICD-10-CM | POA: Diagnosis not present

## 2016-04-11 DIAGNOSIS — I4510 Unspecified right bundle-branch block: Secondary | ICD-10-CM | POA: Diagnosis not present

## 2016-04-11 DIAGNOSIS — Z888 Allergy status to other drugs, medicaments and biological substances status: Secondary | ICD-10-CM | POA: Diagnosis not present

## 2016-04-11 DIAGNOSIS — C329 Malignant neoplasm of larynx, unspecified: Secondary | ICD-10-CM | POA: Diagnosis not present

## 2016-04-11 DIAGNOSIS — I739 Peripheral vascular disease, unspecified: Secondary | ICD-10-CM | POA: Diagnosis not present

## 2016-04-11 DIAGNOSIS — M542 Cervicalgia: Secondary | ICD-10-CM | POA: Insufficient documentation

## 2016-04-11 DIAGNOSIS — Z7901 Long term (current) use of anticoagulants: Secondary | ICD-10-CM | POA: Diagnosis not present

## 2016-04-11 DIAGNOSIS — Z951 Presence of aortocoronary bypass graft: Secondary | ICD-10-CM | POA: Diagnosis not present

## 2016-04-11 DIAGNOSIS — R911 Solitary pulmonary nodule: Secondary | ICD-10-CM | POA: Insufficient documentation

## 2016-04-11 DIAGNOSIS — I252 Old myocardial infarction: Secondary | ICD-10-CM | POA: Diagnosis not present

## 2016-04-11 DIAGNOSIS — Z9221 Personal history of antineoplastic chemotherapy: Secondary | ICD-10-CM | POA: Diagnosis not present

## 2016-04-11 DIAGNOSIS — Z8521 Personal history of malignant neoplasm of larynx: Secondary | ICD-10-CM | POA: Diagnosis not present

## 2016-04-11 DIAGNOSIS — Z882 Allergy status to sulfonamides status: Secondary | ICD-10-CM | POA: Diagnosis not present

## 2016-04-11 DIAGNOSIS — I340 Nonrheumatic mitral (valve) insufficiency: Secondary | ICD-10-CM | POA: Diagnosis not present

## 2016-04-11 DIAGNOSIS — E039 Hypothyroidism, unspecified: Secondary | ICD-10-CM | POA: Diagnosis not present

## 2016-04-11 DIAGNOSIS — Z96641 Presence of right artificial hip joint: Secondary | ICD-10-CM | POA: Diagnosis not present

## 2016-04-11 DIAGNOSIS — F419 Anxiety disorder, unspecified: Secondary | ICD-10-CM | POA: Diagnosis not present

## 2016-04-11 DIAGNOSIS — Z978 Presence of other specified devices: Secondary | ICD-10-CM | POA: Diagnosis not present

## 2016-04-11 DIAGNOSIS — I890 Lymphedema, not elsewhere classified: Secondary | ICD-10-CM | POA: Insufficient documentation

## 2016-04-11 DIAGNOSIS — Z79899 Other long term (current) drug therapy: Secondary | ICD-10-CM | POA: Insufficient documentation

## 2016-04-11 DIAGNOSIS — I10 Essential (primary) hypertension: Secondary | ICD-10-CM | POA: Diagnosis not present

## 2016-04-11 DIAGNOSIS — J387 Other diseases of larynx: Secondary | ICD-10-CM | POA: Diagnosis present

## 2016-04-11 DIAGNOSIS — Z8673 Personal history of transient ischemic attack (TIA), and cerebral infarction without residual deficits: Secondary | ICD-10-CM | POA: Diagnosis not present

## 2016-04-11 DIAGNOSIS — J439 Emphysema, unspecified: Secondary | ICD-10-CM | POA: Diagnosis not present

## 2016-04-11 HISTORY — DX: Dependence on supplemental oxygen: Z99.81

## 2016-04-11 HISTORY — DX: Malignant (primary) neoplasm, unspecified: C80.1

## 2016-04-11 LAB — CBC
HEMATOCRIT: 42.3 % (ref 36.0–46.0)
HEMOGLOBIN: 13.8 g/dL (ref 12.0–15.0)
MCH: 31.9 pg (ref 26.0–34.0)
MCHC: 32.6 g/dL (ref 30.0–36.0)
MCV: 97.9 fL (ref 78.0–100.0)
Platelets: 274 10*3/uL (ref 150–400)
RBC: 4.32 MIL/uL (ref 3.87–5.11)
RDW: 13.7 % (ref 11.5–15.5)
WBC: 6.1 10*3/uL (ref 4.0–10.5)

## 2016-04-11 LAB — BASIC METABOLIC PANEL
ANION GAP: 10 (ref 5–15)
BUN: 25 mg/dL — ABNORMAL HIGH (ref 6–20)
CO2: 28 mmol/L (ref 22–32)
Calcium: 9.8 mg/dL (ref 8.9–10.3)
Chloride: 98 mmol/L — ABNORMAL LOW (ref 101–111)
Creatinine, Ser: 0.77 mg/dL (ref 0.44–1.00)
GFR calc Af Amer: >60 mL/min (ref >60)
GFR calc non Af Amer: >60 mL/min (ref >60)
GLUCOSE: 128 mg/dL — AB (ref 65–99)
POTASSIUM: 4.1 mmol/L (ref 3.5–5.1)
Sodium: 136 mmol/L (ref 135–145)

## 2016-04-11 LAB — PROTIME-INR
INR: 1.21
Prothrombin Time: 15.4 seconds — ABNORMAL HIGH (ref 11.4–15.2)

## 2016-04-11 MED ORDER — HYDROCODONE-ACETAMINOPHEN 7.5-325 MG/15ML PO SOLN: 10.0000 mL | Freq: Four times a day (QID) | ORAL | 0 refills | Status: DC | PRN

## 2016-04-11 NOTE — Pre-Procedure Instructions (Signed)
Renee [name]  04/11/2016      [facility], [place] - [address] AT [place] [address] Phone: [phone] Fax: [phone]  [facility] [place], [place] [address] [place] [address] Phone: [phone] Fax: [phone]    Your procedure is scheduled on March 8  Report to [place] at [email or web address].  Call this number if you have problems the morning of surgery:  [phone]   Remember:  Do not eat food or drink liquids after midnight.   Take these medicines the morning of surgery with A SIP OF WATER ALPRAZolam (XANAX), HYDROcodone-acetaminophen (HYCET), lansoprazole (PREVACID SOLUTAB), levothyroxine (SYNTHROID, LEVOTHROID), metoprolol succinate (TOPROL-XL), nitroGLYCERIN (NITROSTAT) if needed, ondansetron (ZOFRAN), eye drops  7 days prior to surgery STOP taking any Aspirin, Aleve, Naproxen, Ibuprofen, Motrin, Advil, Goody's, BC's, all herbal medications, fish oil, and all vitamins   Do not wear jewelry, make-up or nail polish.  Do not wear lotions, powders, or perfumes, or deoderant.  Do not shave 48 hours prior to surgery.  Men may shave face and neck.  Do not bring valuables to the hospital.  [facility] is not responsible for any belongings or valuables.  Contacts, dentures or bridgework may not be worn into surgery.  Leave your suitcase in the car.  After surgery it may be brought to your room.  For patients admitted to the hospital, discharge time will be determined by your treatment team.  Patients discharged the day of surgery will not be allowed to drive home.    Special instructions:   [place]- Preparing For Surgery  Before surgery, you can play an important role. Because skin is not sterile, your skin needs to be as free of germs as possible. You can reduce the number of germs on your skin by washing  with CHG (chlorahexidine gluconate) Soap before surgery.  CHG is an antiseptic cleaner which kills germs and bonds with the skin to continue killing germs even after washing.  Please do not use if you have an allergy to CHG or antibacterial soaps. If your skin becomes reddened/irritated stop using the CHG.  Do not shave (including legs and underarms) for at least 48 hours prior to first CHG shower. It is OK to shave your face.  Please follow these instructions carefully.   1. Shower the NIGHT BEFORE SURGERY and the MORNING OF SURGERY with CHG.   2. If you chose to wash your hair, wash your hair first as usual with your normal shampoo.  3. After you shampoo, rinse your hair and body thoroughly to remove the shampoo.  4. Use CHG as you would any other liquid soap. You can apply CHG directly to the skin and wash gently with a scrungie or a clean washcloth.   5. Apply the CHG Soap to your body ONLY FROM THE NECK DOWN.  Do not use on open wounds or open sores. Avoid contact with your eyes, ears, mouth and genitals (private parts). Wash genitals (private parts) with your normal soap.  6. Wash thoroughly, paying special attention to the area where your surgery will be performed.  7. Thoroughly rinse your body with warm water from the neck down.  8. DO NOT shower/wash with your normal soap after using and rinsing off the CHG Soap.  9. Pat yourself dry with a CLEAN TOWEL.  10. Wear CLEAN PAJAMAS   11. Place CLEAN SHEETS on your bed the night of your first shower and DO NOT SLEEP WITH PETS.    Day of Surgery: Do not apply any deodorants/lotions. Please wear clean clothes to the hospital/surgery center.      Please read over the following fact sheets that you were given.

## 2016-04-11 NOTE — Progress Notes (Signed)
Pt is a 82 year old female scheduled for direct laryngoscopy, esophagoscopy on 04/12/2016 with [name], M.D.  PCP is [name], MD.  EP cardiologist is Virl Axe, MD, last office visit 02/28/16.  Cardiologist is Peter Martinique, MD (last office visit 06/07/14 with Truitt Merle, NP).  Pulmonologist is Brand Males, MD, last office visit 03/08/16.   PMH includes:  CAD/MI (s/p CABG 2011), PAF (s/p ablation 05/2014), chronic diastolic CHF, RBBB, HTN, hyperlipidemia, chronic respiratory failure, COPD/emphysema, uses home oxygen 2L at night, brain aneurysm, hypothyroidism, laryngeal squamous cell carcinoma, post-op N/V, GERD. Former smoker. BMI 21.5. S/p direct laryngoscopy 06/03/15.   Medications include: eliquis, prevacid, levothyroxine, metoprolol. Last does eliquis 04/10/16  Preoperative labs reviewed.  PT 15.4.   EKG 02/28/16: NSR with possible PACs with aberrant conduction. RBBB.   Holter monitor 06/18/14: Sinus rhythm with rare PVCs and PACs. No sustained arrhythmias.  Echo 06/18/14:  - Left ventricle: LVEF is approximately 50% with akinesis of the posterior wall. The cavity size was normal. Wall thickness was normal. Doppler parameters are consistent with abnormal left ventricular relaxation (grade 1 diastolic dysfunction). - Mitral valve: There was mild regurgitation. - Impressions: Poor acoustic windows limit study.  Nuclear stress test 02/01/14: Normal stress nuclear study. LV Ejection Fraction: 66%. LV Wall Motion: NL LV Function; NL Wall Motion.  No cardiac cath since prior to CABG.   If no changes, I anticipate pt can proceed with surgery as scheduled.   Willeen Cass, FNP-BC [facility]/Anesthesiology Phone: [phone] 04/11/2016 2:47 PM

## 2016-04-11 NOTE — Anesthesia Preprocedure Evaluation (Addendum)
Anesthesia Evaluation  Patient identified by MRN, date of birth, ID band Patient awake  General Assessment Comment:CAD/MI (s/p CABG 2011), PAF (s/p ablation 05/2014), chronic diastolic CHF, RBBB, HTN, hyperlipidemia, chronic respiratory failure, COPD/emphysema, uses home oxygen, brain aneurysm, hypothyroidism, post-op N/V, GERD. Former smoker. BMI 22  Reviewed: Allergy & Precautions, NPO status , Patient's Chart, lab work & pertinent test results, reviewed documented beta blocker date and time   History of Anesthesia Complications (+) history of anesthetic complications  Airway Mallampati: IV  TM Distance: >3 FB Neck ROM: Limited  Mouth opening: Limited Mouth Opening  Dental  (+) Dental Advisory Given, Upper Dentures   Pulmonary COPD,  COPD inhaler and oxygen dependent, former smoker,    breath sounds clear to auscultation       Cardiovascular hypertension, Pt. on home beta blockers and Pt. on medications + CAD, + Past MI, + Peripheral Vascular Disease and +CHF  + dysrhythmias (s/p Ablation) Atrial Fibrillation  Rhythm:Regular Rate:Normal  PAC's NSR. RBBB.   Holter monitor 06/18/14: Sinus rhythm with rare PVCs and PACs. No sustained arrhythmias.  Echo 06/18/14:  - Left ventricle: LVEF is approximately 50% with akinesis of the posterior wall. The cavity size was normal. Wall thickness was normal. Doppler parameters are consistent with abnormal left ventricular relaxation (grade 1 diastolic dysfunction). - Mitral valve: There was mild regurgitation. - Impressions: Poor acoustic windows limit study.  Nuclear stress test 02/01/14: Normal stress nuclear study. LV Ejection Fraction: 66%. LV Wall Motion: NL LV Function; NL Wall Motion.    Neuro/Psych PSYCHIATRIC DISORDERS Anxiety Brain aneurysm  TIA Neuromuscular disease (Sciatica)    GI/Hepatic Neg liver ROS, GERD  Medicated,  Endo/Other  Hypothyroidism   Renal/GU negative Renal  ROS     Musculoskeletal  (+) Arthritis ,   Abdominal   Peds  Hematology   Anesthesia Other Findings   Reproductive/Obstetrics                            Lab Results  Component Value Date   WBC 6.1 04/11/2016   HGB 13.8 04/11/2016   HCT 42.3 04/11/2016   MCV 97.9 04/11/2016   PLT 274 04/11/2016   Lab Results  Component Value Date   CREATININE 0.77 04/11/2016   BUN 25 (H) 04/11/2016   NA 136 04/11/2016   K 4.1 04/11/2016   CL 98 (L) 04/11/2016   CO2 28 04/11/2016    Anesthesia Physical  Anesthesia Plan  ASA: III  Anesthesia Plan: General   Post-op Pain Management:    Induction: Intravenous  Airway Management Planned: Oral ETT and Video Laryngoscope Planned  Additional Equipment:   Intra-op Plan:   Post-operative Plan: Extubation in OR  Informed Consent: I have reviewed the patients History and Physical, chart, labs and discussed the procedure including the risks, benefits and alternatives for the proposed anesthesia with the patient or authorized representative who has indicated his/her understanding and acceptance.   Dental advisory given  Plan Discussed with:   Anesthesia Plan Comments:        Anesthesia Quick Evaluation

## 2016-04-11 NOTE — Progress Notes (Addendum)
PCP - [name] Cardiologist - Caryl Comes  Chest x-ray - not needed EKG - 03/04/16 Stress Test - 03/05/13 ECHO - 06/18/14 Cardiac Cath - 2010  Sending to anesthesia for heart history No changes since 4/17 Last dose of Eliquis 04/10/16 Has a feeding tube   Patient denies shortness of breath, fever, cough and chest pain at PAT appointment   Patient verbalized understanding of instructions that was given to them at the PAT appointment. Patient expressed that there were no further questions.  Patient was also instructed that they will need to review over the PAT instructions again at home before the surgery.

## 2016-04-11 NOTE — Progress Notes (Signed)
Radiation Oncology         [phone] ________________________________  Name: Renee [name] MRN: [id number]  Date: 04/11/2016  DOB: Aug 09, 1934  Re-Consultation Visit Note  CC: [name], MD  [name], MD  Diagnosis and Prior Radiotherapy:     ICD-9-CM ICD-10-CM   1. 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 The supraglottis and bilateral neck were treated PTV High to 70 Gy in 35 fractions at 2 Gy per fraction.  2. The supraglottis and bilateral neck were treated PTV Med to 63 Gy in 35 fractions at 1.8 Gy per fraction. 3. The supraglottis and bilateral neck were treated PTV Low to 56 Gy in 35 fractions at 1.6 Gy per fraction.   CHIEF COMPLAINT: Re-consultation regarding possible recurrent epiglottic carcinoma  Narrative:  The patient presented to Dr. [name] on 04/03/16 with a complaint of worsening dysphagia and discomfort on the right side of her throat. Laryngoscopy at the time revealed thickening of the right aryepiglottic fold with a slightly exophytic appearance of the tissue. The findings are consistent with persistent or recurrent disease. He plans to evaluate that area more tomorrow with a biopsy, direct laryngoscopy, and esophagoscopy. She continues to follow dentistry. Pain in the posterior right mandible and this only comes when she has throat pain. Dentist stated no concerns. She takes hycet for the pain, and needs refill.  She continues to do her neck exercises. The patient's husband and [name], RN, our Head and Neck Oncology Navigator were present during the encounter.  ALLERGIES:  is allergic to penicillins; statins; budesonide-formoterol fumarate; levofloxacin; morphine; phenazopyridine hcl; pneumococcal vaccines; prednisone; spiriva [tiotropium  bromide monohydrate]; sulfa antibiotics; and sulfonamide derivatives.  Meds: Current Outpatient Prescriptions  Medication Sig Dispense Refill  . HYDROcodone-acetaminophen (HYCET) 7.5-325 mg/15 ml solution Place 10-15 mLs into feeding tube 4 (four) times daily as needed for moderate pain. 1000 mL 0  . lansoprazole (PREVACID SOLUTAB) 30 MG disintegrating tablet Place 30 mg into feeding tube daily.     [name] levothyroxine (SYNTHROID, LEVOTHROID) 50 MCG tablet Take 50 mcg by mouth daily before breakfast.  6  . metoprolol succinate (TOPROL-XL) 25 MG 24 hr tablet 25 mg daily. Per tube    . nitroGLYCERIN (NITROSTAT) 0.4 MG SL tablet Place 0.4 mg under the tongue every 5 (five) minutes x 3 doses as needed for chest pain.     . Nutritional Supplements (FEEDING SUPPLEMENT, OSMOLITE 1.5 CAL,) LIQD Give 1 can of Osmolite 1.5 or equivalent - 5 times daily with 60 cc free water before and after bolus feeding via PEG.  Give additional 240 cc water BID. Send formula and supplies. (Patient taking differently: Take 237 mLs by mouth See admin instructions. Give 1 can of Osmolite 1.5 or equivalent - 4 times daily with 60 cc free water before and after bolus feeding via PEG.  Give additional 240 cc water BID. Send formula and supplies.) 1185 mL 0  . ondansetron (ZOFRAN) 4 MG tablet Take 1 tablet (4 mg total) by mouth every 8 (eight) hours as needed for nausea or vomiting. (Patient taking differently: Place 4 mg into feeding tube every 8 (eight) hours as needed for nausea or vomiting. ) 50 tablet 3  . OXYGEN Inhale 2 L into the lungs as directed. As needed for shortness of breath    .  Polyethyl Glycol-Propyl Glycol (SYSTANE OP) Place 1 drop into both eyes 2 (two) times daily.     . polyethylene glycol (MIRALAX / GLYCOLAX) packet Take 17 g by mouth daily as needed (for constipation.).    [name] sodium fluoride (FLUORISHIELD) 1.1 % GEL dental gel Instill one drop of gel per tooth space of fluoride tray. Place over teeth for 5  minutes. Remove. Spit out excess. Repeat nightly. 120 mL prn  . ALPRAZolam (XANAX) 0.25 MG tablet Place 0.125 mg into feeding tube 2 (two) times daily as needed for anxiety or sleep.     [name] apixaban (ELIQUIS) 5 MG TABS tablet Take 1 tablet (5 mg total) by mouth 2 (two) times daily. (Patient not taking: Reported on 04/11/2016) 60 tablet 1   No current facility-administered medications for this encounter.     Physical Findings: The patient is in no acute distress. Patient is alert and oriented.  height is 5\' 6"  (1.676 m) and weight is 134 lb (60.8 kg). Her temperature is 98.3 F (36.8 C). Her blood pressure is 121/79 and her pulse is 101 (abnormal). Her oxygen saturation is 100%. .  General: Alert and oriented, in no acute distress;   non toxic appearing; hoarse HEENT: Head is normocephalic. Extraocular movements are intact. Mucosa is moist. No evidence of oral lesions. Neck: Neck is notable for healing skin. Mild lymphedema in the anterior neck. No palpable masses in the supraclavicular or subclavian region. Heart: Regular in rate and rhythm, no murmurs. Chest: Lungs are clear bilaterally. Extremities: No swelling or lymphedema noted. Abdomen: PEG site was inspected with no sign of infection or adenopathy at this time Skin: Skin in treatment fields is well healed. Psychiatric: Judgment and insight are intact. Affect is appropriate. Laryngoscopy deferred in light of one to be performed tomorrow.  Lab Findings: Lab Results  Component Value Date   WBC 6.1 04/11/2016   HGB 13.8 04/11/2016   HCT 42.3 04/11/2016   MCV 97.9 04/11/2016   PLT 274 04/11/2016    Lab Results  Component Value Date   TSH 1.98 01/18/2014    Radiographic Findings:images reviewed by me Ct Chest Wo Contrast  Result Date: 03/28/2016 CLINICAL DATA:  History of esophageal cancer. Followup pulmonary opacities identified fine examination from 12/2015. EXAM: CT CHEST WITHOUT CONTRAST TECHNIQUE: Multidetector CT imaging  of the chest was performed following the standard protocol without IV contrast. COMPARISON:  None. FINDINGS: Cardiovascular: The heart size appears normal. There is no pericardial effusion. Aortic atherosclerosis. Previous median sternotomy and CABG procedure. Mediastinum/Nodes: No enlarged mediastinal or axillary lymph nodes. Thyroid gland, trachea, and esophagus demonstrate no significant findings. Lungs/Pleura: There is moderate change at of centrilobular emphysema. Diffuse bronchial wall thickening. Persistent the irregular airspace density within the right upper lobe apex measuring 1.2 cm of image 24 of series 3. Scar like density noted within the left lower lobe. Upper Abdomen: No acute abnormality. Musculoskeletal: No chest wall mass or suspicious bone lesions identified. IMPRESSION: 1. Persistent 1.2 cm nodular density within the right lung apex which showed mild hypermetabolism on previous exam. This is not significantly changed in size from 01/02/2016. Findings may reflect sequelae of prior inflammation or infection. Although stability since 01/02/2016 favors a benign process Metastatic disease or primary lung neoplasm not excluded. Continued interval follow-up is advised to ensure stability of this abnormality. Suggest followup imaging at 6-12 months. 2. Diffuse bronchial wall thickening with emphysema, as above; imaging findings suggestive of underlying COPD. Electronically Signed   By: [name] Moors  M.D.   On: 03/28/2016 14:45    Impression/Plan:   1) Head and Neck Cancer Status: Possible recurrence.  2) Nutritional Status: I discussed with the patient that she should expect to have the PEG indefinitely. Without her epiglottis, the patient may need to continue instilling some to all of her nutrition through the tube.  Weight stable. Continue using PEG prn and follow with SLP  Wt Readings from Last 3 Encounters:  04/11/16 133 lb 6.4 oz (60.5 kg)  04/11/16 134 lb (60.8 kg)  03/08/16 132 lb  9.6 oz (60.1 kg)   3) Risk Factors: The patient has been educated about risk factors including alcohol and tobacco abuse; they understand that avoidance of alcohol and tobacco is important to prevent recurrences as well as other cancers. (abstaining)  4) Swallowing: following with SLP.  Limited function but can swallow some liquids/foods. NO epiglottis.  5) Dental: Encouraged to continue regular followup with dentistry, and dental hygiene including fluoride rinses.    6) Thyroid function: Lab Results  Component Value Date   TSH 1.98 01/18/2014  She had preceding hypothyroidism which will continue to be managed by her PCP.  7) Other: The patient has lymphedema of the neck. I suspect that this is due to post treatment changes rather than nodal recurrence. However, she did have PET positive nodes at diagnosis that were not detectable on physical exam. I would recommend at minimum a CT of the neck with contrast to restage her if she should undergo surgery to her larynx; it would not be unreasonable to get a PET scan as an alternative.  I defer to ENT on this.  8) Right sided neck pain: I refilled her Hycet today.  9) Follow-up with me in 6 months, sooner if needed. I encouraged patient to call me with any concerns or questions that might arise before then. The patient indicates her family may want her to get a 2nd opinion if she needs salvage surgery. She will talk to Dr. [name] about this if indicated. ENT f/u tomorrow for biopsy of the supraglottic, slightly exophytic mass noted by Dr. [name].  Other:  I suspect the lung lesion that is being followed is benign. It is not growing  It could be scarring from radiotherapy or inflammation/infection.  However, I will see if radiology thinks a biopsy is warranted.  If she has cancer in the lung, she states she would think twice about surgery to her larynx.  _____________________________________   Eppie Gibson, MD  This document serves as a record of  services personally performed by Eppie Gibson, MD. It was created on her behalf by Darcus Austin, a trained medical scribe. The creation of this record is based on the scribe's personal observations and the provider's statements to them. This document has been checked and approved by the attending provider.

## 2016-04-12 ENCOUNTER — Ambulatory Visit (HOSPITAL_COMMUNITY): Payer: Medicare Other | Admitting: Certified Registered Nurse Anesthetist

## 2016-04-12 ENCOUNTER — Encounter (HOSPITAL_COMMUNITY)
Admission: RE | Disposition: A | Discharge status: Home / Self Care | Payer: Self-pay | Source: Ambulatory Visit | Attending: Otolaryngology

## 2016-04-12 ENCOUNTER — Ambulatory Visit (HOSPITAL_COMMUNITY)
Admission: RE | Admit: 2016-04-12 | Discharge: 2016-04-12 | Disposition: A | Discharge status: Home / Self Care | Payer: Medicare Other | Source: Ambulatory Visit | Attending: Otolaryngology | Admitting: Otolaryngology

## 2016-04-12 ENCOUNTER — Encounter (HOSPITAL_COMMUNITY): Payer: Self-pay | Admitting: *Deleted

## 2016-04-12 ENCOUNTER — Ambulatory Visit (HOSPITAL_COMMUNITY): Payer: Medicare Other | Admitting: Emergency Medicine

## 2016-04-12 DIAGNOSIS — Z9221 Personal history of antineoplastic chemotherapy: Secondary | ICD-10-CM | POA: Diagnosis not present

## 2016-04-12 DIAGNOSIS — J439 Emphysema, unspecified: Secondary | ICD-10-CM | POA: Insufficient documentation

## 2016-04-12 DIAGNOSIS — Z87891 Personal history of nicotine dependence: Secondary | ICD-10-CM | POA: Insufficient documentation

## 2016-04-12 DIAGNOSIS — Z951 Presence of aortocoronary bypass graft: Secondary | ICD-10-CM | POA: Diagnosis not present

## 2016-04-12 DIAGNOSIS — Z8673 Personal history of transient ischemic attack (TIA), and cerebral infarction without residual deficits: Secondary | ICD-10-CM | POA: Diagnosis not present

## 2016-04-12 DIAGNOSIS — C323 Malignant neoplasm of laryngeal cartilage: Secondary | ICD-10-CM | POA: Diagnosis not present

## 2016-04-12 DIAGNOSIS — Z923 Personal history of irradiation: Secondary | ICD-10-CM | POA: Insufficient documentation

## 2016-04-12 DIAGNOSIS — J449 Chronic obstructive pulmonary disease, unspecified: Secondary | ICD-10-CM | POA: Diagnosis not present

## 2016-04-12 DIAGNOSIS — Z7901 Long term (current) use of anticoagulants: Secondary | ICD-10-CM | POA: Insufficient documentation

## 2016-04-12 DIAGNOSIS — I739 Peripheral vascular disease, unspecified: Secondary | ICD-10-CM | POA: Insufficient documentation

## 2016-04-12 DIAGNOSIS — F419 Anxiety disorder, unspecified: Secondary | ICD-10-CM | POA: Diagnosis not present

## 2016-04-12 DIAGNOSIS — I10 Essential (primary) hypertension: Secondary | ICD-10-CM | POA: Diagnosis not present

## 2016-04-12 DIAGNOSIS — C329 Malignant neoplasm of larynx, unspecified: Secondary | ICD-10-CM | POA: Insufficient documentation

## 2016-04-12 DIAGNOSIS — Z96641 Presence of right artificial hip joint: Secondary | ICD-10-CM | POA: Insufficient documentation

## 2016-04-12 DIAGNOSIS — E039 Hypothyroidism, unspecified: Secondary | ICD-10-CM | POA: Diagnosis not present

## 2016-04-12 DIAGNOSIS — I252 Old myocardial infarction: Secondary | ICD-10-CM | POA: Diagnosis not present

## 2016-04-12 DIAGNOSIS — I340 Nonrheumatic mitral (valve) insufficiency: Secondary | ICD-10-CM | POA: Diagnosis not present

## 2016-04-12 DIAGNOSIS — I480 Paroxysmal atrial fibrillation: Secondary | ICD-10-CM | POA: Insufficient documentation

## 2016-04-12 DIAGNOSIS — Z79899 Other long term (current) drug therapy: Secondary | ICD-10-CM | POA: Insufficient documentation

## 2016-04-12 DIAGNOSIS — Z931 Gastrostomy status: Secondary | ICD-10-CM | POA: Insufficient documentation

## 2016-04-12 DIAGNOSIS — D380 Neoplasm of uncertain behavior of larynx: Secondary | ICD-10-CM | POA: Diagnosis not present

## 2016-04-12 DIAGNOSIS — I2510 Atherosclerotic heart disease of native coronary artery without angina pectoris: Secondary | ICD-10-CM | POA: Diagnosis not present

## 2016-04-12 DIAGNOSIS — K219 Gastro-esophageal reflux disease without esophagitis: Secondary | ICD-10-CM | POA: Diagnosis not present

## 2016-04-12 DIAGNOSIS — R1310 Dysphagia, unspecified: Secondary | ICD-10-CM | POA: Diagnosis not present

## 2016-04-12 DIAGNOSIS — I4510 Unspecified right bundle-branch block: Secondary | ICD-10-CM | POA: Insufficient documentation

## 2016-04-12 DIAGNOSIS — J387 Other diseases of larynx: Secondary | ICD-10-CM | POA: Diagnosis not present

## 2016-04-12 DIAGNOSIS — Z9981 Dependence on supplemental oxygen: Secondary | ICD-10-CM | POA: Insufficient documentation

## 2016-04-12 DIAGNOSIS — I5032 Chronic diastolic (congestive) heart failure: Secondary | ICD-10-CM | POA: Diagnosis not present

## 2016-04-12 HISTORY — PX: ESOPHAGOSCOPY: SHX5534

## 2016-04-12 HISTORY — PX: DIRECT LARYNGOSCOPY: SHX5326

## 2016-04-12 SURGERY — LARYNGOSCOPY, DIRECT
Anesthesia: General | Laterality: Right

## 2016-04-12 MED ORDER — PROPOFOL 10 MG/ML IV BOLUS
INTRAVENOUS | Status: AC
  Filled 2016-04-12: qty 20

## 2016-04-12 MED ORDER — LIDOCAINE 2% (20 MG/ML) 5 ML SYRINGE
INTRAMUSCULAR | Status: AC
  Filled 2016-04-12: qty 5

## 2016-04-12 MED ORDER — PROPOFOL 10 MG/ML IV BOLUS
INTRAVENOUS | Status: DC | PRN
  Administered 2016-04-12: 120 mg via INTRAVENOUS

## 2016-04-12 MED ORDER — FENTANYL CITRATE (PF) 100 MCG/2ML IJ SOLN
INTRAMUSCULAR | Status: AC
  Filled 2016-04-12: qty 2

## 2016-04-12 MED ORDER — EPINEPHRINE PF 1 MG/ML IJ SOLN
INTRAMUSCULAR | Status: AC
  Filled 2016-04-12: qty 1

## 2016-04-12 MED ORDER — FENTANYL CITRATE (PF) 100 MCG/2ML IJ SOLN
INTRAMUSCULAR | Status: DC | PRN
  Administered 2016-04-12 (×2): 25 ug via INTRAVENOUS
  Administered 2016-04-12: 50 ug via INTRAVENOUS
  Administered 2016-04-12 (×2): 25 ug via INTRAVENOUS
  Administered 2016-04-12: 50 ug via INTRAVENOUS

## 2016-04-12 MED ORDER — ONDANSETRON HCL 4 MG/2ML IJ SOLN
INTRAMUSCULAR | Status: DC | PRN
  Administered 2016-04-12: 4 mg via INTRAVENOUS

## 2016-04-12 MED ORDER — LIDOCAINE HCL (CARDIAC) 20 MG/ML IV SOLN
INTRAVENOUS | Status: DC | PRN
  Administered 2016-04-12: 60 mg via INTRAVENOUS

## 2016-04-12 MED ORDER — FENTANYL CITRATE (PF) 100 MCG/2ML IJ SOLN
INTRAMUSCULAR | Status: AC
  Filled 2016-04-12: qty 4

## 2016-04-12 MED ORDER — SUCCINYLCHOLINE CHLORIDE 200 MG/10ML IV SOSY
PREFILLED_SYRINGE | INTRAVENOUS | Status: AC
  Filled 2016-04-12: qty 10

## 2016-04-12 MED ORDER — LACTATED RINGERS IV SOLN
INTRAVENOUS | Status: DC | PRN
  Administered 2016-04-12: 07:00:00 via INTRAVENOUS

## 2016-04-12 MED ORDER — PHENYLEPHRINE 40 MCG/ML (10ML) SYRINGE FOR IV PUSH (FOR BLOOD PRESSURE SUPPORT)
PREFILLED_SYRINGE | INTRAVENOUS | Status: AC
  Filled 2016-04-12: qty 10

## 2016-04-12 MED ORDER — EPINEPHRINE HCL (NASAL) 0.1 % NA SOLN
NASAL | Status: DC | PRN
  Administered 2016-04-12: 1 [drp] via TOPICAL

## 2016-04-12 MED ORDER — SUCCINYLCHOLINE CHLORIDE 200 MG/10ML IV SOSY
PREFILLED_SYRINGE | INTRAVENOUS | Status: DC | PRN
  Administered 2016-04-12: 100 mg via INTRAVENOUS

## 2016-04-12 MED ORDER — SUGAMMADEX SODIUM 200 MG/2ML IV SOLN
INTRAVENOUS | Status: DC | PRN
  Administered 2016-04-12: 120 mg via INTRAVENOUS

## 2016-04-12 MED ORDER — SUGAMMADEX SODIUM 200 MG/2ML IV SOLN
INTRAVENOUS | Status: AC
  Filled 2016-04-12: qty 2

## 2016-04-12 MED ORDER — FENTANYL CITRATE (PF) 100 MCG/2ML IJ SOLN
25.0000 ug | INTRAMUSCULAR | Status: DC | PRN
  Administered 2016-04-12 (×2): 50 ug via INTRAVENOUS

## 2016-04-12 MED ORDER — ROCURONIUM BROMIDE 50 MG/5ML IV SOSY
PREFILLED_SYRINGE | INTRAVENOUS | Status: AC
  Filled 2016-04-12: qty 5

## 2016-04-12 MED ORDER — ONDANSETRON HCL 4 MG/2ML IJ SOLN
INTRAMUSCULAR | Status: AC
  Filled 2016-04-12: qty 2

## 2016-04-12 MED ORDER — EPINEPHRINE HCL (NASAL) 0.1 % NA SOLN
NASAL | Status: AC
  Filled 2016-04-12: qty 30

## 2016-04-12 MED ORDER — PROMETHAZINE HCL 25 MG/ML IJ SOLN: 6.2500 mg | INTRAMUSCULAR | Status: DC | PRN

## 2016-04-12 MED ORDER — DEXAMETHASONE SODIUM PHOSPHATE 10 MG/ML IJ SOLN
INTRAMUSCULAR | Status: AC
  Filled 2016-04-12: qty 1

## 2016-04-12 MED ORDER — OXYMETAZOLINE HCL 0.05 % NA SOLN
NASAL | Status: AC
  Filled 2016-04-12: qty 15

## 2016-04-12 MED ORDER — DEXAMETHASONE SODIUM PHOSPHATE 10 MG/ML IJ SOLN
INTRAMUSCULAR | Status: DC | PRN
  Administered 2016-04-12: 10 mg via INTRAVENOUS

## 2016-04-12 MED ORDER — ROCURONIUM BROMIDE 100 MG/10ML IV SOLN
INTRAVENOUS | Status: DC | PRN
  Administered 2016-04-12: 30 mg via INTRAVENOUS

## 2016-04-12 SURGICAL SUPPLY — 20 items
CANISTER SUCT 3000ML PPV (MISCELLANEOUS) ×4 IMPLANT
COVER BACK TABLE 60X90IN (DRAPES) ×4 IMPLANT
COVER MAYO STAND STRL (DRAPES) ×4 IMPLANT
CRADLE DONUT ADULT HEAD (MISCELLANEOUS) IMPLANT
DRAPE PROXIMA HALF (DRAPES) ×4 IMPLANT
GAUZE SPONGE 4X4 16PLY XRAY LF (GAUZE/BANDAGES/DRESSINGS) IMPLANT
GLOVE ECLIPSE 7.5 STRL STRAW (GLOVE) ×4 IMPLANT
GUARD TEETH (MISCELLANEOUS) IMPLANT
KIT BASIN OR (CUSTOM PROCEDURE TRAY) ×4 IMPLANT
KIT ROOM TURNOVER OR (KITS) ×4 IMPLANT
NEEDLE HYPO 25GX1X1/2 BEV (NEEDLE) IMPLANT
NS IRRIG 1000ML POUR BTL (IV SOLUTION) ×4 IMPLANT
PAD ARMBOARD 7.5X6 YLW CONV (MISCELLANEOUS) ×8 IMPLANT
PATTIES SURGICAL .5 X3 (DISPOSABLE) IMPLANT
SOLUTION ANTI FOG 6CC (MISCELLANEOUS) IMPLANT
SPECIMEN JAR SMALL (MISCELLANEOUS) IMPLANT
SURGILUBE 2OZ TUBE FLIPTOP (MISCELLANEOUS) IMPLANT
TOWEL OR 17X24 6PK STRL BLUE (TOWEL DISPOSABLE) ×8 IMPLANT
TUBE CONNECTING 12'X1/4 (SUCTIONS) ×1
TUBE CONNECTING 12X1/4 (SUCTIONS) ×3 IMPLANT

## 2016-04-12 NOTE — Op Note (Signed)
Preop/postop diagnosis: Laryngeal mass Procedure: Direct laryngoscopy and esophagoscopy with biopsy Anesthesia: Gen. Estimated blood loss: Less than 10 mL Indications: 82 year old who's previously had a squamous cell carcinoma of her larynx and treated with radiation and chemotherapy. She is still using the PEG tube it over the last few weeks she has progressively had more dysphagia to any items. She's having some discomfort as well. Fiberoptic exam in the office indicated the possibility of a exophytic and ulcerative area on the right larynx. She was informed risk and benefits of the procedure and options were discussed all questions are answered and consent was obtained. Procedure: Patient was taken to the operating room placed in the supine position after general endotracheal tube anesthesia with a #6 tube the Dedo scope was inserted and the larynx was evaluated. The right piriform was not really patent as the scar had closed it but the lining looks normal.  Left piriform is clear. The arytenoids on the right side has exophytic tissue that does extend posteriorly into the post cricoid area but only about less than a centimeter distally. This tissue extends up on the arytenoids toward the area epiglottic fold. There is also an ulcerative area on the right larynx basically where the previous epiglottis was located. Both of these areas were biopsied. Adrenaline-soaked pledgets were used to gain hemostasis. The vocal cords appeared denies have any obvious involvement or lesions. The esophagoscope was then inserted after removing the Dedo scope. The esophagus was very narrow and the esophagoscope was not passed through this narrow opening but the post cricoid cervical esophageal area to the cricopharyngeus muscle was without any evidence of mass or lesion. The mucosa looks smooth and normal. It does have scar bands laterally that looked smooth. The pledgets were then removed. The larynx had good hemostasis. The  patient was awakened brought to recovery in stable condition counts correct

## 2016-04-12 NOTE — Anesthesia Postprocedure Evaluation (Signed)
Anesthesia Post Note  Patient: LEELA VANBROCKLIN  Procedure(s) Performed: Procedure(s) (LRB): DIRECT LARYNGOSCOPY (Right) ESOPHAGOSCOPY (N/A)  Patient location during evaluation: PACU Anesthesia Type: General Level of consciousness: awake and alert Pain management: pain level controlled Vital Signs Assessment: post-procedure vital signs reviewed and stable Respiratory status: spontaneous breathing, nonlabored ventilation, respiratory function stable and patient connected to nasal cannula oxygen Cardiovascular status: blood pressure returned to baseline and stable Postop Assessment: no signs of nausea or vomiting Anesthetic complications: no       Last Vitals:  Vitals:   04/12/16 0940 04/12/16 1000  BP: (!) 148/67 140/71  Pulse: 93 91  Resp: 16 15  Temp: 36.6 C 36.1 C    Last Pain:  Vitals:   04/12/16 0945  TempSrc:   PainSc: 2                  Tiajuana Amass

## 2016-04-12 NOTE — Transfer of Care (Signed)
Immediate Anesthesia Transfer of Care Note  Patient: Renee [name]  Procedure(s) Performed: Procedure(s): DIRECT LARYNGOSCOPY (Right) ESOPHAGOSCOPY (N/A)  Patient Location: PACU  Anesthesia Type:General  Level of Consciousness: awake, alert  and oriented  Airway & Oxygen Therapy: Patient Spontanous Breathing  Post-op Assessment: Report given to RN and Post -op Vital signs reviewed and stable  Post vital signs: Reviewed and stable  Last Vitals:  Vitals:   04/12/16 0559  BP: (!) 142/53  Pulse: 82  Resp: 18  Temp: 36.8 C    Last Pain:  Vitals:   04/12/16 0603  TempSrc:   PainSc: 5       Patients Stated Pain Goal: 3 (67/[id number]/73 [id number])  Complications: No apparent anesthesia complications

## 2016-04-12 NOTE — Progress Notes (Signed)
Report given to robin roberts rn as caregiver 

## 2016-04-12 NOTE — Anesthesia Procedure Notes (Signed)
Procedure Name: Intubation Date/Time: 04/12/2016 7:42 AM Performed by: Candis Shine Pre-anesthesia Checklist: Patient identified, Emergency Drugs available, Suction available and Patient being monitored Patient Re-evaluated:Patient Re-evaluated prior to inductionOxygen Delivery Method: Circle System Utilized Preoxygenation: Pre-oxygenation with 100% oxygen Intubation Type: IV induction Ventilation: Mask ventilation without difficulty and Oral airway inserted - appropriate to patient size Laryngoscope Size: Glidescope and 3 Grade View: Grade II Tube type: Oral Tube size: 6.0 mm Number of attempts: 2 Airway Equipment and Method: Stylet and Oral airway Placement Confirmation: ETT inserted through vocal cords under direct vision,  positive ETCO2 and breath sounds checked- equal and bilateral Secured at: 22 cm Tube secured with: Tape Dental Injury: Teeth and Oropharynx as per pre-operative assessment  Difficulty Due To: Difficulty was anticipated and Difficult Airway- due to limited oral opening Future Recommendations: Recommend- induction with short-acting agent, and alternative techniques readily available

## 2016-04-12 NOTE — H&P (Signed)
Renee [name] is an 82 y.o. female.   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   Past Medical History:  Diagnosis Date  . Anxiety    takes Xanax as needed  . Arthritis   . Brain aneurysm   . Cancer (HCC)    epiglottis (throat)   . Chronic back pain    buldging disc  . Chronic bronchitis ([place])   . Chronic diastolic CHF (congestive heart failure) (HCC)    was on Lasix but has been off for a few months  . Chronic respiratory failure ([place])   . Coronary artery disease    a. CABG 09/2009. b. normal myoview in 05/2012.   [name] Dysphagia   . Emphysema lung ([place])   . Emphysema/COPD ([place])    on home O2  . GERD (gastroesophageal reflux disease)    takes Nexium daily  . History of colon polyps    benign  . History of radiation therapy 07/19/15- 09/08/15   Supraglottis and bilateral neck.   . History of shingles   . History of vertigo    doesn't take any meds  . HLD (hyperlipidemia)    can't take meds d/t joint aches/pains  . Hypertension   . Hypothyroidism    takes Synthroid daily  . IBS (irritable bowel syndrome)   . Internal hemorrhoids   . Macular degeneration    dry  . Myocardial infarction 2009   takes Eliquis daily.  . On supplemental oxygen therapy    at night  . Orthostatic hypotension   . Osteoporosis   . PAF (paroxysmal atrial fibrillation) ([place])    a. Post op after CABG. Intolerant of amio in the past. b. Recurrence - started on Tikosyn 11/2013.  [name] Pneumonia    several yrs ago  . PONV (postoperative nausea and vomiting)   . RBBB    takes Metoporolol daily  . Sciatic nerve pain     Past Surgical History:  Procedure Laterality Date  . APPENDECTOMY  1995  . ATRIAL FIBRILLATION ABLATION N/A 05/11/2014   Procedure: ATRIAL FIBRILLATION ABLATION;  Surgeon: Thompson Grayer, MD;  Location: [facility] CATH LAB;  Service: Cardiovascular;  Laterality: N/A;  . CARDIAC CATHETERIZATION  1990's; 06/2009  . CATARACT EXTRACTION W/ INTRAOCULAR  LENS  IMPLANT, BILATERAL Bilateral   . CHOLECYSTECTOMY  2008  . colonosocpy    . CORONARY ARTERY BYPASS GRAFT  09/21/2009   CABG X2  . DIRECT LARYNGOSCOPY N/A 06/03/2015   Procedure: Direct laryngoscopy with biopsy left epiglottic lesion;  Surgeon: [name], MD;  Location: [place];  Service: ENT;  Laterality: N/A;  . ESOPHAGOGASTRODUODENOSCOPY    . IR GENERIC HISTORICAL  11/15/2015   IR RADIOLOGIST EVAL & MGMT 11/15/2015 Saverio Danker, PA-C [facility]-INTERV RAD  . JOINT REPLACEMENT    . LARYNX SURGERY  ~ 1960   "tumor removed"  . RETINAL LASER PROCEDURE Left 10/19/2013   "had a wrinkle in it"  . TEE WITHOUT CARDIOVERSION N/A 05/10/2014   Procedure: TRANSESOPHAGEAL ECHOCARDIOGRAM (TEE);  Surgeon: Sueanne Margarita, MD;  Location: [facility] ENDOSCOPY;  Service: Cardiovascular;  Laterality: N/A;  . TONSILLECTOMY AND ADENOIDECTOMY    . TOTAL ABDOMINAL HYSTERECTOMY  1986  . TOTAL HIP ARTHROPLASTY Right 11/01/2008  . TUBAL LIGATION  1973    Family History  Problem Relation Age of Onset  . Heart disease Mother   . Transient ischemic attack Mother   . Hypertension Mother   . Deep vein thrombosis Mother   .  Heart attack Father   . Hypertension Father   . Diabetes Father   . Hyperlipidemia Father   . Breast cancer Sister   . Myasthenia gravis Brother   . Brain cancer Sister     mets from breast  . Colon polyps Brother   . Hypertension Brother   . Colon cancer Daughter   . Colon cancer Other     nephew   Social History:  reports that she quit smoking about 7 years ago. Her smoking use included Cigarettes. She has a 40.00 pack-year smoking history. She has never used smokeless tobacco. She reports that she does not drink alcohol or use drugs.  Allergies:  Allergies  Allergen Reactions  . Penicillins Swelling and Rash    SWELLING REACTION UNSPECIFIED   Has patient had a PCN reaction causing immediate rash, facial/tongue/throat swelling, SOB or lightheadedness with hypotension: #  #  #  YES  #  #  #   Has patient had a PCN reaction causing severe rash involving mucus membranes or skin necrosis: No Has patient had a PCN reaction that required hospitalization Unk Has patient had a PCN reaction occurring within the last 10 years: Unk If all of the above answers are "NO", then may proceed with Cephalosporin use.   . Statins Other (See Comments)    Myalgias  . Budesonide-Formoterol Fumarate Other (See Comments)    Tachycardia  . Levofloxacin Nausea Only    Unable to tolerate more than 7 days  . Morphine Other (See Comments)    double vision  . Phenazopyridine Hcl Other (See Comments)    *PYRIDIUM RED FACE*   . Pneumococcal Vaccines Swelling    [place] of the skin  . Prednisone Swelling    Can't take PO but injectable is fine  . Spiriva [Tiotropium Bromide Monohydrate] Other (See Comments)    Throat irritation  . Sulfa Antibiotics Rash  . Sulfonamide Derivatives Rash    Medications Prior to Admission  Medication Sig Dispense Refill  . ALPRAZolam (XANAX) 0.25 MG tablet Place 0.125 mg into feeding tube 2 (two) times daily as needed for anxiety or sleep.     [name] apixaban (ELIQUIS) 5 MG TABS tablet Take 1 tablet (5 mg total) by mouth 2 (two) times daily. (Patient not taking: Reported on 04/11/2016) 60 tablet 1  . HYDROcodone-acetaminophen (HYCET) 7.5-325 mg/15 ml solution Place 10-15 mLs into feeding tube 4 (four) times daily as needed for moderate pain. 1000 mL 0  . lansoprazole (PREVACID SOLUTAB) 30 MG disintegrating tablet Place 30 mg into feeding tube daily.     [name] levothyroxine (SYNTHROID, LEVOTHROID) 50 MCG tablet Take 50 mcg by mouth daily before breakfast.  6  . metoprolol succinate (TOPROL-XL) 25 MG 24 hr tablet 25 mg daily. Per tube    . nitroGLYCERIN (NITROSTAT) 0.4 MG SL tablet Place 0.4 mg under the tongue every 5 (five) minutes x 3 doses as needed for chest pain.     . Nutritional Supplements (FEEDING SUPPLEMENT, OSMOLITE 1.5 CAL,) LIQD Give 1 can of Osmolite 1.5 or equivalent -  5 times daily with 60 cc free water before and after bolus feeding via PEG.  Give additional 240 cc water BID. Send formula and supplies. (Patient taking differently: Take 237 mLs by mouth See admin instructions. Give 1 can of Osmolite 1.5 or equivalent - 4 times daily with 60 cc free water before and after bolus feeding via PEG.  Give additional 240 cc water BID. Send formula and supplies.) 1185 mL  0  . ondansetron (ZOFRAN) 4 MG tablet Take 1 tablet (4 mg total) by mouth every 8 (eight) hours as needed for nausea or vomiting. (Patient taking differently: Place 4 mg into feeding tube every 8 (eight) hours as needed for nausea or vomiting. ) 50 tablet 3  . OXYGEN Inhale 2 L into the lungs as directed. As needed for shortness of breath    . Polyethyl Glycol-Propyl Glycol (SYSTANE OP) Place 1 drop into both eyes 2 (two) times daily.     . polyethylene glycol (MIRALAX / GLYCOLAX) packet Take 17 g by mouth daily as needed (for constipation.).    [name] sodium fluoride (FLUORISHIELD) 1.1 % GEL dental gel Instill one drop of gel per tooth space of fluoride tray. Place over teeth for 5 minutes. Remove. Spit out excess. Repeat nightly. 120 mL prn    Results for orders placed or performed during the hospital encounter of 04/11/16 (from the past 48 hour(s))  Basic metabolic panel     Status: Abnormal   Collection Time: 04/11/16  1:30 PM  Result Value Ref Range   Sodium 136 135 - 145 mmol/L   Potassium 4.1 3.5 - 5.1 mmol/L   Chloride 98 (L) 101 - 111 mmol/L   CO2 28 22 - 32 mmol/L   Glucose, Bld 128 (H) 65 - 99 mg/dL   BUN 25 (H) 6 - 20 mg/dL   Creatinine, Ser 0.77 0.44 - 1.00 mg/dL   Calcium 9.8 8.9 - 10.3 mg/dL   GFR calc non Af Amer >60 >60 mL/min   GFR calc Af Amer >60 >60 mL/min    Comment: (NOTE) The eGFR has been calculated using the CKD EPI equation. This calculation has not been validated in all clinical situations. eGFR's persistently <60 mL/min signify possible Chronic Kidney Disease.    Anion  gap 10 5 - 15  CBC     Status: None   Collection Time: 04/11/16  1:30 PM  Result Value Ref Range   WBC 6.1 4.0 - 10.5 K/uL   RBC 4.32 3.87 - 5.11 MIL/uL   Hemoglobin 13.8 12.0 - 15.0 g/dL   HCT 42.3 36.0 - 46.0 %   MCV 97.9 78.0 - 100.0 fL   MCH 31.9 26.0 - 34.0 pg   MCHC 32.6 30.0 - 36.0 g/dL   RDW 13.7 11.5 - 15.5 %   Platelets 274 150 - 400 K/uL  PT- INR at PAT visit (Pre-admission Testing)     Status: Abnormal   Collection Time: 04/11/16  1:30 PM  Result Value Ref Range   Prothrombin Time 15.4 (H) 11.4 - 15.2 seconds   INR 1.21    No results found.  Review of Systems  Constitutional: Negative.   HENT: Negative.   Eyes: Negative.   Respiratory: Negative.   Cardiovascular: Negative.   Skin: Negative.     Blood pressure (!) 142/53, pulse 82, temperature 98.2 F (36.8 C), temperature source Oral, resp. rate 18, SpO2 100 %. Physical Exam  Constitutional: She appears well-developed and well-nourished.  HENT:  Head: Normocephalic.  Nose: Nose normal.  Mouth/Throat: Oropharynx is clear and moist.  Eyes: Conjunctivae are normal. Pupils are equal, round, and reactive to light.  Neck: Normal range of motion. Neck supple.  Cardiovascular: Normal rate.   Respiratory: Effort normal.  GI: Soft.  Musculoskeletal: Normal range of motion.     Assessment/Plan Laryngeal mass- discussed microDl and esophagoscopy and ready top proceed.  [name], MD 04/12/2016, 7:15 AM

## 2016-04-13 ENCOUNTER — Encounter (HOSPITAL_COMMUNITY): Payer: Self-pay | Admitting: Otolaryngology

## 2016-04-20 ENCOUNTER — Ambulatory Visit: Payer: Medicare Other | Admitting: Radiation Oncology

## 2016-04-25 ENCOUNTER — Telehealth: Payer: Self-pay | Admitting: Radiation Oncology

## 2016-04-25 NOTE — Telephone Encounter (Signed)
Called patient to let her know I discussed her Chest CT with radiology. Level of Suspicion is low for the apical lung lesion.  He does not recommend biopsy. She appreciated the call.  Will see Dr. Vicie Mutters at [facility] next week as her laryngeal biopsy was positive.  -----------------------------------  Eppie Gibson, MD

## 2016-04-27 ENCOUNTER — Telehealth: Payer: Self-pay | Admitting: *Deleted

## 2016-04-27 ENCOUNTER — Other Ambulatory Visit: Payer: Self-pay | Admitting: Radiation Oncology

## 2016-04-27 DIAGNOSIS — C321 Malignant neoplasm of supraglottis: Secondary | ICD-10-CM

## 2016-04-27 MED ORDER — HYDROCODONE-ACETAMINOPHEN 7.5-325 MG/15ML PO SOLN: 10.0000 mL | Freq: Four times a day (QID) | ORAL | 0 refills | Status: DC | PRN

## 2016-04-27 NOTE — Telephone Encounter (Signed)
Oncology Nurse Navigator Documentation  Received call from patient husband requesting refill for HYDROcodone-acetaminophen (HYCET) 7.5-325 mg/15 ml. She is using 15 mls QID for sharp throat pain rated 5/10, obtains relief. She has enough to last into the weekend.  Dr. [name] informed.  [name], RN, BSN, [place] Neck Oncology Nurse [place] at [place] [phone]

## 2016-04-27 NOTE — Telephone Encounter (Signed)
Oncology Nurse Navigator Documentation  Spoke with patient, informed Rx available for pick-up.  Instructed husband should come to Radiation Oncology Nursing.  [name], RN, BSN, [place] Neck Oncology Nurse [place] at [place] [phone]

## 2016-05-02 DIAGNOSIS — Z87891 Personal history of nicotine dependence: Secondary | ICD-10-CM | POA: Diagnosis not present

## 2016-05-02 DIAGNOSIS — I10 Essential (primary) hypertension: Secondary | ICD-10-CM | POA: Diagnosis not present

## 2016-05-02 DIAGNOSIS — Z923 Personal history of irradiation: Secondary | ICD-10-CM | POA: Diagnosis not present

## 2016-05-02 DIAGNOSIS — J449 Chronic obstructive pulmonary disease, unspecified: Secondary | ICD-10-CM | POA: Diagnosis not present

## 2016-05-02 DIAGNOSIS — I2510 Atherosclerotic heart disease of native coronary artery without angina pectoris: Secondary | ICD-10-CM | POA: Diagnosis not present

## 2016-05-02 DIAGNOSIS — C321 Malignant neoplasm of supraglottis: Secondary | ICD-10-CM | POA: Diagnosis not present

## 2016-05-02 DIAGNOSIS — R911 Solitary pulmonary nodule: Secondary | ICD-10-CM | POA: Diagnosis not present

## 2016-05-02 DIAGNOSIS — Z951 Presence of aortocoronary bypass graft: Secondary | ICD-10-CM | POA: Diagnosis not present

## 2016-05-07 DIAGNOSIS — C329 Malignant neoplasm of larynx, unspecified: Secondary | ICD-10-CM | POA: Diagnosis not present

## 2016-05-14 DIAGNOSIS — C329 Malignant neoplasm of larynx, unspecified: Secondary | ICD-10-CM | POA: Diagnosis not present

## 2016-05-14 DIAGNOSIS — C321 Malignant neoplasm of supraglottis: Secondary | ICD-10-CM | POA: Diagnosis not present

## 2016-05-14 DIAGNOSIS — R490 Dysphonia: Secondary | ICD-10-CM | POA: Diagnosis not present

## 2016-05-18 ENCOUNTER — Encounter: Payer: Self-pay | Admitting: *Deleted

## 2016-05-21 NOTE — Progress Notes (Signed)
Oncology Nurse Navigator Documentation  In follow-up to 4/12 call from patient's husband re torn cap on PEG syringe adapter and need of replacement and discussion with Lennette Bihari and Tye Maryland, [facility] IR, met with Ms. Wisnewski and her husband in [place], replaced old adapter with new one.  Ms Hedstrom shared her concerns/misgivings re whether or not to undergo total laryngectomy, noted she has 2 weeks to decide.  I acknowledged her thoughts, indicated I would try to find a patient mentor for her.  She expressed appreciation.  [name], RN, BSN, [place] Neck Oncology Nurse [place] at [place] [phone]

## 2016-05-25 ENCOUNTER — Other Ambulatory Visit: Payer: Self-pay | Admitting: Radiation Oncology

## 2016-05-25 DIAGNOSIS — C321 Malignant neoplasm of supraglottis: Secondary | ICD-10-CM

## 2016-05-25 MED ORDER — HYDROCODONE-ACETAMINOPHEN 7.5-325 MG/15ML PO SOLN: 10.0000 mL | Freq: Four times a day (QID) | ORAL | 0 refills | Status: AC | PRN

## 2016-06-05 ENCOUNTER — Ambulatory Visit: Payer: Self-pay | Admitting: Internal Medicine

## 2016-06-12 ENCOUNTER — Telehealth: Payer: Self-pay | Admitting: *Deleted

## 2016-06-12 DIAGNOSIS — C329 Malignant neoplasm of larynx, unspecified: Secondary | ICD-10-CM | POA: Diagnosis not present

## 2016-06-12 DIAGNOSIS — Z951 Presence of aortocoronary bypass graft: Secondary | ICD-10-CM | POA: Diagnosis not present

## 2016-06-12 DIAGNOSIS — Z87891 Personal history of nicotine dependence: Secondary | ICD-10-CM | POA: Diagnosis not present

## 2016-06-12 DIAGNOSIS — C322 Malignant neoplasm of subglottis: Secondary | ICD-10-CM | POA: Diagnosis not present

## 2016-06-12 DIAGNOSIS — R918 Other nonspecific abnormal finding of lung field: Secondary | ICD-10-CM | POA: Diagnosis not present

## 2016-06-12 DIAGNOSIS — I4891 Unspecified atrial fibrillation: Secondary | ICD-10-CM | POA: Diagnosis not present

## 2016-06-12 DIAGNOSIS — R52 Pain, unspecified: Secondary | ICD-10-CM | POA: Diagnosis not present

## 2016-06-12 DIAGNOSIS — R911 Solitary pulmonary nodule: Secondary | ICD-10-CM | POA: Diagnosis not present

## 2016-06-12 DIAGNOSIS — Z7901 Long term (current) use of anticoagulants: Secondary | ICD-10-CM | POA: Diagnosis not present

## 2016-06-12 DIAGNOSIS — Z931 Gastrostomy status: Secondary | ICD-10-CM | POA: Diagnosis not present

## 2016-06-13 ENCOUNTER — Other Ambulatory Visit: Payer: Self-pay | Admitting: *Deleted

## 2016-06-13 ENCOUNTER — Telehealth: Payer: Self-pay | Admitting: *Deleted

## 2016-06-13 DIAGNOSIS — C321 Malignant neoplasm of supraglottis: Secondary | ICD-10-CM

## 2016-06-13 NOTE — Telephone Encounter (Signed)
Oncology Nurse Navigator Documentation  Returned call from Renee [name]'s daughter, Renee [name].   She indicated her mother has decided not to have total laryngectomy (Dr. Vicie Mutters, [facility]), met yesterday with Dr. Melven Sartorius, [facility], to discuss option of additional RT with chemotherapy options. [name] would like to schedule appts with Drs. [name] and Alvy Bimler to discuss treatment at [facility].  [name], RN, BSN, [place] Neck Oncology Nurse [place] at [place] [phone]

## 2016-06-13 NOTE — Telephone Encounter (Signed)
Order placed for restaging PET to be completed by 5/16, requesting appt with SS in conjunction with 5/16 NG, placing on TB for next week, [facility] digitized imaging requested through [facility] Radiology.

## 2016-06-13 NOTE — Telephone Encounter (Signed)
I will see her next week 5/16 at 1 pm I will send msg to Seth Bake Please make sure her case is presented at ENT TB

## 2016-06-15 ENCOUNTER — Encounter (HOSPITAL_COMMUNITY)
Admission: RE | Admit: 2016-06-15 | Discharge: 2016-06-15 | Disposition: A | Discharge status: Home / Self Care | Payer: Medicare Other | Source: Ambulatory Visit | Attending: Radiation Oncology | Admitting: Radiation Oncology

## 2016-06-15 ENCOUNTER — Encounter: Payer: Self-pay | Admitting: Radiation Oncology

## 2016-06-15 DIAGNOSIS — R911 Solitary pulmonary nodule: Secondary | ICD-10-CM | POA: Diagnosis not present

## 2016-06-15 DIAGNOSIS — C321 Malignant neoplasm of supraglottis: Secondary | ICD-10-CM | POA: Insufficient documentation

## 2016-06-15 LAB — GLUCOSE, CAPILLARY: GLUCOSE-CAPILLARY: 121 mg/dL — AB (ref 65–99)

## 2016-06-15 MED ORDER — FLUDEOXYGLUCOSE F - 18 (FDG) INJECTION
6.6300 | Freq: Once | INTRAVENOUS | Status: AC | PRN
  Administered 2016-06-15: 6.63 via INTRAVENOUS

## 2016-06-15 NOTE — Progress Notes (Signed)
Head and Neck Cancer Location of Tumor / Histology:  04/12/16 Diagnosis 1. Larynx, biopsy, Right INFILTRATIVE SQUAMOUS CELL CARCINOMA 2. Larynx, biopsy, Right Arytenoid INVASIVE SQUAMOUS CELL CARCINOMA  Patient presented months ago with symptoms of: She has been followed closely after radiation for Larynx squamous cell carcinoma. She had a recent positive biopsy on 04/12/16.  Biopsies of Right Larynx revealed: Infiltrative squamous cell carcinoma  Nutrition Status Yes No Comments  Weight changes? []  [x]    Swallowing concerns? [x]  []  She is not swallowing anything orally. She reports pain in her throat and aspiration concerns.   PEG? [x]  []  She is instilling  4 osmolite cartons daily. She is unable to tolerate anymore osmolite because it "comes back up"   Referrals Yes No Comments  Social Work? []  [x]    Dentistry? []  [x]    Swallowing therapy? []  [x]    Nutrition? []  [x]    Med/Onc? [x]  []  Dr. Alvy Bimler today at 1 pm   Safety Issues Yes No Comments  Prior radiation? [x]  []  Radiation treatment dates:  07/19/2015-09/08/2015  Site/dose:    1. The supraglottis and bilateral neck were treated PTV High to 70 Gy in 35 fractions at 2 Gy per fraction.  2. The supraglottis and bilateral neck were treated PTV Med to 63 Gy in 35 fractions at 1.8 Gy per fraction. 3. The supraglottis and bilateral neck were treated PTV Low to 56 Gy in 35 fractions at 1.6 Gy per fraction.  Pacemaker/ICD? []  [x]    Possible current pregnancy? []  [x]    Is the patient on methotrexate? []  [x]     Tobacco/Marijuana/Snuff/ETOH use: She is a former smoker quitting in 2010. She does not drink alcohol.  Past/Anticipated interventions by otolaryngology, if any:  [facility] saw 05/02/16 and documents: Treatment options including associated side effects were discussed. Options include observation (progession of problem being treated with death as outcome ), chemotherapy (not a curative option) or surgical intervention. If  imaging suggests resectability, I recommend patient proceed with total laryngectomy and pectoralis Flap for anterior pharyngeal reconstruction. Associated side effects include bleeding, infection, numbness in surgical area, wound healing issues. I explained pertinent surgical protocol including where the incision will be; I discussed pre op and post op expectations. I kindly answered all of her and her family's questions to their satisfaction. At the end of our discussion, Ms. Yim voiced that she would like to consider her options, discuss with family and be in communication with me . We will assess after scan.     --Per EPIC notes she has decided against surgery and plans to see Dr. [name] and Dr. Alvy Bimler in [place] to discuss her options.    Past/Anticipated interventions by medical oncology, if any:  She has pain at biopsy site from 04/12/16. She is taking hycet for pain as prescribed. She also reports coughing up bright red blood which was present before the biopsy but has increased since her biopsy 04/12/16 She is using a compression device for neck edema, and tells me that her pain increases after using this. Her voice is hoarse at times after using the device.   06/12/16 [facility]  Dr. Alvy Bimler 06/20/16 at 1pm   Current Complaints / other details:    BP 130/81   Pulse 80   Ht 5\' 6"  (1.676 m)   Wt 135 lb 6.4 oz (61.4 kg)   SpO2 95%   BMI 21.85 kg/m    Wt Readings from Last 3 Encounters:  06/20/16 135 lb 6.4 oz (61.4  kg)  04/11/16 133 lb 6.4 oz (60.5 kg)  04/11/16 134 lb (60.8 kg)

## 2016-06-18 NOTE — Telephone Encounter (Signed)
Oncology Nurse Navigator Documentation  Spoke with Renee [name], she confirmed availability for 5/16 8:30 NE, 9:00 consult with Dr. [name], 1:00 consult with Dr. Alvy Bimler.  [name], RN, BSN, [place] Neck Oncology Nurse [place] at [place] [phone]

## 2016-06-19 ENCOUNTER — Telehealth: Payer: Self-pay | Admitting: Internal Medicine

## 2016-06-19 NOTE — Telephone Encounter (Signed)
Spoke with the Renee [name]  She states she just had PET scan done 06/15/16 ord by Peyton Bottoms  She is currently scheduled for CT Chest on 06/27/16  She is asking if she still needs to have the CT done Please advise, thanks!

## 2016-06-20 ENCOUNTER — Encounter: Payer: Self-pay | Admitting: *Deleted

## 2016-06-20 ENCOUNTER — Ambulatory Visit (HOSPITAL_BASED_OUTPATIENT_CLINIC_OR_DEPARTMENT_OTHER): Payer: Medicare Other | Admitting: Hematology and Oncology

## 2016-06-20 ENCOUNTER — Ambulatory Visit
Admission: RE | Admit: 2016-06-20 | Discharge: 2016-06-20 | Disposition: A | Discharge status: Home / Self Care | Payer: Medicare Other | Source: Ambulatory Visit | Attending: Radiation Oncology | Admitting: Radiation Oncology

## 2016-06-20 ENCOUNTER — Encounter: Payer: Self-pay | Admitting: Radiation Oncology

## 2016-06-20 ENCOUNTER — Telehealth: Payer: Self-pay | Admitting: Internal Medicine

## 2016-06-20 VITALS — BP 140/58 | HR 82 | Temp 97.9°F | Resp 18 | Ht 66.0 in | Wt 136.3 lb

## 2016-06-20 DIAGNOSIS — C320 Malignant neoplasm of glottis: Secondary | ICD-10-CM

## 2016-06-20 DIAGNOSIS — G893 Neoplasm related pain (acute) (chronic): Secondary | ICD-10-CM

## 2016-06-20 DIAGNOSIS — Z7189 Other specified counseling: Secondary | ICD-10-CM | POA: Diagnosis not present

## 2016-06-20 DIAGNOSIS — I482 Chronic atrial fibrillation, unspecified: Secondary | ICD-10-CM

## 2016-06-20 DIAGNOSIS — C321 Malignant neoplasm of supraglottis: Secondary | ICD-10-CM

## 2016-06-20 DIAGNOSIS — I890 Lymphedema, not elsewhere classified: Secondary | ICD-10-CM | POA: Diagnosis not present

## 2016-06-20 DIAGNOSIS — Z923 Personal history of irradiation: Secondary | ICD-10-CM | POA: Diagnosis not present

## 2016-06-20 DIAGNOSIS — E039 Hypothyroidism, unspecified: Secondary | ICD-10-CM | POA: Diagnosis not present

## 2016-06-20 DIAGNOSIS — J449 Chronic obstructive pulmonary disease, unspecified: Secondary | ICD-10-CM | POA: Diagnosis not present

## 2016-06-20 DIAGNOSIS — R918 Other nonspecific abnormal finding of lung field: Secondary | ICD-10-CM

## 2016-06-20 DIAGNOSIS — I5032 Chronic diastolic (congestive) heart failure: Secondary | ICD-10-CM | POA: Diagnosis not present

## 2016-06-20 DIAGNOSIS — Z931 Gastrostomy status: Secondary | ICD-10-CM | POA: Diagnosis not present

## 2016-06-20 DIAGNOSIS — Z79899 Other long term (current) drug therapy: Secondary | ICD-10-CM | POA: Diagnosis not present

## 2016-06-20 MED ORDER — HYDROMORPHONE HCL 4 MG PO TABS: 4.0000 mg | ORAL_TABLET | ORAL | 0 refills | Status: AC | PRN

## 2016-06-20 MED ORDER — FENTANYL 25 MCG/HR TD PT72: 25.0000 ug | MEDICATED_PATCH | TRANSDERMAL | 0 refills | Status: DC

## 2016-06-20 MED FILL — HYDROmorphone HCL 4 MG TABS: 4 | 10 days supply | Qty: 60 | Fill #0

## 2016-06-20 MED FILL — fentaNYL 25 MCG/HR PT72: 25 | 15 days supply | Qty: 5 | Fill #0

## 2016-06-20 NOTE — Telephone Encounter (Signed)
Please see 06/19/16 phone note. Pt has been scheduled for 08/31/16, as that was first available. Pt is wanting to know if she should be seen sooner, as her CT is 06/27/16.  MR please advise. Thanks.

## 2016-06-20 NOTE — Telephone Encounter (Signed)
Patient called oncology wants patient to do CT Scan -so she will keep appt as scheduled -pr

## 2016-06-20 NOTE — Telephone Encounter (Signed)
Ok noted that oncology wants her to do CT now in may 2018. Please give her first avail to see me though  Dr. Brand Males, M.D., [facility].C.P Pulmonary and Critical Care Medicine Staff Physician [place] [facility] Pager: [phone], If no answer or between  15:00h - 7:00h: call [phone]  06/20/2016 1:50 PM

## 2016-06-20 NOTE — Telephone Encounter (Signed)
lmtcb x1 for pt. Please schedule pt for first available with MR. Thanks.

## 2016-06-20 NOTE — Progress Notes (Signed)
START OFF PATHWAY REGIMEN - Head and Neck   [id number]:Cisplatin 40 mg/m2 weekly (4 weeks per order sheet):   Administer weekly:     Cisplatin   **Always confirm dose/schedule in your pharmacy ordering system**    Patient Characteristics: Larynx, Metastatic (Squamous Cell), First Line Disease Classification: Larynx Current Disease Status: Metastatic Disease AJCC T Category: T2 AJCC 8 Stage Grouping: IVC AJCC N Category: cN0 AJCC M Category: M1 Line of therapy: First Line Would you be surprised if this patient died  in the next year? I would be surprised if this patient died in the next year  Intent of Therapy: Non-Curative / Palliative Intent, Discussed with Patient

## 2016-06-20 NOTE — Progress Notes (Addendum)
Radiation Oncology         [phone] ________________________________  Name: Renee [name] MRN: [id number]  Date: 06/20/2016  DOB: [birth year]  Re-Consultation Visit Note  CC: [name], MD  [name], MD  Diagnosis and Prior Radiotherapy:     ICD-9-CM ICD-10-CM   1. 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 The supraglottis and bilateral neck were treated PTV High to 70 Gy in 35 fractions at 2 Gy per fraction.  2. The supraglottis and bilateral neck were treated PTV Med to 63 Gy in 35 fractions at 1.8 Gy per fraction. 3. The supraglottis and bilateral neck were treated PTV Low to 56 Gy in 35 fractions at 1.6 Gy per fraction.  CHIEF COMPLAINT: Re-consultation regarding recurrent epiglottic carcinoma  Narrative:  The patient presented to Dr. [name] on 04/03/16 with a complaint of worsening dysphagia and discomfort on the right side of her throat. Laryngoscopy at the time revealed thickening of the right aryepiglottic fold with a slightly exophytic appearance of the tissue. The findings were consistent with persistent or recurrent disease. Dr. [name] performed biopsies on 04/12/16. Biopsy of the right larynx revealed infiltrative squamous cell carcinoma and biopsy of the right arytenoid larynx revealed invasive squamous cell carcinoma. She was seen by Dr. Vicie Mutters at [facility], on 05/02/16 to discuss salvage laryngectomy, but she declined. CT of the neck on 05/14/16 showed necrotic regions of the right supraglottic larynx with primarily linear but slightly irregular enhancement in the region of the defect or necrosis concerning for sites of residual tumor, increasing sclerosis or density of the left cricoid is present without a discrete adjacent enhancing soft tissue mass but concerning for radiation related changes or  potential tumor infiltration, a diffuse pattern of mucosal enhancement may be related to combination of mucositis although some degree of superficial tumor spread cannot be excluded, and there is similar appearance of a right upper lobe nodule or fibrosis.  The patient saw Dr. Melven Sartorius at [facility] on 06/12/16. The patient summarized that encounter and said Dr. Melven Sartorius discussed chemotherapy and possibly future immunotherapy.  PET scan on 06/15/16 showed persistent hypermetabolic activity along the right aryepiglottic fold extending down to the glottis (SUV 7.6, previously 12.1), a new 4 mm right upper lobe pulmonary nodule and a new 4 by 5 mm left upper lobe pulmonary nodule (neither were present on 03/28/2016 chest CT) (These are not hypermetabolic but are below sensitive PET-CT size thresholds. Both inflammatory and neoplastic pulmonary nodules could have this appearance, and surveillance imaging in 3 months time is suggested.), and the previous somewhat irregular 2.2 by 1.1 cm density in the right lung apex is stable in size (SUV of 2.6, formerly 3.3). There was not nodal involvement identified.  She was discussed at tumor board this morning. Medical oncology does not believe she is a good candidate for chemo-radiation due to her comorbidities. We discussed the limitations of re-irrdaitation as her primary tumor was radio-resistant. Weighting the risks and benefits of various options, it was discussed that pallliative chemotherapy would be the best course of action to control her disease.  The patient and her husband present today to discuss further treatment options. The patient reports she is is not swallowing anything orally and reports pain in her throat. She has aspiration concerns. She has a PEG and is instilling 4 cartons of  Osmolite daily. Her pain has increased since having her biopsy and she is spits up blood at times.  ALLERGIES:  is allergic to penicillins; statins; budesonide-formoterol  fumarate; levofloxacin; morphine; phenazopyridine hcl; pneumococcal vaccines; prednisone; spiriva [tiotropium bromide monohydrate]; sulfa antibiotics; and sulfonamide derivatives.  Meds: Current Outpatient Prescriptions  Medication Sig Dispense Refill  . ALPRAZolam (XANAX) 0.25 MG tablet Place 0.125 mg into feeding tube 2 (two) times daily as needed for anxiety or sleep.     [name] apixaban (ELIQUIS) 5 MG TABS tablet Take 1 tablet (5 mg total) by mouth 2 (two) times daily. 60 tablet 1  . HYDROcodone-acetaminophen (HYCET) 7.5-325 mg/15 ml solution Place 10-15 mLs into feeding tube 4 (four) times daily as needed for moderate pain. (Patient taking differently: Place 10-15 mLs into feeding tube every 4 (four) hours as needed for moderate pain. ) 1800 mL 0  . lansoprazole (PREVACID SOLUTAB) 30 MG disintegrating tablet Place 30 mg into feeding tube daily.     [name] levothyroxine (SYNTHROID, LEVOTHROID) 50 MCG tablet Take 50 mcg by mouth daily before breakfast.  6  . metoprolol succinate (TOPROL-XL) 25 MG 24 hr tablet 25 mg daily. Per tube    . nitroGLYCERIN (NITROSTAT) 0.4 MG SL tablet Place 0.4 mg under the tongue every 5 (five) minutes x 3 doses as needed for chest pain.     . Nutritional Supplements (FEEDING SUPPLEMENT, OSMOLITE 1.5 CAL,) LIQD Give 1 can of Osmolite 1.5 or equivalent - 5 times daily with 60 cc free water before and after bolus feeding via PEG.  Give additional 240 cc water BID. Send formula and supplies. (Patient taking differently: Take 237 mLs by mouth See admin instructions. Give 1 can of Osmolite 1.5 or equivalent - 4 times daily with 60 cc free water before and after bolus feeding via PEG.  Give additional 240 cc water BID. Send formula and supplies.) 1185 mL 0  . ondansetron (ZOFRAN) 4 MG tablet Take 1 tablet (4 mg total) by mouth every 8 (eight) hours as needed for nausea or vomiting. (Patient taking differently: Place 4 mg into feeding tube every 8 (eight) hours as needed for nausea or  vomiting. ) 50 tablet 3  . OXYGEN Inhale 2 L into the lungs as directed. As needed for shortness of breath    . Polyethyl Glycol-Propyl Glycol (SYSTANE OP) Place 1 drop into both eyes 2 (two) times daily.     . polyethylene glycol (MIRALAX / GLYCOLAX) packet Take 17 g by mouth daily as needed (for constipation.).    [name] sodium fluoride (FLUORISHIELD) 1.1 % GEL dental gel Instill one drop of gel per tooth space of fluoride tray. Place over teeth for 5 minutes. Remove. Spit out excess. Repeat nightly. 120 mL prn   No current facility-administered medications for this encounter.     Physical Findings: The patient is in no acute distress. Patient is alert and oriented.  height is 5\' 6"  (1.676 m) and weight is 135 lb 6.4 oz (61.4 kg). Her blood pressure is 130/81 and her pulse is 80. Her oxygen saturation is 95%. .  General: Alert and oriented, in no acute distress; hoarse HEENT: Head is normocephalic. Extraocular movements are intact. Mucosa is moist. No evidence of oral lesions. Neck: Neck is notable for healing skin. Mild lymphedema in the right cervical neck. No palpable masses in the supraclavicular or cervical regions. Heart: Regular in rate and rhythm, no murmurs. Chest: Lungs are clear bilaterally. Extremities: No swelling or lymphedema noted. Skin: Skin in  treatment fields is well healed. Psychiatric: Judgment and insight are intact. Affect is appropriate.  Lab Findings: Lab Results  Component Value Date   WBC 6.1 04/11/2016   HGB 13.8 04/11/2016   HCT 42.3 04/11/2016   MCV 97.9 04/11/2016   PLT 274 04/11/2016    Lab Results  Component Value Date   TSH 1.98 01/18/2014    Radiographic Findings:images reviewed by me Nm Pet Image Restag (ps) Skull Base To Thigh  Result Date: 06/15/2016 CLINICAL DATA:  Subsequent treatment strategy for epiglottic cancer. EXAM: NUCLEAR MEDICINE PET SKULL BASE TO THIGH TECHNIQUE: 6.6 mCi F-18 FDG was injected intravenously. Full-ring PET imaging  was performed from the skull base to thigh after the radiotracer. CT data was obtained and used for attenuation correction and anatomic localization. FASTING BLOOD GLUCOSE:  Value: 121 mg/dl COMPARISON:  Multiple exams, including 01/02/2016 and chest CT from 03/28/2016 FINDINGS: NECK Mildly hypermetabolic asymmetric activity along the right aryepiglottic fold extending down to the glottis, maximum SUV 7.6, previously 12.1. No separate pathologic adenopathy in the neck. CHEST Right apical scar-like density 2.2 by 1.1 cm, stable, with maximum SUV 2.6, formerly 3.3. New 4 mm right upper lobe nodule, image 13/8. Not visibly hypermetabolic. New 4 by 5 mm left upper lobe nodule, image 12/8. Not visibly hypermetabolic. Coronary, aortic arch, and branch vessel atherosclerotic vascular disease. ABDOMEN/PELVIS No abnormal hypermetabolic activity within the liver, pancreas, adrenal glands, or spleen. No hypermetabolic lymph nodes in the abdomen or pelvis. Aortoiliac atherosclerotic vascular disease. Peg tube noted. Prior cholecystectomy. SKELETON No focal hypermetabolic activity to suggest skeletal metastasis. Right total hip prosthesis. Prominent degenerative arthropathy of the left hip. Prior median sternotomy. IMPRESSION: 1. Persistent hypermetabolic activity along the right aryepiglottic fold extending down to the glottis, maximum SUV currently is 7.6, previously 12.1. No local nodal involvement currently. 2. There is a new 4 mm right upper lobe pulmonary nodule and a new 4 by 5 mm left upper lobe pulmonary nodule, neither were present on 03/28/2016 chest CT. These are not hypermetabolic but are below sensitive PET-CT size thresholds. Both inflammatory and neoplastic pulmonary nodules could have this appearance, and surveillance imaging in 3 months time is suggested. 3. The previous somewhat irregular 2.2 by 1.1 cm density in the right lung apex is stable in size and has a maximum SUV of 2.6, formerly 3.3. This would be an  unusual configuration for metastatic disease, but low-grade malignancy is not completely excluded and this also warrants surveillance. 4.  Aortic Atherosclerosis (ICD10-I70.0).  Coronary atherosclerosis. Electronically Signed   By: Van Clines M.D.   On: 06/15/2016 13:33    Impression/Plan:   1) Head and Neck Cancer Status: Recurrence confirmed clinically, biopsy, CT of the neck, and PET scan.  2) Nutritional Status: I discussed with the patient that she should expect to have the PEG indefinitely. Without her epiglottis, the patient may need to continue instilling some to all of her nutrition through the tube.  Weight stable. Continue using PEG   Wt Readings from Last 3 Encounters:  06/20/16 135 lb 6.4 oz (61.4 kg)  04/11/16 133 lb 6.4 oz (60.5 kg)  04/11/16 134 lb (60.8 kg)   3) Risk Factors: The patient has been educated about risk factors including alcohol and tobacco abuse; they understand that avoidance of alcohol and tobacco is important to prevent recurrences as well as other cancers. (abstaining)  4) Swallowing: following with SLP prn. She is not swallowing anything orally.    5) Dental: Encouraged to continue  regular followup with dentistry, and dental hygiene including fluoride rinses.    6) Thyroid function: Lab Results  Component Value Date   TSH 1.98 01/18/2014  She had preceding hypothyroidism which will continue to be managed by her PCP.  7) Other: The patient has mild lymphedema of the neck. I suspect that this is due to post treatment changes rather than nodal recurrence. PET scan on 06/15/16 showed no nodal involvement.  8) Right sided neck pain. Continue hycet.  9) The patient previously spoke with Dr. Vicie Mutters in regards to a laryngectomy and the patient declined. She was discussed at tumor board this morning. Due to her comorbidities, medical oncology does not believe she is a good candidate for chemotradiation due to her comorbidities. Again we discussed the  limitations of re-irradiation as her primary tumor was radio-resistant. Weighting the risks and benefits of various options, it was discussed that pallliative chemotherapy would be the best course of action to control her disease. The patient is scheduled to see Dr. Alvy Bimler today at [facility] and will be followed by her. The patient will then have a CT of the chest on 06/27/16 to act as baseline imaging if the patient proceeds with chemotherapy. Therefore, the patient will see me on a PRN basis.  I spent 25 minutes face to face with the patient, over 50% on counseling and care coordination   _____________________________________   Eppie Gibson, MD  This document serves as a record of services personally performed by Eppie Gibson, MD. It was created on her behalf by Darcus Austin, a trained medical scribe. The creation of this record is based on the scribe's personal observations and the provider's statements to them. This document has been checked and approved by the attending provider.

## 2016-06-20 NOTE — Telephone Encounter (Signed)
Can you find out please who in oncology wanted her CT this week so close after PET scan because now when I re-read PET scan it says next scan can be done in few months. I wil talk to that doctor and see what the thinking was. Based on that I can give advise on fu appt (right now she should keep the July spot)  Dr. Brand Males, M.D., [facility]C.P Pulmonary and Critical Care Medicine Staff Physician [place] [facility] Pager: [phone], If no answer or between  15:00h - 7:00h: call [phone]  06/20/2016 6:00 PM

## 2016-06-21 ENCOUNTER — Encounter: Payer: Self-pay | Admitting: Hematology and Oncology

## 2016-06-21 DIAGNOSIS — Z7189 Other specified counseling: Secondary | ICD-10-CM | POA: Insufficient documentation

## 2016-06-21 DIAGNOSIS — R7301 Impaired fasting glucose: Secondary | ICD-10-CM | POA: Diagnosis not present

## 2016-06-21 DIAGNOSIS — E538 Deficiency of other specified B group vitamins: Secondary | ICD-10-CM | POA: Diagnosis not present

## 2016-06-21 DIAGNOSIS — G893 Neoplasm related pain (acute) (chronic): Secondary | ICD-10-CM | POA: Insufficient documentation

## 2016-06-21 DIAGNOSIS — E038 Other specified hypothyroidism: Secondary | ICD-10-CM | POA: Diagnosis not present

## 2016-06-21 DIAGNOSIS — R918 Other nonspecific abnormal finding of lung field: Secondary | ICD-10-CM | POA: Insufficient documentation

## 2016-06-21 DIAGNOSIS — M810 Age-related osteoporosis without current pathological fracture: Secondary | ICD-10-CM | POA: Diagnosis not present

## 2016-06-21 DIAGNOSIS — I10 Essential (primary) hypertension: Secondary | ICD-10-CM | POA: Diagnosis not present

## 2016-06-21 DIAGNOSIS — E784 Other hyperlipidemia: Secondary | ICD-10-CM | POA: Diagnosis not present

## 2016-06-21 NOTE — Telephone Encounter (Signed)
ATC several time, unable to get through. wcb

## 2016-06-21 NOTE — Progress Notes (Signed)
Oncology Nurse Navigator Documentation  Met with Ms Renee [name] and her husband during initial consult with Dr. Alvy Bimler.   1. She reported:  Instilling 4 cans Osmolite daily through PEG.  She is unable to eat/drink orally d/t erosion of epiglottis.   Constant throat pain 4/10.  Dr. Alvy Bimler prescribed 25 mcg Fentanyl patch. 2. They voiced understanding of proposed chemo, need for PAC. 3.   Assisted with post-consult appt scheduling. I encouraged them to call with questions/concerns, they verbalized understanding.  [name], RN, BSN, [place] [facility] Nurse [place] [facility] [place] [phone]

## 2016-06-21 NOTE — Assessment & Plan Note (Signed)
I have prolonged discussion with the patient and her husband. The patient understood the rationale of not pursuing further surgery and radiation treatments. The only option left would be palliative chemotherapy. There are indeterminate lung nodules suspicious for possible metastatic cancer. We discussed risk, benefits, side effects of treatment. Immunotherapy is not consider first line for patient who has never received chemotherapy We discussed combination treatment versus single agent I recommend low dose cisplatin weekly to start with. I recommend port placement, chemo education class and blood drawn I will see her back in 2 weeks for further discussion, chemotherapy consent and start of treatment, hopefully by July 06, 2016.

## 2016-06-21 NOTE — Assessment & Plan Note (Signed)
She is on chronic anticoagulation therapy for atrial fibrillation. She need to hold anticoagulation treatment for 48 hours before port placement.  On those 2 days, she would take 81 mg aspirin and resume anticoagulation treatment after port placement

## 2016-06-21 NOTE — Progress Notes (Signed)
[facility] NOTE  Patient Care Team: [name], MD as PCP - General (Internal Medicine) Elsie Stain, MD (Pulmonary Disease) Martinique, Peter M, MD (Cardiology) Leota Sauers, RN as Oncology Nurse Navigator Eppie Gibson, MD as Attending Physician (Radiation Oncology)  CHIEF COMPLAINTS/PURPOSE OF CONSULTATION:  Persistent epiglottis cancer, for further management  HISTORY OF PRESENTING ILLNESS:  Renee [name] 82 y.o. female is here because of persistent disease after radiation treatment. According to the patient, the first initial presentation was due to throat pain. I have reviewed the outside records and collaborated the history with the patient. 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 Direct visualization & possible biopsy recommended. No adenopathy.       05/20/2015 Imaging    CT neck: Nodularity of (L) epiglottis could represent a mass of polyp. No adenopathy.       06/03/2015 Initial Biopsy    Direct laryngoscopy [name]). Epiglottis biopsy: Squamous cell carcinoma.  (R) lateral wall larynx biopsy: Squamous cell carcinoma, moderately to poorly differentiated. p16 strongly (+) on both specimens.       06/30/2015 PET scan    Hypermetabolic thickening of epiglottis which extends inferiorly on (R) to level of arytenoid cartilage. 3 sub-cm hypermetabolic (R) cervical LNs consistent with nodal  mets. 2 level 2 and 1 level 3 LN. No distant mets.       07/19/2015 - 09/08/2015 Radiation Therapy    IMRT [name]). The supraglottis and bilateral neck were treated to 70 Gy in 35 fractions. Intermediate risk nodal echelons were treated  to 63 Gy in 35 fractions.  Lower risk nodal echelons were treated to 56 Gy in 35 fractions      08/08/2015  Procedure    Successful 20 French pull-through gastrostomy.      03/28/2016 Imaging    CT chest: 1. Persistent 1.2 cm nodular density within the right lung apex which showed mild hypermetabolism on previous exam. This is not significantly changed in size from 01/02/2016. Findings may reflect sequelae of prior inflammation or infection. Although stability since 01/02/2016 favors a benign process Metastatic disease or primary lung neoplasm not excluded. Continued interval follow-up is advised to ensure stability of this abnormality. Suggest follow-up imaging at 6-12 months. 2. Diffuse bronchial wall thickening with emphysema, as above; imaging findings suggestive of underlying COPD.      04/12/2016 Pathology Results    1. Larynx, biopsy, Right INFILTRATIVE SQUAMOUS CELL CARCINOMA 2. Larynx, biopsy, Right Arytenoid INVASIVE SQUAMOUS CELL CARCINOMA      06/15/2016 PET scan    1. Persistent hypermetabolic activity along the right aryepiglottic fold extending down to the glottis, maximum SUV currently is 7.6, previously 12.1. No local nodal involvement currently. 2. There is a new 4 mm right upper lobe pulmonary nodule and a new 4 by 5 mm left upper lobe pulmonary nodule, neither were present on 03/28/2016 chest CT. These are not hypermetabolic but are below sensitive PET-CT size thresholds. Both inflammatory and neoplastic pulmonary nodules could have this appearance, and surveillance imaging in 3 months time is suggested. 3. The previous somewhat irregular 2.2 by 1.1 cm density in the right lung apex is stable in size and has a maximum SUV of 2.6, formerly 3.3. This would be an unusual configuration for metastatic disease,  but low-grade malignancy is not completely excluded and this also warrants surveillance. 4.  Aortic Atherosclerosis (ICD10-I70.0).  Coronary atherosclerosis.       Her case has been discussed extensively at the ENT tumor board this morning.  The patient has been referred back to  [facility] to discuss surgical options and she has seen medical oncologist. The patient has declined surgery The patient has very poor baseline performance status including end-stage COPD, oxygen dependent for several years at night along with history of congestive heart failure. Currently,she denies any hearing deficit, changes in the quality of her voice or abnormal weight loss.  Since radiation treatment, she has not been able to swallow.  She is dependent on tube feeding for nutritional supplement. She is prescribed 4 cans of Osmolite per day along with water flushes. She is prescribed hydrocodone for pain control and does not relieve her pain. The pain is located in her throat At baseline, she had at least 4 out of 10 pain. The pain is constant and will wake her up at night  If she does not take her pain medicine, it would be very severe. Each dose only last 3-1/2 hours. She denies significant constipation.  MEDICAL HISTORY:  Past Medical History:  Diagnosis Date  . Anxiety    takes Xanax as needed  . Arthritis   . Brain aneurysm   . Cancer (HCC)    epiglottis (throat)   . Chronic back pain    buldging disc  . Chronic bronchitis ([place])   . Chronic diastolic CHF (congestive heart failure) (HCC)    was on Lasix but has been off for a few months  . Chronic respiratory failure ([place])   . Coronary artery disease    a. CABG 09/2009. b. normal myoview in 05/2012.   [name] Dysphagia   . Emphysema lung ([place])   . Emphysema/COPD ([place])    on home O2  . GERD (gastroesophageal reflux disease)    takes Nexium daily  . History of colon polyps    benign  . History of radiation therapy 07/19/15- 09/08/15   Supraglottis and bilateral neck.   . History of shingles   . History of vertigo    doesn't take any meds  . HLD (hyperlipidemia)    can't take meds d/t joint aches/pains  . Hypertension   . Hypothyroidism    takes Synthroid daily  . IBS (irritable bowel syndrome)   . Internal hemorrhoids    . Macular degeneration    dry  . Myocardial infarction [facility]) 2009   takes Eliquis daily.  . On supplemental oxygen therapy    at night  . Orthostatic hypotension   . Osteoporosis   . PAF (paroxysmal atrial fibrillation) ([place])    a. Post op after CABG. Intolerant of amio in the past. b. Recurrence - started on Tikosyn 11/2013.  [name] Pneumonia    several yrs ago  . PONV (postoperative nausea and vomiting)   . RBBB    takes Metoporolol daily  . Sciatic nerve pain     SURGICAL HISTORY: Past Surgical History:  Procedure Laterality Date  . APPENDECTOMY  1995  . ATRIAL FIBRILLATION ABLATION N/A 05/11/2014   Procedure: ATRIAL FIBRILLATION ABLATION;  Surgeon: Thompson Grayer, MD;  Location[facility] C[facility] LAB;  Service: Cardiovascular;  Laterality: N/A;  . CARDIAC CATHETERIZATION  1990's; 06/2009  . CATARACT EXTRACTION W/ INTRAOCULAR LENS  IMPLANT, BILATERAL Bilateral   . CHOLECYSTECTOMY  2008  . colonosocpy    . CORONARY ARTERY  BYPASS GRAFT  09/21/2009   CABG X2  . DIRECT LARYNGOSCOPY N/A 06/03/2015   Procedure: Direct laryngoscopy with biopsy left epiglottic lesion;  Surgeon: [name], MD;  Location: [facility];  Service: ENT;  Laterality: N/A;  . DIRECT LARYNGOSCOPY Right 04/12/2016   Procedure: DIRECT LARYNGOSCOPY;  Surgeon: [name], MD;  Location: [facility];  Service: ENT;  Laterality: Right;  . ESOPHAGOGASTRODUODENOSCOPY    . ESOPHAGOSCOPY N/A 04/12/2016   Procedure: ESOPHAGOSCOPY;  Surgeon: [name], MD;  Location: [facility];  Service: ENT;  Laterality: N/A;  . IR GENERIC HISTORICAL  11/15/2015   IR RADIOLOGIST EVAL & MGMT 11/15/2015 Saverio Danker, PA-C [facility]-INTERV RAD  . JOINT REPLACEMENT    . LARYNX SURGERY  ~ 1960   "tumor removed"  . RETINAL LASER PROCEDURE Left 10/19/2013   "had a wrinkle in it"  . TEE WITHOUT CARDIOVERSION N/A 05/10/2014   Procedure: TRANSESOPHAGEAL ECHOCARDIOGRAM (TEE);  Surgeon: Sueanne Margarita, MD;  Location: [facility] ENDOSCOPY;  Service: Cardiovascular;  Laterality: N/A;  .  TONSILLECTOMY AND ADENOIDECTOMY    . TOTAL ABDOMINAL HYSTERECTOMY  1986  . TOTAL HIP ARTHROPLASTY Right 11/01/2008  . TUBAL LIGATION  1973    SOCIAL HISTORY: Social History   Social History  . Marital status: Married    Spouse name: N/A  . Number of children: 1  . Years of education: N/A   Occupational History  . nurse     retired   Social History Main Topics  . Smoking status: Former Smoker    Packs/day: 1.00    Years: 40.00    Types: Cigarettes    Quit date: 04/21/2008  . Smokeless tobacco: Never Used  . Alcohol use No  . Drug use: No  . Sexual activity: No   Other Topics Concern  . Not on file   Social History Narrative        FAMILY HISTORY: Family History  Problem Relation Age of Onset  . Heart disease Mother   . Transient ischemic attack Mother   . Hypertension Mother   . Deep vein thrombosis Mother   . Heart attack Father   . Hypertension Father   . Diabetes Father   . Hyperlipidemia Father   . Breast cancer Sister   . Myasthenia gravis Brother   . Brain cancer Sister        mets from breast  . Colon polyps Brother   . Hypertension Brother   . Colon cancer Daughter   . Colon cancer Other        nephew    ALLERGIES:  is allergic to penicillins; statins; budesonide-formoterol fumarate; levofloxacin; morphine; phenazopyridine hcl; pneumococcal vaccines; prednisone; spiriva [tiotropium bromide monohydrate]; sulfa antibiotics; and sulfonamide derivatives.  MEDICATIONS:  Current Outpatient Prescriptions  Medication Sig Dispense Refill  . ALPRAZolam (XANAX) 0.25 MG tablet Place 0.125 mg into feeding tube 2 (two) times daily as needed for anxiety or sleep.     [name] apixaban (ELIQUIS) 5 MG TABS tablet Take 1 tablet (5 mg total) by mouth 2 (two) times daily. 60 tablet 1  . HYDROcodone-acetaminophen (HYCET) 7.5-325 mg/15 ml solution Place 10-15 mLs into feeding tube 4 (four) times daily as needed for moderate pain. (Patient taking differently: Place 10-15 mLs  into feeding tube every 4 (four) hours as needed for moderate pain. ) 1800 mL 0  . lansoprazole (PREVACID SOLUTAB) 30 MG disintegrating tablet Place 30 mg into feeding tube daily.     [name] levothyroxine (SYNTHROID, LEVOTHROID) 50 MCG tablet Take 50  mcg by mouth daily before breakfast.  6  . metoprolol succinate (TOPROL-XL) 25 MG 24 hr tablet 25 mg daily. Per tube    . Nutritional Supplements (FEEDING SUPPLEMENT, OSMOLITE 1.5 CAL,) LIQD Give 1 can of Osmolite 1.5 or equivalent - 5 times daily with 60 cc free water before and after bolus feeding via PEG.  Give additional 240 cc water BID. Send formula and supplies. (Patient taking differently: Take 237 mLs by mouth See admin instructions. Give 1 can of Osmolite 1.5 or equivalent - 4 times daily with 60 cc free water before and after bolus feeding via PEG.  Give additional 240 cc water BID. Send formula and supplies.) 1185 mL 0  . ondansetron (ZOFRAN) 4 MG tablet Take 1 tablet (4 mg total) by mouth every 8 (eight) hours as needed for nausea or vomiting. (Patient taking differently: Place 4 mg into feeding tube every 8 (eight) hours as needed for nausea or vomiting. ) 50 tablet 3  . OXYGEN Inhale 2 L into the lungs as directed. As needed for shortness of breath    . Polyethyl Glycol-Propyl Glycol (SYSTANE OP) Place 1 drop into both eyes 2 (two) times daily.     . polyethylene glycol (MIRALAX / GLYCOLAX) packet Take 17 g by mouth daily as needed (for constipation.).    [name] sodium fluoride (FLUORISHIELD) 1.1 % GEL dental gel Instill one drop of gel per tooth space of fluoride tray. Place over teeth for 5 minutes. Remove. Spit out excess. Repeat nightly. 120 mL prn  . fentaNYL (DURAGESIC - DOSED MCG/HR) 25 MCG/HR patch Place 1 patch (25 mcg total) onto the skin every 3 (three) days. 5 patch 0  . HYDROmorphone (DILAUDID) 4 MG tablet Take 1 tablet (4 mg total) by mouth every 4 (four) hours as needed for severe pain. 60 tablet 0  . nitroGLYCERIN (NITROSTAT) 0.4 MG SL  tablet Place 0.4 mg under the tongue every 5 (five) minutes x 3 doses as needed for chest pain.      No current facility-administered medications for this visit.     REVIEW OF SYSTEMS:   Constitutional: Denies fevers, chills or abnormal night sweats Eyes: Denies blurriness of vision, double vision or watery eyes Respiratory: Denies cough, dyspnea or wheezes Cardiovascular: Denies palpitation, chest discomfort or lower extremity swelling Gastrointestinal:  Denies nausea, heartburn or change in bowel habits Skin: Denies abnormal skin rashes Lymphatics: Denies new lymphadenopathy or easy bruising Neurological:Denies numbness, tingling or new weaknesses Behavioral/Psych: Mood is stable, no new changes  All other systems were reviewed with the patient and are negative.  PHYSICAL EXAMINATION: ECOG PERFORMANCE STATUS: 2 - Symptomatic, <50% confined to bed  Vitals:   06/20/16 1300  BP: (!) 140/58  Pulse: 82  Resp: 18  Temp: 97.9 F (36.6 C)   Filed Weights   06/20/16 1300  Weight: 136 lb 4.8 oz (61.8 kg)    GENERAL:alert, no distress and comfortable.  She looks thin, frail, sitting on the wheelchair SKIN: skin color, texture, turgor are normal, no rashes or significant lesions EYES: normal, conjunctiva are pink and non-injected, sclera clear OROPHARYNX:no exudate, no erythema and lips, buccal mucosa, and tongue normal  NECK: supple, thyroid normal size, non-tender, without nodularity LYMPH:  no palpable lymphadenopathy in the cervical, axillary or inguinal LUNGS: clear to auscultation and percussion with normal breathing effort HEART: regular rate & rhythm and no murmurs and no lower extremity edema ABDOMEN:abdomen soft, non-tender and normal bowel sounds.  Feeding tube site looks okay Musculoskeletal:no  cyanosis of digits and no clubbing  PSYCH: alert & oriented x 3 with fluent speech NEURO: no focal motor/sensory deficits  LABORATORY DATA:  I have reviewed the data as  listed Lab Results  Component Value Date   WBC 6.1 04/11/2016   HGB 13.8 04/11/2016   HCT 42.3 04/11/2016   MCV 97.9 04/11/2016   PLT 274 04/11/2016   Lab Results  Component Value Date   NA 136 04/11/2016   K 4.1 04/11/2016   CL 98 (L) 04/11/2016   CO2 28 04/11/2016    RADIOGRAPHIC STUDIES: I have personally reviewed the radiological images as listed and agreed with the findings in the report. Nm Pet Image Restag (ps) Skull Base To Thigh  Result Date: 06/15/2016 CLINICAL DATA:  Subsequent treatment strategy for epiglottic cancer. EXAM: NUCLEAR MEDICINE PET SKULL BASE TO THIGH TECHNIQUE: 6.6 mCi F-18 FDG was injected intravenously. Full-ring PET imaging was performed from the skull base to thigh after the radiotracer. CT data was obtained and used for attenuation correction and anatomic localization. FASTING BLOOD GLUCOSE:  Value: 121 mg/dl COMPARISON:  Multiple exams, including 01/02/2016 and chest CT from 03/28/2016 FINDINGS: NECK Mildly hypermetabolic asymmetric activity along the right aryepiglottic fold extending down to the glottis, maximum SUV 7.6, previously 12.1. No separate pathologic adenopathy in the neck. CHEST Right apical scar-like density 2.2 by 1.1 cm, stable, with maximum SUV 2.6, formerly 3.3. New 4 mm right upper lobe nodule, image 13/8. Not visibly hypermetabolic. New 4 by 5 mm left upper lobe nodule, image 12/8. Not visibly hypermetabolic. Coronary, aortic arch, and branch vessel atherosclerotic vascular disease. ABDOMEN/PELVIS No abnormal hypermetabolic activity within the liver, pancreas, adrenal glands, or spleen. No hypermetabolic lymph nodes in the abdomen or pelvis. Aortoiliac atherosclerotic vascular disease. Peg tube noted. Prior cholecystectomy. SKELETON No focal hypermetabolic activity to suggest skeletal metastasis. Right total hip prosthesis. Prominent degenerative arthropathy of the left hip. Prior median sternotomy. IMPRESSION: 1. Persistent hypermetabolic  activity along the right aryepiglottic fold extending down to the glottis, maximum SUV currently is 7.6, previously 12.1. No local nodal involvement currently. 2. There is a new 4 mm right upper lobe pulmonary nodule and a new 4 by 5 mm left upper lobe pulmonary nodule, neither were present on 03/28/2016 chest CT. These are not hypermetabolic but are below sensitive PET-CT size thresholds. Both inflammatory and neoplastic pulmonary nodules could have this appearance, and surveillance imaging in 3 months time is suggested. 3. The previous somewhat irregular 2.2 by 1.1 cm density in the right lung apex is stable in size and has a maximum SUV of 2.6, formerly 3.3. This would be an unusual configuration for metastatic disease, but low-grade malignancy is not completely excluded and this also warrants surveillance. 4.  Aortic Atherosclerosis (ICD10-I70.0).  Coronary atherosclerosis. Electronically Signed   By: Van Clines M.D.   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 The patient understood the rationale of not pursuing further surgery and radiation treatments. The only option left would be palliative chemotherapy. There are indeterminate lung nodules suspicious for possible metastatic cancer. We discussed risk, benefits, side effects of treatment. Immunotherapy is not consider first line for patient who has never received chemotherapy We discussed combination treatment versus single agent I recommend low dose cisplatin weekly to start with. I recommend port placement, chemo education class and blood drawn I will see her back in 2 weeks for further discussion,  chemotherapy consent and start of treatment, hopefully by July 06, 2016.  A-fib [facility]) She is on chronic anticoagulation therapy for atrial fibrillation. She need to hold anticoagulation treatment for 48 hours before port placement.  On those 2  days, she would take 81 mg aspirin and resume anticoagulation treatment after port placement  Chronic diastolic CHF (congestive heart failure) ([place]) She has no recent signs or symptoms of congestive heart failure.  Continue medical management  COPD (chronic obstructive pulmonary disease) ([place]) She has no signs of active COPD exacerbation. Continue medical management  Multiple lung nodules She has multiple lung nodules suspicious for new metastasis. We will observe for now.  Biopsy is not indicated  Cancer associated pain She has poorly controlled cancer pain. I recommend trial of fentanyl patch and breakthrough pain medication with Dilaudid. I will reassess pain control in 2 weeks I did warn her about risk of constipation, sedation and nausea. We discussed narcotic refill policy  Goals of care, counseling/discussion The patient is aware she has incurable disease and treatment is strictly palliative. We discussed importance of Advanced Directives and Living will. She has advanced directives in file.  I reviewed that with the patient and her husband We discussed CODE STATUS; the patient desires to remain in full code.    Orders Placed This Encounter  Procedures  . IR FLUORO GUIDE PORT INSERTION RIGHT    Standing Status:   Future    Standing Expiration Date:   08/20/2017    Order Specific Question:   Reason for Exam (SYMPTOM  OR DIAGNOSIS REQUIRED)    Answer:   need port for chemo    Order Specific Question:   Preferred Imaging Location?    Answer:   [facility]    All questions were answered. The patient knows to call the clinic with any problems, questions or concerns. I spent 65 minutes counseling the patient face to face. The total time spent in the appointment was 95 minutes and more than 50% was on counseling.     Heath Lark, MD 06/21/16 7:03 AM

## 2016-06-21 NOTE — Assessment & Plan Note (Signed)
She has poorly controlled cancer pain. I recommend trial of fentanyl patch and breakthrough pain medication with Dilaudid. I will reassess pain control in 2 weeks I did warn her about risk of constipation, sedation and nausea. We discussed narcotic refill policy

## 2016-06-21 NOTE — Assessment & Plan Note (Signed)
She has multiple lung nodules suspicious for new metastasis. We will observe for now.  Biopsy is not indicated

## 2016-06-21 NOTE — Assessment & Plan Note (Signed)
She has no recent signs or symptoms of congestive heart failure.  Continue medical management

## 2016-06-21 NOTE — Assessment & Plan Note (Signed)
The patient is aware she has incurable disease and treatment is strictly palliative. We discussed importance of Advanced Directives and Living will. She has advanced directives in file.  I reviewed that with the patient and her husband We discussed CODE STATUS; the patient desires to remain in full code.

## 2016-06-21 NOTE — Assessment & Plan Note (Signed)
She has no signs of active COPD exacerbation. Continue medical management

## 2016-06-22 ENCOUNTER — Telehealth: Payer: Self-pay | Admitting: *Deleted

## 2016-06-22 ENCOUNTER — Telehealth: Payer: Self-pay | Admitting: Medical Oncology

## 2016-06-22 NOTE — Telephone Encounter (Signed)
Notified to take dilaudid for pain instead of using pain

## 2016-06-22 NOTE — Telephone Encounter (Signed)
Started fentanyl patch wed , yesterday did not feel well with sob.She left patch on overnight and woke up at 5am today  still sob. She took patch off thinking sob related to fentanyl and she said her breathing is already better. She thought she tried this patch before ( per Dr  Lanell Persons ) and remembered she could not take it. She denies itching or other symptoms. She does wear oxygen prn. I told her to stop patches. She has not taken any dilaudid yet.

## 2016-06-22 NOTE — Telephone Encounter (Signed)
Try dilaudid only then

## 2016-06-25 ENCOUNTER — Telehealth: Payer: Self-pay

## 2016-06-25 ENCOUNTER — Telehealth: Payer: Self-pay | Admitting: Hematology and Oncology

## 2016-06-25 NOTE — Telephone Encounter (Signed)
1) please have someone work on orders/instructions from 5/16 (none are scheduled) 2) please call in prednisone 10 mg daily for nausea, pain and appetite

## 2016-06-25 NOTE — Telephone Encounter (Signed)
Spoke with patient re 5/23 f/u.

## 2016-06-25 NOTE — Telephone Encounter (Signed)
Called patient with below message, verbalized understanding. Instructed patient to come and see Dr. Alvy Bimler after CT scan on 5-23.

## 2016-06-25 NOTE — Telephone Encounter (Signed)
Pt called to let Dr Alvy Bimler know she cannot take the new pain meds. Dr Alvy Bimler was trying to help her get through the night without pain.   1st attempt was duragesic. Could not do the patch b/c made her SOB. This RN deleted this from med list and added it to allergy list.  The 2nd attempt was dilaudid, it makes her nauseated. Not vomiting but very nauseated. zofran helped some but still nauseated. Does not last through the night.   She took some hycet prescribed by Dr [name]. It lasts about 3 1/2 to 4 hours.  She wakes in the night with pain.

## 2016-06-25 NOTE — Telephone Encounter (Signed)
MR  According to pt it was Dr. [name] who stated she should go ahead with the CT scan

## 2016-06-25 NOTE — Telephone Encounter (Signed)
OK, I have no further suggestions for now She can try half or 1/4 dose dilaudid

## 2016-06-25 NOTE — Telephone Encounter (Signed)
Called pt. She is allergic to prednisone. It makes her swell. She does not remember to what extent.

## 2016-06-26 NOTE — Telephone Encounter (Signed)
Patient is calling to see if she needs to go for CT tomorrow or not.  Patient's call back is [phone].

## 2016-06-26 NOTE — Telephone Encounter (Signed)
Called and spoke to pt. Pt is unsure if she should have CT chest tomorrow. Dr. [name] ordered the PET and MR ordered the CT chest. Per pt, Dr. [name] just said to have it done, pt does not want to do this. Pt is requesting to hold off on the CT chest and see what MR would like to do. CT has been cancelled for 06/27/16, pt aware.   Dr. Chase Caller please advise if pt should have CT soon or wait 3 months. Thanks.

## 2016-06-27 ENCOUNTER — Encounter: Payer: Self-pay | Admitting: *Deleted

## 2016-06-27 ENCOUNTER — Inpatient Hospital Stay: Admission: RE | Admit: 2016-06-27 | Payer: Self-pay | Source: Ambulatory Visit

## 2016-06-27 ENCOUNTER — Ambulatory Visit (HOSPITAL_BASED_OUTPATIENT_CLINIC_OR_DEPARTMENT_OTHER): Payer: Medicare Other | Admitting: Hematology and Oncology

## 2016-06-27 ENCOUNTER — Telehealth: Payer: Self-pay | Admitting: *Deleted

## 2016-06-27 ENCOUNTER — Encounter: Payer: Self-pay | Admitting: Hematology and Oncology

## 2016-06-27 DIAGNOSIS — Z7189 Other specified counseling: Secondary | ICD-10-CM | POA: Diagnosis not present

## 2016-06-27 DIAGNOSIS — C321 Malignant neoplasm of supraglottis: Secondary | ICD-10-CM

## 2016-06-27 DIAGNOSIS — G893 Neoplasm related pain (acute) (chronic): Secondary | ICD-10-CM | POA: Diagnosis not present

## 2016-06-27 MED ORDER — OXYCODONE HCL 15 MG PO TABS: 15.0000 mg | ORAL_TABLET | ORAL | 0 refills | Status: AC | PRN

## 2016-06-27 NOTE — Assessment & Plan Note (Signed)
She has poorly controlled pain Long-acting pain medicine has caused profound respiratory suppression She cannot tolerate Dilaudid due to nausea She is allergic to morphine The current prescription hydrocodone was inadequate to control her pain After significant discussion, we are in agreement to try oxycodone I recommend palliative care consult for pain management

## 2016-06-27 NOTE — Assessment & Plan Note (Signed)
The patient is aware she has incurable disease and treatment is strictly palliative. We discussed importance of Advanced Directives and Living will. She has advanced directives in file.  I reviewed that with the patient and her husband She is in agreement not to pursue palliative chemotherapy. I will refer her for home-based palliative care/hospice

## 2016-06-27 NOTE — Telephone Encounter (Signed)
Referral called to [facility] 

## 2016-06-27 NOTE — Assessment & Plan Note (Addendum)
I have a long discussion with the patient and her husband. She is very frail She has poorly controlled pain with multiple drug allergies She was not able to tolerate Dilaudid because of profound nausea despite taking Zofran She cannot tolerate fentanyl patch because she had trouble breathing after application of fentanyl for 4 hours at night  She is allergic to steroids Overall, with a poor tolerance to different medications, I have expressed concerns about prescribing chemotherapy With poorly controlled baseline symptoms, it is unlikely she will be able to tolerate chemotherapy that would cause more profound side effects I am concerned about prescribing alternative treatment such as Compazine that could cause sedation or lorazepam that could cause respiratory depression for nausea If she develops allergic reactions to chemotherapy treatment, she will survive resuscitation efforts because she is allergic to steroids With her frail status and planned single agent chemotherapy, at most, I can improve survival up to a year but not more than that due to her stage IV incurable cancer Ultimately, after significant discussion, we are in agreement to focus on palliative care/support only and forego plans for palliative chemotherapy We also discussed possibility of referral to other cancer center for second opinion but the patient declined.

## 2016-06-27 NOTE — Progress Notes (Addendum)
Oncology Nurse Navigator Documentation  Met with [name] during Established Patient appt with Dr. Alvy Bimler.  She was accompanied by her husband, Ed. She reported:  Constant throat pain 5/10 that is managed during day with Hycet but not while sleeping.  Recent Rx for dilaudid causes nausea which is not controlled by Zofran.  Experienced SOB with Fentanyl recently Rxed, discontinued.  Received Rx for oxycodone from Dr. Alvy Bimler today as alternative to current analgesics.  Continuing to use home O2. They voiced understanding of:  Discussion re chemo SEs and increased concern given her current fragile condition, difficulty with anit-emetics and prednisone.  Prognosis with and without chemotherapy. They agreed to hospice referral. I provided further support s/p appt with Dr. Alvy Bimler, further explained hospice services. They know to call me with needs/concerns.  [name], RN, BSN, [place] [facility] Oncology Nurse [place] at [place] [phone]

## 2016-06-27 NOTE — Progress Notes (Signed)
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 Direct visualization & possible biopsy recommended. No adenopathy.       05/20/2015 Imaging    CT neck: Nodularity of (L) epiglottis could represent a mass of polyp. No adenopathy.       06/03/2015 Initial Biopsy    Direct laryngoscopy [name]). Epiglottis biopsy: Squamous cell carcinoma.  (R) lateral wall larynx biopsy: Squamous cell carcinoma, moderately to poorly differentiated. p16 strongly (+) on both specimens.       06/30/2015 PET scan    Hypermetabolic thickening of epiglottis which extends inferiorly on (R) to level of arytenoid cartilage. 3 sub-cm hypermetabolic (R) cervical LNs consistent with nodal  mets. 2 level 2 and 1 level 3 LN. No distant mets.       07/19/2015 - 09/08/2015 Radiation Therapy    IMRT [name]). The supraglottis and bilateral neck were treated to 70 Gy in 35 fractions. Intermediate risk nodal echelons were treated  to 63 Gy in 35 fractions.  Lower risk nodal echelons were treated to 56 Gy in 35 fractions      08/08/2015 Procedure    Successful 20 French pull-through gastrostomy.      03/28/2016 Imaging    CT chest: 1. Persistent 1.2 cm nodular density within the right lung apex which showed mild hypermetabolism on previous exam. This is not significantly changed in size from 01/02/2016. Findings may reflect sequelae of prior inflammation or infection. Although stability since 01/02/2016 favors a benign process Metastatic disease or primary lung neoplasm not excluded. Continued interval follow-up is  advised to ensure stability of this abnormality. Suggest follow-up imaging at 6-12 months. 2. Diffuse bronchial wall thickening with emphysema, as above; imaging findings suggestive of underlying COPD.      04/12/2016 Pathology Results    1. Larynx, biopsy, Right INFILTRATIVE SQUAMOUS CELL CARCINOMA 2. Larynx, biopsy, Right Arytenoid INVASIVE SQUAMOUS CELL CARCINOMA      06/15/2016 PET scan    1. Persistent hypermetabolic activity along the right aryepiglottic fold extending down to the glottis, maximum SUV currently is 7.6, previously 12.1. No local nodal involvement currently. 2. There is a new 4 mm right upper lobe pulmonary nodule and a new 4 by 5 mm left upper lobe pulmonary nodule, neither were present on 03/28/2016 chest CT. These are not hypermetabolic but are below sensitive PET-CT size thresholds. Both inflammatory and neoplastic pulmonary nodules could have this appearance, and surveillance imaging in 3 months time is suggested. 3. The previous somewhat irregular 2.2 by 1.1 cm density in the right lung apex is stable in size and has a maximum SUV of 2.6, formerly 3.3. This would be an unusual configuration for metastatic disease, but low-grade malignancy is not completely excluded and this also warrants surveillance. 4.  Aortic Atherosclerosis (ICD10-I70.0).  Coronary atherosclerosis.        INTERVAL HISTORY: Please see below for problem oriented charting. She returns today for further management. We try fentanyl patch for pain management.  After 4 hours, she felt that she could not breathe. We try Dilaudid for breakthrough pain medicine She had such profound nausea and vomiting  with Dilaudid She took laxative to stimulate bowel movement and appears to be doing well with laxative therapy She is very weak overall Her appetite remained poor She continues to have excessive secretion from her throat Her pain remained poorly controlled at this point due to inability to tolerate other  forms of pain medicine  REVIEW OF SYSTEMS:   Constitutional: Denies fevers, chills or abnormal weight loss Eyes: Denies blurriness of vision Respiratory: Denies cough, dyspnea or wheezes Cardiovascular: Denies palpitation, chest discomfort or lower extremity swelling Gastrointestinal:  Denies nausea, heartburn or change in bowel habits Skin: Denies abnormal skin rashes Lymphatics: Denies new lymphadenopathy or easy bruising Neurological:Denies numbness, tingling  Behavioral/Psych: Mood is stable, no new changes  All other systems were reviewed with the patient and are negative.  I have reviewed the past medical history, past surgical history, social history and family history with the patient and they are unchanged from previous note.  ALLERGIES:  is allergic to fentanyl; penicillins; statins; budesonide-formoterol fumarate; levofloxacin; morphine; phenazopyridine hcl; pneumococcal vaccines; prednisone; spiriva [tiotropium bromide monohydrate]; sulfa antibiotics; and sulfonamide derivatives.  MEDICATIONS:  Current Outpatient Prescriptions  Medication Sig Dispense Refill  . ALPRAZolam (XANAX) 0.25 MG tablet Place 0.125 mg into feeding tube 2 (two) times daily as needed for anxiety or sleep.     [name] apixaban (ELIQUIS) 5 MG TABS tablet Take 1 tablet (5 mg total) by mouth 2 (two) times daily. 60 tablet 1  . HYDROcodone-acetaminophen (HYCET) 7.5-325 mg/15 ml solution Place 10-15 mLs into feeding tube 4 (four) times daily as needed for moderate pain. (Patient taking differently: Place 10-15 mLs into feeding tube every 4 (four) hours as needed for moderate pain. ) 1800 mL 0  . HYDROmorphone (DILAUDID) 4 MG tablet Take 1 tablet (4 mg total) by mouth every 4 (four) hours as needed for severe pain. 60 tablet 0  . lansoprazole (PREVACID SOLUTAB) 30 MG disintegrating tablet Place 30 mg into feeding tube daily.     [name] levothyroxine (SYNTHROID, LEVOTHROID) 50 MCG tablet Take 50 mcg by mouth daily before  breakfast.  6  . metoprolol succinate (TOPROL-XL) 25 MG 24 hr tablet 25 mg daily. Per tube    . nitroGLYCERIN (NITROSTAT) 0.4 MG SL tablet Place 0.4 mg under the tongue every 5 (five) minutes x 3 doses as needed for chest pain.     . Nutritional Supplements (FEEDING SUPPLEMENT, OSMOLITE 1.5 CAL,) LIQD Give 1 can of Osmolite 1.5 or equivalent - 5 times daily with 60 cc free water before and after bolus feeding via PEG.  Give additional 240 cc water BID. Send formula and supplies. (Patient taking differently: Take 237 mLs by mouth See admin instructions. Give 1 can of Osmolite 1.5 or equivalent - 4 times daily with 60 cc free water before and after bolus feeding via PEG.  Give additional 240 cc water BID. Send formula and supplies.) 1185 mL 0  . ondansetron (ZOFRAN) 4 MG tablet Take 1 tablet (4 mg total) by mouth every 8 (eight) hours as needed for nausea or vomiting. (Patient taking differently: Place 4 mg into feeding tube every 8 (eight) hours as needed for nausea or vomiting. ) 50 tablet 3  . OXYGEN Inhale 2 L into the lungs as directed. As needed for shortness of breath    . Polyethyl Glycol-Propyl Glycol (SYSTANE OP) Place 1 drop into both eyes 2 (two) times daily.     . polyethylene glycol (MIRALAX / GLYCOLAX) packet Take 17 g by mouth daily as needed (  for constipation.).    [name] sodium fluoride (FLUORISHIELD) 1.1 % GEL dental gel Instill one drop of gel per tooth space of fluoride tray. Place over teeth for 5 minutes. Remove. Spit out excess. Repeat nightly. 120 mL prn  . oxyCODONE (ROXICODONE) 15 MG immediate release tablet Take 1 tablet (15 mg total) by mouth every 4 (four) hours as needed for severe pain. 30 tablet 0   No current facility-administered medications for this visit.     PHYSICAL EXAMINATION: ECOG PERFORMANCE STATUS: 2 - Symptomatic, <50% confined to bed  Vitals:   06/27/16 1132  BP: 133/64  Pulse: 83  Resp: 17  Temp: 98.3 F (36.8 C)   Filed Weights   06/27/16 1132   Weight: 135 lb 14.4 oz (61.6 kg)    GENERAL:alert, no distress and comfortable.  She looks thin and cachectic SKIN: skin color, texture, turgor are normal, no rashes or significant lesions EYES: normal, Conjunctiva are pink and non-injected, sclera clear Musculoskeletal:no cyanosis of digits and no clubbing  NEURO: alert & oriented x 3 with fluent speech, no focal motor/sensory deficits  LABORATORY DATA:  I have reviewed the data as listed    Component Value Date/Time   NA 136 04/11/2016 1330   NA 141 02/28/2016 1441   NA 138 09/19/2015 1428   K 4.1 04/11/2016 1330   K 4.1 09/19/2015 1428   CL 98 (L) 04/11/2016 1330   CO2 28 04/11/2016 1330   CO2 28 09/19/2015 1428   GLUCOSE 128 (H) 04/11/2016 1330   GLUCOSE 106 09/19/2015 1428   BUN 25 (H) 04/11/2016 1330   BUN 22 02/28/2016 1441   BUN 19.0 09/19/2015 1428   CREATININE 0.77 04/11/2016 1330   CREATININE 0.8 09/19/2015 1428   CALCIUM 9.8 04/11/2016 1330   CALCIUM 10.2 09/19/2015 1428   PROT 7.9 08/15/2015 0941   ALBUMIN 3.3 (L) 08/15/2015 0941   AST 23 08/15/2015 0941   ALT <9 08/15/2015 0941   ALKPHOS 142 08/15/2015 0941   BILITOT 0.85 08/15/2015 0941   GFRNONAA >60 04/11/2016 1330   GFRAA >60 04/11/2016 1330    No results found for: SPEP, UPEP  Lab Results  Component Value Date   WBC 6.1 04/11/2016   NEUTROABS 4.2 02/28/2016   HGB 13.8 04/11/2016   HCT 42.3 04/11/2016   MCV 97.9 04/11/2016   PLT 274 04/11/2016      Chemistry      Component Value Date/Time   NA 136 04/11/2016 1330   NA 141 02/28/2016 1441   NA 138 09/19/2015 1428   K 4.1 04/11/2016 1330   K 4.1 09/19/2015 1428   CL 98 (L) 04/11/2016 1330   CO2 28 04/11/2016 1330   CO2 28 09/19/2015 1428   BUN 25 (H) 04/11/2016 1330   BUN 22 02/28/2016 1441   BUN 19.0 09/19/2015 1428   CREATININE 0.77 04/11/2016 1330   CREATININE 0.8 09/19/2015 1428      Component Value Date/Time   CALCIUM 9.8 04/11/2016 1330   CALCIUM 10.2 09/19/2015 1428    ALKPHOS 142 08/15/2015 0941   AST 23 08/15/2015 0941   ALT <9 08/15/2015 0941   BILITOT 0.85 08/15/2015 0941       RADIOGRAPHIC STUDIES: I have personally reviewed the radiological images as listed and agreed with the findings in the report. Nm Pet Image Restag (ps) Skull Base To Thigh  Result Date: 06/15/2016 CLINICAL DATA:  Subsequent treatment strategy for epiglottic cancer. EXAM: NUCLEAR MEDICINE PET SKULL BASE TO THIGH TECHNIQUE:  6.6 mCi F-18 FDG was injected intravenously. Full-ring PET imaging was performed from the skull base to thigh after the radiotracer. CT data was obtained and used for attenuation correction and anatomic localization. FASTING BLOOD GLUCOSE:  Value: 121 mg/dl COMPARISON:  Multiple exams, including 01/02/2016 and chest CT from 03/28/2016 FINDINGS: NECK Mildly hypermetabolic asymmetric activity along the right aryepiglottic fold extending down to the glottis, maximum SUV 7.6, previously 12.1. No separate pathologic adenopathy in the neck. CHEST Right apical scar-like density 2.2 by 1.1 cm, stable, with maximum SUV 2.6, formerly 3.3. New 4 mm right upper lobe nodule, image 13/8. Not visibly hypermetabolic. New 4 by 5 mm left upper lobe nodule, image 12/8. Not visibly hypermetabolic. Coronary, aortic arch, and branch vessel atherosclerotic vascular disease. ABDOMEN/PELVIS No abnormal hypermetabolic activity within the liver, pancreas, adrenal glands, or spleen. No hypermetabolic lymph nodes in the abdomen or pelvis. Aortoiliac atherosclerotic vascular disease. Peg tube noted. Prior cholecystectomy. SKELETON No focal hypermetabolic activity to suggest skeletal metastasis. Right total hip prosthesis. Prominent degenerative arthropathy of the left hip. Prior median sternotomy. IMPRESSION: 1. Persistent hypermetabolic activity along the right aryepiglottic fold extending down to the glottis, maximum SUV currently is 7.6, previously 12.1. No local nodal involvement currently. 2. There  is a new 4 mm right upper lobe pulmonary nodule and a new 4 by 5 mm left upper lobe pulmonary nodule, neither were present on 03/28/2016 chest CT. These are not hypermetabolic but are below sensitive PET-CT size thresholds. Both inflammatory and neoplastic pulmonary nodules could have this appearance, and surveillance imaging in 3 months time is suggested. 3. The previous somewhat irregular 2.2 by 1.1 cm density in the right lung apex is stable in size and has a maximum SUV of 2.6, formerly 3.3. This would be an unusual configuration for metastatic disease, but low-grade malignancy is not completely excluded and this also warrants surveillance. 4.  Aortic Atherosclerosis (ICD10-I70.0).  Coronary atherosclerosis. Electronically Signed   By: Van Clines M.D.   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 She is very frail She has poorly controlled pain with multiple drug allergies She was not able to tolerate Dilaudid because of profound nausea despite taking Zofran She cannot tolerate fentanyl patch because she had trouble breathing after application of fentanyl for 4 hours at night  She is allergic to steroids Overall, with a poor tolerance to different medications, I have expressed concerns about prescribing chemotherapy With poorly controlled baseline symptoms, it is unlikely she will be able to tolerate chemotherapy that would cause more profound side effects I am concerned about prescribing alternative treatment such as Compazine that could cause sedation or lorazepam that could cause respiratory depression for nausea If she develops allergic reactions to chemotherapy treatment, she will survive resuscitation efforts because she is allergic to steroids With her frail status and planned single agent chemotherapy, at most, I can improve survival up to a year but not more than that due to her stage IV  incurable cancer Ultimately, after significant discussion, we are in agreement to focus on palliative care/support only and forego plans for palliative chemotherapy We also discussed possibility of referral to other cancer center for second opinion but the patient declined.     Cancer associated pain She has poorly controlled pain Long-acting pain medicine has caused profound respiratory suppression She cannot tolerate Dilaudid due to nausea She is allergic to morphine The current  prescription hydrocodone was inadequate to control her pain After significant discussion, we are in agreement to try oxycodone I recommend palliative care consult for pain management  Goals of care, counseling/discussion The patient is aware she has incurable disease and treatment is strictly palliative. We discussed importance of Advanced Directives and Living will. She has advanced directives in file.  I reviewed that with the patient and her husband She is in agreement not to pursue palliative chemotherapy. I will refer her for home-based palliative care/hospice   No orders of the defined types were placed in this encounter.  All questions were answered. The patient knows to call the clinic with any problems, questions or concerns. No barriers to learning was detected. I spent 30 minutes counseling the patient face to face. The total time spent in the appointment was 40 minutes and more than 50% was on counseling and review of test results     Heath Lark, MD 06/27/2016 2:31 PM

## 2016-06-28 ENCOUNTER — Other Ambulatory Visit: Payer: Self-pay

## 2016-06-29 ENCOUNTER — Ambulatory Visit: Payer: Self-pay | Admitting: Physician Assistant

## 2016-06-29 ENCOUNTER — Telehealth: Payer: Self-pay | Admitting: *Deleted

## 2016-06-29 DIAGNOSIS — F419 Anxiety disorder, unspecified: Secondary | ICD-10-CM | POA: Diagnosis not present

## 2016-06-29 DIAGNOSIS — I2510 Atherosclerotic heart disease of native coronary artery without angina pectoris: Secondary | ICD-10-CM | POA: Diagnosis not present

## 2016-06-29 DIAGNOSIS — C321 Malignant neoplasm of supraglottis: Secondary | ICD-10-CM | POA: Diagnosis not present

## 2016-06-29 DIAGNOSIS — Z431 Encounter for attention to gastrostomy: Secondary | ICD-10-CM | POA: Diagnosis not present

## 2016-06-29 DIAGNOSIS — R1310 Dysphagia, unspecified: Secondary | ICD-10-CM | POA: Diagnosis not present

## 2016-06-29 DIAGNOSIS — Z87891 Personal history of nicotine dependence: Secondary | ICD-10-CM | POA: Diagnosis not present

## 2016-06-29 DIAGNOSIS — J449 Chronic obstructive pulmonary disease, unspecified: Secondary | ICD-10-CM | POA: Diagnosis not present

## 2016-06-29 DIAGNOSIS — I4891 Unspecified atrial fibrillation: Secondary | ICD-10-CM | POA: Diagnosis not present

## 2016-06-29 DIAGNOSIS — Z9981 Dependence on supplemental oxygen: Secondary | ICD-10-CM | POA: Diagnosis not present

## 2016-06-29 DIAGNOSIS — I5032 Chronic diastolic (congestive) heart failure: Secondary | ICD-10-CM | POA: Diagnosis not present

## 2016-06-29 NOTE — Telephone Encounter (Signed)
Oncology Nurse Navigator Documentation  Spoke with Ms. Heimann in follow-up to her husband's VM yesterday. I confirmed her future [facility] appts have been cancelled. Per Dr. [name], I assured her she can be seen by Dr. [name] PRN even though she is enrolled with hospice. She stated [facility] is coming by this afternoon to see her to address current medications. She understands I am available to her, Mr. Schader or her daughter.  [name], RN, BSN, [place] [facility] Oncology Nurse [place] at [place] [phone]

## 2016-07-01 DIAGNOSIS — Z431 Encounter for attention to gastrostomy: Secondary | ICD-10-CM | POA: Diagnosis not present

## 2016-07-01 DIAGNOSIS — J449 Chronic obstructive pulmonary disease, unspecified: Secondary | ICD-10-CM | POA: Diagnosis not present

## 2016-07-01 DIAGNOSIS — C321 Malignant neoplasm of supraglottis: Secondary | ICD-10-CM | POA: Diagnosis not present

## 2016-07-01 DIAGNOSIS — R1310 Dysphagia, unspecified: Secondary | ICD-10-CM | POA: Diagnosis not present

## 2016-07-01 DIAGNOSIS — Z9981 Dependence on supplemental oxygen: Secondary | ICD-10-CM | POA: Diagnosis not present

## 2016-07-01 DIAGNOSIS — I5032 Chronic diastolic (congestive) heart failure: Secondary | ICD-10-CM | POA: Diagnosis not present

## 2016-07-03 ENCOUNTER — Other Ambulatory Visit (HOSPITAL_COMMUNITY): Payer: Self-pay

## 2016-07-03 ENCOUNTER — Ambulatory Visit (HOSPITAL_COMMUNITY): Payer: Medicare Other

## 2016-07-03 DIAGNOSIS — R300 Dysuria: Secondary | ICD-10-CM | POA: Diagnosis not present

## 2016-07-03 DIAGNOSIS — I5032 Chronic diastolic (congestive) heart failure: Secondary | ICD-10-CM | POA: Diagnosis not present

## 2016-07-03 DIAGNOSIS — Z9981 Dependence on supplemental oxygen: Secondary | ICD-10-CM | POA: Diagnosis not present

## 2016-07-03 DIAGNOSIS — C321 Malignant neoplasm of supraglottis: Secondary | ICD-10-CM | POA: Diagnosis not present

## 2016-07-03 DIAGNOSIS — Z431 Encounter for attention to gastrostomy: Secondary | ICD-10-CM | POA: Diagnosis not present

## 2016-07-03 DIAGNOSIS — R1310 Dysphagia, unspecified: Secondary | ICD-10-CM | POA: Diagnosis not present

## 2016-07-03 DIAGNOSIS — J449 Chronic obstructive pulmonary disease, unspecified: Secondary | ICD-10-CM | POA: Diagnosis not present

## 2016-07-03 NOTE — Telephone Encounter (Signed)
MR please advise on the CT scan.  Does she need to wait 3 months or have the CT scan done sooner.  thanks

## 2016-07-04 ENCOUNTER — Ambulatory Visit: Payer: Self-pay | Admitting: Hematology and Oncology

## 2016-07-04 ENCOUNTER — Other Ambulatory Visit: Payer: Self-pay

## 2016-07-04 DIAGNOSIS — Z9981 Dependence on supplemental oxygen: Secondary | ICD-10-CM | POA: Diagnosis not present

## 2016-07-04 DIAGNOSIS — C321 Malignant neoplasm of supraglottis: Secondary | ICD-10-CM | POA: Diagnosis not present

## 2016-07-04 DIAGNOSIS — J449 Chronic obstructive pulmonary disease, unspecified: Secondary | ICD-10-CM | POA: Diagnosis not present

## 2016-07-04 DIAGNOSIS — Z431 Encounter for attention to gastrostomy: Secondary | ICD-10-CM | POA: Diagnosis not present

## 2016-07-04 DIAGNOSIS — R1310 Dysphagia, unspecified: Secondary | ICD-10-CM | POA: Diagnosis not present

## 2016-07-04 DIAGNOSIS — I5032 Chronic diastolic (congestive) heart failure: Secondary | ICD-10-CM | POA: Diagnosis not present

## 2016-07-05 ENCOUNTER — Other Ambulatory Visit: Payer: Self-pay

## 2016-07-06 DIAGNOSIS — Z87891 Personal history of nicotine dependence: Secondary | ICD-10-CM | POA: Diagnosis not present

## 2016-07-06 DIAGNOSIS — Z9981 Dependence on supplemental oxygen: Secondary | ICD-10-CM | POA: Diagnosis not present

## 2016-07-06 DIAGNOSIS — F419 Anxiety disorder, unspecified: Secondary | ICD-10-CM | POA: Diagnosis not present

## 2016-07-06 DIAGNOSIS — I2510 Atherosclerotic heart disease of native coronary artery without angina pectoris: Secondary | ICD-10-CM | POA: Diagnosis not present

## 2016-07-06 DIAGNOSIS — R1310 Dysphagia, unspecified: Secondary | ICD-10-CM | POA: Diagnosis not present

## 2016-07-06 DIAGNOSIS — Z431 Encounter for attention to gastrostomy: Secondary | ICD-10-CM | POA: Diagnosis not present

## 2016-07-06 DIAGNOSIS — I5032 Chronic diastolic (congestive) heart failure: Secondary | ICD-10-CM | POA: Diagnosis not present

## 2016-07-06 DIAGNOSIS — I4891 Unspecified atrial fibrillation: Secondary | ICD-10-CM | POA: Diagnosis not present

## 2016-07-06 DIAGNOSIS — J449 Chronic obstructive pulmonary disease, unspecified: Secondary | ICD-10-CM | POA: Diagnosis not present

## 2016-07-06 DIAGNOSIS — C321 Malignant neoplasm of supraglottis: Secondary | ICD-10-CM | POA: Diagnosis not present

## 2016-07-09 DIAGNOSIS — I5032 Chronic diastolic (congestive) heart failure: Secondary | ICD-10-CM | POA: Diagnosis not present

## 2016-07-09 DIAGNOSIS — Z9981 Dependence on supplemental oxygen: Secondary | ICD-10-CM | POA: Diagnosis not present

## 2016-07-09 DIAGNOSIS — Z431 Encounter for attention to gastrostomy: Secondary | ICD-10-CM | POA: Diagnosis not present

## 2016-07-09 DIAGNOSIS — C321 Malignant neoplasm of supraglottis: Secondary | ICD-10-CM | POA: Diagnosis not present

## 2016-07-09 DIAGNOSIS — R1310 Dysphagia, unspecified: Secondary | ICD-10-CM | POA: Diagnosis not present

## 2016-07-09 DIAGNOSIS — J449 Chronic obstructive pulmonary disease, unspecified: Secondary | ICD-10-CM | POA: Diagnosis not present

## 2016-07-09 NOTE — Telephone Encounter (Signed)
MR please advise on CT.  Thanks.

## 2016-07-10 ENCOUNTER — Ambulatory Visit: Payer: Self-pay | Admitting: Hematology and Oncology

## 2016-07-11 ENCOUNTER — Telehealth: Payer: Self-pay | Admitting: Internal Medicine

## 2016-07-11 NOTE — Telephone Encounter (Signed)
Hi Dr Hazle Coca had this below PET scan that followed  A CT chest for nodules back in feb 2018. She is somewhat hesistatnt to get  A CT chest in close heels of the PET scan and wa asking if she could hold off, by a few months, the  CT chest to follow this below PET scan. I think that is fine and I can get one in late July/early august.   You ok with  That?  Thanks  Dr. Brand Males, M.D., [facility].C.P Pulmonary and Critical Care Medicine Staff Physician [place] [facility] Pager: [phone], If no answer or between  15:00h - 7:00h: call [phone]  07/11/2016 2:20 PM   .................[name]  MPRESSION: 1. Persistent hypermetabolic activity along the right aryepiglottic fold extending down to the glottis, maximum SUV currently is 7.6, previously 12.1. No local nodal involvement currently. 2. There is a new 4 mm right upper lobe pulmonary nodule and a new 4 by 5 mm left upper lobe pulmonary nodule, neither were present on 03/28/2016 chest CT. These are not hypermetabolic but are below sensitive PET-CT size thresholds. Both inflammatory and neoplastic pulmonary nodules could have this appearance, and surveillance imaging in 3 months time is suggested. 3. The previous somewhat irregular 2.2 by 1.1 cm density in the right lung apex is stable in size and has a maximum SUV of 2.6, formerly 3.3. This would be an unusual configuration for metastatic disease, but low-grade malignancy is not completely excluded and this also warrants surveillance. 4.  Aortic Atherosclerosis (ICD10-I70.0).  Coronary atherosclerosis.   Electronically Signed   By: Van Clines M.D.   On: 06/15/2016 13:33

## 2016-07-11 NOTE — Telephone Encounter (Signed)
MR pt is scheduled to see you in July.  She did have a PET scan done.  I do not see where her last CT scan was done on 5/23.  Will the pt need a CT done prior to her appt with you?

## 2016-07-11 NOTE — Telephone Encounter (Signed)
Called and spoke with pt and she is aware of MR recs. Nothing further is needed.  

## 2016-07-11 NOTE — Telephone Encounter (Signed)
Oh wow. Did not realize. Thanks for the update.   Reply not required  Dr. Brand Males, M.D., [facility].C.P Pulmonary and Critical Care Medicine Staff Physician [place] [facility] Pager: [phone], If no answer or between  15:00h - 7:00h: call [phone]  07/11/2016 11:15 PM

## 2016-07-11 NOTE — Telephone Encounter (Signed)
Please let patient know that I have written to Dr [name] and am waiting her response but in my opinion she only needs CT chest late July- mid august 2018. Based on Dr Lanell Persons response we can time the ct chest . Lynetta Mare just keep the appt with [place] late July 2018. If we hear otherwise from Dr [name] will let her know  Thanks  Dr. Brand Males, M.D., [facility].C.P Pulmonary and Critical Care Medicine Staff Physician [place] [facility] Pager: [phone], If no answer or between  15:00h - 7:00h: call [phone]  07/11/2016 2:21 PM

## 2016-07-13 ENCOUNTER — Ambulatory Visit: Payer: Self-pay

## 2016-07-13 DIAGNOSIS — R1310 Dysphagia, unspecified: Secondary | ICD-10-CM | POA: Diagnosis not present

## 2016-07-13 DIAGNOSIS — J449 Chronic obstructive pulmonary disease, unspecified: Secondary | ICD-10-CM | POA: Diagnosis not present

## 2016-07-13 DIAGNOSIS — I5032 Chronic diastolic (congestive) heart failure: Secondary | ICD-10-CM | POA: Diagnosis not present

## 2016-07-13 DIAGNOSIS — Z431 Encounter for attention to gastrostomy: Secondary | ICD-10-CM | POA: Diagnosis not present

## 2016-07-13 DIAGNOSIS — C321 Malignant neoplasm of supraglottis: Secondary | ICD-10-CM | POA: Diagnosis not present

## 2016-07-13 DIAGNOSIS — Z9981 Dependence on supplemental oxygen: Secondary | ICD-10-CM | POA: Diagnosis not present

## 2016-07-18 DIAGNOSIS — I5032 Chronic diastolic (congestive) heart failure: Secondary | ICD-10-CM | POA: Diagnosis not present

## 2016-07-18 DIAGNOSIS — Z431 Encounter for attention to gastrostomy: Secondary | ICD-10-CM | POA: Diagnosis not present

## 2016-07-18 DIAGNOSIS — R1310 Dysphagia, unspecified: Secondary | ICD-10-CM | POA: Diagnosis not present

## 2016-07-18 DIAGNOSIS — Z9981 Dependence on supplemental oxygen: Secondary | ICD-10-CM | POA: Diagnosis not present

## 2016-07-18 DIAGNOSIS — C321 Malignant neoplasm of supraglottis: Secondary | ICD-10-CM | POA: Diagnosis not present

## 2016-07-18 DIAGNOSIS — J449 Chronic obstructive pulmonary disease, unspecified: Secondary | ICD-10-CM | POA: Diagnosis not present

## 2016-07-20 ENCOUNTER — Ambulatory Visit: Payer: Self-pay

## 2016-07-20 DIAGNOSIS — R1310 Dysphagia, unspecified: Secondary | ICD-10-CM | POA: Diagnosis not present

## 2016-07-20 DIAGNOSIS — J449 Chronic obstructive pulmonary disease, unspecified: Secondary | ICD-10-CM | POA: Diagnosis not present

## 2016-07-20 DIAGNOSIS — I5032 Chronic diastolic (congestive) heart failure: Secondary | ICD-10-CM | POA: Diagnosis not present

## 2016-07-20 DIAGNOSIS — Z9981 Dependence on supplemental oxygen: Secondary | ICD-10-CM | POA: Diagnosis not present

## 2016-07-20 DIAGNOSIS — C321 Malignant neoplasm of supraglottis: Secondary | ICD-10-CM | POA: Diagnosis not present

## 2016-07-20 DIAGNOSIS — Z431 Encounter for attention to gastrostomy: Secondary | ICD-10-CM | POA: Diagnosis not present

## 2016-07-24 DIAGNOSIS — I5032 Chronic diastolic (congestive) heart failure: Secondary | ICD-10-CM | POA: Diagnosis not present

## 2016-07-24 DIAGNOSIS — J449 Chronic obstructive pulmonary disease, unspecified: Secondary | ICD-10-CM | POA: Diagnosis not present

## 2016-07-24 DIAGNOSIS — Z431 Encounter for attention to gastrostomy: Secondary | ICD-10-CM | POA: Diagnosis not present

## 2016-07-24 DIAGNOSIS — R1310 Dysphagia, unspecified: Secondary | ICD-10-CM | POA: Diagnosis not present

## 2016-07-24 DIAGNOSIS — Z9981 Dependence on supplemental oxygen: Secondary | ICD-10-CM | POA: Diagnosis not present

## 2016-07-24 DIAGNOSIS — C321 Malignant neoplasm of supraglottis: Secondary | ICD-10-CM | POA: Diagnosis not present

## 2016-07-27 ENCOUNTER — Ambulatory Visit: Payer: Self-pay

## 2016-07-27 DIAGNOSIS — C321 Malignant neoplasm of supraglottis: Secondary | ICD-10-CM | POA: Diagnosis not present

## 2016-07-27 DIAGNOSIS — Z9981 Dependence on supplemental oxygen: Secondary | ICD-10-CM | POA: Diagnosis not present

## 2016-07-27 DIAGNOSIS — R1310 Dysphagia, unspecified: Secondary | ICD-10-CM | POA: Diagnosis not present

## 2016-07-27 DIAGNOSIS — I5032 Chronic diastolic (congestive) heart failure: Secondary | ICD-10-CM | POA: Diagnosis not present

## 2016-07-27 DIAGNOSIS — Z431 Encounter for attention to gastrostomy: Secondary | ICD-10-CM | POA: Diagnosis not present

## 2016-07-27 DIAGNOSIS — J449 Chronic obstructive pulmonary disease, unspecified: Secondary | ICD-10-CM | POA: Diagnosis not present

## 2016-07-31 ENCOUNTER — Encounter: Payer: Self-pay | Admitting: Hematology and Oncology

## 2016-07-31 NOTE — Progress Notes (Signed)
Received PA expiring request for Ondansetron.  Submitted through cover My Meds:  Emalyn Paletta Key: [id number] Need help? Call us at [phone]  Outcome  Additional Information Required  Drug is covered by current benefit plan. No further PA activity needed.  DrugOndansetron HCl 4MG  OR TABS  [employer] administrator PA Form

## 2016-08-02 DIAGNOSIS — R1310 Dysphagia, unspecified: Secondary | ICD-10-CM | POA: Diagnosis not present

## 2016-08-02 DIAGNOSIS — C321 Malignant neoplasm of supraglottis: Secondary | ICD-10-CM | POA: Diagnosis not present

## 2016-08-02 DIAGNOSIS — I5032 Chronic diastolic (congestive) heart failure: Secondary | ICD-10-CM | POA: Diagnosis not present

## 2016-08-02 DIAGNOSIS — Z431 Encounter for attention to gastrostomy: Secondary | ICD-10-CM | POA: Diagnosis not present

## 2016-08-02 DIAGNOSIS — Z9981 Dependence on supplemental oxygen: Secondary | ICD-10-CM | POA: Diagnosis not present

## 2016-08-02 DIAGNOSIS — J449 Chronic obstructive pulmonary disease, unspecified: Secondary | ICD-10-CM | POA: Diagnosis not present

## 2016-08-03 ENCOUNTER — Ambulatory Visit: Payer: Self-pay

## 2016-08-03 DIAGNOSIS — R1310 Dysphagia, unspecified: Secondary | ICD-10-CM | POA: Diagnosis not present

## 2016-08-03 DIAGNOSIS — Z9981 Dependence on supplemental oxygen: Secondary | ICD-10-CM | POA: Diagnosis not present

## 2016-08-03 DIAGNOSIS — C321 Malignant neoplasm of supraglottis: Secondary | ICD-10-CM | POA: Diagnosis not present

## 2016-08-03 DIAGNOSIS — J449 Chronic obstructive pulmonary disease, unspecified: Secondary | ICD-10-CM | POA: Diagnosis not present

## 2016-08-03 DIAGNOSIS — Z431 Encounter for attention to gastrostomy: Secondary | ICD-10-CM | POA: Diagnosis not present

## 2016-08-03 DIAGNOSIS — I5032 Chronic diastolic (congestive) heart failure: Secondary | ICD-10-CM | POA: Diagnosis not present

## 2016-08-04 DIAGNOSIS — J449 Chronic obstructive pulmonary disease, unspecified: Secondary | ICD-10-CM | POA: Diagnosis not present

## 2016-08-04 DIAGNOSIS — C321 Malignant neoplasm of supraglottis: Secondary | ICD-10-CM | POA: Diagnosis not present

## 2016-08-04 DIAGNOSIS — Z431 Encounter for attention to gastrostomy: Secondary | ICD-10-CM | POA: Diagnosis not present

## 2016-08-04 DIAGNOSIS — R1310 Dysphagia, unspecified: Secondary | ICD-10-CM | POA: Diagnosis not present

## 2016-08-04 DIAGNOSIS — I5032 Chronic diastolic (congestive) heart failure: Secondary | ICD-10-CM | POA: Diagnosis not present

## 2016-08-04 DIAGNOSIS — Z9981 Dependence on supplemental oxygen: Secondary | ICD-10-CM | POA: Diagnosis not present

## 2016-08-05 DIAGNOSIS — R1310 Dysphagia, unspecified: Secondary | ICD-10-CM | POA: Diagnosis not present

## 2016-08-05 DIAGNOSIS — Z431 Encounter for attention to gastrostomy: Secondary | ICD-10-CM | POA: Diagnosis not present

## 2016-08-05 DIAGNOSIS — I4891 Unspecified atrial fibrillation: Secondary | ICD-10-CM | POA: Diagnosis not present

## 2016-08-05 DIAGNOSIS — I5032 Chronic diastolic (congestive) heart failure: Secondary | ICD-10-CM | POA: Diagnosis not present

## 2016-08-05 DIAGNOSIS — I2510 Atherosclerotic heart disease of native coronary artery without angina pectoris: Secondary | ICD-10-CM | POA: Diagnosis not present

## 2016-08-05 DIAGNOSIS — J449 Chronic obstructive pulmonary disease, unspecified: Secondary | ICD-10-CM | POA: Diagnosis not present

## 2016-08-05 DIAGNOSIS — C321 Malignant neoplasm of supraglottis: Secondary | ICD-10-CM | POA: Diagnosis not present

## 2016-08-05 DIAGNOSIS — Z87891 Personal history of nicotine dependence: Secondary | ICD-10-CM | POA: Diagnosis not present

## 2016-08-05 DIAGNOSIS — Z9981 Dependence on supplemental oxygen: Secondary | ICD-10-CM | POA: Diagnosis not present

## 2016-08-05 DIAGNOSIS — F419 Anxiety disorder, unspecified: Secondary | ICD-10-CM | POA: Diagnosis not present

## 2016-08-08 DIAGNOSIS — R1310 Dysphagia, unspecified: Secondary | ICD-10-CM | POA: Diagnosis not present

## 2016-08-08 DIAGNOSIS — Z431 Encounter for attention to gastrostomy: Secondary | ICD-10-CM | POA: Diagnosis not present

## 2016-08-08 DIAGNOSIS — C321 Malignant neoplasm of supraglottis: Secondary | ICD-10-CM | POA: Diagnosis not present

## 2016-08-08 DIAGNOSIS — I5032 Chronic diastolic (congestive) heart failure: Secondary | ICD-10-CM | POA: Diagnosis not present

## 2016-08-08 DIAGNOSIS — J449 Chronic obstructive pulmonary disease, unspecified: Secondary | ICD-10-CM | POA: Diagnosis not present

## 2016-08-08 DIAGNOSIS — Z9981 Dependence on supplemental oxygen: Secondary | ICD-10-CM | POA: Diagnosis not present

## 2016-08-09 DIAGNOSIS — I5032 Chronic diastolic (congestive) heart failure: Secondary | ICD-10-CM | POA: Diagnosis not present

## 2016-08-09 DIAGNOSIS — Z431 Encounter for attention to gastrostomy: Secondary | ICD-10-CM | POA: Diagnosis not present

## 2016-08-09 DIAGNOSIS — R1310 Dysphagia, unspecified: Secondary | ICD-10-CM | POA: Diagnosis not present

## 2016-08-09 DIAGNOSIS — Z9981 Dependence on supplemental oxygen: Secondary | ICD-10-CM | POA: Diagnosis not present

## 2016-08-09 DIAGNOSIS — J449 Chronic obstructive pulmonary disease, unspecified: Secondary | ICD-10-CM | POA: Diagnosis not present

## 2016-08-09 DIAGNOSIS — C321 Malignant neoplasm of supraglottis: Secondary | ICD-10-CM | POA: Diagnosis not present

## 2016-08-10 ENCOUNTER — Ambulatory Visit: Payer: Self-pay

## 2016-08-15 DIAGNOSIS — Z9981 Dependence on supplemental oxygen: Secondary | ICD-10-CM | POA: Diagnosis not present

## 2016-08-15 DIAGNOSIS — I5032 Chronic diastolic (congestive) heart failure: Secondary | ICD-10-CM | POA: Diagnosis not present

## 2016-08-15 DIAGNOSIS — R1310 Dysphagia, unspecified: Secondary | ICD-10-CM | POA: Diagnosis not present

## 2016-08-15 DIAGNOSIS — Z431 Encounter for attention to gastrostomy: Secondary | ICD-10-CM | POA: Diagnosis not present

## 2016-08-15 DIAGNOSIS — J449 Chronic obstructive pulmonary disease, unspecified: Secondary | ICD-10-CM | POA: Diagnosis not present

## 2016-08-15 DIAGNOSIS — C321 Malignant neoplasm of supraglottis: Secondary | ICD-10-CM | POA: Diagnosis not present

## 2016-08-18 DIAGNOSIS — C321 Malignant neoplasm of supraglottis: Secondary | ICD-10-CM | POA: Diagnosis not present

## 2016-08-18 DIAGNOSIS — Z431 Encounter for attention to gastrostomy: Secondary | ICD-10-CM | POA: Diagnosis not present

## 2016-08-18 DIAGNOSIS — I5032 Chronic diastolic (congestive) heart failure: Secondary | ICD-10-CM | POA: Diagnosis not present

## 2016-08-18 DIAGNOSIS — Z9981 Dependence on supplemental oxygen: Secondary | ICD-10-CM | POA: Diagnosis not present

## 2016-08-18 DIAGNOSIS — R1310 Dysphagia, unspecified: Secondary | ICD-10-CM | POA: Diagnosis not present

## 2016-08-18 DIAGNOSIS — J449 Chronic obstructive pulmonary disease, unspecified: Secondary | ICD-10-CM | POA: Diagnosis not present

## 2016-08-23 DIAGNOSIS — I5032 Chronic diastolic (congestive) heart failure: Secondary | ICD-10-CM | POA: Diagnosis not present

## 2016-08-23 DIAGNOSIS — Z431 Encounter for attention to gastrostomy: Secondary | ICD-10-CM | POA: Diagnosis not present

## 2016-08-23 DIAGNOSIS — Z9981 Dependence on supplemental oxygen: Secondary | ICD-10-CM | POA: Diagnosis not present

## 2016-08-23 DIAGNOSIS — R1310 Dysphagia, unspecified: Secondary | ICD-10-CM | POA: Diagnosis not present

## 2016-08-23 DIAGNOSIS — J449 Chronic obstructive pulmonary disease, unspecified: Secondary | ICD-10-CM | POA: Diagnosis not present

## 2016-08-23 DIAGNOSIS — C321 Malignant neoplasm of supraglottis: Secondary | ICD-10-CM | POA: Diagnosis not present

## 2016-08-28 ENCOUNTER — Other Ambulatory Visit (HOSPITAL_COMMUNITY): Payer: Self-pay | Admitting: Dentistry

## 2016-08-28 MED ORDER — SODIUM FLUORIDE 1.1 % DT GEL: DENTAL | 99 refills | Status: AC

## 2016-08-28 MED FILL — FLUORISHIELD 1.1% GEL: 1.1 % | 30 days supply | Qty: 114 | Fill #0

## 2016-08-30 DIAGNOSIS — J449 Chronic obstructive pulmonary disease, unspecified: Secondary | ICD-10-CM | POA: Diagnosis not present

## 2016-08-30 DIAGNOSIS — Z431 Encounter for attention to gastrostomy: Secondary | ICD-10-CM | POA: Diagnosis not present

## 2016-08-30 DIAGNOSIS — C321 Malignant neoplasm of supraglottis: Secondary | ICD-10-CM | POA: Diagnosis not present

## 2016-08-30 DIAGNOSIS — R1310 Dysphagia, unspecified: Secondary | ICD-10-CM | POA: Diagnosis not present

## 2016-08-30 DIAGNOSIS — I5032 Chronic diastolic (congestive) heart failure: Secondary | ICD-10-CM | POA: Diagnosis not present

## 2016-08-30 DIAGNOSIS — Z9981 Dependence on supplemental oxygen: Secondary | ICD-10-CM | POA: Diagnosis not present

## 2016-08-31 ENCOUNTER — Ambulatory Visit: Payer: Self-pay | Admitting: Internal Medicine

## 2016-08-31 DIAGNOSIS — Z431 Encounter for attention to gastrostomy: Secondary | ICD-10-CM | POA: Diagnosis not present

## 2016-08-31 DIAGNOSIS — C321 Malignant neoplasm of supraglottis: Secondary | ICD-10-CM | POA: Diagnosis not present

## 2016-08-31 DIAGNOSIS — R1310 Dysphagia, unspecified: Secondary | ICD-10-CM | POA: Diagnosis not present

## 2016-08-31 DIAGNOSIS — I5032 Chronic diastolic (congestive) heart failure: Secondary | ICD-10-CM | POA: Diagnosis not present

## 2016-08-31 DIAGNOSIS — Z9981 Dependence on supplemental oxygen: Secondary | ICD-10-CM | POA: Diagnosis not present

## 2016-08-31 DIAGNOSIS — J449 Chronic obstructive pulmonary disease, unspecified: Secondary | ICD-10-CM | POA: Diagnosis not present

## 2016-09-05 ENCOUNTER — Ambulatory Visit: Admitting: Internal Medicine

## 2016-09-05 DIAGNOSIS — I2510 Atherosclerotic heart disease of native coronary artery without angina pectoris: Secondary | ICD-10-CM | POA: Diagnosis not present

## 2016-09-05 DIAGNOSIS — R1310 Dysphagia, unspecified: Secondary | ICD-10-CM | POA: Diagnosis not present

## 2016-09-05 DIAGNOSIS — I4891 Unspecified atrial fibrillation: Secondary | ICD-10-CM | POA: Diagnosis not present

## 2016-09-05 DIAGNOSIS — Z431 Encounter for attention to gastrostomy: Secondary | ICD-10-CM | POA: Diagnosis not present

## 2016-09-05 DIAGNOSIS — Z9981 Dependence on supplemental oxygen: Secondary | ICD-10-CM | POA: Diagnosis not present

## 2016-09-05 DIAGNOSIS — J449 Chronic obstructive pulmonary disease, unspecified: Secondary | ICD-10-CM | POA: Diagnosis not present

## 2016-09-05 DIAGNOSIS — C321 Malignant neoplasm of supraglottis: Secondary | ICD-10-CM | POA: Diagnosis not present

## 2016-09-05 DIAGNOSIS — Z87891 Personal history of nicotine dependence: Secondary | ICD-10-CM | POA: Diagnosis not present

## 2016-09-05 DIAGNOSIS — F419 Anxiety disorder, unspecified: Secondary | ICD-10-CM | POA: Diagnosis not present

## 2016-09-05 DIAGNOSIS — I5032 Chronic diastolic (congestive) heart failure: Secondary | ICD-10-CM | POA: Diagnosis not present

## 2016-09-06 ENCOUNTER — Telehealth: Payer: Self-pay | Admitting: *Deleted

## 2016-09-06 DIAGNOSIS — C321 Malignant neoplasm of supraglottis: Secondary | ICD-10-CM | POA: Diagnosis not present

## 2016-09-06 DIAGNOSIS — I5032 Chronic diastolic (congestive) heart failure: Secondary | ICD-10-CM | POA: Diagnosis not present

## 2016-09-06 DIAGNOSIS — Z431 Encounter for attention to gastrostomy: Secondary | ICD-10-CM | POA: Diagnosis not present

## 2016-09-06 DIAGNOSIS — R1310 Dysphagia, unspecified: Secondary | ICD-10-CM | POA: Diagnosis not present

## 2016-09-06 DIAGNOSIS — J449 Chronic obstructive pulmonary disease, unspecified: Secondary | ICD-10-CM | POA: Diagnosis not present

## 2016-09-06 DIAGNOSIS — Z9981 Dependence on supplemental oxygen: Secondary | ICD-10-CM | POA: Diagnosis not present

## 2016-09-06 NOTE — Telephone Encounter (Signed)
Oncology Nurse Navigator Documentation  Called Renee [name] to check on her well-being. She reported "doing pretty good", hospice RN coming weekly, pain well managed, sleeping well, going out of the house on occasion (movies, cookout), is experiencing increased SOB with activity, is wearing O2 consistently.  I noted her voice sounded strong and clear to which she replied today "has been a good day, some days I can't speak at all". She expressed appreciation for my call.  [name], RN, BSN, [place] [facility] Oncology Nurse [place] at [place] [phone]

## 2016-09-11 DIAGNOSIS — C321 Malignant neoplasm of supraglottis: Secondary | ICD-10-CM | POA: Diagnosis not present

## 2016-09-11 DIAGNOSIS — Z431 Encounter for attention to gastrostomy: Secondary | ICD-10-CM | POA: Diagnosis not present

## 2016-09-11 DIAGNOSIS — J449 Chronic obstructive pulmonary disease, unspecified: Secondary | ICD-10-CM | POA: Diagnosis not present

## 2016-09-11 DIAGNOSIS — Z9981 Dependence on supplemental oxygen: Secondary | ICD-10-CM | POA: Diagnosis not present

## 2016-09-11 DIAGNOSIS — I5032 Chronic diastolic (congestive) heart failure: Secondary | ICD-10-CM | POA: Diagnosis not present

## 2016-09-11 DIAGNOSIS — R1310 Dysphagia, unspecified: Secondary | ICD-10-CM | POA: Diagnosis not present

## 2016-09-12 DIAGNOSIS — I5032 Chronic diastolic (congestive) heart failure: Secondary | ICD-10-CM | POA: Diagnosis not present

## 2016-09-12 DIAGNOSIS — Z9981 Dependence on supplemental oxygen: Secondary | ICD-10-CM | POA: Diagnosis not present

## 2016-09-12 DIAGNOSIS — Z431 Encounter for attention to gastrostomy: Secondary | ICD-10-CM | POA: Diagnosis not present

## 2016-09-12 DIAGNOSIS — C321 Malignant neoplasm of supraglottis: Secondary | ICD-10-CM | POA: Diagnosis not present

## 2016-09-12 DIAGNOSIS — R1310 Dysphagia, unspecified: Secondary | ICD-10-CM | POA: Diagnosis not present

## 2016-09-12 DIAGNOSIS — J449 Chronic obstructive pulmonary disease, unspecified: Secondary | ICD-10-CM | POA: Diagnosis not present

## 2016-09-13 DIAGNOSIS — C321 Malignant neoplasm of supraglottis: Secondary | ICD-10-CM | POA: Diagnosis not present

## 2016-09-13 DIAGNOSIS — I5032 Chronic diastolic (congestive) heart failure: Secondary | ICD-10-CM | POA: Diagnosis not present

## 2016-09-13 DIAGNOSIS — R1310 Dysphagia, unspecified: Secondary | ICD-10-CM | POA: Diagnosis not present

## 2016-09-13 DIAGNOSIS — Z431 Encounter for attention to gastrostomy: Secondary | ICD-10-CM | POA: Diagnosis not present

## 2016-09-13 DIAGNOSIS — Z9981 Dependence on supplemental oxygen: Secondary | ICD-10-CM | POA: Diagnosis not present

## 2016-09-13 DIAGNOSIS — J449 Chronic obstructive pulmonary disease, unspecified: Secondary | ICD-10-CM | POA: Diagnosis not present

## 2016-09-20 DIAGNOSIS — Z431 Encounter for attention to gastrostomy: Secondary | ICD-10-CM | POA: Diagnosis not present

## 2016-09-20 DIAGNOSIS — Z9981 Dependence on supplemental oxygen: Secondary | ICD-10-CM | POA: Diagnosis not present

## 2016-09-20 DIAGNOSIS — R1310 Dysphagia, unspecified: Secondary | ICD-10-CM | POA: Diagnosis not present

## 2016-09-20 DIAGNOSIS — I5032 Chronic diastolic (congestive) heart failure: Secondary | ICD-10-CM | POA: Diagnosis not present

## 2016-09-20 DIAGNOSIS — C321 Malignant neoplasm of supraglottis: Secondary | ICD-10-CM | POA: Diagnosis not present

## 2016-09-20 DIAGNOSIS — J449 Chronic obstructive pulmonary disease, unspecified: Secondary | ICD-10-CM | POA: Diagnosis not present

## 2016-09-24 DIAGNOSIS — C321 Malignant neoplasm of supraglottis: Secondary | ICD-10-CM | POA: Diagnosis not present

## 2016-09-24 DIAGNOSIS — Z9981 Dependence on supplemental oxygen: Secondary | ICD-10-CM | POA: Diagnosis not present

## 2016-09-24 DIAGNOSIS — J449 Chronic obstructive pulmonary disease, unspecified: Secondary | ICD-10-CM | POA: Diagnosis not present

## 2016-09-24 DIAGNOSIS — I5032 Chronic diastolic (congestive) heart failure: Secondary | ICD-10-CM | POA: Diagnosis not present

## 2016-09-24 DIAGNOSIS — R1310 Dysphagia, unspecified: Secondary | ICD-10-CM | POA: Diagnosis not present

## 2016-09-24 DIAGNOSIS — Z431 Encounter for attention to gastrostomy: Secondary | ICD-10-CM | POA: Diagnosis not present

## 2016-09-25 ENCOUNTER — Telehealth: Payer: Self-pay

## 2016-09-25 DIAGNOSIS — C321 Malignant neoplasm of supraglottis: Secondary | ICD-10-CM | POA: Diagnosis not present

## 2016-09-25 DIAGNOSIS — Z431 Encounter for attention to gastrostomy: Secondary | ICD-10-CM | POA: Diagnosis not present

## 2016-09-25 DIAGNOSIS — I5032 Chronic diastolic (congestive) heart failure: Secondary | ICD-10-CM | POA: Diagnosis not present

## 2016-09-25 DIAGNOSIS — Z9981 Dependence on supplemental oxygen: Secondary | ICD-10-CM | POA: Diagnosis not present

## 2016-09-25 DIAGNOSIS — J449 Chronic obstructive pulmonary disease, unspecified: Secondary | ICD-10-CM | POA: Diagnosis not present

## 2016-09-25 DIAGNOSIS — R1310 Dysphagia, unspecified: Secondary | ICD-10-CM | POA: Diagnosis not present

## 2016-09-25 NOTE — Telephone Encounter (Signed)
[facility] [place] [facility] called for renewal of patient's care with hospice. VO given per Dr Alvy Bimler to renew hospice.

## 2016-09-26 DIAGNOSIS — Z431 Encounter for attention to gastrostomy: Secondary | ICD-10-CM | POA: Diagnosis not present

## 2016-09-26 DIAGNOSIS — R1310 Dysphagia, unspecified: Secondary | ICD-10-CM | POA: Diagnosis not present

## 2016-09-26 DIAGNOSIS — J449 Chronic obstructive pulmonary disease, unspecified: Secondary | ICD-10-CM | POA: Diagnosis not present

## 2016-09-26 DIAGNOSIS — I5032 Chronic diastolic (congestive) heart failure: Secondary | ICD-10-CM | POA: Diagnosis not present

## 2016-09-26 DIAGNOSIS — Z9981 Dependence on supplemental oxygen: Secondary | ICD-10-CM | POA: Diagnosis not present

## 2016-09-26 DIAGNOSIS — C321 Malignant neoplasm of supraglottis: Secondary | ICD-10-CM | POA: Diagnosis not present

## 2016-09-27 DIAGNOSIS — Z9981 Dependence on supplemental oxygen: Secondary | ICD-10-CM | POA: Diagnosis not present

## 2016-09-27 DIAGNOSIS — I5032 Chronic diastolic (congestive) heart failure: Secondary | ICD-10-CM | POA: Diagnosis not present

## 2016-09-27 DIAGNOSIS — J449 Chronic obstructive pulmonary disease, unspecified: Secondary | ICD-10-CM | POA: Diagnosis not present

## 2016-09-27 DIAGNOSIS — R1310 Dysphagia, unspecified: Secondary | ICD-10-CM | POA: Diagnosis not present

## 2016-09-27 DIAGNOSIS — C321 Malignant neoplasm of supraglottis: Secondary | ICD-10-CM | POA: Diagnosis not present

## 2016-09-27 DIAGNOSIS — R05 Cough: Secondary | ICD-10-CM | POA: Diagnosis not present

## 2016-09-27 DIAGNOSIS — Z431 Encounter for attention to gastrostomy: Secondary | ICD-10-CM | POA: Diagnosis not present

## 2016-09-28 DIAGNOSIS — I5032 Chronic diastolic (congestive) heart failure: Secondary | ICD-10-CM | POA: Diagnosis not present

## 2016-09-28 DIAGNOSIS — R1310 Dysphagia, unspecified: Secondary | ICD-10-CM | POA: Diagnosis not present

## 2016-09-28 DIAGNOSIS — Z9981 Dependence on supplemental oxygen: Secondary | ICD-10-CM | POA: Diagnosis not present

## 2016-09-28 DIAGNOSIS — J449 Chronic obstructive pulmonary disease, unspecified: Secondary | ICD-10-CM | POA: Diagnosis not present

## 2016-09-28 DIAGNOSIS — Z431 Encounter for attention to gastrostomy: Secondary | ICD-10-CM | POA: Diagnosis not present

## 2016-09-28 DIAGNOSIS — C321 Malignant neoplasm of supraglottis: Secondary | ICD-10-CM | POA: Diagnosis not present

## 2016-09-29 DIAGNOSIS — R1310 Dysphagia, unspecified: Secondary | ICD-10-CM | POA: Diagnosis not present

## 2016-09-29 DIAGNOSIS — C321 Malignant neoplasm of supraglottis: Secondary | ICD-10-CM | POA: Diagnosis not present

## 2016-09-29 DIAGNOSIS — I5032 Chronic diastolic (congestive) heart failure: Secondary | ICD-10-CM | POA: Diagnosis not present

## 2016-09-29 DIAGNOSIS — Z9981 Dependence on supplemental oxygen: Secondary | ICD-10-CM | POA: Diagnosis not present

## 2016-09-29 DIAGNOSIS — J449 Chronic obstructive pulmonary disease, unspecified: Secondary | ICD-10-CM | POA: Diagnosis not present

## 2016-09-29 DIAGNOSIS — Z431 Encounter for attention to gastrostomy: Secondary | ICD-10-CM | POA: Diagnosis not present

## 2016-09-30 DIAGNOSIS — I5032 Chronic diastolic (congestive) heart failure: Secondary | ICD-10-CM | POA: Diagnosis not present

## 2016-09-30 DIAGNOSIS — R1310 Dysphagia, unspecified: Secondary | ICD-10-CM | POA: Diagnosis not present

## 2016-09-30 DIAGNOSIS — Z9981 Dependence on supplemental oxygen: Secondary | ICD-10-CM | POA: Diagnosis not present

## 2016-09-30 DIAGNOSIS — C321 Malignant neoplasm of supraglottis: Secondary | ICD-10-CM | POA: Diagnosis not present

## 2016-09-30 DIAGNOSIS — J449 Chronic obstructive pulmonary disease, unspecified: Secondary | ICD-10-CM | POA: Diagnosis not present

## 2016-09-30 DIAGNOSIS — Z431 Encounter for attention to gastrostomy: Secondary | ICD-10-CM | POA: Diagnosis not present

## 2016-10-01 ENCOUNTER — Telehealth: Payer: Self-pay | Admitting: *Deleted

## 2016-10-01 NOTE — Telephone Encounter (Signed)
[facility] called to let Dr Alvy Bimler know that Renee [name] died on October 04, 2016

## 2016-10-03 ENCOUNTER — Encounter: Payer: Self-pay | Admitting: *Deleted

## 2016-10-03 NOTE — Progress Notes (Signed)
Oncology Nurse Navigator Documentation  [place] NEWS & RECORD:  Renee [name], born [birth year], in [place], [place], passed away on Oct 29, 2016.

## 2016-10-06 DEATH — deceased

## 2016-10-10 ENCOUNTER — Ambulatory Visit: Payer: Self-pay | Admitting: Radiation Oncology

## 2017-07-06 IMAGING — CT NM PET TUM IMG RESTAG (PS) SKULL BASE T - THIGH
9 series · 25 of 25 positions shown · non-contrast
Comparison: 06/30/2015 and CT [place] 05/20/2015.

CLINICAL DATA: Subsequent treatment strategy for epiglottic cancer.

EXAM:
NUCLEAR MEDICINE PET SKULL BASE TO THIGH
TECHNIQUE: 6.6 mCi F-18 FDG was injected intravenously. Full-ring PET imaging
was performed from the skull base to thigh after the radiotracer. CT
data was obtained and used for attenuation correction and anatomic
localization.
FASTING BLOOD GLUCOSE:  Value: 100 mg/dl

[Series 3: pet hn_sk_thigh ac · axial · 5.0mm · 4.07mm/px · z∈[-1138,-318]mm · 4 of 206 slices shown]
[im 1/206]
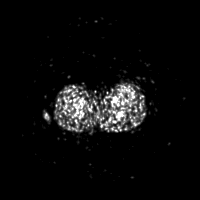
[im 69/206]
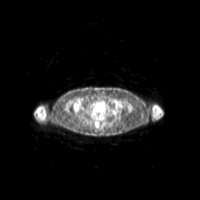
[im 137/206]
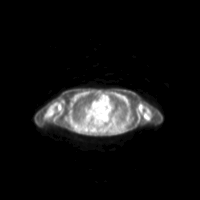
[im 206/206]
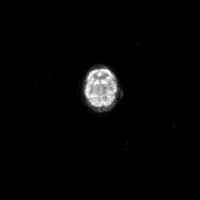

[Series 4: ct hn_sk_th 5.0 hd_fov · axial · 5.0mm · 1.07mm/px · z∈[-1138,-318]mm · 5 of 206 slices shown]
[im 1/206]
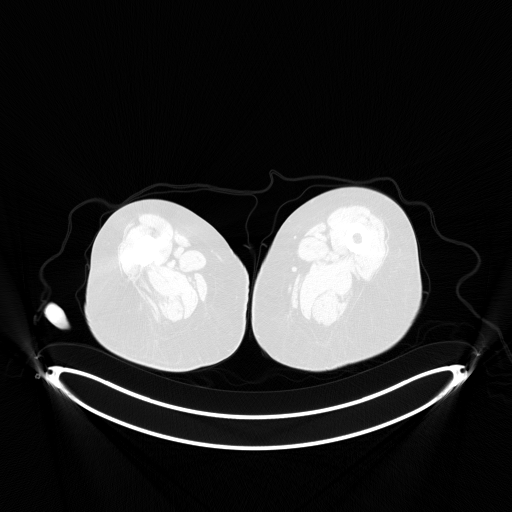
[im 52/206]
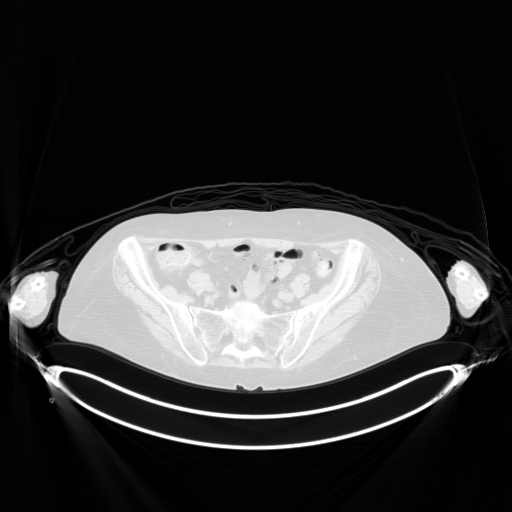
[im 103/206]
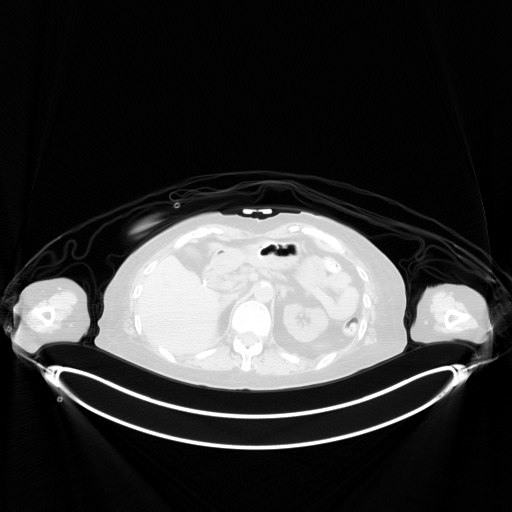
[im 154/206]
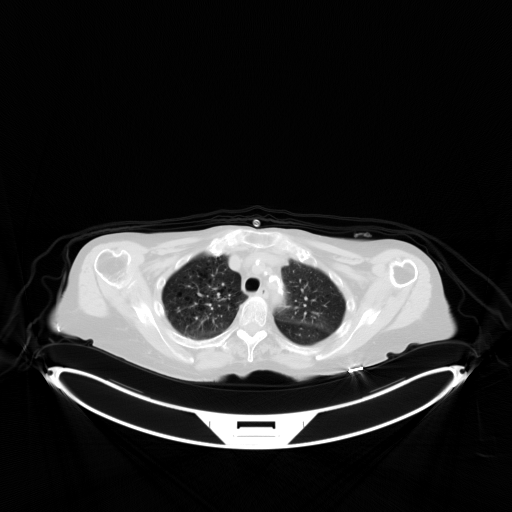
[im 206/206]
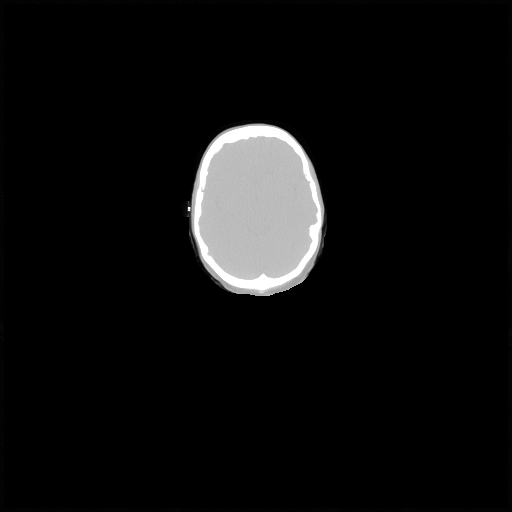

[Series 7: ct hn_sk_th 5.0 b70f (id)_bone · axial · 5.0mm · 0.59mm/px · 1 of 56 slices shown]
[im 1/56  bone]
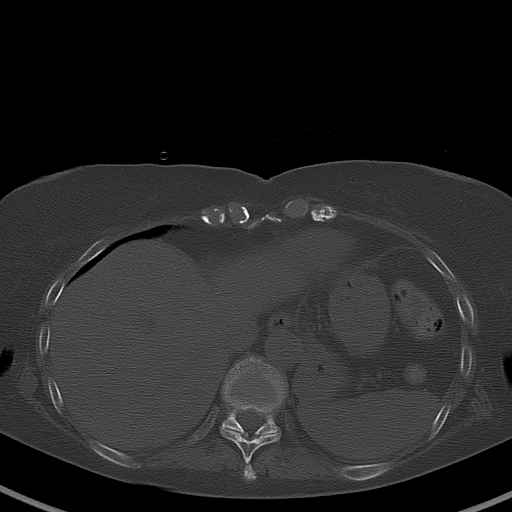

[Series 8: pet hn_sk_thigh nac · axial · 5.0mm · 4.07mm/px · z∈[-1138,-318]mm · 5 of 206 slices shown]
[im 1/206]
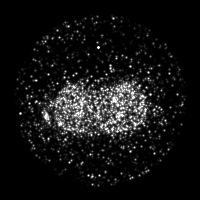
[im 52/206]
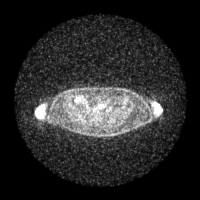
[im 103/206]
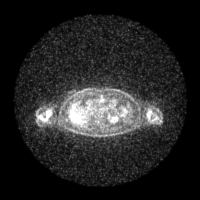
[im 154/206]
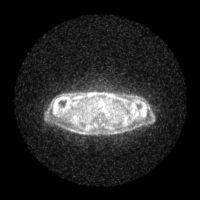
[im 206/206]
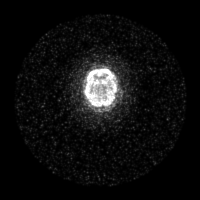

[Series 604: range-ct hn_sk_th 5.0 hd_fov-cor-<alpha range> · 2 of 73 slices shown]
[im 1/73]
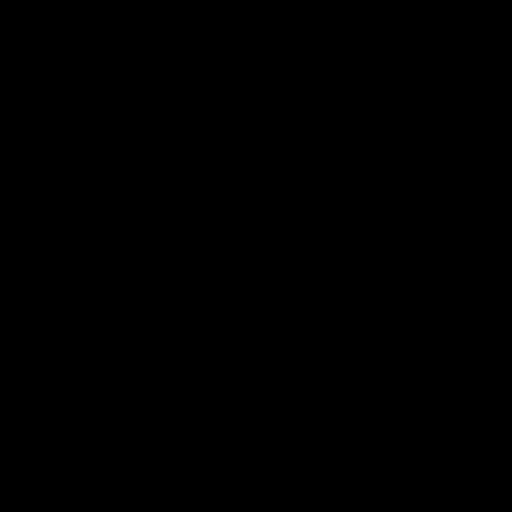
[im 73/73]
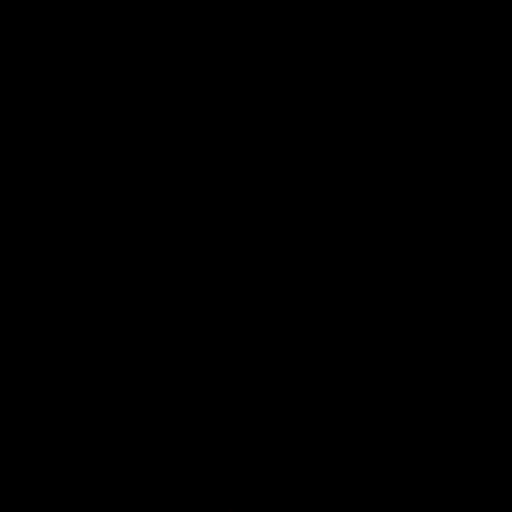

[Series 605: mip collection · coronal · 1.70mm/px · 1 of 32 slices shown]
[im 1/32]
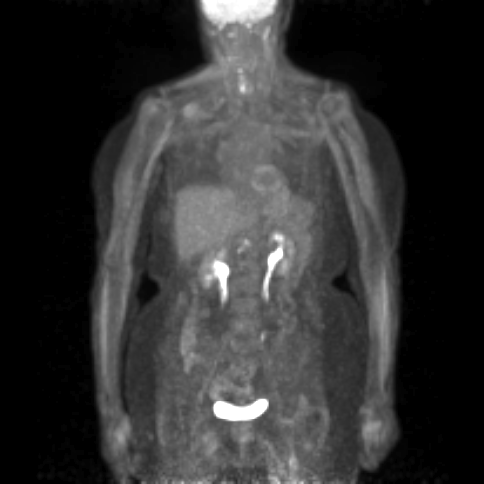

[Series 606: range-ct hn_sk_th 5.0 hd_fov-tra-<alpha range> · 5 of 194 slices shown]
[im 1/194]
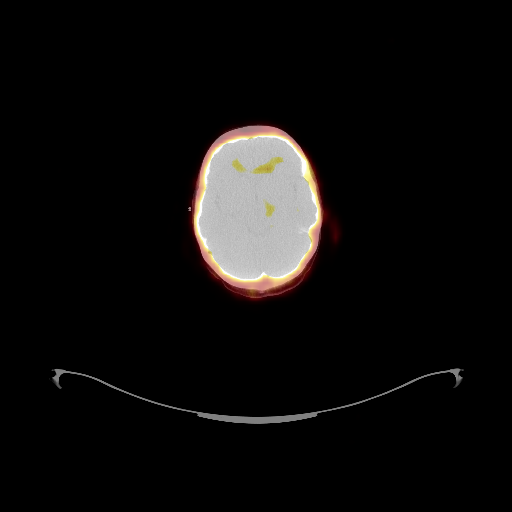
[im 49/194]
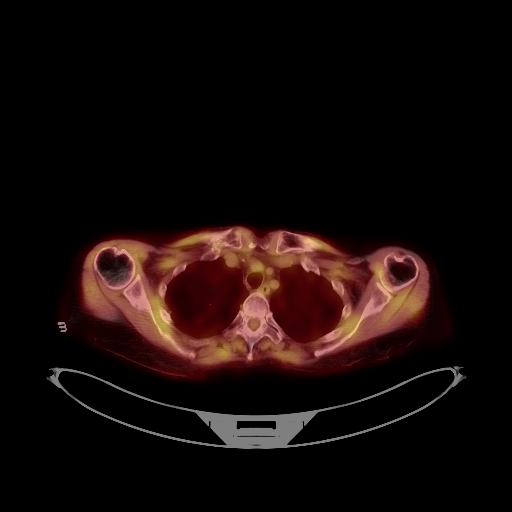
[im 97/194]
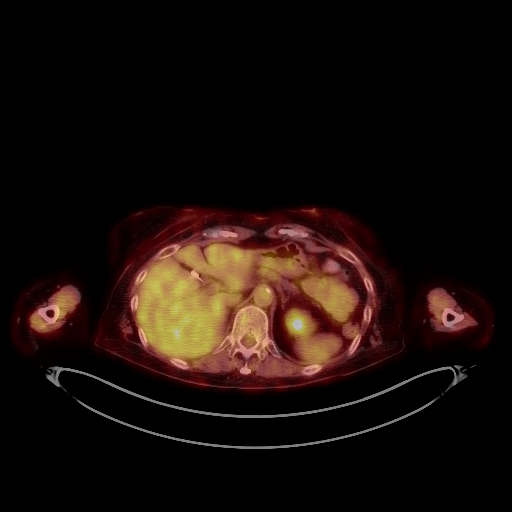
[im 145/194]
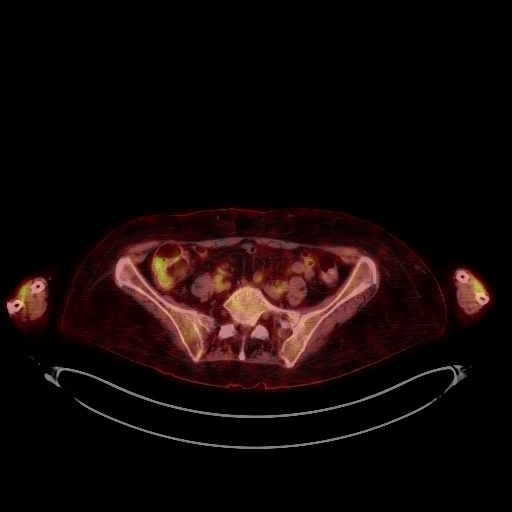
[im 194/194]
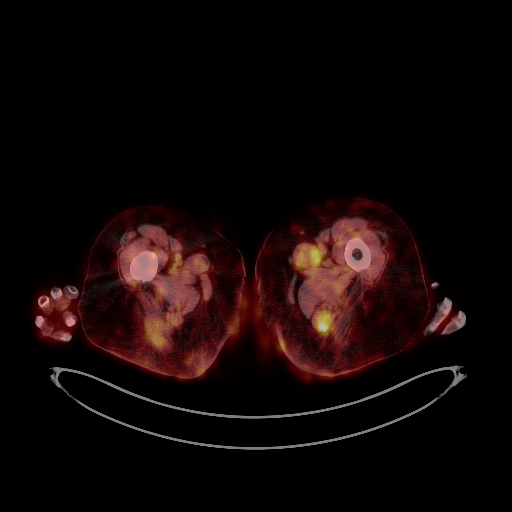

[Series 1081: results mm oncology reading · 1.12mm/px · 1 of 4 slices shown (1 of 2)]
[im 1/4]
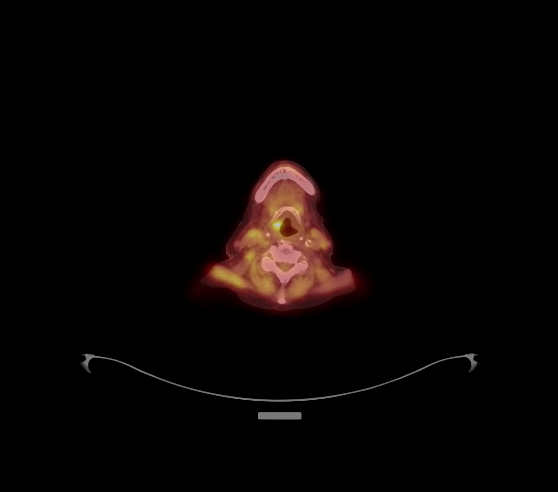

[Series 1193: results mm oncology reading · 5.0mm · 0.83mm/px · 1 of 2 slices shown (2 of 2)]
[im 1/2]
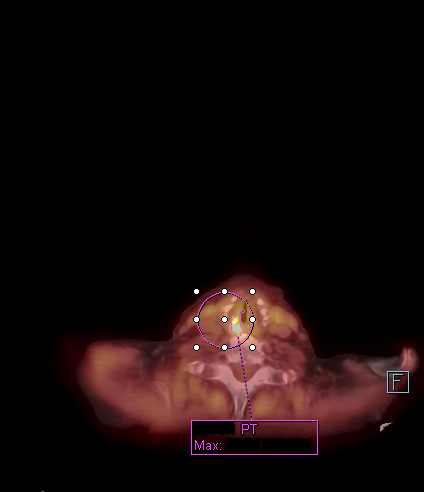

[25 of 25 positions shown; findings below may reference images not displayed]

FINDINGS: NECK

Residual hypermetabolism is seen along the right epiglottis,
extending inferiorly to the vocal cords, asymmetric on the right
with an SUV max of 12.1. No definite CT correlate. No hypermetabolic
lymph nodes. There may be a left mastoid effusion.

CHEST

New irregular airspace consolidation is seen in the apical segment
right upper lobe (CT image 10 of series 7) with mild associated
hypermetabolism. No hypermetabolic mediastinal, hilar or axillary
lymph nodes. Centrilobular emphysema. Atherosclerotic calcification
of the arterial vasculature. No pericardial or pleural effusion.

ABDOMEN/PELVIS

No abnormal hypermetabolism in the liver, adrenal glands, spleen or
pancreas. No hypermetabolic lymph nodes. Liver, adrenal glands,
kidneys, spleen and pancreas are grossly unremarkable.
Cholecystectomy. Percutaneous gastrostomy is in place.
Atherosclerotic calcification of the arterial vasculature without
abdominal aortic aneurysm.

SKELETON

No abnormal osseous hypermetabolism.  Right hip arthroplasty.
IMPRESSION: 1. Residual, but improved, hypermetabolism associated with the right
epiglottis, extending inferiorly to the vocal cords. No
hypermetabolic adenopathy.
2. New irregular airspace consolidation in the apical segment right
upper lobe, mildly hypermetabolic, likely infectious or inflammatory
in etiology. Attention on followup exams is warranted.
3.  Aortic atherosclerosis ([id number]-170.0).
